# Patient Record
Sex: Female | Born: 1960 | Race: Black or African American | Hispanic: No | Smoking: Former smoker
Health system: Southern US, Community
[De-identification: ages and names within clinical notes are randomized; demographics above are authoritative.]

## PROBLEM LIST (undated history)

## (undated) DIAGNOSIS — I5042 Chronic combined systolic (congestive) and diastolic (congestive) heart failure: Secondary | ICD-10-CM

## (undated) DIAGNOSIS — K219 Gastro-esophageal reflux disease without esophagitis: Secondary | ICD-10-CM

## (undated) DIAGNOSIS — E669 Obesity, unspecified: Secondary | ICD-10-CM

## (undated) DIAGNOSIS — D649 Anemia, unspecified: Principal | ICD-10-CM

## (undated) DIAGNOSIS — Z923 Personal history of irradiation: Secondary | ICD-10-CM

## (undated) DIAGNOSIS — I2510 Atherosclerotic heart disease of native coronary artery without angina pectoris: Secondary | ICD-10-CM

## (undated) DIAGNOSIS — I428 Other cardiomyopathies: Secondary | ICD-10-CM

## (undated) DIAGNOSIS — I219 Acute myocardial infarction, unspecified: Secondary | ICD-10-CM

## (undated) DIAGNOSIS — G4700 Insomnia, unspecified: Secondary | ICD-10-CM

## (undated) DIAGNOSIS — G8929 Other chronic pain: Secondary | ICD-10-CM

## (undated) DIAGNOSIS — I10 Essential (primary) hypertension: Secondary | ICD-10-CM

## (undated) DIAGNOSIS — R0602 Shortness of breath: Secondary | ICD-10-CM

## (undated) DIAGNOSIS — F329 Major depressive disorder, single episode, unspecified: Secondary | ICD-10-CM

## (undated) DIAGNOSIS — R42 Dizziness and giddiness: Secondary | ICD-10-CM

## (undated) DIAGNOSIS — R51 Headache: Secondary | ICD-10-CM

## (undated) DIAGNOSIS — Z8709 Personal history of other diseases of the respiratory system: Secondary | ICD-10-CM

## (undated) DIAGNOSIS — M1990 Unspecified osteoarthritis, unspecified site: Secondary | ICD-10-CM

## (undated) DIAGNOSIS — L309 Dermatitis, unspecified: Secondary | ICD-10-CM

## (undated) DIAGNOSIS — B029 Zoster without complications: Secondary | ICD-10-CM

## (undated) DIAGNOSIS — C50919 Malignant neoplasm of unspecified site of unspecified female breast: Principal | ICD-10-CM

## (undated) DIAGNOSIS — E785 Hyperlipidemia, unspecified: Secondary | ICD-10-CM

## (undated) DIAGNOSIS — J3081 Allergic rhinitis due to animal (cat) (dog) hair and dander: Secondary | ICD-10-CM

## (undated) DIAGNOSIS — M545 Low back pain, unspecified: Secondary | ICD-10-CM

## (undated) DIAGNOSIS — L509 Urticaria, unspecified: Secondary | ICD-10-CM

## (undated) DIAGNOSIS — Z9289 Personal history of other medical treatment: Secondary | ICD-10-CM

## (undated) DIAGNOSIS — F32A Depression, unspecified: Secondary | ICD-10-CM

## (undated) DIAGNOSIS — G709 Myoneural disorder, unspecified: Secondary | ICD-10-CM

## (undated) DIAGNOSIS — J439 Emphysema, unspecified: Secondary | ICD-10-CM

## (undated) DIAGNOSIS — R519 Headache, unspecified: Secondary | ICD-10-CM

## (undated) DIAGNOSIS — R079 Chest pain, unspecified: Secondary | ICD-10-CM

## (undated) DIAGNOSIS — I509 Heart failure, unspecified: Secondary | ICD-10-CM

## (undated) DIAGNOSIS — E039 Hypothyroidism, unspecified: Secondary | ICD-10-CM

## (undated) DIAGNOSIS — F419 Anxiety disorder, unspecified: Secondary | ICD-10-CM

## (undated) DIAGNOSIS — R011 Cardiac murmur, unspecified: Secondary | ICD-10-CM

## (undated) HISTORY — DX: Zoster without complications: B02.9

## (undated) HISTORY — DX: Chest pain, unspecified: R07.9

## (undated) HISTORY — DX: Essential (primary) hypertension: I10

## (undated) HISTORY — DX: Other cardiomyopathies: I42.8

## (undated) HISTORY — DX: Anxiety disorder, unspecified: F41.9

## (undated) HISTORY — DX: Heart failure, unspecified: I50.9

## (undated) HISTORY — DX: Emphysema, unspecified: J43.9

## (undated) HISTORY — DX: Insomnia, unspecified: G47.00

## (undated) HISTORY — DX: Chronic combined systolic (congestive) and diastolic (congestive) heart failure: I50.42

## (undated) HISTORY — DX: Hypothyroidism, unspecified: E03.9

## (undated) HISTORY — PX: BREAST BIOPSY: SHX20

## (undated) HISTORY — DX: Cardiac murmur, unspecified: R01.1

## (undated) HISTORY — DX: Urticaria, unspecified: L50.9

## (undated) HISTORY — DX: Depression, unspecified: F32.A

## (undated) HISTORY — DX: Anemia, unspecified: D64.9

## (undated) HISTORY — PX: ABDOMINAL HYSTERECTOMY: SHX81

## (undated) HISTORY — DX: Dermatitis, unspecified: L30.9

## (undated) HISTORY — DX: Atherosclerotic heart disease of native coronary artery without angina pectoris: I25.10

## (undated) HISTORY — DX: Malignant neoplasm of unspecified site of unspecified female breast: C50.919

## (undated) HISTORY — DX: Dizziness and giddiness: R42

## (undated) HISTORY — DX: Gastro-esophageal reflux disease without esophagitis: K21.9

## (undated) HISTORY — DX: Obesity, unspecified: E66.9

## (undated) HISTORY — DX: Major depressive disorder, single episode, unspecified: F32.9

---

## 1989-03-22 HISTORY — PX: TUBAL LIGATION: SHX77

## 1999-03-23 HISTORY — PX: CARDIAC CATHETERIZATION: SHX172

## 1999-09-09 ENCOUNTER — Inpatient Hospital Stay (HOSPITAL_COMMUNITY): Admission: EM | Admit: 1999-09-09 | Discharge: 1999-09-11 | Payer: Self-pay | Admitting: Emergency Medicine

## 1999-09-09 ENCOUNTER — Encounter: Payer: Self-pay | Admitting: Cardiology

## 1999-09-10 ENCOUNTER — Encounter: Payer: Self-pay | Admitting: *Deleted

## 2001-03-22 DIAGNOSIS — I219 Acute myocardial infarction, unspecified: Secondary | ICD-10-CM

## 2001-03-22 HISTORY — DX: Acute myocardial infarction, unspecified: I21.9

## 2002-12-31 ENCOUNTER — Encounter: Payer: Self-pay | Admitting: Emergency Medicine

## 2003-01-01 ENCOUNTER — Encounter: Payer: Self-pay | Admitting: Internal Medicine

## 2003-01-01 ENCOUNTER — Encounter: Payer: Self-pay | Admitting: Cardiology

## 2003-01-01 ENCOUNTER — Inpatient Hospital Stay (HOSPITAL_COMMUNITY): Admission: EM | Admit: 2003-01-01 | Discharge: 2003-01-05 | Payer: Self-pay | Admitting: Emergency Medicine

## 2003-01-02 ENCOUNTER — Encounter: Payer: Self-pay | Admitting: Internal Medicine

## 2003-01-03 ENCOUNTER — Encounter: Payer: Self-pay | Admitting: Internal Medicine

## 2003-04-10 ENCOUNTER — Ambulatory Visit (HOSPITAL_COMMUNITY): Admission: RE | Admit: 2003-04-10 | Discharge: 2003-04-10 | Payer: Self-pay | Admitting: Gastroenterology

## 2003-05-03 ENCOUNTER — Encounter: Admission: RE | Admit: 2003-05-03 | Discharge: 2003-05-03 | Payer: Self-pay | Admitting: Obstetrics and Gynecology

## 2004-01-07 ENCOUNTER — Emergency Department (HOSPITAL_COMMUNITY): Admission: EM | Admit: 2004-01-07 | Discharge: 2004-01-07 | Payer: Self-pay | Admitting: Emergency Medicine

## 2004-05-14 ENCOUNTER — Ambulatory Visit (HOSPITAL_COMMUNITY): Admission: RE | Admit: 2004-05-14 | Discharge: 2004-05-14 | Payer: Self-pay | Admitting: Gastroenterology

## 2004-06-11 ENCOUNTER — Ambulatory Visit: Payer: Self-pay | Admitting: Internal Medicine

## 2004-06-11 ENCOUNTER — Ambulatory Visit (HOSPITAL_BASED_OUTPATIENT_CLINIC_OR_DEPARTMENT_OTHER): Admission: RE | Admit: 2004-06-11 | Discharge: 2004-06-11 | Payer: Self-pay | Admitting: Gastroenterology

## 2004-08-06 ENCOUNTER — Encounter: Admission: RE | Admit: 2004-08-06 | Discharge: 2004-08-06 | Payer: Self-pay | Admitting: Internal Medicine

## 2004-11-12 ENCOUNTER — Emergency Department (HOSPITAL_COMMUNITY): Admission: EM | Admit: 2004-11-12 | Discharge: 2004-11-12 | Payer: Self-pay | Admitting: Emergency Medicine

## 2004-11-18 ENCOUNTER — Encounter: Admission: RE | Admit: 2004-11-18 | Discharge: 2004-11-18 | Payer: Self-pay | Admitting: Internal Medicine

## 2005-01-19 ENCOUNTER — Encounter (INDEPENDENT_AMBULATORY_CARE_PROVIDER_SITE_OTHER): Payer: Self-pay | Admitting: *Deleted

## 2005-01-19 ENCOUNTER — Inpatient Hospital Stay (HOSPITAL_COMMUNITY): Admission: RE | Admit: 2005-01-19 | Discharge: 2005-01-21 | Payer: Self-pay | Admitting: Obstetrics and Gynecology

## 2005-07-21 ENCOUNTER — Encounter: Admission: RE | Admit: 2005-07-21 | Discharge: 2005-07-21 | Payer: Self-pay | Admitting: Family Medicine

## 2005-07-29 ENCOUNTER — Encounter: Admission: RE | Admit: 2005-07-29 | Discharge: 2005-07-29 | Payer: Self-pay | Admitting: Family Medicine

## 2005-11-09 ENCOUNTER — Encounter: Admission: RE | Admit: 2005-11-09 | Discharge: 2005-11-09 | Payer: Self-pay | Admitting: Orthopedic Surgery

## 2005-11-12 ENCOUNTER — Encounter: Admission: RE | Admit: 2005-11-12 | Discharge: 2005-11-12 | Payer: Self-pay | Admitting: Orthopedic Surgery

## 2005-11-19 ENCOUNTER — Encounter: Admission: RE | Admit: 2005-11-19 | Discharge: 2005-11-19 | Payer: Self-pay | Admitting: Orthopedic Surgery

## 2005-12-06 ENCOUNTER — Encounter: Admission: RE | Admit: 2005-12-06 | Discharge: 2005-12-06 | Payer: Self-pay | Admitting: Orthopedic Surgery

## 2006-04-14 ENCOUNTER — Ambulatory Visit (HOSPITAL_COMMUNITY): Admission: RE | Admit: 2006-04-14 | Discharge: 2006-04-14 | Payer: Self-pay

## 2006-04-26 ENCOUNTER — Ambulatory Visit (HOSPITAL_COMMUNITY): Admission: RE | Admit: 2006-04-26 | Discharge: 2006-04-26 | Payer: Self-pay | Admitting: Cardiology

## 2007-04-10 ENCOUNTER — Encounter: Admission: RE | Admit: 2007-04-10 | Discharge: 2007-04-10 | Payer: Self-pay | Admitting: Family Medicine

## 2009-10-29 ENCOUNTER — Encounter: Admission: RE | Admit: 2009-10-29 | Discharge: 2009-10-29 | Payer: Self-pay | Admitting: Cardiology

## 2010-04-12 ENCOUNTER — Encounter: Payer: Self-pay | Admitting: Orthopedic Surgery

## 2010-08-07 NOTE — Op Note (Signed)
NAMEJANNELLE, [name]                   ACCOUNT NO.:  [id number]   MEDICAL RECORD NO.:  [id number]          PATIENT TYPE:  AMB   LOCATION:  ENDO                         FACILITY:  [facility]   PHYSICIAN:  Danise Edge, M.D.   DATE OF BIRTH:  January 28, 1961   DATE OF PROCEDURE:  05/14/2004  DATE OF DISCHARGE:                                 OPERATIVE REPORT   PROCEDURE INDICATIONS:  Ms. [name] is a 50 year old female born October 11, 1960. Ms. [name] is scheduled to undergo a screening colonoscopy with  polypectomy to prevent colon cancer.   ENDOSCOPIST:  Danise Edge, M.D.   PREMEDICATION:  Versed 9 mg, Demerol 50 mg.   PROCEDURE:  After obtaining informed consent, Ms. [name] was placed in the left  lateral decubitus position. I administered intravenous Demerol and  intravenous Versed to achieve conscious sedation for the procedure. The  patient's blood pressure, oxygen saturation and cardiac rhythm were  monitored throughout the procedure and documented in the medical record.   Anal inspection and digital rectal exam were normal. The Olympus adjustable  pediatric colonoscope was introduced into the rectum and advanced to the  cecum. Colonic preparation for the exam today was excellent.   Rectum normal.   Sigmoid colon and descending colon normal.   Splenic flexure normal.   Transverse colon normal.   Hepatic flexure normal.   Descending colon normal.   Cecum and ileocecal valve normal.   ASSESSMENT:  Normal screening proctocolonoscopy to the cecum.      MJ/MEDQ  D:  05/14/2004  T:  05/14/2004  Job:  [id number]   cc:   Georgann Housekeeper, MD  [address]  [place]  [place] [address]  Fax: [phone]

## 2010-08-07 NOTE — Discharge Summary (Signed)
[name]. [name]  Patient:    [name], [name]                            MRN: [id number] Adm. Date:  [id number] Disc. Date: [id number] Attending:  Darlin Priestly Dictator:   Darcella Gasman. Annie Paras, F.N.P.C. CC:         [name], M.D.  James L. Randa Evens, M.D.   Discharge Summary  DISCHARGE DIAGNOSES: 1. Chest pain secondary to #2.    a. Negative myocardial infarction.    b. No significant coronary artery disease.    c. No evidence of aortic dissection. 2. Streaky gastritis, positive Helicobacter pylori. 3. Left ventricular dysfunction moderately, ejection fraction 30 to 35%. 4. Hypertension. 5. Hypothyroidism. 6. Depression. 7. Iron-deficiency anemia. 8. Mitral valve regurgitation. 9. Hypercholesterolemia.  DISCHARGE CONDITION: Improved.  PROCEDURES: 1. September 10, 1999, combined left heart catheterization by Lenise Herald, M.D. 2. September 11, 1999, EGD by Fayrene Fearing L. Randa Evens, M.D.  DISCHARGE MEDICATIONS: 1. Maxzide 37.5/25 one a day. 2. Levothroid 50 mcg daily. 3. Nu-Iron 150 mg twice a day. 4. Micro-K 10 mEq daily. 5. Lanoxin 0.125 mg daily. 6. Altace 5 mg twice a day. 7. Lopressor 25 mg twice a day.  DISCHARGE INSTRUCTIONS: 1. No strenuous activity, no lifting or five pounds or driving x 2 days.    He may return to work Monday. 2. Low fat, low cholesterol, low salt diet. 3. He may shower. 4. Do not soak in the tub or swim for one week. 5. Call with any problems or questions. 6. See Dr. Jenne Campus in two weeks.  Call for an appointment. 7. See Dr. Randa Evens in three to four weeks. Call his office for an appointment.  HISTORY OF PRESENT ILLNESS:  A 50 year old black married female without prior cardiac history developed mid sternal chest pain on September 08, 1999, after becoming emotionally upset.  She went to bed after taking half a tablet of Zoloft and was awakened at 3 a.m. on September 09, 1999, with mid sternal chest pain.  She took a shower and felt as  if she would pass out.  She lay down on the floor and later that morning she went to Dr. Fayrene Fearing office in [place], [place]. She was given nitroglycerin with relief.  She continues with left arm pain.  She was sent to the emergency room with left arm pain. Nitroglycerin IV was started with resolution of her left arm pain.  PAST MEDICAL HISTORY:  Hypertension, depression, hypothyroidism, hypercholesterolemia and iron-deficiency anemia but no sickle cell disease.  PAST SURGICAL HISTORY:  BTL and C-sections.  ALLERGIES:  No known drug allergies.  OUTPATIENT MEDICATIONS:  Maxzide, levothyroxine and Zoloft p.r.n.  For family history, social history and review of systems, see H&P.  DISCHARGE PHYSICAL EXAMINATION:  GENERAL APPEARANCE:  An alert and oriented black female in no acute distress.  VITAL SIGNS:  Blood pressure 98/58, pulse 76, respiratory rate 20, temperature 97.9, room air oxygen saturation 93%.  SKIN:  Warm and dry with brisk capillary refill.  LUNGS:  Clear to auscultation.  CARDIOVASCULAR:  Regular rate and rhythm.  EXTREMITIES:  Her right groin was stable.  LABORATORY DATA:  Admission labs revealed hemoglobin 7.8, hematocrit 27.1, wbc 3.6, MCV 63.8, platelets 401.  After two units of packed cells, hemoglobin was up to 10.8, hematocrit 34.5 and wbc 7.1; neutrophils 15, lymphs 37, monos 5, eosinophils 5, basophils 2, leukocytes 2.  Protime 13,  INR 1 and PTT 24.  Chemistries revealed sodium 133, potassium on admission 2.8, chloride 99, CO2 27, glucose 94, BUN 11, creatinine 0.8, calcium 9.4, total protein 7.8, albumin 3.8, AST 24, ALT 19, ALP 52, total bilirubin 0.7, magnesium 2.  Prior to discharge her potassium had been up to 4 and it was down to 3.3 on the day of discharge but she was given more supplement.  CKs ranged 181 to 161, MB 0.6 to 0.3, troponin 0.03.  Total cholesterol 195, triglycerides 84, HDL 39, LDL 139.  Iron 15, total iron binding capacity  515, iron saturation 3, B12 682, folate 10, ferritin 3.  Blood type is O positive.  H. pylori:  Urease negative, CLOtest detects urease enzyme with Helicobacter pylori in the gastric mucosa.  Chest x-ray on admission revealed lungs are clear, no acute disease. Follow-up VQ scan showed very low likelihood for PE.  EKG showed sinus rhythm with PACs, nonspecific T-wave abnormalities, no significant change from previous EKG.  September 10, 1999, sinus rhythm, occasional PAC, prolonged QT slight, otherwise normal EKG.  EGD revealed some gastritis and H. pylori was positive.  Cardiac catheterization revealed patent coronary arteries with minimal disease.  See catheterization report for complete details.  EF was down to 30 to 35% and there was mild mitral regurgitation.  There was no evidence of renal artery stenosis and normal aorta.  HOSPITAL COURSE:  [name] was admitted September 09, 1999, by Dr. Jenne Campus for chest pain.  She was brought in and placed on IV heparin, IV nitroglycerin and underwent cardiac catheterization.  Her hemoglobin was low at 7.8. She was transfused two units of packed cells.  Cardiac catheterization showed minimal coronary artery disease, some mitral regurgitation and some left ventricular dysfunction.  VQ scan was negative for PE.  She underwent GI consult with Dr. Randa Evens and an EGD which was positive for H. pylori. She was started on iron and discharged home on September 11, 1999.  FOLLOW-UP:  She will follow up with Dr. Randa Evens and Dr. Jenne Campus. DD:  11/30/99 TD:  12/01/99 Job: [id number]

## 2010-08-07 NOTE — H&P (Signed)
[name], [name]                               ACCOUNT NO.:  [id number]   MEDICAL RECORD NO.:  [id number]                   PATIENT TYPE:  EMS   LOCATION:  ED                                   FACILITY:  [facility]   PHYSICIAN:  Corinna L. Lendell Caprice, MD             DATE OF BIRTH:  [birth year]   DATE OF ADMISSION:  01/01/2003  DATE OF DISCHARGE:                                HISTORY & PHYSICAL   CHIEF COMPLAINT:  1. Stomach hurts.  2. Shortness of breath.   HISTORY OF PRESENT ILLNESS:  [name] is a 50 year old black female patient  of Dr. Fayrene Fearing in [place], who presents to the emergency room with several  complaints, the first of which is constipation for two weeks.  The pain has  gotten progressively worse since then.  She is also bloated.  She had one  episode of vomiting yesterday.  She has no history of hematemesis.  The  second complaint is dyspnea.  She also has chronic orthopnea, two pillows;  this may have been worsened over the past two days.  Also, her legs have  been swollen for two days.  She thinks this may have been due to eating ice  cream; she apparently is allergic to this and had hives two days ago.  She  has had chest pain lasting almost all day today, like a knot in her chest  in the substernal area.  She had a cardiac catheterization done in 2001 with  no significant coronary artery disease but moderate global hypokinesis with  an ejection fraction of 30-35% and mild mitral regurgitation.  The patient  denies history of congestive heart failure.   PAST MEDICAL HISTORY:  1. Hypertension.  2. Hypothyroidism.  3. Depression.  4. Iron deficiency anemia.  5. She had an EGD in 2001 which showed gastritis.   MEDICATIONS:  Norvasc, Synthroid, Paxil and Zoloft, doses all unknown.  She  may take a few other medications which she cannot recall.   PAST SURGICAL HISTORY:  C-section.   ALLERGIES:  She is allergic to PEANUTS and ICE CREAM.   SOCIAL HISTORY:  She smokes six  to eight cigarettes a day.  She is separated  from her husband.  She works as a Airline pilot person.  She drinks a wine cooler  rarely, no drugs.   FAMILY HISTORY:  Her mother died of leukemia.  Her father is on dialysis and  has hypertension.   REVIEW OF SYSTEMS:  CONSTITUTIONAL:  No fevers or chills.  HEENT:  No  headache.  No odynophagia.  No sore throat.  RESPIRATORY:  As above.  She  also has had a dry cough recently.  CARDIOVASCULAR:  As above.  GI:  As  above.  GU:  No dysuria or hematuria.  MUSCULOSKELETAL:  She is experiencing  pain in her legs.  SKIN:  As above.  She went  to the emergency room and was  given Benadryl and steroids for her rash that broke out on her legs after  eating ice cream; this is resolving.  ENDOCRINE:  No diabetes.  She has not  had her TSH checked recently, as far as she can recall.  HEMATOLOGIC:  No  history of PE or DVT.   PHYSICAL EXAMINATION:  VITAL SIGNS:  On physical examination, her oxygen  saturation is 95% on room air, blood pressure is 120/88, heart rate 106,  respiratory rate 20, temperature 98.5.  GENERAL:  In general, the patient is an uncomfortable-appearing black  female.  HEENT:  Normocephalic, atraumatic.  Pupils are equal, round and reactive to  light.  Sclerae nonicteric.  She has moist mucous membranes.  NECK:  Neck is supple.  No JVD.  LUNGS:  Lungs are clear to auscultation bilaterally without wheezes, rhonchi  or rales.  CARDIOVASCULAR:  Fast, regular.  No murmurs, gallops or rubs.  ABDOMEN:  Normal bowel sounds.  Soft.  She does have distention and diffuse  tenderness.  No rebound tenderness.  GU:  Deferred.  RECTAL:  Heme-negative per ER physician.  EXTREMITIES:  She has 1+ pitting edema.  NEUROLOGIC:  The patient is alert and oriented.  Cranial nerves and  sensorimotor exam are intact.  SKIN:  She has faint papular rash which appears to be resolving hives on her  right leg greater than her left.  PSYCHIATRIC:  Normal  affect.   LABORATORIES:  White blood cell count is 8.4, hemoglobin 7.4, hematocrit  26.0, MCV is 62, RDW is 25, platelet count is 335,000.  Complete metabolic  panel is negative for a glucose of 114, SGOT of 120, SGPT of 143, albumin of  3.0, otherwise, unremarkable.  Total CPK is 43, MB fraction is 1.7 with a  troponin of 0.08.  Urine pregnancy negative.  UA shows greater than 300  protein, negative nitrites, negative leukocyte esterase, negative blood.   EKG shows sinus tachycardia with a rate of 106 and nonspecific changes.   Acute abdominal series is a poor film.  I am unable to see the lung apices  and it is over-penetrated, but it looks like she may have an early ileus  versus early small-bowel obstruction and also it looks like she may have  congestive heart failure.   ASSESSMENT AND PLAN:  1. New-onset congestive heart failure:  The patient has had a nonischemic     cardiomyopathy, based on cardiac catheterization in 2001, but no reported     overt congestive heart failure.  I will check an echocardiogram, thyroid-     stimulating hormone, two more sets of cardiac enzymes.  She has received     a dose of intravenous Lasix here in the emergency room; I will continue     this; give aspirin, Nitrol paste, acetylcholinesterase inhibitor and     admit to telemetry.  2. Probable early small-bowel obstruction versus ileus:  The patient will be     nothing-by-mouth (n.p.o.) except for ice chips.  I will give Dulcolax, as     it sounds like she has been constipated for some time and this may be     exacerbated by that.  If she has any vomiting or if the obstruction or     ileus does not improve, she may need an nasogastric tube to suction.  I     will also check a lipase to rule out pancreatitis.  3. Iron deficiency anemia:  Apparently,  this has been worked up previously.     She will get blood transfusion, given her heart failure. 4. Increased liver function tests, probably due to  passive hepatic     congestion.  This will be monitored and if it does not improve, may need     workup.  5. Hypothyroidism:  This may be contributing to her number one and number     two.  We will check a thyroid-stimulating hormone.  6. Hypertension.  7.     Depression:  We will need to get medication information regarding her exact      medications and doses.  I have asked her friend to bring these in.     Otherwise, if this does not occur, she reports that she goes to the [facility] in [place].                                               Corinna L. Lendell Caprice, MD    CLS/MEDQ  D:  01/01/2003  T:  01/01/2003  Job:  [id number]

## 2010-08-07 NOTE — H&P (Signed)
NAMEAMELIYAH, [name]                   ACCOUNT NO.:  [id number]   MEDICAL RECORD NO.:  [id number]          PATIENT TYPE:  AMB   LOCATION:                                FACILITY:  [facility]   PHYSICIAN:  Charles A. Delcambre, MDDATE OF BIRTH:  23-Jul-1960   DATE OF ADMISSION:  04/14/2006  DATE OF DISCHARGE:  04/14/2006                              HISTORY & PHYSICAL   The patient to be admitted on April 14, 2006, to undergo wide local  excision of a vulvar lesion.  This has been present for several months.  It failed hydrocortisone, failed Condylox but she only used this for a  very brief period of time - actually 1day - and it is tender.  There is  no evidence of infection that I can tell on examination.  It is mildly  tender to touch.  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  It is approximately 1.5 x 0.5 x 0.5 cm and it is bigger  than I would want to do in the office with a bit of a margin, and for  this region she is to be admitted to undergo wide local excision.  She  gives informed consent; accepts risks of infection, bleeding, cutaneous  nerve involvement.  All questions are answered and will proceed as  outlined.  Preoperative CBC, BMET, and n.p.o. past 0700 with an  afternoon surgery at approximately 3 p.m. is noted.   PAST MEDICAL HISTORY:  1. Hypertension.  2. Hypothyroidism.  3. Nerves.   SURGICAL HISTORY:  1. C-section x3, tubal ligation at the last surgery.  2. TAH/RSO.  Left ovary remains in situ.   MEDICATIONS:  1. Wellbutrin 150 mg daily.  2. Lasix 40 mg b.i.d.  3. Potassium 20 mEq daily.  4. Levothroid 250 mcg daily.  5. Altace 10 mg daily.  6. Diclofenac 75 mg daily.  7. Nexium 40 mg daily.  8. Phendimetrazine 150 mg daily through a diet doctor.   ALLERGIES:  PENICILLIN - hives.   SOCIAL HISTORY:  No alcohol or drug use.  She is not married and not  sexually active.  She does smoke, about one pack of cigarettes daily.   FAMILY HISTORY:   Denies family history of gynecological cancers or major  illnesses contributing.   REVIEW OF SYSTEMS:  No chest pain, shortness of breath, wheezing,  diarrhea, constipation, urgency, dysuria, headaches.   PHYSICAL EXAMINATION:  GENERAL:  Alert and oriented x3, no distress.  VITAL SIGNS:  Blood pressure 100/70, heart rate 72, respirations 20.  HEENT:  Grossly within normal limits.  NECK:  Supple without thyromegaly or adenopathy.  LUNGS:  Clear bilaterally.  HEART:  Regular rate and rhythm without murmur, rub or gallop.  BREASTS:  Deferred.  ABDOMEN:  No masses palpable.  PELVIC:  Normal external female genitalia as far as architecture.  In  the left groin fold posteriorly there is a 1.5- to 2-cm lesion raised  about 0.5 cm and about 0.5 cm in width as noted in the history of  present illness, consistent with biopsy site.  Rectal exam is not done.  Anus and perineal body appear normal.   ASSESSMENT:  Vulvar lesion.   PLAN:  Wide local excision.  Plan outlined as noted above.      Charles A. Sydnee Cabal, MD  Electronically Signed     CAD/MEDQ  D:  04/05/2006  T:  04/05/2006  Job:  [id number]

## 2010-08-07 NOTE — Procedures (Signed)
[place]  Patient:    [name], [name]                            MRN: [id number] Proc. Date: 09/11/99 Adm. Date:  [id number] Disc. Date: [id number] Attending:  Lenise Herald H CC:         Lenise Herald, M.D.             Andres Ege, M.D. [place], [place]                           Procedure Report  PROCEDURE:  Esophagogastroduodenoscopy.  MEDICATIONS:  Hurricaine spray, Fentanyl 70 mcg, Versed 6 mg IV.  ENDOSCOPIST: Llana Aliment. Randa Evens, M.D.  INDICATIONS:  Profound iron-deficiency anemia with atypical chest pain. This procedure is done in an attempt to further evaluate the anemia, as well as to look for upper GI source of her chest pain.  DESCRIPTION OF PROCEDURE:  The procedure was explained to the patient and consent was obtained. The patient was placed in the left lateral decubitus position. The Olympus video endoscope was inserted into the esophagus under direct visualization. The stomach and pylorus was identified and passed. The duodenum, including the bulb and the second portion were seen well and unremarkable. No ulceration or inflammation. The scope was withdrawn back into the stomach. The antrum revealed diffuse streaky gastritis. The fundus and cardia seen on retroflex view. There were no ulcerations. No other significant abnormalities. Biopsy of the antrum was sent for rapid urease test for Helicobacter. The scope was withdrawn back into the esophagus. The GE junction was patent. The distal and proximal esophagus were endoscopically normal. There is no ulceration or any other significant inflammation. The scope was withdrawn and the patient tolerated the procedure well.  ASSESSMENT: 1. Streaky gastritis, otherwise normal endoscopy.  PLAN: 1. We will check results of test for Helicobacter and if positive, will treat    aggressively. The presence of Helicobacter has been associated with marked    impairment of iron absorption. 2.  Aggressive iron replacement orally. 3. Would have the patient see me in the office in three to four weeks to    arrange an outpatient colonoscopy. 4. We will start Nexium one tablet daily upon discharge. DD:  09/11/99 TD:  09/15/99 Job: [id number]

## 2010-08-07 NOTE — Consult Note (Signed)
[name], [name]                               ACCOUNT NO.:  [id number]   MEDICAL RECORD NO.:  [id number]                   PATIENT TYPE:  INP   LOCATION:  0374                                 FACILITY:  [facility]   PHYSICIAN:  Sharlet Salina T. Hoxworth, M.D.          DATE OF BIRTH:  03-24-1960   DATE OF CONSULTATION:  01/01/2003  DATE OF DISCHARGE:                                   CONSULTATION   CHIEF COMPLAINT:  Abdominal pain.   HISTORY OF PRESENT ILLNESS:  I was asked by Georgann Housekeeper, M.D. to evaluate  [name].  She is a 50 year old black female who was admitted yesterday  evening to [facility] with complaints of abdominal pain and  shortness of breath.  Ms. Hamme states that approximately three to four days  prior to this admission she developed the onset of epigastric abdominal  pain.  She describes a pressure like pain felt in her epigastrium without  radiation.  This came on fairly quickly.  The pain has persisted since that  time.  She also has had some shortness of breath over the same period.  She  has been somewhat nauseated, but without vomiting over the last several  days.  She did have an episode of vomiting about a week ago prior to the  onset of the pain.  She has not had any fever or chills.  No cough.  She  denies any urinary burning, frequency.  She has felt constipated over about  the past week and generally bloated.  She has had small bowel movements and  has noted a little bit of blood on a couple occasions with straining.  She  also has noticed some increased swelling of her ankles.  She apparently had  some itching and hives about two days ago after the onset of the pain and  was seen in emergency room at [facility] and was treated with Benadryl  and some laxatives.  Her pain, however, continue in her epigastrium which  was her chief problem and she presented to [facility].   PAST SURGICAL HISTORY:  Cesarean section.   PAST MEDICAL  HISTORY:  1. She had a cardiac catheterization in 2001 for chest pain with normal     coronaries, but global hypokinesia with an ejection fraction of about     35%.  2. She is followed for hypertension.  3. Hypothyroidism.  4. Depression.  5. History of anemia.  6. Gastritis.   MEDICATIONS:  She is not certain of dosage, but apparently takes Norvasc,  Zestril, Lasix, Synthroid, and Zoloft.   ALLERGIES:  No drug allergies but she states she is allergic to PEANUTS and  ICE CREAM.   SOCIAL HISTORY:  She is separated.  Smokes occasional cigarettes; drinks  alcohol rarely.  She is employed.   FAMILY HISTORY:  Positive for leukemia, renal failure, hypertension in her  parents.  REVIEW OF SYSTEMS:  GENERAL:  Denies fever, chills, weight change.  RESPIRATORY:  She has had some increasing shortness of breath as above over  the past week, also nonproductive cough.  CARDIOVASCULAR:  Denies chest  pain, palpitations.  GASTROINTESTINAL:  As above.  EXTREMITIES:  She has  noted some increased swelling of her lower extremities.   PHYSICAL EXAMINATION:  VITAL SIGNS:  She is afebrile.  Pulse is 100 and  regular, blood pressure 109/72, respirations 18.  GENERAL:  She is a mildly obese black female who appears uncomfortable, but  not severely ill.  SKIN:  Warm and dry.  No rash or infection.  HEENT:  Eyes:  Sclerae not icteric.  Pupils equal and reactive.  Oropharynx  clear without exudates.  NECK:  No palpable masses or thyromegaly.  LYMPH NODES:  No cervical, supraclavicular, axillary nodes palpable.  LUNGS:  Clear to auscultation.  CARDIAC:  Regular tachycardia.  I did not hear any murmurs.  There is 1+  ankle edema.  ABDOMEN:  Mild distention.  Bowel sounds are present.  There is a healed low  midline incision without hernias.  She is quite tender in the epigastrium  and right upper quadrant with guarding.  I cannot feel any masses or  hepatosplenomegaly.  EXTREMITIES:  No joint  swelling or deformity.  NEUROLOGIC:  Alert, fully oriented.  Motor, sensory examination is grossly  normal.   LABORATORIES:  White count 8.4, hemoglobin 9.6.  Electrolytes, BUN,  creatinine normal.  LFTs show moderate elevation of AST and ALT at 120 and  143, respectively.  Bilirubin and alkaline phosphatase normal.  Troponins  mildly elevated at 0.06-0.08.  CPK-MBs negative.  Urinalysis negative except  for greater than 300% protein.   Flat and upright abdomen and chest x-ray were obtained.  Abdominal films  were unremarkable with normal bowel gas pattern.  There may be some slight  interstitial edema on chest x-ray.  CT scan of the abdomen and pelvis was  obtained.  The most notable finding is a prominent gallbladder with some  surrounding fluid.  No definite stones.  There are small bilateral effusions  and moderate cardiomegaly.  There is some pelvic fluid and small ovarian  cysts.   ASSESSMENT/PLAN:  A 50 year old black female with acute epigastric abdominal  pain of three to four days' duration.  The location of her pain, tenderness,  moderately elevated LFTs, and CT scan all suggest the possibility of acute  cholecystitis.  She appears to have some possible mild congestive failure  and history of hypokinesis on cardiac catheterization.  I cannot rule out  passive congestion of the liver as a cause for her symptoms.  The patient is  being seen by cardiology.  She has a HIDA scan ordered and I agree this  should help sort this problem out.  If the patient does require  cholecystectomy, she would certainly require a cardiac clearance prior to  general anesthesia.   Will follow with you.                                               Lorne Skeens. Hoxworth, M.D.    Tory Emerald  D:  01/01/2003  T:  01/01/2003  Job:  [id number]

## 2010-08-07 NOTE — H&P (Signed)
[name], [name]                   ACCOUNT NO.:  [id number]   MEDICAL RECORD NO.:  [id number]           PATIENT TYPE:   LOCATION:                                 FACILITY:   PHYSICIAN:  Charles A. Sydnee Cabal, MD    DATE OF BIRTH:   DATE OF ADMISSION:  01/19/2005  DATE OF DISCHARGE:                                HISTORY & PHYSICAL   CHIEF COMPLAINT:  Menorrhagia.   HISTORY OF PRESENT ILLNESS:  This 50 year old para 3, 0, 0, 3, status post  cesarean section x3, tubal ligation, with menorrhagia and hemoglobin down to  the 9.1 range, is tolerating some iron to build up in the upper 9 range. She  was to be scheduled for endometrial ablation, however, sonohysterogram  revealed that she had a very thin scar and for this reason contraindication  to ablation was noted. She now wishes to go one with hysterectomy secondary  to continued ongoing bleeding, failing progestin therapy and suppression of  her cycling.   PAST MEDICAL HISTORY:  1.  Hypertension.  2.  Hypothyroidism.  3.  Nerves.   PAST SURGICAL HISTORY:  C-section x3, tubal ligation at the last surgery.   MEDICATIONS:  1.  Wellbutrin 150 mg daily.  2.  Lasix 40 mg b.i.d.  3.  Potassium 20 daily.  4.  Levothroid 250 mcg daily.  5.  Altace 10 mg daily.  6.  Diclofenac 75 mg daily.  7.  Iron pill 2 daily.  8.  Nexium 40 mg daily.  9.  Phendimetrazine 150 mg daily (through a diet doctor).   ALLERGIES:  PENICILLIN, questionable reaction causing hives.   SOCIAL HISTORY:  She smokes about a pack of cigarettes daily. No alcohol or  drug use or STD exposure in the past. She is not married. Not sexually  active.   FAMILY HISTORY:  Denies family history of gynecological cancers. No major  illnesses.   REVIEW OF SYSTEMS:  She feels weak when her iron gets down, otherwise denies  fever, chills, nausea, vomiting, diarrhea, constipation, headaches, chest  pain, shortness of breath, wheezing, abdominal pain, bleeding, melena,  hematochezia, urgency, frequency, dysuria, incontinence, hematuria,  galactorrhea or emotional changes.   PHYSICAL EXAMINATION:  GENERAL:  Alert and oriented x3.  VITAL SIGNS:  Blood pressure 150/90, pulse 80, respirations 18.  HEENT EXAM:  Grossly within normal limits.  NECK:  Supple without thyromegaly or adenopathy.  LUNGS:  Clear bilaterally.  BACK:  No CVA tenderness, vertebral column nontender to palpation.  HEART:  Regular rate and rhythm without murmur, rub, gallop.  BREASTS:  No masses, tenderness, discharge. Skin with changes bilaterally.  ABDOMEN:  Soft, flat, nontender, no hepatosplenomegaly or other masses  noted. No hernias.  LYMPH NODES:  No supraclavicular, infraclavicular, groin, axillary,  cervical, postauricular or submandibular nodes are palpable.  PELVIC:  Normal external female genitalia. BUS within normal limits.  Ureteral meatus normal, urethra normal. Bladder normal. Vagina with slight  amount of dark mucoid material consistent with spotting is present. Cervix  multiparous, no cervical motion tenderness is present. Pap was normal.  On  bimanual examination uterus is 8-10 weeks size, slightly irregular,  consistent with small leiomyomata. Slightly deviated over to the left,  immobile and nontender. Adnexal regions are nontender without masses  bilaterally.  I could not palpate the ovaries secondary to patient's body habitus, no  masses are palpable.  RECTAL EXAM:  Was not done. External hemorrhoids are not present. Anus and  perineal body appeared normal.   ASSESSMENT:  Menorrhagia unresponsive to progestins and patient not a  candidate for ablation, now wishing to go on with definitive therapy via  hysterectomy as last resort with conservation of ovaries. She gives informed  consent and accepts risks of infection, bleeding, bowel or bladder damage,  blood product risk including hepatitis and TB exposure, ureteral damage,  incision infection, DVT, general  anesthetic risks. All questions are  answered. She gives informed consent. We will obtain a preoperative CBC,  chemistry profile, type and screen. EKG per anesthesia. T.E.D.'s will be  used peri operatively.  Since she is allergic to penicillin, prophylaxis  with clindamycin 900 mg plus gentamycin 0.5 mg/kg or 120 mg, whichever is  less, will be given preoperatively. All questions were answered and we will  proceed as outlined. She will take her morning medications but sip of water  and otherwise remain n.p.o. past midnight.  We will proceed as outlined.      Charles A. Sydnee Cabal, MD  Electronically Signed     CAD/MEDQ  D:  12/30/2004  T:  12/30/2004  Job:  [id number]

## 2010-08-07 NOTE — Procedures (Signed)
NAMEAISHA, [name]                   ACCOUNT NO.:  [id number]   MEDICAL RECORD NO.:  [id number]          PATIENT TYPE:  OUT   LOCATION:  SLEEP CENTER                 FACILITY:  [facility]   PHYSICIAN:  Clinton D. Maple Hudson, M.D. DATE OF BIRTH:  Jun 21, 1960   DATE OF STUDY:  06/11/2004                              NOCTURNAL POLYSOMNOGRAM   REFERRING PHYSICIAN:  Dr. Corliss Marcus.   INDICATIONS FOR STUDY:  Hypersomnia with sleep apnea. Epworth sleepiness  score 10/24, BMI 38, weight 178 pounds. This study was keyed in by the  admissions office as being requested by Dr. Carman Ching. The available  medical records are from Dr. Corliss Marcus referring to management of  cardiomyopathy and specifically plans to arrange a sleep study. The report  will be directed to Dr. Amil Amen for his direction and re-direction if  appropriate.   SLEEP ARCHITECTURE:  Total sleep time 426 minutes with sleep efficiency of  82%. Stage I was 11%, stage II was 71%, stages III and IV were 10%, REM was  8% of total sleep time. Sleep latency was 25 minutes. REM latency 207  minutes. Awake after sleep onset 65 minutes. Arousal index increased at 76.  She took Paxil at h.s., but no other medications.   RESPIRATORY DATA:  Respiratory disturbance index (RDI) 1.8 obstructive  events per hour which is within normal limits. There were a total of 13  hypopneas. Most events were reported partially sleeping on her back or left  side. REM RDI was 0.   OXYGEN DATA:  Mild snoring with oxygen desaturation to a nadir of 91%. Mean  oxygenation through the study was 96% on room air.   CARDIAC DATA:  Normal sinus rhythm with occasional PVC.   MOVEMENT/PARASOMNIA:  A total of 192 limb jerks were recorded of which 51  were associated with arousal or awakening for a periodic limb movement with  arousal index of 7.2 per hour which is increased. Bathroom times one.   IMPRESSION/RECOMMENDATIONS:  1.  Occasional sleep disordered breathing  events, hypopneas, RDI of 1.8 per      hour  which is within normal limits. No specific therapy indicated.  2.  Periodic limb movement with arousal, 7.2 per hour. This might be enough      to justify of trial of therapy with      either clonazepam or Requip at bedtime if clinically appropriate.      Recognize that some antidepressants are associated with increased      periodic limb movement and restless leg complaints.      CDY/MEDQ  D:  06/14/2004 13:26:35  T:  06/15/2004 10:54:14  Job:  [id number]

## 2010-08-07 NOTE — Consult Note (Signed)
[name], [name]                               ACCOUNT NO.:  [id number]   MEDICAL RECORD NO.:  [id number]                   PATIENT TYPE:  INP   LOCATION:  0374                                 FACILITY:  [facility]   PHYSICIAN:  Colleen Can. Deborah Chalk, M.D.            DATE OF BIRTH:  08/24/1960   DATE OF CONSULTATION:  DATE OF DISCHARGE:                                   CONSULTATION   HISTORY:  Ms. Ditto is a 50 year old black female with multiple medical  problems who presents with nausea, vomiting, constipation as well as  shortness of breath. She has known LV dysfunction for a catheterization in  2001 when she had normal coronary arteries and moderate left ventricular  dysfunction. She has had no cardiology followup. She has a three day history  of chest tightness with increasing dyspnea and cough, increasing edema,  bloating and noted to have some degree of congestive heart failure as well  as ileus and anemia on presentation.   PAST MEDICAL HISTORY:  She had catheterization in 2001, minimal to no  coronary disease with an EF of 30-35%, she has had iron deficiency anemia,  hypertension, tobacco abuse which is ongoing, hypothyroidism, depression, a  history of cesarean section.   ALLERGIES:  No known drug allergies.   MEDICATIONS:  1. Aspirin.  2. Zestril 2.5 mg daily.  3. Lasix 40 mg.  4. Synthroid.   FAMILY HISTORY:  Father is on dialysis and living, mother died of leukemia.   SOCIAL HISTORY:  She is a smoker, separated, employed as a Immunologist, rare  alcohol.   REVIEW OF SYMPTOMS:  She has had no prior chest pain or shortness of breath  until three days prior to admission. No syncope, no fever, feels cold. She  has had a cough, she has had abdominal tenderness as well as edema.   PHYSICAL EXAMINATION:  GENERAL:  She does have jugular venous distention.  She is short of breath but talking.  VITAL SIGNS:  Blood pressure 109/72, heart rate 102, respiratory rate 20, O2  saturations 100%.  SKIN:  Warm and dry, color is normal.  LUNGS:  Show faint rales at the bases.  HEART:  Shows tachycardia. There is a gallop cadence but not really an S3.  No murmur.  ABDOMEN:  Tender and somewhat distended.  EXTREMITIES:  With edema.  NEUROLOGIC:  No gross defects.   LABORATORY DATA:  On admission, hematocrit was 26 and increased recently to  30 post transfusion. Cardiac enzymes were negative, B peptide is 693, TSH is  0.94. Chemistries were basically normal. Glucose is 114. EKG shows sinus  tachycardia with nonspecific ST and T wave changes. Chest x-ray showed  interstitial edema.    IMPRESSION:  1. Some degree of congestive heart failure in setting of known LV     dysfunction.  2. Ileus.  3. Anemia improved with transfusions.  4.  Minimal coronary disease.  5. Hypertension, controlled.  6. History of ongoing tobacco abuse.   PLAN:  2-D echo was performed which showed left ventricular dysfunction with  moderate degree of mitral regurgitation and moderate tricuspid  regurgitation. There is left atrial enlargement. Will try to increase the  diuretics. She will need to be treated for heart failure, will consider low  dose beta blocker in addition to ACE inhibitor.                                               Colleen Can. Deborah Chalk, M.D.    SNT/MEDQ  D:  01/01/2003  T:  01/01/2003  Job:  [id number]   cc:   Kizzie Furnish, M.D.  [address]  [place]  [place] [address]  Fax: [phone]   Janae Bridgeman. Eloise Harman., M.D.  [address]  [place]  [place] [address]  Fax: [phone]   Georgann Housekeeper, M.D.  [address]  [place]  [place] [address]  Fax: [phone]   Lorne Skeens. Hoxworth, M.D.  [address]  [place]  [place] [address]  Fax: [phone]

## 2010-08-07 NOTE — Cardiovascular Report (Signed)
[facility]  Patient:    [name], [name]                            MRN: [id number] Proc. Date: 09/10/99 Adm. Date:  [id number] Disc. Date: [id number] Attending:  Darlin Priestly CC:         Kizzie Furnish, M.D.                        Cardiac Catheterization  PROCEDURES: 1. Left heart catheterization. 2. Coronary angiography. 3. Left ventriculogram. 4. Ascending aortography. 5. Descending aortogram.  SURGEON:  Lenise Herald, M.D.  COMPLICATIONS:  None.  INDICATION:  Ms. Colquhoun is a 50 year old black female, patient of Dr. Delories Heinz in [place], [place], with a history of hypertension and hypercholesterolemia, as well as tobacco abuse, and positive family history of of a CAD.  The patient presented to Dr. Fayrene Fearing office on September 09, 1999 complaining of substernal chest and left arm pain which originally started on the evening of the 19th, progressed through the 9th, and became more severe at approximately 3 a.m. on September 09, 1999.  She was seen by Dr. Fayrene Fearing with diffuse T-wave changes which was unchanged from prior EKGs and given sublingual nitroglycerin with relief for symptoms.  She is now referred to [facility] for further evaluation of her chest pain.  DESCRIPTION OF PROCEDURE:  After giving informed consent, the patient was brought to the cardiac catheter lab where right and left groin was shaved, prepped and draped in the usual sterile fashion.  ECG monitoring was established.  Using modified Seldinger technique, a #6 arterial sheath was inserted in the right femoral artery.  A 6 French diagnostic catheter was used to perform diagnostic angiography.  This reveals a large left main with no significant disease.  The LAD is a large vessel which coursed to the apex and gave rise to one diagonal branch.  The LAD has no significant disease.  The first diagonal was a medium sized vessel which bifurcates distally and has no  significant disease.  The left circumflex is a medium size vessel which coursed in the AV groove and gave rise to three to obtuse marginal branches.  The AV circumflex has no significant disease.  The first OM is a medium size vessel with no significant disease.  The second OM is a medium size vessel with no significant disease. The third OM is a large vessel which bifurcates in the proximal portion and has no significant disease.  The right coronary artery is a large vessel which is dominant and gives rise to both PDA as well as posterior lateral branch.  The RCA is noted to have a distal 20% stenotic lesion with no further significant stenosis.  The PDA and posterior lateral branch has no significant disease.  The left ventriculogram reveals moderate global hypokinesis with an estimated EF of 30-35%.  There is mild mitral regurgitation.  Ascending aortography reveals no evidence of aortic insufficiency and no evidence of aortic dissection.  Descending aortography reveals no evidence of renal artery stenosis with a normal distal aorta.  HEMODYNAMIC DATA: 1. Systemic arterial pressure 121/76. 2. LV pressure 120/12. 3. LV/DP of 20.  IMPRESSION: 1. No significant coronary artery disease. 2. Moderate left ventricular dysfunction with wall motion abnormalities noted    above. 3. Mild mitral regurgitation. 4. No evidence of aortic dissection. 5. No renal artery stenosis.DD:  09/10/99 TD:  09/13/99 Job: [id number]

## 2010-08-07 NOTE — Discharge Summary (Signed)
NAMELUMA, CLOPPER                   ACCOUNT NO.:  [id number]   MEDICAL RECORD NO.:  [id number]          PATIENT TYPE:  INP   LOCATION:  9310                          FACILITY:  [facility]   PHYSICIAN:  Charles A. Delcambre, MDDATE OF BIRTH:  12/14/1960   DATE OF ADMISSION:  01/19/2005  DATE OF DISCHARGE:  01/21/2005                                 DISCHARGE SUMMARY   PRIMARY DISCHARGE DIAGNOSES:  Menorrhagia.   ADDITIONAL DIAGNOSES:  1.  Right ovarian atypical cyst clinically.  2.  Pelvic adhesive disease.   PROCEDURE:  1.  Transabdominal hysterectomy.  2.  Right salpingo-oophorectomy.  3.  Lysis of adhesions.   DISPOSITION:  Patient discharged home to follow up in the office on January 25, 2005 to discontinue staples.  She was given convalescing instructions and  notify of temperature over 100 degrees, increased pain or bleeding,  increased drainage from the incision, erythema of the incision, no lifting  greater than 25 pounds for one month, no driving for two weeks, shower for  two weeks, bathing thereafter.  No sexual penetration or vagina or other  penetration of the vagina for one month.  A prescription for Percocet 5/325  one to two p.o. q.4h. p.r.n. #40 and at her request specifically to continue  a prescription that she has, but no refills from PCP, Seroquel 100 mg p.o.  q.h.s. for sleep #20, thereafter to be refilled from the PCP, Dr. Elenora Gamma at [place].   LABORATORIES:  Postoperative hematocrit 24.2, hemoglobin 7.7.  Chem profile:  Sodium 135, potassium 4.9, chloride 104, CO2 25, BUN 7, creatinine 0.8,  glucose 120.  Pathology is pending.   HISTORY AND PHYSICAL:  As dictated on the chart.   HOSPITAL COURSE:  Patient was admitted.  Underwent surgery as noted above.  Postoperatively patient had routine postoperative course.  She voided  without difficulty after catheter was discontinued with good urine output  overnight postoperative day one.  She did not like the  PCA and this was  since discontinued early in the morning hours of postoperative day one.  She  tolerated p.o. medication with Percocet and ibuprofen.  She ambulated  without difficulty and tolerated a general diet with return of flatus  postoperative day one.  She continued to do well postoperative day #2 and  therefore was discharged home having been started back on medications from  home postoperative day #2.      Charles A. Sydnee Cabal, MD  Electronically Signed    CAD/MEDQ  D:  01/21/2005  T:  01/21/2005  Job:  [id number]

## 2010-08-07 NOTE — Discharge Summary (Signed)
[name], MELENA                               ACCOUNT NO.:  [id number]   MEDICAL RECORD NO.:  [id number]                   PATIENT TYPE:  INP   LOCATION:  0374                                 FACILITY:  [facility]   PHYSICIAN:  Georgann Housekeeper, M.D.                 DATE OF BIRTH:  10-Nov-1960   DATE OF ADMISSION:  12/31/2002  DATE OF DISCHARGE:  01/05/2003                                 DISCHARGE SUMMARY   CONDITION ON DISCHARGE:  Stable.   DISCHARGE DIAGNOSES:  1. Congestive heart failure, nonischemic cardiomyopathy.  2. Ruled out myocardial infarction.  3. Bronchitis.  4. Abdominal pain, possible ileus, resolved.  5. Iron-deficient anemia.  6. Hypertension.   DISCHARGE MEDICATIONS:  1. Synthroid 0.05 mg once a day.  2. Lasix 40 mg daily.  3. Potassium 20 mEq daily.  4. Aspirin 81 mg daily.  5. Prilosec 20 mg daily.  6. Tussionex cough syrup at night p.r.n.  7. Albuterol MDI p.r.n.  8. Diovan 80 mg once a day.   DIET:  Low-salt diet.   FOLLOW-UP:  Dr. Fayrene Fearing in [place] or, if he is not available, with me, in one  to two weeks.   CONSULTATIONS:  1. Cardiology consultation.  2. Surgery consultation.   Transfusion of blood, two units.   HOSPITAL COURSE:  Please see H&P for details.  A 50 year old African-  American female, history of hypertension, congestive heart failure,  hypothyroidism, iron-deficient anemia, admitted with dyspnea, abdominal  pain, constipation.  Originally being followed by Dr. Fayrene Fearing in [place].   Problem 1.  CARDIAC:  The patient was found to have CHF on a chest x-ray and  EKG with was nonspecific.  The patient was admitted to telemetry, ruled out  for a myocardial infarction by enzymes.  EKG remained unremarkable.  Her BNP  initially was 693.  The patient had a cardiology consult, which followed  along with me.  The patient was diuresed with IV Lasix, responding very  well.  Shortness of breath improved.  She had had a catheterization in 2001,  which showed no significant CAD, an EF of 30-35%.  As far as the patient had  echocardiogram, which showed an EF of 30-35%.  As far as the blood pressure,  it remains normal and cardiology has not suggested any more cardiac workup  at this point.  Continue medical management.  The patient was continued on  Lasix and ACE inhibitor was added because of the significant cough, and she  was continued and put on Diovan.   Problem 2.  COUGH AND SOME CONGESTION:  Repeat chest x-ray suggested some  atelectasis.  The patient had some yellow mucus, and her white count was  mildly elevated at 11,000 and was started on azithromycin, which finished a  Z-Pak course and also was given some albuterol and Tussionex.  She had a  history that she  is a smoker and had improvement in her congestion.   Problem 3.  ABDOMINAL PAIN:  The patient had a history of constipation, and  initial KUB suggested a questionable early ileus, no clear obstruction.  The  patient was started on NPO and then clear liquid diet.  Surgery was  consulted.  She had abdominal-pelvic CT, which was unremarkable except for a  prominent gallbladder wall and some fluid around it.  She had normal LFTs.  The patient's abdominal pain improved, but she has a persistent upper  abdominal discomfort with coughing, underwent an ultrasound to look at the  gallbladder, which did not show any gallstones.  Also had a HIDA scan, which  was a functional study, was normal.  Surgery did not think it was any  surgical issue.  Her epigastric discomfort improved.  She was put on  Protonix.  She was guaiac-negative from below.  No evidence of any active GI  bleed.   Problem 4.  ANEMIA:  She came in with a hemoglobin of 7.4.  She had low iron  levels and low MCV.  The patient transfused two units.  Her hemoglobin  improved from 10 and then at time of discharge was 11.4.  White count  remained normal.  She was told to start iron back, two a day.  Her   constipation, she was put on Miralax, which responded very well.  The  patient tolerated a regular diet well with low salt and her other labs,  including renal function and electrolytes, remained stable.                                                   Georgann Housekeeper, M.D.    Arliss Journey  D:  01/17/2003  T:  01/17/2003  Job:  [id number]

## 2010-08-07 NOTE — Op Note (Signed)
[name], [name]                             ACCOUNT NO.:  [id number]   MEDICAL RECORD NO.:  [id number]                   PATIENT TYPE:  AMB   LOCATION:  ENDO                                 FACILITY:  [facility]   PHYSICIAN:  Danise Edge, M.D.                DATE OF BIRTH:  Aug 15, 1960   DATE OF PROCEDURE:  04/10/2003  DATE OF DISCHARGE:                                 OPERATIVE REPORT   PROCEDURE:  Esophagogastroduodenoscopy.   INDICATIONS FOR PROCEDURE:  Ms. [name] is a 50 year old female born Mar 08, 1961.  Ms. Winborne has chronic gastroesophageal reflux.  She is experiencing  dysphagia localized to the subxiphoid area when she takes pills.   CURRENT MEDICATIONS:  Lasix, Synthroid, potassium chloride, aspirin,  Prilosec, Tussionex, albuterol, Diovan, Paxil.   PAST MEDICAL HISTORY:  1. Heart failure secondary to a nonischemic cardiomyopathy.  2. Guaiac negative stool with iron deficiency anemia.  3. Normal gallbladder ultrasound and hepatobiliary scan.  4. Gastroesophageal reflux.  5. Hypothyroidism.  6. Depression.  7. Remote cesarean section.  8. Peanut allergy.  9. Diovan associated angioedema.   ENDOSCOPIST:  Danise Edge, M.D.   PREMEDICATION:  Versed 7.5 mg, Demerol 50 mg.   DESCRIPTION OF PROCEDURE:  After obtaining informed consent, Ms. [name] was  placed in the left lateral decubitus position on the fluoroscopy table.  I  administered intravenous Demerol and intravenous Versed to achieve conscious  sedation for the procedure. The patient's blood pressure, oxygen saturation  and cardiac rhythm were monitored throughout the procedure and documented in  the medical record.   The Olympus gastroscope was passed through the posterior hypopharynx into  the proximal esophagus without difficulty. I did not visualize the vocal  cords.   ESOPHAGOSCOPY:  The proximal, mid and lower segments of the esophageal  mucosa appeared completely normal. The squamocolumnar junction  and  esophagogastric junction are noted at 40 cm from the incisor teeth. There is  no esophageal obstruction. There is no endoscopic evidence for the presence  of esophageal mucosal stricture formation, Barrett's esophagus, erosive  esophagitis or esophageal tumors.   GASTROSCOPY:  Endoscopically there is no evidence for a hiatal hernia.  Retroflexed view of the gastric cardia and fundus was normal. The gastric  body, antrum and pylorus appear normal except for some red streaks in the  gastric antrum.   DUODENOSCOPY:  The duodenal bulb and descending duodenum appear normal.   ASSESSMENT:  Normal esophagogastroduodenoscopy.  Esophageal dilation was not  performed.   RECOMMENDATIONS:  Continue to treat gastroesophageal reflux.  Use p.r.n.  sublingual nitroglycerin or hyoscyamine to treat probable esophageal  motility disorder.  If no improvement schedule esophageal manometry to rule  out achalasia (the esophagus was not dilated and does not have the  appearance of a patient with chronic achalasia).  Danise Edge, M.D.    MJ/MEDQ  D:  04/10/2003  T:  04/10/2003  Job:  [id number]   cc:   Georgann Housekeeper, M.D.  [address]  [place]  [place] [address]  Fax: [phone]

## 2010-08-07 NOTE — Consult Note (Signed)
[facility]  Patient:    [name], [name]                            MRN: [id number] Proc. Date: 09/10/99 Adm. Date:  [id number] Attending:  Lenise Herald H CC:         Lenise Herald, M.D.             Kizzie Furnish, M.D.                          Consultation Report  REASON FOR CONSULTATION:  Iron-deficiency anemia.  HISTORY OF PRESENT ILLNESS:  A 50 year old black female patient of Kizzie Furnish, M.D., who came to the emergency room with chest pain.  At that time she was somewhat emotionally upset.  She has had nitroglycerin with some relief of her pain.  Concern was that she was having an MI but this has been ruled out with serial EKGs and cardiac enzymes.  Cardiac catheterization today revealed no significant coronary artery disease.  During part of her work-up she was discovered to be markedly anemic with a hemoglobin of 7.8, MCV of 64, markedly low iron and ferritin.  The patient has not seen any blood in her stool, had any ulcers or previous GI bleeding.  She had not had any real change in bowel habits.  She has had very heavy menses.  We were asked to see her regarding her iron-deficiency anemia.  MEDICATIONS ON ADMISSION:  Maxzide, Levothroid, Zoloft.  ALLERGIES:  NO KNOWN DRUG ALLERGIES.  PAST MEDICAL HISTORY:  History of depression, hypothyroidism, high cholesterol, hypertension.  She apparently has also had a history of anemia in the past.  PAST SURGICAL HISTORY:  Bilateral tubal ligation and cesarean section.  FAMILY HISTORY:  Negative for GI cancer, positive for coronary disease.  SOCIAL HISTORY:  She is married and has three children.  She smokes but does not drink.  PHYSICAL EXAMINATION:  GENERAL APPEARANCE:  A somnolent black female in no acute distress.  VITAL SIGNS:  The patient is afebrile with blood pressure of 150/90.  HEENT:  Eyes: Sclerae are nonicteric.  Extraocular movements intact.  LUNGS:   Clear.  CARDIOVASCULAR:  Regular rate and rhythm without murmurs or gallops.  ABDOMEN:  Soft and nontender.  RECTAL:  Examination was not repeated.  Hemoccult was negative by the P.A.  ASSESSMENT: 1. Iron-deficiency anemia - etiology of this is unclear.  It could be either    due to gastrointestinal bleeding or to heavy menses or  a combination of    both.  I think at 38, her gastrointestinal tract should be evaluated at    some point in time. This potentially could be done later as an outpatient. 2. Chest pain.  Myocardial infarction and coronary disease essentially ruled    out. She is currently being evaluated for pulmonary embolus.  PLAN:  Will follow her along with you and should her VQ scan be negative, we could probably go ahead with an endoscopy tomorrow and a colonoscopy at a later date.  We could evaluate her more completely as an outpatient.  I have discussed this with Lenise Herald, M.D., and the patient. DD:  09/10/99 TD:  09/10/99 Job: [id number]

## 2010-08-07 NOTE — Op Note (Signed)
[name], [name]                   ACCOUNT NO.:  [id number]   MEDICAL RECORD NO.:  [id number]          PATIENT TYPE:  INP   LOCATION:  9310                          FACILITY:  [facility]   PHYSICIAN:  Charles A. Delcambre, MDDATE OF BIRTH:  1960/08/14   DATE OF PROCEDURE:  01/19/2005  DATE OF DISCHARGE:                                 OPERATIVE REPORT   PREOPERATIVE DIAGNOSIS:  Menorrhagia.   POSTOPERATIVE DIAGNOSES:  1.  Menorrhagia.  2.  Right ovarian atypical-appearing cyst, possible hemorrhagic-type cyst.   PROCEDURE:  Transabdominal hysterectomy and right salpingo-oophorectomy.   SURGEON:  Charles A. Sydnee Cabal, M.D.   ASSISTANT:  Gerald Leitz, MD.   ANESTHESIA:  General via the endotracheal route.   SPECIMENS:  Uterus and right tube and ovary to pathology.   ESTIMATED BLOOD LOSS:  250 mL.   COMPLICATIONS:  Dense bladder adhesions to the lower uterine segment  consistent with previous cesarean section x3.   Instrument, sponge and needle count were correct x2.   The patient was taken to the operating room and underwent a general  anesthetic without difficulty.  She was sterilely prepped and draped and a  vertical skin incision was made with a knife.  A previous scar was excised  and incision was carried down to the fascia with the peritoneum entered with  Metzenbaum scissors without damage to the bowel, bladder or vascular  structures.  A Balfour retractor was placed and a moistened sponge was used  to pack the bowel out of the pelvis.  The cornual regions of the uterus were  grasped with Kelly clamps.  The round ligaments were opened on either side.  Opening the broad ligaments, ureters were seen.  On the right, the  infundibulopelvic pedicle was isolated and free tied with and transfixion  stitched.  Hemostasis was excellent.  The left utero-ovarian pedicle was  isolated and free-tied and transfixion stitched.  Hemostasis was excellent.  The bladder was taken down with great  care given to avoid injury to the  bladder.  This took approximately 45 minutes to an hour to take the bladder  down carefully anteriorly.  This was done successfully without evidence of  entry into the bladder.  Sterile milk was placed after the hysterectomy was  completed later in the case and indicated no injury to the bladder.  The  uterine vessels were then skeletonized on either side after the bladder was  taken down and simple tying and hemostasis was excellent.  The remaining  pedicles were then taken down on either side, including cardinal ligaments  and uterosacral ligaments.  Vaginal entry was made on either side of the  cornual regions at the corner of the vaginal angles.  Jorgenson scissors  were used to excise the cervix from the vaginal cuff.  The corpus had  previously been excised for better visualization and the cervical specimen  and the lower uterine segment partial specimen was excised to go to  pathology.  The cervix was completely excised.  Richardson angle sutures of  0 Vicryl were placed.  Running locking 0 Vicryl  was then used to close the  cuff.  Hemostasis from all pedicles, the vaginal cuff and the bladder cuff  was excellent.  All instruments were removed and packs and Balfour retractor  were removed.  Counts were correct x2 of the instrument, sponge and needle  count.  Umbilical closure was done with #1 PDS, 2-0 Vicryl subcuticular  stitch was used to close the subcuticular stitch.  Minor electrocautery was  carried out.  The skin was then closed with sterile skin clips and a sterile  dressing was applied.  The patient was taken to recovery having tolerated  the procedure well, with the physician in attendance.      Charles A. Sydnee Cabal, MD  Electronically Signed     CAD/MEDQ  D:  01/19/2005  T:  01/19/2005  Job:  [id number]

## 2010-08-07 NOTE — H&P (Signed)
. [facility]  Patient:    [name], [name]                            MRN: [id number] Adm. Date:  09/09/99 Attending:  Lenise Herald, M.D. Dictator:   Darcella Gasman. Annie Paras, F.N.P.C. CC:         Lenise Herald, M.D.             Delories Heinz, M.D.                         History and Physical  CHIEF COMPLAINT:  Chest pain.  A 50 year old black married female without prior cardiac history developed mid sternal chest pain yesterday after becoming emotionally upset.  Went to bed after taking a half tablet of Zoloft and was awakened at 3 a.m. with mid sternal chest pain with radiation to the left arm.  Felt like something was pressing into her chest.  She took a shower and felt as if she would pass out, laid down on the floor, and finally was able to get to Dr. Fayrene Fearing office in [place].  Given nitroglycerin with relief except for her left arm pain.  Here in the emergency room her left arm pain continues until nitroglycerin drip was started and pain resolved.  PAST MEDICAL HISTORY: 1. Hypertension. 2. Depression. 3. Hypothyroidism. 4. Hypercholesterolemia. 5. Iron deficiency anemia. 6. No sickle cell disease.  Last hemoglobin check was a year ago.  PAST SURGICAL HISTORY:  BTL and cesarean sections.  ALLERGIES:  No known drug allergies.  OUTPATIENT MEDICATIONS:  Maxzide, Levothyroxine, Zoloft.  FAMILY HISTORY:  Mother died of cancer.  Father is alive.  He has had two to three MIs.  Also, he is on hemodialysis with end-stage renal disease.  Two brothers alive and well.  One sister with hypertension and hypertension does run in the family.  SOCIAL HISTORY:  Married.  Three kids.  Does use tobacco for about a year just occasional cigarette.  REVIEW OF SYSTEMS:  No reflux.  No diarrhea, constipation.  Denies melena. GU:  No dysuria.  ENDOCRINE:  No diabetes.  Positive thyroid disease. CARDIAC:  Negative.  CARDIOVASCULAR:  Positive hypertension.  LUNGS:   Negative for asthma.  MUSCULOSKELETAL:  Negative.  MENTAL STATUS:  Increased stress and depression.  HEMATOLOGY:  Positive anemia.  PHYSICAL EXAMINATION:  VITAL SIGNS:  Blood pressure 153/94.  Pulse 79.  Respirations 16.  Temperature 97.3.  GENERAL:  Alert and oriented black female with anxiety and pain.  SKIN:  Warm and dry.  EXTREMITIES:  Brisk capillary refill.  No clubbing or cyanosis.  No edema. Pedal and radial pulses bilaterally 2+.  HEENT:  Unremarkable.  NECK:  Supple.  No JVD.  No bruits.  HEART:  S1, S2.  Regular rate and rhythm.  No murmur, gallop, rub, or click.  LUNGS:  Clear.  ABDOMEN:  Soft, nontender.  Positive bowel sounds.  No masses.  NEUROLOGIC:  Nonfocal.  RECTAL:  No masses felt.  Hemoccult was negative.  LABORATORIES:  EKG:  Sinus rhythm without any changes.  Portable chest x-ray no active disease.  Hemoglobin 7.8, hematocrit 27, platelets 401, WBC 3.6.  IMPRESSION: 1. Chest pain relieved with nitroglycerin. 2. Hypertension. 3. Anemia. 4. Hypothyroidism.  PLAN:  IV heparin, IV nitroglycerin.  Check cholesterol in the morning, ______ today.  Control blood pressure and transfuse and add iron as well as check an iron  level.DD:  09/09/99 TD:  09/09/99 Job: [id number]

## 2011-01-18 ENCOUNTER — Other Ambulatory Visit: Payer: Self-pay | Admitting: Family Medicine

## 2011-01-29 ENCOUNTER — Ambulatory Visit
Admission: RE | Admit: 2011-01-29 | Discharge: 2011-01-29 | Disposition: A | Discharge status: Home / Self Care | Payer: Medicare Other | Source: Ambulatory Visit | Attending: Family Medicine | Admitting: Family Medicine

## 2011-01-29 ENCOUNTER — Other Ambulatory Visit: Payer: Self-pay | Admitting: Diagnostic Radiology

## 2011-01-29 ENCOUNTER — Other Ambulatory Visit: Payer: Self-pay | Admitting: Family Medicine

## 2011-02-01 ENCOUNTER — Other Ambulatory Visit: Payer: Self-pay | Admitting: Family Medicine

## 2011-02-01 ENCOUNTER — Ambulatory Visit
Admission: RE | Admit: 2011-02-01 | Discharge: 2011-02-01 | Disposition: A | Discharge status: Home / Self Care | Payer: Medicare Other | Source: Ambulatory Visit | Attending: Family Medicine | Admitting: Family Medicine

## 2011-02-01 DIAGNOSIS — C50912 Malignant neoplasm of unspecified site of left female breast: Secondary | ICD-10-CM

## 2011-02-02 ENCOUNTER — Telehealth: Payer: Self-pay | Admitting: *Deleted

## 2011-02-02 ENCOUNTER — Other Ambulatory Visit: Payer: Self-pay | Admitting: *Deleted

## 2011-02-02 DIAGNOSIS — C50919 Malignant neoplasm of unspecified site of unspecified female breast: Secondary | ICD-10-CM

## 2011-02-02 NOTE — Telephone Encounter (Signed)
Confirmed [facility] for 02/10/11 at 1215 .  Instructions and contact information given.  

## 2011-02-05 ENCOUNTER — Ambulatory Visit
Admission: RE | Admit: 2011-02-05 | Discharge: 2011-02-05 | Disposition: A | Discharge status: Home / Self Care | Payer: Medicare Other | Source: Ambulatory Visit | Attending: Family Medicine | Admitting: Family Medicine

## 2011-02-05 DIAGNOSIS — C50912 Malignant neoplasm of unspecified site of left female breast: Secondary | ICD-10-CM

## 2011-02-05 MED ORDER — GADOBENATE DIMEGLUMINE 529 MG/ML IV SOLN
18.0000 mL | Freq: Once | INTRAVENOUS | Status: AC | PRN
  Administered 2011-02-05: 18 mL via INTRAVENOUS

## 2011-02-08 ENCOUNTER — Ambulatory Visit
Admission: RE | Admit: 2011-02-08 | Discharge: 2011-02-08 | Disposition: A | Discharge status: Home / Self Care | Payer: Medicare Other | Source: Ambulatory Visit | Attending: Family Medicine | Admitting: Family Medicine

## 2011-02-08 ENCOUNTER — Other Ambulatory Visit: Payer: Self-pay | Admitting: Family Medicine

## 2011-02-08 DIAGNOSIS — R928 Other abnormal and inconclusive findings on diagnostic imaging of breast: Secondary | ICD-10-CM

## 2011-02-10 ENCOUNTER — Ambulatory Visit (HOSPITAL_BASED_OUTPATIENT_CLINIC_OR_DEPARTMENT_OTHER): Payer: Medicare Other | Admitting: Surgery

## 2011-02-10 ENCOUNTER — Ambulatory Visit: Payer: Medicare Other | Attending: Surgery | Admitting: Physical Therapy

## 2011-02-10 ENCOUNTER — Telehealth: Payer: Self-pay | Admitting: Oncology

## 2011-02-10 ENCOUNTER — Encounter: Payer: Self-pay | Admitting: Oncology

## 2011-02-10 ENCOUNTER — Ambulatory Visit
Admission: RE | Admit: 2011-02-10 | Discharge: 2011-02-10 | Disposition: A | Discharge status: Home / Self Care | Payer: Medicare Other | Source: Ambulatory Visit | Attending: Radiation Oncology | Admitting: Radiation Oncology

## 2011-02-10 ENCOUNTER — Ambulatory Visit: Payer: Medicare Other

## 2011-02-10 ENCOUNTER — Other Ambulatory Visit (HOSPITAL_BASED_OUTPATIENT_CLINIC_OR_DEPARTMENT_OTHER): Payer: Medicare Other | Admitting: Lab

## 2011-02-10 ENCOUNTER — Ambulatory Visit (HOSPITAL_BASED_OUTPATIENT_CLINIC_OR_DEPARTMENT_OTHER): Payer: Medicare Other | Admitting: Oncology

## 2011-02-10 ENCOUNTER — Encounter (INDEPENDENT_AMBULATORY_CARE_PROVIDER_SITE_OTHER): Payer: Self-pay | Admitting: Surgery

## 2011-02-10 ENCOUNTER — Ambulatory Visit: Payer: Medicare Other | Admitting: Physical Therapy

## 2011-02-10 VITALS — BP 115/73 | HR 77 | Temp 97.7°F | Ht <= 58 in | Wt 200.0 lb

## 2011-02-10 VITALS — BP 115/73 | HR 77

## 2011-02-10 DIAGNOSIS — C50919 Malignant neoplasm of unspecified site of unspecified female breast: Secondary | ICD-10-CM

## 2011-02-10 DIAGNOSIS — IMO0001 Reserved for inherently not codable concepts without codable children: Secondary | ICD-10-CM | POA: Insufficient documentation

## 2011-02-10 DIAGNOSIS — C50812 Malignant neoplasm of overlapping sites of left female breast: Secondary | ICD-10-CM | POA: Insufficient documentation

## 2011-02-10 DIAGNOSIS — Z170 Estrogen receptor positive status [ER+]: Secondary | ICD-10-CM

## 2011-02-10 DIAGNOSIS — F17200 Nicotine dependence, unspecified, uncomplicated: Secondary | ICD-10-CM

## 2011-02-10 DIAGNOSIS — I509 Heart failure, unspecified: Secondary | ICD-10-CM

## 2011-02-10 DIAGNOSIS — M25519 Pain in unspecified shoulder: Secondary | ICD-10-CM | POA: Insufficient documentation

## 2011-02-10 DIAGNOSIS — R293 Abnormal posture: Secondary | ICD-10-CM | POA: Insufficient documentation

## 2011-02-10 LAB — COMPREHENSIVE METABOLIC PANEL
BUN: 9 mg/dL (ref 6–23)
CO2: 29 mEq/L (ref 19–32)
Calcium: 9.7 mg/dL (ref 8.4–10.5)
Chloride: 101 mEq/L (ref 96–112)
Creatinine, Ser: 1.02 mg/dL (ref 0.50–1.10)

## 2011-02-10 LAB — CBC WITH DIFFERENTIAL/PLATELET
Basophils Absolute: 0 10*3/uL (ref 0.0–0.1)
HCT: 36.3 % (ref 34.8–46.6)
HGB: 12.4 g/dL (ref 11.6–15.9)
MCH: 29.1 pg (ref 25.1–34.0)
MONO#: 0.3 10*3/uL (ref 0.1–0.9)
NEUT%: 52.9 % (ref 38.4–76.8)
Platelets: 246 10*3/uL (ref 145–400)
WBC: 4.9 10*3/uL (ref 3.9–10.3)
lymph#: 2 10*3/uL (ref 0.9–3.3)

## 2011-02-10 LAB — CANCER ANTIGEN 27.29: CA 27.29: 24 U/mL (ref 0–39)

## 2011-02-10 NOTE — Progress Notes (Signed)
[name]    HPI: 50 year old [place] woman followed by Lonie Peak, who noted a change in her left breast approximately one month ago. Dr. Anna Genre set her up for mammography at the [facility] 01/29/2011, and this showed an irregular high-density mass with ill-defined margins in the upper outer quadrant of the left breast. This was palpable and mobile. Ultrasound showed an irregular hypoechoic mass with lobulated margins measuring up to 2.9 cm. One left axillary lymph node was noted to be enlarged at 2.6 cm. There were no other abnormal lymph nodes noted. 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  With this information the patient was set up for bilateral breast MRIs 02/05/2011 showing in the left breast mass in question to measure up to 4.9 cm. There were multiple small nodules without fatty hila in the left axilla, suspicious for metastatic disease there. The right side was unremarkable and there was no suspicious internal mammary adenopathy.  On 02/08/2011 the patient underwent biopsy of the largest left axillary lymph node, and this was positive ([id number]). With this information the patient now presents to the multidisciplinary breast cancer clinic for a definitive treatment plan.  Past Medical History  Diagnosis Date  . Depression   . Hypothyroidism   . Sleep apnea   . Congestive heart failure   . Breast cancer   . Emphysema of lung   . GERD (gastroesophageal reflux disease)   . Heart murmur   . Hypertension     Past Surgical History  Procedure Date  . Abdominal hysterectomy   . Tah-   . Slapingooophorectomy, right remote    Family History  Problem Relation Age of Onset  . Leukemia Mother    the patient's father died at the age of 44  from renal failure the patient's mother died at the age of 81 from acute leukemia. The patient has 2 brothers and 2 sisters, there is no history of breast or ovarian cancer in the family to her knowledge.  Gynecologic history: Menarche age 80, menopause 2005, the patient never took hormone replacement. She is GX P3, first pregnancy to term age 31.  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 Her children are Sharlize Hoar, lives in [place] and works for the post office, she is 49 years old; [name], 26 a lives in [place]. [place] and works for [employer], and [name],  21, who lives in [place] and works in daycare. The patient has 5 grandchildrenShe attends the [employer] in [place].   She  reports that she has been smoking.  She does not have any smokeless tobacco history on file. She reports that she drinks alcohol. Her drug history not on file.  Health maintenance:  Cholesterol : "fine"  Bone density : never   Colonoscopy: never  (PAP): not since hysterectomy   Allergies:  Allergies  Allergen Reactions  . Aspirin Hives  . Morphine And Related Hives and Swelling  . Penicillins Hives        ROS she gives me no symptoms clearly suggestive of metastatic disease.she complains of night sweats/hot flashes, feeling tired all the time, pain in her left breast which she describes  as throbbing and intermittent, blurred vision and she feels she really needs new glasses; her dentures don't fit very well; just some ringing in her ears; as she tells me her feet swell at times and she has "poor circulation". She can be short of breath particularly when walking up stairs and sleeps on 4 pillows at night. She has a poor appetite, significant heartburn issues, her stool has been lighter in color recently, and she has a history of urinary tract infections. She feels she bruises easily, has  frequent headaches, which have not changed in frequency or intensity, and has back pain, feels anxious and depressed, and of course she has a history of hypothyroidism. She does have a treadmill at home and uses it up to 30 minutes at a time perhaps a couple of times a week.   Physical Exam:  Blood pressure 115/73, pulse 77, temperature 97.7 F (36.5 C), temperature source Oral, height 4' 9.8" (1.468 m), weight 200 lb (90.719 kg).  Sclerae unicteric Oropharynx clear No peripheral adenopathy Lungs no rales or rhonchi Heart regular rate and rhythm Abd benign MSK no focal spinal tenderness, no peripheral edema Neuro: nonfocal Breasts:Right breast no suspicious masses left breast there is a firm mass in the upper outer quadrant of the left breast which is mobile, difficult to delineate exactly. It seems to be more than 4 cm by palpation. There is no skin change and no nipple retraction associated with this.   LABS   Results for orders placed in visit on 02/10/11 (from the past 48 hour(s))  CBC WITH DIFFERENTIAL     Status: Normal   Collection Time   02/10/11 12:04 PM      Component Value Range Comment   WBC 4.9  3.9 - 10.3 (10e3/uL)    NEUT# 2.6  1.5 - 6.5 (10e3/uL)    HGB 12.4  11.6 - 15.9 (g/dL)    HCT 19.1  47.8 - 29.5 (%)    Platelets 246  145 - 400 (10e3/uL)    MCV 84.9  79.5 - 101.0 (fL)    MCH 29.1  25.1 - 34.0 (pg)    MCHC 34.3  31.5 - 36.0 (g/dL)    RBC 6.21  3.08 - 6.57 (10e6/uL)    RDW 14.2  11.2 - 14.5 (%)    lymph# 2.0  0.9 - 3.3 (10e3/uL)    MONO# 0.3  0.1 - 0.9 (10e3/uL)    Eosinophils Absolute 0.0  0.0 - 0.5 (10e3/uL)    Basophils Absolute 0.0  0.0 - 0.1 (10e3/uL)    NEUT% 52.9  38.4 - 76.8 (%)    LYMPH% 40.8  14.0 - 49.7 (%)    MONO% 5.9  0.0 - 14.0 (%)    EOS% 0.0  0.0 - 7.0 (%)    BASO% 0.4  0.0 - 2.0 (%)   COMPREHENSIVE METABOLIC PANEL     Status: Abnormal   Collection Time   02/10/11 12:04 PM      Component Value Range Comment   Sodium 139  135 - 145  (mEq/L)    Potassium 3.2 (*) 3.5 - 5.3 (mEq/L)    Chloride 101  96 - 112 (mEq/L)    CO2 29  19 - 32 (mEq/L)    Glucose, Bld 92  70 - 99 (mg/dL)    BUN 9  6 - 23 (mg/dL)    Creatinine, Ser 8.46  0.50 - 1.10 (mg/dL)    Total Bilirubin 0.2 (*) 0.3 - 1.2 (mg/dL)  Alkaline Phosphatase 73  39 - 117 (U/L)    AST 25  0 - 37 (U/L)    ALT 22  0 - 35 (U/L)    Total Protein 7.7  6.0 - 8.3 (g/dL)    Albumin 4.0  3.5 - 5.2 (g/dL)    Calcium 9.7  8.4 - 10.5 (mg/dL)        [place] Core Biopsy  02/09/2011  **ADDENDUM** CREATED: 02/09/2011 11:55:43  Histologic evaluation demonstrates metastatic ductal carcinoma involving the biopsy lymph node.  Results were discussed with the patient by telephone at her request.  She reports no complications from the procedure.  She is scheduled to be seen in the [facility] on 02/10/2011.  Addended by:  Cain Saupe, M.D. on 02/09/2011 11:55:43.  **END ADDENDUM** SIGNED BY: Cain Saupe, M.D.   02/09/2011  *RADIOLOGY REPORT*  Clinical Data:  Abnormal MRI, abnormal left axillary lymph nodes.  ULTRASOUND GUIDED CORE BIOPSY OF THE LEFT AXILLA  I met with the patient, and we discussed the procedure of ultrasound-guided biopsy, including risks, benefits, and alternatives.  Specifically, we discussed the risks of infection, bleeding, tissue injury, and inadequate sampling.  Informed, written consent was given.  Using sterile technique,  2% lidocaine, ultrasound guidance, and a 14 gauge automated biopsy device, biopsy was performed of a left axillary lymph node with a focal contour abnormality.  IMPRESSION: Ultrasound-guided biopsy of a left axillary lymph node.  No apparent complications.  Original Report Authenticated By: Daryl Eastern, M.D.       Assessment:  50 year old [place] woman with what clinically is a T2 N1-2 or likely stage III invasive ductal carcinoma, strongly estrogen receptor positive, but weakly progesterone receptor  positive, with an elevated MIB-1-1 and HER-2 amplification with aCISH ratio of 2.87; in the setting of a history of congestive heart failure, and ongoing tobacco abuse.  Plan: I spent the better part of our one-hour discussion explaining the nature of her tumor and the concern we have regarding at distant spread. The patient understands that normally she would be a good candidate for trastuzumab, but because of the history of congestive heart failure we may need to forego that medication she would then be treated with chemotherapy initially to shrink the tumor and make surgery easier, then have surgery, then radiation, then followup with anti-estrogens. She understands we really do need to make sure she does not have stage IV disease and accordingly she will have a CT of the chest and brain and a PET scan. She will return to see me early December. By then she should have a port in place and we will be able to decide on the specific chemotherapy agents to use, but likely we will use cyclophosphamide and docetaxel.   I should note her sister Clydie Braun, who teaches in women studies at [employer] was present today, and I have no doubt will help [place] motivate and support [name] as she faces her treatments. Specifically I have urged Ms. Clontz to quit smoking, and I have written her for a nicotine patch (she has tried Chantix in the past and "it didn't work".   MAGRINAT,GUSTAV C 02/10/2011, 2:06 PM

## 2011-02-10 NOTE — Progress Notes (Signed)
Patient ID: [name], female   DOB: 10/17/1960, 50 y.o.   MRN: [id number]  Chief Complaint  Patient presents with  . Other    left breast cancer    HPI [name] is a 50 y.o. female.   HPI She is referred by Dr. Conroy for evaluation of a left breast mass. She recently palpated a mass in her left breast and presented for mammograms. She has has had biopsy of the mass as well as a lymph node in her left axilla which both showed invasive cancer. She has had no previous history of breast biopsies or breast malignancy. She denies discharge from her nipples. She reports pain in the breast. Past Medical History  Diagnosis Date  . Depression   . Hypothyroidism   . Sleep apnea   . Congestive heart failure   . Breast cancer   . Emphysema of lung   . GERD (gastroesophageal reflux disease)   . Heart murmur   . Hypertension     Past Surgical History  Procedure Date  . Abdominal hysterectomy   . Tah-   . Slapingooophorectomy, right remote    Family History  Problem Relation Age of Onset  . Leukemia Mother     Social History History  Substance Use Topics  . Smoking status: Current Everyday Smoker -- 1.0 packs/day  . Smokeless tobacco: Not on file  . Alcohol Use: 0.0 oz/week    5-7 Glasses of wine per week    Allergies  Allergen Reactions  . Aspirin Hives  . Morphine And Related Hives and Swelling  . Penicillins Hives    Current Outpatient Prescriptions  Medication Sig Dispense Refill  . carvedilol (COREG) 25 MG tablet Take 25 mg by mouth 2 (two) times daily with a meal.        . furosemide (LASIX) 40 MG tablet Take 40 mg by mouth daily. 3 in Am  2 in pm       . hydrochlorothiazide (HYDRODIURIL) 25 MG tablet Take 25 mg by mouth daily.        . levothyroxine (SYNTHROID, LEVOTHROID) 100 MCG tablet Take 100 mcg by mouth daily.        . lisinopril (PRINIVIL,ZESTRIL) 20 MG tablet Take 20 mg by mouth daily.        . magnesium oxide (MAG-OX) 400 MG tablet Take 800 mg by  mouth daily.        . naproxen sodium (ANAPROX) 220 MG tablet Take 220 mg by mouth 2 (two) times daily with a meal.        . nitroGLYCERIN (NITROSTAT) 0.4 MG SL tablet Place 0.4 mg under the tongue every 5 (five) minutes as needed.        . omeprazole (PRILOSEC) 20 MG capsule Take 20 mg by mouth daily.        . oxyCODONE-acetaminophen (PERCOCET) 5-325 MG per tablet Take 1 tablet by mouth 2 (two) times daily as needed.        . potassium chloride SA (K-DUR,KLOR-CON) 20 MEQ tablet Take 20 mEq by mouth 2 (two) times daily.        . sertraline (ZOLOFT) 100 MG tablet Take 100 mg by mouth daily.        . zolpidem (AMBIEN) 10 MG tablet Take 10 mg by mouth at bedtime as needed.          Review of Systems Review of Systems  Constitutional: Positive for fatigue. Negative for fever, chills and unexpected weight change.    HENT: Positive for tinnitus. Negative for hearing loss, congestion, sore throat, trouble swallowing and voice change.   Eyes: Negative for visual disturbance.  Respiratory: Negative for cough and wheezing.   Cardiovascular: Positive for chest pain and leg swelling. Negative for palpitations.  Gastrointestinal: Negative for nausea, vomiting, abdominal pain, diarrhea, constipation, blood in stool, abdominal distention and anal bleeding.  Genitourinary: Negative for hematuria, vaginal bleeding and difficulty urinating.  Musculoskeletal: Positive for back pain. Negative for arthralgias.  Skin: Negative for rash and wound.  Neurological: Positive for numbness and headaches. Negative for seizures and syncope.  Hematological: Negative for adenopathy. Does not bruise/bleed easily.  Psychiatric/Behavioral: Positive for agitation. Negative for confusion.    Blood pressure 115/73, pulse 77.  Physical Exam Physical Exam  Constitutional: She is oriented to person, place, and time. She appears well-developed and well-nourished. No distress.  HENT:  Head: Normocephalic and atraumatic.  Right  Ear: External ear normal.  Left Ear: External ear normal.  Nose: Nose normal.  Mouth/Throat: Oropharynx is clear and moist.  Eyes: Conjunctivae are normal. Pupils are equal, round, and reactive to light. No scleral icterus.  Neck: Normal range of motion. Neck supple. No tracheal deviation present. No thyromegaly present.  Cardiovascular: Normal rate, regular rhythm, normal heart sounds and intact distal pulses.   No murmur heard. Pulmonary/Chest: Effort normal and breath sounds normal. No respiratory distress. She has no wheezes. She has no rales.  Abdominal: Bowel sounds are normal. She exhibits no distension. There is no tenderness.  Musculoskeletal: Normal range of motion. She exhibits no edema.  Lymphadenopathy:    She has no cervical adenopathy.  Neurological: She is alert and oriented to person, place, and time.  Skin: Skin is warm and dry. No rash noted. No erythema.  Psychiatric: Her behavior is normal. Judgment normal.  Patient has a firm, hard, mobile mass in the lower outer quadrant of the left breast. This is mildly tender. There are no other breast masses in either breast. I cannot appreciate any axillary, supraclavicular, or cervical adenopathy.  Data Reviewed The patient's MRI demonstrates that the mass is approximately 4.8 x 4.9 x 4.8 cm. 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 She also has a biopsy of the lymph node which was positive for metastatic ductal carcinoma. She is ER positive and HER-2 positive  Assessment    Patient with a noted positive large left breast cancer    Plan    She was discussed in the multidisciplinary breast conference. Recommendations are for neoadjuvant therapy. We discussed this with her in detail and she is in agreement. She will need a Port-A-Cath placed for initiation of her chemotherapy.  I discussed the risk of the procedure with her which includes, but is not limited to, bleeding, infection, pneumothorax, etc. She  understands and wishes to proceed with port placement. Likely of success with this procedure is good. Hopefully she can be down staged and then undergo a left breast lumpectomy and axillary. dissection       [name] A 02/10/2011, 2:38 PM    

## 2011-02-10 NOTE — Telephone Encounter (Signed)
gv pt appt for dec2012.  scheduled pt for echo on 12/04 @ 1pm @ [facility].  scheduled pet scan and ct scan on 12/05 @ 10am @ [facility]

## 2011-02-15 ENCOUNTER — Other Ambulatory Visit: Payer: Medicare Other

## 2011-02-15 NOTE — Progress Notes (Signed)
CC:   Abigail Miyamoto, M.D. Lowella Dell, M.D.  REFERRING PHYSICIAN:  Abigail Miyamoto, M.D.  DIAGNOSIS:  Invasive ductal carcinoma of the left breast.  HISTORY OF PRESENT ILLNESS:  The patient is a 50 year old female who noticed a change in her left breast approximately 4 weeks ago.  [facility] and this did reveal an irregular mass with ill-defined margins in the upper-outer quadrant of the left breast.  This area did correspond to a palpable abnormality and ultrasound showed an irregular hypoechoic mass with lobulated margins measuring up to 2.9 cm.  There was 1 suspicious left axillary lymph node which was noted to be enlarged at 2.6 cm.  A biopsy was performed and this did reveal an invasive ductal carcinoma which was a grade 2.  Receptor studies have indicated that the tumor was ER positive, PR weakly positive, and HER2-positive.  The proliferative index was 61%.  The patient has proceeded with a left lymph node biopsy well and this returned positive for metastatic ductal carcinoma. Bilateral breast MRI scans have been performed and this showed a left breast mass measuring up to 4.9 cm.  There were multiple small nodules without fatty hila in the left axilla suspicious for lymph node involvement in the left axilla.  The right side was unremarkable.  No suspicious internal mammary lymph nodes were identified.  The patient therefore presents to the Multidisciplinary Breast Clinic for evaluation and for development of a treatment plan.  PAST MEDICAL HISTORY:  Hypothyroidism, history of CHF, sleep apnea, depression, emphysema, GERD, history of heart murmur, history of hypertension, status post abdominal hysterectomy, status post TAH/oophorectomy on the right. No history of radiotherapy.  CURRENT MEDICATIONS:  Coreg, Lasix, hydrochlorothiazide, Synthroid, Zestril, nitroglycerin, Prilosec, Percocet, Zoloft, and  Ambien.  ALLERGIES:  Aspirin, penicillins, and morphine.  FAMILY HISTORY:  The patient's mother had leukemia.  No history of breast or ovarian cancer in her family.  SOCIAL HISTORY:  The patient is currently unemployed.  She is divorced and lives by herself.  She does have 3 children.  The patient does report a history of smoking, and she does drink alcohol on an occasional basis.  REVIEW OF SYSTEMS:  Fully reviewed and negative other than as above.  PHYSICAL EXAM:  Vital signs: Weight 200 pounds.  Blood pressure 115/73, pulse 77, respiratory rate 20, and temperature 97.7.  General:  A Well- developed female in no acute distress.  Alert and oriented x3.  HEENT: Extraocular movements intact.  Oral cavity clear.  Neck:  Supple without any lymphadenopathy.  Cardiovascular:  Regular rate and rhythm. Respiratory: Clear to auscultation bilaterally.  Breast exam:  No suspicious masses or axillary adenopathy on the right.  There was a firm mass within the upper-outer quadrant of the left breast.  This measures approximately 4-4.5 cm.  No skin change and no nipple retraction. GI: Abdomen is soft, nontender.  Normal bowel sounds.  Extremities:  No edema present.  Neuro: Re no focal deficits.  IMPRESSION AND PLAN:  Ms. Fariss is a pleasant 50 year old female with a recent diagnosis of invasive ductal carcinoma which clinically represents a T2 tumor with N1-2 disease.  The receptor studies have indicated that the tumor is ER and PR positive, as well as HER-2/neu positive as well.  We discussed her case at Breast Conference today, in a multidisciplinary fashion, and we did discuss a recommendation to proceed with a course of neoadjuvant chemotherapy followed by likely a lumpectomy and axillary  lymph node dissection.  This then would be followed with adjuvant radiation treatment.  We also will proceed with staging studies also.  I discussed therefore an anticipated 6-1/2-week course of treatment  in this setting, and she knows that this will follow both chemotherapy and surgery.  Therefore, radiation is some ways off but she did appreciate I believe going through what this would entail when the time comes.  Therefore, I do anticipate a 6-1/2-week course of radiation in her case, and I look forward to seeing her postoperatively.    ______________________________ Radene Gunning, M.D., Ph.D. JSM/MEDQ  D:  02/15/2011  T:  02/15/2011  Job:  [id number]

## 2011-02-16 ENCOUNTER — Encounter: Payer: Self-pay | Admitting: *Deleted

## 2011-02-16 ENCOUNTER — Telehealth: Payer: Self-pay | Admitting: *Deleted

## 2011-02-16 NOTE — Telephone Encounter (Signed)
Spoke with pt concerning [facility] from 02/10/11.  Pt denies needs or concerns at this time.  Encourage pt to call with questions.  Received verbal understanding.  Contact information given.

## 2011-02-16 NOTE — Telephone Encounter (Signed)
Left VM for pt to return call regarding [facility] from 02/10/11.

## 2011-02-16 NOTE — Telephone Encounter (Signed)
patient confirmed over the phone about the chemo class 02-23-2011 at 9:30am

## 2011-02-17 ENCOUNTER — Encounter (HOSPITAL_COMMUNITY): Payer: Self-pay | Admitting: Pharmacy Technician

## 2011-02-23 ENCOUNTER — Other Ambulatory Visit (HOSPITAL_COMMUNITY): Payer: Medicare Other

## 2011-02-23 ENCOUNTER — Other Ambulatory Visit: Payer: Medicare Other

## 2011-02-23 ENCOUNTER — Encounter (HOSPITAL_COMMUNITY)
Admission: RE | Admit: 2011-02-23 | Discharge: 2011-02-23 | Disposition: A | Discharge status: Home / Self Care | Payer: Medicare Other | Source: Ambulatory Visit | Attending: Surgery | Admitting: Surgery

## 2011-02-23 ENCOUNTER — Ambulatory Visit (HOSPITAL_COMMUNITY)
Admission: RE | Admit: 2011-02-23 | Discharge: 2011-02-23 | Disposition: A | Discharge status: Home / Self Care | Payer: Medicare Other | Source: Ambulatory Visit | Attending: Surgery | Admitting: Surgery

## 2011-02-23 ENCOUNTER — Ambulatory Visit (HOSPITAL_COMMUNITY)
Admission: RE | Admit: 2011-02-23 | Discharge: 2011-02-23 | Disposition: A | Discharge status: Home / Self Care | Payer: Medicare Other | Source: Ambulatory Visit | Attending: Oncology | Admitting: Oncology

## 2011-02-23 ENCOUNTER — Encounter (HOSPITAL_COMMUNITY): Payer: Self-pay

## 2011-02-23 DIAGNOSIS — Z01812 Encounter for preprocedural laboratory examination: Secondary | ICD-10-CM | POA: Insufficient documentation

## 2011-02-23 DIAGNOSIS — I509 Heart failure, unspecified: Secondary | ICD-10-CM | POA: Insufficient documentation

## 2011-02-23 DIAGNOSIS — M6289 Other specified disorders of muscle: Secondary | ICD-10-CM | POA: Insufficient documentation

## 2011-02-23 DIAGNOSIS — I10 Essential (primary) hypertension: Secondary | ICD-10-CM | POA: Insufficient documentation

## 2011-02-23 DIAGNOSIS — I059 Rheumatic mitral valve disease, unspecified: Secondary | ICD-10-CM | POA: Insufficient documentation

## 2011-02-23 DIAGNOSIS — C50919 Malignant neoplasm of unspecified site of unspecified female breast: Secondary | ICD-10-CM | POA: Insufficient documentation

## 2011-02-23 MED ORDER — CIPROFLOXACIN IN D5W 400 MG/200ML IV SOLN: 400.0000 mg | INTRAVENOUS | Status: DC

## 2011-02-23 NOTE — Progress Notes (Addendum)
REQUESTED OV NOTE EKG, STUDIES DR H SMITH. CXR 02/23/11

## 2011-02-23 NOTE — Progress Notes (Signed)
*  PRELIMINARY RESULTS* Echocardiogram 2D Echocardiogram has been performed.  Cathie Beams Deneen 02/23/2011, 3:16 PM

## 2011-02-23 NOTE — Pre-Procedure Instructions (Signed)
20 [name]  02/23/2011   Your procedure is scheduled on:  Dec 7  Report to [facility] at 0530 AM.  Call this number if you have problems the morning of surgery: [phone]   Remember:   Do not eat food:After Midnight.  May have clear liquids: up to 4 Hours before arrival.  Clear liquids include soda, tea, black coffee, apple or grape juice, broth.  Take these medicines the morning of surgery with A SIP OF WATER prilosec,zoloft  Do not wear jewelry, make-up or nail polish.  Do not wear lotions, powders, or perfumes. You may wear deodorant.  Do not shave 48 hours prior to surgery.  Do not bring valuables to the hospital.  Contacts, dentures or bridgework may not be worn into surgery.  Leave suitcase in the car. After surgery it may be brought to your room.  For patients admitted to the hospital, checkout time is 11:00 AM the day of discharge.   Patients discharged the day of surgery will not be allowed to drive home.  Name and phone number of your driver:karen  Special Instructions: CHG Shower Use Special Wash: 1/2 bottle night before surgery and 1/2 bottle morning of surgery.   Please read over the following fact sheets that you were given: MRSA Information and Surgical Site Infection Prevention

## 2011-02-24 ENCOUNTER — Telehealth: Payer: Self-pay | Admitting: Oncology

## 2011-02-24 ENCOUNTER — Encounter (HOSPITAL_COMMUNITY)
Admission: RE | Admit: 2011-02-24 | Discharge: 2011-02-24 | Disposition: A | Discharge status: Home / Self Care | Payer: Medicare Other | Source: Ambulatory Visit | Attending: Oncology | Admitting: Oncology

## 2011-02-24 ENCOUNTER — Ambulatory Visit (HOSPITAL_COMMUNITY)
Admission: RE | Admit: 2011-02-24 | Discharge: 2011-02-24 | Disposition: A | Discharge status: Home / Self Care | Payer: Medicare Other | Source: Ambulatory Visit | Attending: Oncology | Admitting: Oncology

## 2011-02-24 DIAGNOSIS — C50919 Malignant neoplasm of unspecified site of unspecified female breast: Secondary | ICD-10-CM

## 2011-02-24 DIAGNOSIS — C773 Secondary and unspecified malignant neoplasm of axilla and upper limb lymph nodes: Secondary | ICD-10-CM | POA: Insufficient documentation

## 2011-02-24 LAB — GLUCOSE, CAPILLARY: Glucose-Capillary: 100 mg/dL — ABNORMAL HIGH (ref 70–99)

## 2011-02-24 MED ORDER — IOHEXOL 300 MG/ML  SOLN
100.0000 mL | Freq: Once | INTRAMUSCULAR | Status: AC | PRN
  Administered 2011-02-24: 100 mL via INTRAVENOUS

## 2011-02-24 MED ORDER — FLUDEOXYGLUCOSE F - 18 (FDG) INJECTION: 16.9000 | Freq: Once | INTRAVENOUS | Status: DC | PRN

## 2011-02-24 NOTE — Telephone Encounter (Signed)
Made a note °

## 2011-02-25 ENCOUNTER — Telehealth: Payer: Self-pay | Admitting: *Deleted

## 2011-02-25 NOTE — Telephone Encounter (Signed)
Pt called with questions pertaining to her port that is scheduled to be placed on 02/26/11.  Gave port-a-cath education and discussed EMLA cream application prior to chemotherapy treatments.  Received verbal understanding.  Gave contact information for further needs or questions.

## 2011-02-26 ENCOUNTER — Ambulatory Visit (HOSPITAL_COMMUNITY)
Admission: RE | Admit: 2011-02-26 | Discharge: 2011-02-26 | Disposition: A | Discharge status: Home / Self Care | Payer: Medicare Other | Source: Ambulatory Visit | Attending: Surgery | Admitting: Surgery

## 2011-02-26 ENCOUNTER — Ambulatory Visit (HOSPITAL_COMMUNITY): Payer: Medicare Other | Admitting: Anesthesiology

## 2011-02-26 ENCOUNTER — Other Ambulatory Visit: Payer: Self-pay

## 2011-02-26 ENCOUNTER — Ambulatory Visit (HOSPITAL_COMMUNITY): Payer: Medicare Other

## 2011-02-26 ENCOUNTER — Encounter (HOSPITAL_COMMUNITY)
Admission: RE | Disposition: A | Discharge status: Home / Self Care | Payer: Self-pay | Source: Ambulatory Visit | Attending: Surgery

## 2011-02-26 ENCOUNTER — Encounter (HOSPITAL_COMMUNITY): Payer: Self-pay | Admitting: Anesthesiology

## 2011-02-26 DIAGNOSIS — C50919 Malignant neoplasm of unspecified site of unspecified female breast: Secondary | ICD-10-CM | POA: Insufficient documentation

## 2011-02-26 DIAGNOSIS — E039 Hypothyroidism, unspecified: Secondary | ICD-10-CM | POA: Insufficient documentation

## 2011-02-26 DIAGNOSIS — I10 Essential (primary) hypertension: Secondary | ICD-10-CM | POA: Insufficient documentation

## 2011-02-26 DIAGNOSIS — K219 Gastro-esophageal reflux disease without esophagitis: Secondary | ICD-10-CM | POA: Insufficient documentation

## 2011-02-26 DIAGNOSIS — I509 Heart failure, unspecified: Secondary | ICD-10-CM | POA: Insufficient documentation

## 2011-02-26 DIAGNOSIS — J449 Chronic obstructive pulmonary disease, unspecified: Secondary | ICD-10-CM | POA: Insufficient documentation

## 2011-02-26 DIAGNOSIS — F329 Major depressive disorder, single episode, unspecified: Secondary | ICD-10-CM | POA: Insufficient documentation

## 2011-02-26 DIAGNOSIS — G4730 Sleep apnea, unspecified: Secondary | ICD-10-CM | POA: Insufficient documentation

## 2011-02-26 DIAGNOSIS — F3289 Other specified depressive episodes: Secondary | ICD-10-CM | POA: Insufficient documentation

## 2011-02-26 DIAGNOSIS — J4489 Other specified chronic obstructive pulmonary disease: Secondary | ICD-10-CM | POA: Insufficient documentation

## 2011-02-26 HISTORY — PX: PORTACATH PLACEMENT: SHX2246

## 2011-02-26 SURGERY — INSERTION, TUNNELED CENTRAL VENOUS DEVICE, WITH PORT
Anesthesia: Monitor Anesthesia Care | Site: Chest | Laterality: Right | Wound class: Clean

## 2011-02-26 MED ORDER — CEFAZOLIN SODIUM 1-5 GM-% IV SOLN
INTRAVENOUS | Status: AC
  Filled 2011-02-26: qty 100

## 2011-02-26 MED ORDER — SODIUM CHLORIDE 0.9 % IJ SOLN: 3.0000 mL | Freq: Two times a day (BID) | INTRAMUSCULAR | Status: DC

## 2011-02-26 MED ORDER — SODIUM CHLORIDE 0.9 % IJ SOLN: 3.0000 mL | INTRAMUSCULAR | Status: DC | PRN

## 2011-02-26 MED ORDER — ACETAMINOPHEN 650 MG RE SUPP: 650.0000 mg | RECTAL | Status: DC | PRN

## 2011-02-26 MED ORDER — LIDOCAINE HCL (PF) 1 % IJ SOLN
INTRAMUSCULAR | Status: DC | PRN
  Administered 2011-02-26: 23 mL

## 2011-02-26 MED ORDER — ONDANSETRON HCL 4 MG/2ML IJ SOLN: 4.0000 mg | Freq: Four times a day (QID) | INTRAMUSCULAR | Status: DC | PRN

## 2011-02-26 MED ORDER — LACTATED RINGERS IV SOLN
INTRAVENOUS | Status: DC | PRN
  Administered 2011-02-26: 07:00:00 via INTRAVENOUS

## 2011-02-26 MED ORDER — OXYCODONE HCL 5 MG PO TABS: 5.0000 mg | ORAL_TABLET | Freq: Four times a day (QID) | ORAL | Status: AC | PRN

## 2011-02-26 MED ORDER — SODIUM CHLORIDE 0.9 % IR SOLN
Status: DC | PRN
  Administered 2011-02-26: 1000 mL

## 2011-02-26 MED ORDER — PROPOFOL 10 MG/ML IV EMUL
INTRAVENOUS | Status: DC | PRN
  Administered 2011-02-26: 100 ug/kg/min via INTRAVENOUS

## 2011-02-26 MED ORDER — OXYCODONE HCL 5 MG PO TABS: 5.0000 mg | ORAL_TABLET | ORAL | Status: DC | PRN

## 2011-02-26 MED ORDER — MORPHINE SULFATE 2 MG/ML IJ SOLN: 2.0000 mg | INTRAMUSCULAR | Status: DC | PRN

## 2011-02-26 MED ORDER — HYDROMORPHONE HCL PF 1 MG/ML IJ SOLN: 0.2500 mg | INTRAMUSCULAR | Status: DC | PRN

## 2011-02-26 MED ORDER — SODIUM CHLORIDE 0.9 % IR SOLN
Status: DC | PRN
  Administered 2011-02-26: 08:00:00

## 2011-02-26 MED ORDER — ACETAMINOPHEN 325 MG PO TABS: 650.0000 mg | ORAL_TABLET | ORAL | Status: DC | PRN

## 2011-02-26 MED ORDER — PROMETHAZINE HCL 25 MG/ML IJ SOLN: 12.5000 mg | Freq: Four times a day (QID) | INTRAMUSCULAR | Status: DC | PRN

## 2011-02-26 MED ORDER — CEFAZOLIN SODIUM 1-5 GM-% IV SOLN
INTRAVENOUS | Status: DC | PRN
  Administered 2011-02-26: 2 g via INTRAVENOUS

## 2011-02-26 MED ORDER — FENTANYL CITRATE 0.05 MG/ML IJ SOLN
INTRAMUSCULAR | Status: DC | PRN
  Administered 2011-02-26: 25 ug via INTRAVENOUS
  Administered 2011-02-26: 50 ug via INTRAVENOUS
  Administered 2011-02-26: 25 ug via INTRAVENOUS

## 2011-02-26 MED ORDER — SODIUM CHLORIDE 0.9 % IV SOLN: 250.0000 mL | INTRAVENOUS | Status: DC | PRN

## 2011-02-26 MED ORDER — HEPARIN SOD (PORK) LOCK FLUSH 100 UNIT/ML IV SOLN
INTRAVENOUS | Status: DC | PRN
  Administered 2011-02-26: 400 [IU] via INTRAVENOUS

## 2011-02-26 MED ORDER — MIDAZOLAM HCL 5 MG/5ML IJ SOLN
INTRAMUSCULAR | Status: DC | PRN
  Administered 2011-02-26: 2 mg via INTRAVENOUS

## 2011-02-26 MED ORDER — DROPERIDOL 2.5 MG/ML IJ SOLN: 0.6250 mg | INTRAMUSCULAR | Status: DC | PRN

## 2011-02-26 SURGICAL SUPPLY — 55 items
BAG DECANTER FOR FLEXI CONT (MISCELLANEOUS) ×2 IMPLANT
BENZOIN TINCTURE PRP APPL 2/3 (GAUZE/BANDAGES/DRESSINGS) ×2 IMPLANT
BLADE SURG 11 STRL SS (BLADE) ×2 IMPLANT
BLADE SURG 15 STRL LF DISP TIS (BLADE) ×1 IMPLANT
BLADE SURG 15 STRL SS (BLADE) ×1
CHLORAPREP W/TINT 26ML (MISCELLANEOUS) ×2 IMPLANT
CLOTH BEACON ORANGE TIMEOUT ST (SAFETY) ×2 IMPLANT
COVER SURGICAL LIGHT HANDLE (MISCELLANEOUS) ×4 IMPLANT
CRADLE DONUT ADULT HEAD (MISCELLANEOUS) ×2 IMPLANT
DECANTER SPIKE VIAL GLASS SM (MISCELLANEOUS) ×2 IMPLANT
DRAPE C-ARM 42X72 X-RAY (DRAPES) ×2 IMPLANT
DRAPE LAPAROSCOPIC ABDOMINAL (DRAPES) ×2 IMPLANT
DRSG TEGADERM 4X4.75 (GAUZE/BANDAGES/DRESSINGS) ×2 IMPLANT
ELECT CAUTERY BLADE 6.4 (BLADE) ×2 IMPLANT
ELECT REM PT RETURN 9FT ADLT (ELECTROSURGICAL) ×2
ELECTRODE REM PT RTRN 9FT ADLT (ELECTROSURGICAL) ×1 IMPLANT
GAUZE SPONGE 2X2 8PLY STRL LF (GAUZE/BANDAGES/DRESSINGS) ×1 IMPLANT
GAUZE SPONGE 4X4 16PLY XRAY LF (GAUZE/BANDAGES/DRESSINGS) ×2 IMPLANT
GLOVE BIOGEL PI IND STRL 7.0 (GLOVE) ×1 IMPLANT
GLOVE BIOGEL PI INDICATOR 7.0 (GLOVE) ×1
GLOVE ECLIPSE 6.5 STRL STRAW (GLOVE) ×2 IMPLANT
GLOVE SURG SIGNA 7.5 PF LTX (GLOVE) ×2 IMPLANT
GOWN PREVENTION PLUS XLARGE (GOWN DISPOSABLE) ×2 IMPLANT
GOWN STRL NON-REIN LRG LVL3 (GOWN DISPOSABLE) ×2 IMPLANT
INTRODUCER COOK 11FR (CATHETERS) IMPLANT
KIT BASIN OR (CUSTOM PROCEDURE TRAY) ×2 IMPLANT
KIT PORT POWER 9.6FR MRI PREA (Catheter) IMPLANT
KIT PORT POWER ISP 8FR (Catheter) IMPLANT
KIT POWER CATH 8FR (Catheter) ×2 IMPLANT
KIT ROOM TURNOVER OR (KITS) ×2 IMPLANT
NEEDLE HYPO 25GX1X1/2 BEV (NEEDLE) IMPLANT
NS IRRIG 1000ML POUR BTL (IV SOLUTION) ×2 IMPLANT
PACK SURGICAL SETUP 50X90 (CUSTOM PROCEDURE TRAY) ×2 IMPLANT
PAD ARMBOARD 7.5X6 YLW CONV (MISCELLANEOUS) ×4 IMPLANT
PENCIL BUTTON HOLSTER BLD 10FT (ELECTRODE) ×2 IMPLANT
PENCIL FOOT CONTROL (ELECTRODE) ×2 IMPLANT
SET INTRODUCER 12FR PACEMAKER (SHEATH) IMPLANT
SET SHEATH INTRODUCER 10FR (MISCELLANEOUS) IMPLANT
SHEATH COOK PEEL AWAY SET 9F (SHEATH) ×2 IMPLANT
SPONGE GAUZE 2X2 12PLY UNSTER (GAUZE/BANDAGES/DRESSINGS) ×2 IMPLANT
SPONGE GAUZE 2X2 STER 10/PKG (GAUZE/BANDAGES/DRESSINGS) ×1
STERI STRIP BROWN 1/4X3 R155 (GAUZE/BANDAGES/DRESSINGS) ×2 IMPLANT
STRIP CLOSURE SKIN 1/2X4 (GAUZE/BANDAGES/DRESSINGS) ×2 IMPLANT
SUT MNCRL AB 4-0 PS2 18 (SUTURE) ×2 IMPLANT
SUT PROLENE 2 0 SH 30 (SUTURE) ×2 IMPLANT
SUT SILK 2 0 (SUTURE)
SUT SILK 2-0 18XBRD TIE 12 (SUTURE) IMPLANT
SUT VIC AB 3-0 SH 27 (SUTURE) ×1
SUT VIC AB 3-0 SH 27XBRD (SUTURE) ×1 IMPLANT
SYR 20ML ECCENTRIC (SYRINGE) ×4 IMPLANT
SYR 5ML LUER SLIP (SYRINGE) ×2 IMPLANT
SYR CONTROL 10ML LL (SYRINGE) IMPLANT
TOWEL OR 17X24 6PK STRL BLUE (TOWEL DISPOSABLE) ×2 IMPLANT
TOWEL OR 17X26 10 PK STRL BLUE (TOWEL DISPOSABLE) ×2 IMPLANT
WATER STERILE IRR 1000ML POUR (IV SOLUTION) IMPLANT

## 2011-02-26 NOTE — H&P (View-Only) (Signed)
Patient ID: [name], female   DOB: [birth year], 50 y.o.   MRN: [id number]  Chief Complaint  Patient presents with  . Other    left breast cancer    HPI [name] is a 50 y.o. female.   HPI She is referred by Dr. Anna Genre for evaluation of a left breast mass. She recently palpated a mass in her left breast and presented for mammograms. She has has had biopsy of the mass as well as a lymph node in her left axilla which both showed invasive cancer. She has had no previous history of breast biopsies or breast malignancy. She denies discharge from her nipples. She reports pain in the breast. Past Medical History  Diagnosis Date  . Depression   . Hypothyroidism   . Sleep apnea   . Congestive heart failure   . Breast cancer   . Emphysema of lung   . GERD (gastroesophageal reflux disease)   . Heart murmur   . Hypertension     Past Surgical History  Procedure Date  . Abdominal hysterectomy   . Tah-   . Slapingooophorectomy, right remote    Family History  Problem Relation Age of Onset  . Leukemia Mother     Social History History  Substance Use Topics  . Smoking status: Current Everyday Smoker -- 1.0 packs/day  . Smokeless tobacco: Not on file  . Alcohol Use: 0.0 oz/week    5-7 Glasses of wine per week    Allergies  Allergen Reactions  . Aspirin Hives  . Morphine And Related Hives and Swelling  . Penicillins Hives    Current Outpatient Prescriptions  Medication Sig Dispense Refill  . carvedilol (COREG) 25 MG tablet Take 25 mg by mouth 2 (two) times daily with a meal.        . furosemide (LASIX) 40 MG tablet Take 40 mg by mouth daily. 3 in Am  2 in pm       . hydrochlorothiazide (HYDRODIURIL) 25 MG tablet Take 25 mg by mouth daily.        [name] levothyroxine (SYNTHROID, LEVOTHROID) 100 MCG tablet Take 100 mcg by mouth daily.        [name] lisinopril (PRINIVIL,ZESTRIL) 20 MG tablet Take 20 mg by mouth daily.        . magnesium oxide (MAG-OX) 400 MG tablet Take 800 mg by  mouth daily.        . naproxen sodium (ANAPROX) 220 MG tablet Take 220 mg by mouth 2 (two) times daily with a meal.        . nitroGLYCERIN (NITROSTAT) 0.4 MG SL tablet Place 0.4 mg under the tongue every 5 (five) minutes as needed.        [name] omeprazole (PRILOSEC) 20 MG capsule Take 20 mg by mouth daily.        [name] oxyCODONE-acetaminophen (PERCOCET) 5-325 MG per tablet Take 1 tablet by mouth 2 (two) times daily as needed.        . potassium chloride SA (K-DUR,KLOR-CON) 20 MEQ tablet Take 20 mEq by mouth 2 (two) times daily.        . sertraline (ZOLOFT) 100 MG tablet Take 100 mg by mouth daily.        [name] zolpidem (AMBIEN) 10 MG tablet Take 10 mg by mouth at bedtime as needed.          Review of Systems Review of Systems  Constitutional: Positive for fatigue. Negative for fever, chills and unexpected weight change.  HENT: Positive for tinnitus. Negative for hearing loss, congestion, sore throat, trouble swallowing and voice change.   Eyes: Negative for visual disturbance.  Respiratory: Negative for cough and wheezing.   Cardiovascular: Positive for chest pain and leg swelling. Negative for palpitations.  Gastrointestinal: Negative for nausea, vomiting, abdominal pain, diarrhea, constipation, blood in stool, abdominal distention and anal bleeding.  Genitourinary: Negative for hematuria, vaginal bleeding and difficulty urinating.  Musculoskeletal: Positive for back pain. Negative for arthralgias.  Skin: Negative for rash and wound.  Neurological: Positive for numbness and headaches. Negative for seizures and syncope.  Hematological: Negative for adenopathy. Does not bruise/bleed easily.  Psychiatric/Behavioral: Positive for agitation. Negative for confusion.    Blood pressure 115/73, pulse 77.  Physical Exam Physical Exam  Constitutional: She is oriented to person, place, and time. She appears well-developed and well-nourished. No distress.  HENT:  Head: Normocephalic and atraumatic.  Right  Ear: External ear normal.  Left Ear: External ear normal.  Nose: Nose normal.  Mouth/Throat: Oropharynx is clear and moist.  Eyes: Conjunctivae are normal. Pupils are equal, round, and reactive to light. No scleral icterus.  Neck: Normal range of motion. Neck supple. No tracheal deviation present. No thyromegaly present.  Cardiovascular: Normal rate, regular rhythm, normal heart sounds and intact distal pulses.   No murmur heard. Pulmonary/Chest: Effort normal and breath sounds normal. No respiratory distress. She has no wheezes. She has no rales.  Abdominal: Bowel sounds are normal. She exhibits no distension. There is no tenderness.  Musculoskeletal: Normal range of motion. She exhibits no edema.  Lymphadenopathy:    She has no cervical adenopathy.  Neurological: She is alert and oriented to person, place, and time.  Skin: Skin is warm and dry. No rash noted. No erythema.  Psychiatric: Her behavior is normal. Judgment normal.  Patient has a firm, hard, mobile mass in the lower outer quadrant of the left breast. This is mildly tender. There are no other breast masses in either breast. I cannot appreciate any axillary, supraclavicular, or cervical adenopathy.  Data Reviewed The patient's MRI demonstrates that the mass is approximately 4.8 x 4.9 x 4.8 cm. 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 She also has a biopsy of the lymph node which was positive for metastatic ductal carcinoma. She is ER positive and HER-2 positive  Assessment    Patient with a noted positive large left breast cancer    Plan    She was discussed in the multidisciplinary breast conference. Recommendations are for neoadjuvant therapy. We discussed this with her in detail and she is in agreement. She will need a Port-A-Cath placed for initiation of her chemotherapy.  I discussed the risk of the procedure with her which includes, but is not limited to, bleeding, infection, pneumothorax, etc. She  understands and wishes to proceed with port placement. Likely of success with this procedure is good. Hopefully she can be down staged and then undergo a left breast lumpectomy and axillary. dissection       Amairani Shuey A 02/10/2011, 2:38 PM

## 2011-02-26 NOTE — Interval H&P Note (Signed)
History and Physical Interval Note:  Patient with left breast cancer, here for port insertion to start IV chemotherapy.  She has no changes in her H & P since I saw her last  02/26/2011 6:54 AM  Memory Argue  has presented today for surgery, with the diagnosis of breast cancer  The various methods of treatment have been discussed with the patient and family. After consideration of risks, benefits and other options for treatment, the patient has consented to  Procedure(s): INSERTION PORT-A-CATH as a surgical intervention .  The patients' history has been reviewed, patient examined, no change in status, stable for surgery.  I have reviewed the patients' chart and labs.  Questions were answered to the patient's satisfaction.     Neeley Sedivy A

## 2011-02-26 NOTE — Anesthesia Procedure Notes (Addendum)
Procedure Name: MAC Date/Time: 02/26/2011 7:33 AM Performed by: Einar Crow Pre-anesthesia Checklist: Patient identified, Emergency Drugs available, Suction available and Patient being monitored Patient Re-evaluated:Patient Re-evaluated prior to inductionOxygen Delivery Method: Nasal Cannula Intubation Type: IV induction

## 2011-02-26 NOTE — Anesthesia Preprocedure Evaluation (Addendum)
Anesthesia Evaluation  Patient identified by MRN, date of birth, ID band Patient awake    Reviewed: Allergy & Precautions, H&P , NPO status , Patient's Chart, lab work & pertinent test results, reviewed documented beta blocker date and time   History of Anesthesia Complications Negative for: history of anesthetic complications  Airway Mallampati: II TM Distance: >3 FB Neck ROM: Full    Dental  (+) Teeth Intact   Pulmonary sleep apnea , COPD clear to auscultation        Cardiovascular hypertension, Pt. on medications +CHF + Valvular Problems/Murmurs MR Regular Normal- Systolic murmurs    Neuro/Psych PSYCHIATRIC DISORDERS Depression    GI/Hepatic GERD-  Controlled,  Endo/Other  Hypothyroidism   Renal/GU      Musculoskeletal   Abdominal   Peds  Hematology   Anesthesia Other Findings   Reproductive/Obstetrics                          Anesthesia Physical Anesthesia Plan  ASA: III  Anesthesia Plan: MAC   Post-op Pain Management:    Induction:   Airway Management Planned: Simple Face Mask and Nasal Cannula  Additional Equipment:   Intra-op Plan:   Post-operative Plan:   Informed Consent: I have reviewed the patients History and Physical, chart, labs and discussed the procedure including the risks, benefits and alternatives for the proposed anesthesia with the patient or authorized representative who has indicated his/her understanding and acceptance.     Plan Discussed with: CRNA, Anesthesiologist and Surgeon  Anesthesia Plan Comments:        Anesthesia Quick Evaluation

## 2011-02-26 NOTE — Op Note (Signed)
02/26/2011  8:25 AM  PATIENT:  [name]  50 y.o. female  PRE-OPERATIVE DIAGNOSIS:  breast cancer  POST-OPERATIVE DIAGNOSIS:  breast cancer  PROCEDURE:  Procedure(s): INSERTION PORT-A-CATH RIGHT SUBCLAVIAN VEIN (8FR POWER PORT)  SURGEON:  Surgeon(s): Shelly Rubenstein, MD  PHYSICIAN ASSISTANT:   ASSISTANTS: none   ANESTHESIA:   local and IV sedation  EBL:  Total I/O In: 400 [I.V.:400] Out: 50 [Blood:50]  BLOOD ADMINISTERED:none  DRAINS: none   LOCAL MEDICATIONS USED:  LIDOCAINE 25CC  SPECIMEN:  No Specimen  DISPOSITION OF SPECIMEN:  N/A  COUNTS:  YES  TOURNIQUET:  * No tourniquets in log *  DICTATION: .Dragon Dictation The patient was brought to the operating room and identified as the correct patient. She was placed supine in the operating table and anesthesia was induced. A shoulder roll was placed. The patient's chest and neck were then prepped and draped in the usual sterile fashion. The patient was then placed in the Trendelenburg position. The skin of the right chest and clavicle were anesthetized with lidocaine. The introducer needle was then used to cannulate the right subclavian vein. The vein was easily cannulated but I could not get the guidewire passed easily. I had to recannulate the vein and then I was able to pass the wire. Fluoroscopy confirmed placement in the venous system. I anesthetized the skin further and made a transverse incision with the scalpel. I then created a port pocket with the electrocautery. An 8 [place] power port was then brought to the field. I connected the catheter to the port and it was flushed. I then cut the catheter appropriate length. The introducer sheath and dilator was then passed over the wire into the central venous system. I placed the port into the pocket. I then removed the dilator and wire. I then fed the catheter down the introducer sheath. The sheath was then peeled away leaving the catheter in the central venous system.  Placement again was confirmed with fluoroscopy. I accessed the port and good flush and return were demonstrated. Then again accessed the catheter and instilled with concentrated heparin solution. I then closed the subcutaneous tissue with interrupted 3-0 Vicryl sutures and then closed the skin with a running 4-0 Monocryl suture. Steri-Strips gallstone and Tegaderm were then applied. The patient tolerated the procedure well. All counts were correct. She was then taken in a stable condition to the recovery room.  PLAN OF CARE: Discharge to home after PACU  PATIENT DISPOSITION:  PACU - hemodynamically stable.   Delay start of Pharmacological VTE agent (>24hrs) due to surgical blood loss or risk of bleeding:  {YES/NO/NOT APPLICABLE:20182

## 2011-02-26 NOTE — Preoperative (Signed)
Beta Blockers   Reason not to administer Beta Blockers:Took beta blocker 12/7 am

## 2011-02-26 NOTE — Anesthesia Postprocedure Evaluation (Signed)
Anesthesia Post Note  Patient: [name]  Procedure(s) Performed:  INSERTION PORT-A-CATH  Anesthesia type: MAC  Patient location: PACU  Post pain: Pain level controlled  Post assessment: Patient's Cardiovascular Status Stable  Last Vitals:  Filed Vitals:   02/26/11 0918  BP:   Pulse: 65  Temp:   Resp: 13    Post vital signs: Reviewed and stable  Level of consciousness: sedated  Complications: No apparent anesthesia complications

## 2011-02-26 NOTE — Transfer of Care (Signed)
Immediate Anesthesia Transfer of Care Note  Patient: [name]  Procedure(s) Performed:  INSERTION PORT-A-CATH  Patient Location: PACU  Anesthesia Type: MAC  Level of Consciousness: awake, alert , oriented and patient cooperative  Airway & Oxygen Therapy: Patient Spontanous Breathing and Patient connected to nasal cannula oxygen  Post-op Assessment: Report given to PACU RN, Post -op Vital signs reviewed and stable and Patient moving all extremities  Post vital signs: Reviewed and stable  Complications: No apparent anesthesia complications

## 2011-03-02 ENCOUNTER — Encounter (HOSPITAL_COMMUNITY): Payer: Self-pay | Admitting: Surgery

## 2011-03-03 ENCOUNTER — Encounter: Payer: Self-pay | Admitting: *Deleted

## 2011-03-03 NOTE — Progress Notes (Signed)
Mailed after appt letter to pt. 

## 2011-03-04 ENCOUNTER — Telehealth: Payer: Self-pay | Admitting: *Deleted

## 2011-03-04 ENCOUNTER — Ambulatory Visit (HOSPITAL_BASED_OUTPATIENT_CLINIC_OR_DEPARTMENT_OTHER): Payer: Medicare Other

## 2011-03-04 ENCOUNTER — Other Ambulatory Visit: Payer: Self-pay | Admitting: Oncology

## 2011-03-04 ENCOUNTER — Ambulatory Visit (HOSPITAL_BASED_OUTPATIENT_CLINIC_OR_DEPARTMENT_OTHER): Payer: Medicare Other | Admitting: Oncology

## 2011-03-04 VITALS — BP 129/85 | HR 75 | Temp 98.2°F | Ht 59.0 in | Wt 197.9 lb

## 2011-03-04 DIAGNOSIS — Z170 Estrogen receptor positive status [ER+]: Secondary | ICD-10-CM

## 2011-03-04 DIAGNOSIS — E669 Obesity, unspecified: Secondary | ICD-10-CM | POA: Insufficient documentation

## 2011-03-04 DIAGNOSIS — C50919 Malignant neoplasm of unspecified site of unspecified female breast: Secondary | ICD-10-CM

## 2011-03-04 DIAGNOSIS — F17200 Nicotine dependence, unspecified, uncomplicated: Secondary | ICD-10-CM

## 2011-03-04 DIAGNOSIS — I509 Heart failure, unspecified: Secondary | ICD-10-CM

## 2011-03-04 DIAGNOSIS — C50419 Malignant neoplasm of upper-outer quadrant of unspecified female breast: Secondary | ICD-10-CM

## 2011-03-04 LAB — BASIC METABOLIC PANEL
CO2: 31 mEq/L (ref 19–32)
Chloride: 98 mEq/L (ref 96–112)
Creatinine, Ser: 1.28 mg/dL — ABNORMAL HIGH (ref 0.50–1.10)
Glucose, Bld: 94 mg/dL (ref 70–99)

## 2011-03-04 NOTE — Telephone Encounter (Signed)
gave patient appointment for 02-2011 thru 06-2011 with labs chemo and amy berry printed out calendar and gave to the patient

## 2011-03-04 NOTE — Progress Notes (Signed)
TISH BEGIN    HPI: 50 year old [place] woman followed by Lonie Peak, who noted a change in her left breast approximately one month ago. Dr. Anna Genre set her up for mammography at the [facility] 01/29/2011, and this showed an irregular high-density mass with ill-defined margins in the upper outer quadrant of the left breast. This was palpable and mobile. Ultrasound showed an irregular hypoechoic mass with lobulated margins measuring up to 2.9 cm. One left axillary lymph node was noted to be enlarged at 2.6 cm. There were no other abnormal lymph nodes noted. 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  With this information the patient was set up for bilateral breast MRIs 02/05/2011 showing in the left breast mass in question to measure up to 4.9 cm. There were multiple small nodules without fatty hila in the left axilla, suspicious for metastatic disease there. The right side was unremarkable and there was no suspicious internal mammary adenopathy.  On 02/08/2011 the patient underwent biopsy of the largest left axillary lymph node, and this was positive ([id number]). With this information the patient was evaluated at the multidisciplinary breast cancer clinic NOV 2012 for a definitive treatment plan.  Interval history: Since her last visit here Sharnette has attended chemotherapy school, had her CT scan, PET scan, brain MRI, an echocardiogram. She also had her port placed. She tolerated the procedure without unusual side effects. She is "mad as hell" as having this problem and having to deal with it but she is "going to beat itt".  Past Medical History  Diagnosis Date  . Depression   . Hypothyroidism   . Sleep apnea   . Congestive heart failure   . Breast cancer     . Emphysema of lung   . GERD (gastroesophageal reflux disease)   . Heart murmur   . Hypertension     Past Surgical History  Procedure Date  . Abdominal hysterectomy   . Tah-   . Slapingooophorectomy, right remote  . Portacath placement 02/26/2011    Procedure: INSERTION PORT-A-CATH;  Surgeon: Shelly Rubenstein, MD;  [facility]: [facility];  Service: General;  Laterality: Right;    Family History  Problem Relation Age of Onset  . Leukemia Mother    the patient's father died at the age of 78 from renal failure the patient's mother died at the age of 46 from acute leukemia. The patient has 2 brothers and 2 sisters, there is no history of breast or ovarian cancer in the family to her knowledge.  Gynecologic history: Menarche age 60, menopause 2005, the patient never took hormone replacement. She is GX P3, first pregnancy to term age 89.  Social History: The patient is currently unemployed previously she worked as an Theatre stage manager. She is divorced and lives by herself. Her children are Navika Hoopes, lives in [place] and works for the post office, she is 49 years old; [name], 26 a lives in [place] and works for [employer], and [name],  21, who lives in [place] and works in daycare. The patient has 5 grandchildrenShe attends the [employer] in [place].   She  reports that she has been smoking.  She does not have any smokeless tobacco history on file. She reports that she drinks alcohol. Her drug  history not on file.  Health maintenance:  Cholesterol : "fine"  Bone density : never   Colonoscopy: never  (PAP): not since hysterectomy   Allergies:  Allergies  Allergen Reactions  . Aspirin Hives  . Morphine And Related Hives and Swelling  . Penicillins Hives   , Current Outpatient Prescriptions on File Prior to Visit  Medication Sig Dispense Refill  . carvedilol (COREG) 25 MG tablet Take 25 mg by mouth 2 (two) times daily with a meal.        . furosemide  (LASIX) 40 MG tablet Take 40 mg by mouth daily. 3 in Am  2 in pm       . hydrochlorothiazide (HYDRODIURIL) 25 MG tablet Take 25 mg by mouth daily.        . Ibuprofen-Diphenhydramine HCl (ADVIL PM) 200-25 MG CAPS Take 3 capsules by mouth at bedtime.        [name] levothyroxine (SYNTHROID, LEVOTHROID) 100 MCG tablet Take 100 mcg by mouth daily.        [name] lisinopril (PRINIVIL,ZESTRIL) 20 MG tablet Take 20 mg by mouth daily.        . magnesium oxide (MAG-OX) 400 MG tablet Take 800 mg by mouth daily.        . Melatonin 10 MG TABS Take 3 tablets by mouth at bedtime.        . naproxen sodium (ANAPROX) 220 MG tablet Take 220 mg by mouth 2 (two) times daily as needed. As needed for headaches.      . nicotine (NICODERM CQ - DOSED IN MG/24 HOURS) 21 mg/24hr patch Place 1 patch onto the skin daily.        . nitroGLYCERIN (NITROSTAT) 0.4 MG SL tablet Place 0.4 mg under the tongue every 5 (five) minutes as needed. As needed for chest pain.      [name] omeprazole (PRILOSEC) 20 MG capsule Take 20 mg by mouth 2 (two) times daily.       [name] OVER THE COUNTER MEDICATION Take 3 capsules by mouth every morning. Green Tea Extract OTC.       [name] oxyCODONE (OXY IR/ROXICODONE) 5 MG immediate release tablet Take 1-2 tablets (5-10 mg total) by mouth every 6 (six) hours as needed for pain.  30 tablet  0  . potassium chloride SA (K-DUR,KLOR-CON) 20 MEQ tablet Take 20 mEq by mouth daily.       . sertraline (ZOLOFT) 100 MG tablet Take 100 mg by mouth daily.        [name] zolpidem (AMBIEN) 10 MG tablet Take 10 mg by mouth at bedtime.       [name] oxyCODONE-acetaminophen (PERCOCET) 5-325 MG per tablet Take 1 tablet by mouth 2 (two) times daily as needed. As needed for pain.           ROS she continues with her normal activities. Unfortunately she does continue to smoke although she is making it stronger effort she says to quit. She is not exercising regularly. She has not yet looked in 2 weeks but knows that she is going to be losing her hair. She's  had no unusual headaches visual changes cough phlegm production pleurisy shortness of breath change in bowel or bladder habits rash bleeding or fever. A detailed review of systems was otherwise noncontributory.  Physical Exam:  Blood pressure 129/85, pulse 75, temperature 98.2 F (36.8 C), height 4\' 11"  (1.499 m), weight 197 lb 14.4 oz (89.767 kg).  Sclerae unicteric Oropharynx clear No peripheral adenopathy, and even though  we know there are enlarged lymph nodes in the left axilla I am unable to palpate them. Lungs no rales or rhonchi Heart regular rate and rhythm Abd benign MSK no focal spinal tenderness, no peripheral edema Neuro: nonfocal Breasts:Right breast no suspicious masses left breast there is a firm mass in the upper outer quadrant of the left breast which is mobile, difficult to delineate exactly, but today it feels about 2-3 cm. There is no skin change or nipple retraction associated with this.  LABS   Pending   FILMS: @RISRSLT @ Brain MRI is negative. CT of the Chest and PET scan show only the Left breast mass and several axillary nodes--no evidence of stage 4 disease. The echo shows and EF in the 50-55% range.     Assessment:  50 year old [place] woman with what clinically is a T2 N1-2 or likely stage III invasive ductal carcinoma, strongly estrogen receptor positive, but weakly progesterone receptor positive, with an elevated MIB-1-1 and HER-2 amplification with a CISH ratio of 2.87; in the setting of a history of congestive heart failure, and ongoing tobacco abuse.  Plan:    We discussed her situation in detail and she understands her cancer is essentially stage III. The plan will be to treat her with carboplatin and docetaxel 6 times. I would like to add trastuzumab, and she has now an ejection fraction that wouldn't allow that. However her MUGA in 08 showed an ejection fraction of 38%. I am going to did not give her trastuzumab with the first cycle of chemotherapy,  which will be next Tuesday, and I will coordinate with her cardiologist Dr. Verdis Prime to see if he agrees that we should do the trastuzumab and should we repeat echo every 2 cycles or so. I wrote her prescriptions for dexamethasone TobraDex ondansetron EMLA cream and metoclopramide and wrote down to her how to use these medications. She will be seeing [place] day 1 and day 8 of each treatment. She will keep a diary of her first cycle symptoms. She knows to call for any problems that may develop before the next visit   I should note her sister Clydie Braun, who teaches in women studies at [employer] was present today. Again I have urged Ms. Varma to quit smoking. We will follow up on that question at each visit.   Aliceson Dolbow C 03/04/2011, 10:35 AM

## 2011-03-07 ENCOUNTER — Other Ambulatory Visit: Payer: Self-pay | Admitting: Oncology

## 2011-03-07 DIAGNOSIS — C50919 Malignant neoplasm of unspecified site of unspecified female breast: Secondary | ICD-10-CM

## 2011-03-07 NOTE — Progress Notes (Signed)
ADDENDUM: 07 Mar 2011  Dr. Katrinka Blazing clears pt. To initiate Herceptin and to be reviewed by Dr. Gala Romney; I have forwarded this to Dr Gala Romney, will await his clearance as well before starting herceptin on Ms Mccaughey   I would proceed with therapy and recheck her echo in 3 month intervals . It would also be ok for Dan to be involved since you are studying this.  Garnette Scheuermann   On Mar 04, 2011, at 1:36 PM, "Joslyne Marshburn, Gus" [email or web address]> wrote: Hank I need your help on this 50 year-old [place] woman you picked up from [employer]. She has a history of CHF and MUGA in 2008 showed an EF of 38%. She now has a new diagnosis of breast cancer, HER-2 amplified, and I would like to give her trastuzumab (Herceptin)-it will cut her risk of recurrence in half. I rechecked an echo and it came back as 50-55%. Could you review her case and let me know if I can start her on trastuzumab, and if so how often we should be rechecking her EF?   By the way, we are doing a study with Jesusita Oka BenSimhon who is evaluating all our HER-2 positive patients for subtle wall-velocity changes that may anticipate EF drops-if you prefer I can refer her to him and let him deal with her serial studies   Raymond Gurney "Gus" Jessaca Philippi MD Medical Oncology and Hematology [facility] [address] [place], [place] [address] Tel. [phone]    Fax. [phone]

## 2011-03-08 ENCOUNTER — Other Ambulatory Visit: Payer: Self-pay | Admitting: Oncology

## 2011-03-08 ENCOUNTER — Other Ambulatory Visit: Payer: Self-pay | Admitting: *Deleted

## 2011-03-08 DIAGNOSIS — C50919 Malignant neoplasm of unspecified site of unspecified female breast: Secondary | ICD-10-CM

## 2011-03-08 MED ORDER — ONDANSETRON HCL 8 MG PO TABS: 8.0000 mg | ORAL_TABLET | Freq: Two times a day (BID) | ORAL | Status: AC | PRN

## 2011-03-08 MED ORDER — LIDOCAINE-PRILOCAINE 2.5-2.5 % EX CREA: TOPICAL_CREAM | CUTANEOUS | Status: DC | PRN

## 2011-03-08 MED ORDER — DEXAMETHASONE 4 MG PO TABS: 4.0000 mg | ORAL_TABLET | ORAL | Status: AC

## 2011-03-08 MED ORDER — TOBRAMYCIN-DEXAMETHASONE 0.3-0.1 % OP SUSP: 1.0000 [drp] | OPHTHALMIC | Status: AC

## 2011-03-08 MED ORDER — METOCLOPRAMIDE HCL 10 MG PO TABS: 10.0000 mg | ORAL_TABLET | Freq: Four times a day (QID) | ORAL | Status: AC | PRN

## 2011-03-09 ENCOUNTER — Other Ambulatory Visit: Payer: Medicare Other | Admitting: Lab

## 2011-03-09 ENCOUNTER — Ambulatory Visit (HOSPITAL_BASED_OUTPATIENT_CLINIC_OR_DEPARTMENT_OTHER): Payer: Medicare Other

## 2011-03-09 ENCOUNTER — Other Ambulatory Visit: Payer: Self-pay | Admitting: Oncology

## 2011-03-09 VITALS — BP 174/80 | HR 64 | Temp 97.3°F

## 2011-03-09 DIAGNOSIS — C50919 Malignant neoplasm of unspecified site of unspecified female breast: Secondary | ICD-10-CM

## 2011-03-09 DIAGNOSIS — Z5111 Encounter for antineoplastic chemotherapy: Secondary | ICD-10-CM

## 2011-03-09 DIAGNOSIS — Z170 Estrogen receptor positive status [ER+]: Secondary | ICD-10-CM

## 2011-03-09 MED ORDER — SODIUM CHLORIDE 0.9 % IV SOLN
Freq: Once | INTRAVENOUS | Status: AC
  Administered 2011-03-09: 10:00:00 via INTRAVENOUS

## 2011-03-09 MED ORDER — DOCETAXEL CHEMO INJECTION 160 MG/16ML
75.0000 mg/m2 | Freq: Once | INTRAVENOUS | Status: AC
  Administered 2011-03-09: 140 mg via INTRAVENOUS
  Filled 2011-03-09: qty 14

## 2011-03-09 MED ORDER — HEPARIN SOD (PORK) LOCK FLUSH 100 UNIT/ML IV SOLN
500.0000 [IU] | Freq: Once | INTRAVENOUS | Status: AC | PRN
  Administered 2011-03-09: 500 [IU]
  Filled 2011-03-09: qty 5

## 2011-03-09 MED ORDER — SODIUM CHLORIDE 0.9 % IV SOLN
600.0000 mg | Freq: Once | INTRAVENOUS | Status: AC
  Administered 2011-03-09: 600 mg via INTRAVENOUS
  Filled 2011-03-09: qty 60

## 2011-03-09 MED ORDER — ONDANSETRON 16 MG/50ML IVPB (CHCC)
16.0000 mg | Freq: Once | INTRAVENOUS | Status: AC
  Administered 2011-03-09: 16 mg via INTRAVENOUS

## 2011-03-09 MED ORDER — SODIUM CHLORIDE 0.9 % IJ SOLN
10.0000 mL | INTRAMUSCULAR | Status: DC | PRN
  Administered 2011-03-09: 10 mL
  Filled 2011-03-09: qty 10

## 2011-03-09 MED ORDER — DEXAMETHASONE SODIUM PHOSPHATE 4 MG/ML IJ SOLN
20.0000 mg | Freq: Once | INTRAMUSCULAR | Status: AC
  Administered 2011-03-09: 20 mg via INTRAVENOUS

## 2011-03-09 NOTE — Patient Instructions (Addendum)
[facility] Discharge Instructions for Patients Receiving Chemotherapy  Today you received the following chemotherapy agents taxotere/Carboplatin  To help prevent nausea and vomiting after your treatment, we encourage you to take your nausea medication as prescribed by your doctor.  If you develop nausea and vomiting that is not controlled by your nausea medication, call the clinic. If it is after clinic hours your family physician or the after hours number for the clinic or go to the Emergency Department.   BELOW ARE SYMPTOMS THAT SHOULD BE REPORTED IMMEDIATELY:  *FEVER GREATER THAN 100.5 F  *CHILLS WITH OR WITHOUT FEVER  NAUSEA AND VOMITING THAT IS NOT CONTROLLED WITH YOUR NAUSEA MEDICATION  *UNUSUAL SHORTNESS OF BREATH  *UNUSUAL BRUISING OR BLEEDING  TENDERNESS IN MOUTH AND THROAT WITH OR WITHOUT PRESENCE OF ULCERS  *URINARY PROBLEMS  *BOWEL PROBLEMS  UNUSUAL RASH Items with * indicate a potential emergency and should be followed up as soon as possible.  One of the nurses will contact you 24 hours after your treatment. Please let the nurse know about any problems that you may have experienced. Feel free to call the clinic you have any questions or concerns. The clinic phone number is [phone].   I have been informed and understand all the instructions given to me. I know to contact the clinic, my physician, or go to the Emergency Department if any problems should occur. I do not have any questions at this time, but understand that I may call the clinic during office hours   should I have any questions or need assistance in obtaining follow up care.    __________________________________________  _____________  __________ Signature of Patient or Authorized Representative            Date                   Time    __________________________________________ Nurse's Signature

## 2011-03-09 NOTE — Progress Notes (Signed)
1st Taxotere- VSS.  Pt. Educated about s/s reaction.  Rate started at 63cc/hr for 16cc.

## 2011-03-10 ENCOUNTER — Telehealth: Payer: Self-pay | Admitting: *Deleted

## 2011-03-10 ENCOUNTER — Ambulatory Visit (HOSPITAL_BASED_OUTPATIENT_CLINIC_OR_DEPARTMENT_OTHER): Payer: Medicare Other

## 2011-03-10 VITALS — BP 112/61 | HR 69 | Temp 98.9°F

## 2011-03-10 DIAGNOSIS — C50919 Malignant neoplasm of unspecified site of unspecified female breast: Secondary | ICD-10-CM

## 2011-03-10 MED ORDER — PEGFILGRASTIM INJECTION 6 MG/0.6ML
6.0000 mg | Freq: Once | SUBCUTANEOUS | Status: AC
  Administered 2011-03-10: 6 mg via SUBCUTANEOUS
  Filled 2011-03-10: qty 0.6

## 2011-03-10 NOTE — Telephone Encounter (Signed)
Patient here for injection and is doing well.  No pain, no n/v and no complaints per injection nurse.

## 2011-03-22 ENCOUNTER — Other Ambulatory Visit (HOSPITAL_BASED_OUTPATIENT_CLINIC_OR_DEPARTMENT_OTHER): Payer: Medicare Other | Admitting: Lab

## 2011-03-22 ENCOUNTER — Encounter: Payer: Self-pay | Admitting: Physician Assistant

## 2011-03-22 ENCOUNTER — Ambulatory Visit (INDEPENDENT_AMBULATORY_CARE_PROVIDER_SITE_OTHER): Payer: Medicare Other | Admitting: Surgery

## 2011-03-22 ENCOUNTER — Ambulatory Visit (HOSPITAL_BASED_OUTPATIENT_CLINIC_OR_DEPARTMENT_OTHER): Payer: Medicare Other | Admitting: Physician Assistant

## 2011-03-22 ENCOUNTER — Encounter (INDEPENDENT_AMBULATORY_CARE_PROVIDER_SITE_OTHER): Payer: Self-pay | Admitting: Surgery

## 2011-03-22 VITALS — BP 112/80 | HR 68 | Temp 97.5°F | Resp 18 | Ht <= 58 in | Wt 195.0 lb

## 2011-03-22 VITALS — BP 118/77 | HR 99 | Temp 98.9°F | Ht 59.0 in | Wt 193.9 lb

## 2011-03-22 DIAGNOSIS — Z09 Encounter for follow-up examination after completed treatment for conditions other than malignant neoplasm: Secondary | ICD-10-CM

## 2011-03-22 DIAGNOSIS — Z79899 Other long term (current) drug therapy: Secondary | ICD-10-CM

## 2011-03-22 DIAGNOSIS — C50919 Malignant neoplasm of unspecified site of unspecified female breast: Secondary | ICD-10-CM

## 2011-03-22 DIAGNOSIS — F17200 Nicotine dependence, unspecified, uncomplicated: Secondary | ICD-10-CM

## 2011-03-22 DIAGNOSIS — Z170 Estrogen receptor positive status [ER+]: Secondary | ICD-10-CM

## 2011-03-22 LAB — CBC WITH DIFFERENTIAL/PLATELET
BASO%: 0.2 % (ref 0.0–2.0)
Basophils Absolute: 0 10*3/uL (ref 0.0–0.1)
EOS%: 0 % (ref 0.0–7.0)
HCT: 34.5 % — ABNORMAL LOW (ref 34.8–46.6)
HGB: 11.9 g/dL (ref 11.6–15.9)
MCH: 29.3 pg (ref 25.1–34.0)
MCHC: 34.5 g/dL (ref 31.5–36.0)
MCV: 84.8 fL (ref 79.5–101.0)
MONO%: 3.2 % (ref 0.0–14.0)
NEUT%: 58.8 % (ref 38.4–76.8)
RDW: 13.7 % (ref 11.2–14.5)

## 2011-03-22 LAB — COMPREHENSIVE METABOLIC PANEL
AST: 28 U/L (ref 0–37)
Alkaline Phosphatase: 75 U/L (ref 39–117)
BUN: 8 mg/dL (ref 6–23)
Creatinine, Ser: 1.15 mg/dL — ABNORMAL HIGH (ref 0.50–1.10)

## 2011-03-22 MED ORDER — FLUCONAZOLE 100 MG PO TABS: 100.0000 mg | ORAL_TABLET | Freq: Every day | ORAL | Status: AC

## 2011-03-22 MED ORDER — PROCHLORPERAZINE MALEATE 10 MG PO TABS: 10.0000 mg | ORAL_TABLET | Freq: Four times a day (QID) | ORAL | Status: DC | PRN

## 2011-03-22 MED ORDER — MAGIC MOUTHWASH W/LIDOCAINE: 5.0000 mL | Freq: Four times a day (QID) | ORAL | Status: DC | PRN

## 2011-03-22 MED ORDER — LORAZEPAM 0.5 MG PO TABS: 0.5000 mg | ORAL_TABLET | Freq: Two times a day (BID) | ORAL | Status: AC | PRN

## 2011-03-22 NOTE — Progress Notes (Signed)
Subjective:     Patient ID: [name], female   DOB: 1961/01/14, 50 y.o.   MRN: [id number]  HPI She is here for followup of her left breast cancer as well as Port-A-Cath insertion. She has had one treatment chemotherapy which made her very sick but she reports the Port-A-Cath worked well  Review of Systems     Objective:   Physical Exam On exam, the port site is healing well. I examined her left breast in today could not feel the mass    Assessment:     Patient with invasive left breast cancer undergoing neoadjuvant chemotherapy    Plan:     She will continue to be followed at the cancer center. I will see her back in April to hopefully then plan her definitive surgery

## 2011-03-22 NOTE — Progress Notes (Signed)
Hematology and Oncology Follow Up Visit  [name] [id number] 03-12-1961 50 y.o. 03/22/2011    HPI: 50 year old [place] woman followed by Lonie Peak, who noted a change in her left breast approximately one month ago.   Dr. Anna Genre set her up for mammography at the [facility] 01/29/2011, and this showed an irregular high-density mass with ill-defined margins in the upper outer quadrant of the left breast. This was palpable and mobile. Ultrasound showed an irregular hypoechoic mass with lobulated margins measuring up to 2.9 cm. One left axillary lymph node was noted to be enlarged at 2.6 cm. There were no other abnormal lymph nodes noted.   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   With this information the patient was set up for bilateral breast MRIs 02/05/2011 showing in the left breast mass in question to measure up to 4.9 cm. There were multiple small nodules without fatty hila in the left axilla, suspicious for metastatic disease there. The right side was unremarkable and there was no suspicious internal mammary adenopathy .  On 02/08/2011 the patient underwent biopsy of the largest left axillary lymph node, and this was positive ([id number]). With this information the patient was evaluated at the multidisciplinary breast cancer clinic NOV 2012 for a definitive treatment plan.  Now being treated in neoadjuvant setting with q. 3 week docetaxel and carboplatin, the plan being to add trastuzumab as well following cardiology clearance.   Interim History:    [name] returns today accompanied by her sister for assessment of chemotoxicity on day 14 cycle 1 of docetaxel/carboplatin given in the neoadjuvant setting for her locally advanced left breast  carcinoma. Kiandra tells me she was "sick as a dog" following chemotherapy 2 weeks ago. She had nausea, emesis, and diarrhea. This was despite the antinausea regimen which included dexamethasone, ondansetron, and metoclopramide. The diarrhea began on day 2 or 3, and we wondered whether or not this could be secondary to the metoclopramide. She did have some abdominal cramping, and fortunately this has resolved. She also had some problems with hemorrhoids which has improved.  The patient had no signs of numbness or tingling in the upper or lower extremities. Her mouth is extremely sore and her "tongue is cracked". She's been utilizing nystatin suspension with minimal results. No skin changes, nailbed changes, or nail bed sensitivity noted. No excessive tearing. The patient continues to use TobraDex eyedrops as instructed.  [name] has had a difficult time sleeping. She's also had a sensitivity to odors, with even mild scents causing nausea. Even paper towels smell bad she tells me.  A detailed review of systems is otherwise noncontributory as noted below.  Review of Systems: Constitutional:  Fatigue, no weight loss, fever, night sweats Eyes: negative WGN:FAOZ lesions and oral pain Cardiovascular: no chest pain or dyspnea on exertion Respiratory: no cough, shortness of breath, or wheezing Neurological: negative Dermatological: negative Gastrointestinal: positive for - abdominal pain, diarrhea and nausea/vomiting negative for - melena or swallowing difficulty/pain Genito-Urinary: no dysuria, trouble voiding, or hematuria Hematological and Lymphatic: negative Breast: positive for - palpable abnormality in left breast Musculoskeletal: negative Remaining ROS negative.  Past Medical History   Diagnosis  Date   .  Depression    .  Hypothyroidism    .  Sleep apnea    .  Congestive heart failure    .  Breast cancer    .  Emphysema of lung    .  GERD (gastroesophageal reflux disease)    .  Heart  murmur    .  Hypertension     Past Surgical History   Procedure  Date   .  Abdominal hysterectomy    .  Tah-    .  Slapingooophorectomy, right  remote   .  Portacath placement  02/26/2011     Procedure: INSERTION PORT-A-CATH; Surgeon: Shelly Rubenstein, MD; Location: [facility] OR; Service: General; Laterality: Right;    Family History   Problem  Relation  Age of Onset   .  Leukemia  Mother    the patient's father died at the age of 22 from renal failure the patient's mother died at the age of 21 from acute leukemia. The patient has 2 brothers and 2 sisters, there is no history of breast or ovarian cancer in the family to her knowledge.   Gynecologic history: Menarche age 87, menopause 2005, the patient never took hormone replacement. She is GX P3, first pregnancy to term age 48.   Social History: The patient is currently unemployed previously she worked as an Theatre stage manager. She is divorced and lives by herself. Her children are [name], lives in [place] and works for the post office, she is 49 years old; [name], 26 a lives in [place] and works for [employer], and [name], 21, who lives in [place] and works in daycare. The patient has 5 grandchildrenShe attends the [employer] in [place].    Medications:   I have reviewed the patient's current medications.  Current Outpatient Prescriptions  Medication Sig Dispense Refill  . Alum & Mag Hydroxide-Simeth (MAGIC MOUTHWASH W/LIDOCAINE) SOLN Take 5 mLs by mouth 4 (four) times daily as needed.  240 mL  3  . carvedilol (COREG) 25 MG tablet Take 25 mg by mouth 2 (two) times daily with a meal.        . dexamethasone (DECADRON) 4 MG tablet Take 8 mg by mouth 2 (two) times daily with a meal.        . fluconazole (DIFLUCAN) 100 MG tablet Take 1 tablet (100 mg total) by mouth daily.  7 tablet  3  . furosemide (LASIX) 40 MG tablet Take 40 mg by mouth daily. 3 in Am  2 in pm       . hydrochlorothiazide (HYDRODIURIL) 25 MG  tablet Take 25 mg by mouth daily.        . Ibuprofen-Diphenhydramine HCl (ADVIL PM) 200-25 MG CAPS Take 3 capsules by mouth at bedtime.        [name] levothyroxine (SYNTHROID, LEVOTHROID) 100 MCG tablet Take 100 mcg by mouth daily.        [name] lidocaine-prilocaine (EMLA) cream Apply topically as needed.  30 g  0  . lisinopril (PRINIVIL,ZESTRIL) 20 MG tablet Take 20 mg by mouth daily.        [name] LORazepam (ATIVAN) 0.5 MG tablet Take 1 tablet (0.5 mg total) by mouth 2 (two) times daily as needed (anxiety and/or nausea).  30 tablet  0  . magnesium oxide (MAG-OX) 400 MG tablet Take 800 mg by mouth daily.        . Melatonin 10 MG TABS Take 3 tablets by mouth at bedtime.        . naproxen sodium (ANAPROX) 220 MG tablet Take 220 mg by mouth 2 (two) times daily as  needed. As needed for headaches.      . nicotine (NICODERM CQ - DOSED IN MG/24 HOURS) 21 mg/24hr patch Place 1 patch onto the skin daily.        . nitroGLYCERIN (NITROSTAT) 0.4 MG SL tablet Place 0.4 mg under the tongue every 5 (five) minutes as needed. As needed for chest pain.      [name] omeprazole (PRILOSEC) 20 MG capsule Take 20 mg by mouth 2 (two) times daily.       . ondansetron (ZOFRAN) 8 MG tablet Take by mouth every 12 (twelve) hours as needed.        [name] OVER THE COUNTER MEDICATION Take 3 capsules by mouth every morning. Green Tea Extract OTC.       [name] oxyCODONE-acetaminophen (PERCOCET) 5-325 MG per tablet Take 1 tablet by mouth 2 (two) times daily as needed. As needed for pain.      . potassium chloride SA (K-DUR,KLOR-CON) 20 MEQ tablet Take 20 mEq by mouth daily.       . prochlorperazine (COMPAZINE) 10 MG tablet Take 1 tablet (10 mg total) by mouth every 6 (six) hours as needed.  30 tablet  3  . sertraline (ZOLOFT) 100 MG tablet Take 100 mg by mouth daily.        [name] tobramycin-dexamethasone (TOBRADEX) ophthalmic solution Place 1 drop into both eyes every 4 (four) hours while awake.        . zolpidem (AMBIEN) 10 MG tablet Take 10 mg by mouth at  bedtime.       . metoCLOPramide (REGLAN) 10 MG tablet Take 10 mg by mouth 4 (four) times daily.          Allergies:  Allergies  Allergen Reactions  . Aspirin Hives  . Morphine And Related Hives and Swelling  . Penicillins Hives    Physical Exam: Filed Vitals:   03/22/11 0859  BP: 118/77  Pulse: 99  Temp: 98.9 F (37.2 C)   HEENT:  Sclerae anicteric, conjunctivae pink.  Oropharynx with a white coating on the tongue and buccal mucosa. Cracking noted along the edges of the tongue. No Frank ulcerations noted.   Nodes:  No cervical, supraclavicular, or axillary lymphadenopathy palpated.  Breast Exam:  Deferred Lungs:  Clear to auscultation bilaterally.  No crackles, rhonchi, or wheezes.   Heart:  Regular rate and rhythm.   Abdomen:  Soft, nontender.  Positive bowel sounds.  No organomegaly or masses palpated.   Musculoskeletal:  No focal spinal tenderness to palpation.  Extremities:  Benign.  No peripheral edema or cyanosis.   Skin:  Benign.   Neuro:  Nonfocal.   Lab Results: Lab Results  Component Value Date   WBC 5.5 03/22/2011   HGB 11.9 03/22/2011   HCT 34.5* 03/22/2011   MCV 84.8 03/22/2011   PLT 124* 03/22/2011   NEUTROABS 3.2 03/22/2011     Chemistry      Component Value Date/Time   NA 140 03/04/2011 1056   K 3.5 03/04/2011 1056   CL 98 03/04/2011 1056   CO2 31 03/04/2011 1056   BUN 15 03/04/2011 1056   CREATININE 1.28* 03/04/2011 1056      Component Value Date/Time   CALCIUM 10.1 03/04/2011 1056   ALKPHOS 73 02/10/2011 1204   AST 25 02/10/2011 1204   ALT 22 02/10/2011 1204   BILITOT 0.2* 02/10/2011 1204        Radiological Studies:  Dg Chest 2 View  02/23/2011  *RADIOLOGY REPORT*  Clinical Data: Preop for  Port-A-Cath placement, breast cancer  CHEST - 2 VIEW  Comparison: Report 01/01/2003 no images available  Findings: Borderline cardiomegaly noted.  Probable chronic mild bronchitic changes.  No acute infiltrate or pulmonary edema. Bony thorax is  unremarkable.  IMPRESSION: No acute infiltrate or edema.  Probable chronic mild bronchitic changes.  Original Report Authenticated By: Natasha Mead, M.D.   Ct Head W Wo Contrast  02/24/2011  *RADIOLOGY REPORT*  Clinical Data:  Breast cancer, staging  CT HEAD WITHOUT AND WITH CONTRAST  Technique:  Contiguous axial images were obtained from the base of the skull through the vertex without and with intravenous contrast  Contrast: OMNIPAQUE IOHEXOL 300 MG/ML IV SOLN  Comparison:  PET scan performed at the same date.  Findings:  The brain has a normal appearance without evidence for hemorrhage, acute infarction, hydrocephalus or mass lesion.  There is no extra axial fluid collection.  The skull and paranasal sinuses are normal.  Following contrast infusion there is a normal enhancement pattern and no mass lesion is identified.  There is no evidence for metastatic disease to the brain or skull on this CT examination.  IMPRESSION: Normal CT of the head without and with contrast.  Original Report Authenticated By: Elsie Stain, M.D.   Ct Chest W Contrast  02/24/2011  *RADIOLOGY REPORT*  Clinical Data: Stage III breast cancer.  Evaluate for metastatic disease.  CT CHEST WITH CONTRAST  Technique:  Multidetector CT imaging of the chest was performed following the standard protocol during bolus administration of intravenous contrast.  Contrast: OMNIPAQUE IOHEXOL 300 MG/ML IV SOLN  Comparison: 02/05/2011  Findings: No enlarged supraclavicular lymph nodes.  There is no enlarged mediastinal or hilar lymph nodes.  No pericardial or pleural effusions identified.  Bilobed mass within the periphery of the left breast is identified and measures approximately 2.0 x 2.6 cm, image 25.  There are several enlarged left axillary lymph nodes which exhibit malignant range FDG uptake on today's PET CT.  The largest has a short axis diameter 1.2 cm, image 17.  No enlarged supraclavicular adenopathy.  There is no enlarged  mediastinal or hilar adenopathy.  No pericardial or pleural effusion.  The lungs are clear.  Trachea appears patent and is midline.  Review of the visualized osseous structures is unremarkable.  Limited imaging through the upper abdomen shows a nodule within the right adrenal gland. This measures 1.3 cm and may represent a small adenoma.  On PET CT from today there is no malignant range FDG uptake within this nodule.  A noncontrast MRI of the upper abdomen may provide a more specific assessment of this nodule.  Review of the visualized osseous structures is negative for lytic or sclerotic lesion.  IMPRESSION:  1.  Left breast mass consistent with primary breast carcinoma.  2.  Left axillary lymph node metastases.  Original Report Authenticated By: Rosealee Albee, M.D.   Nm Pet Image Initial (pi) Skull Base To Thigh  02/24/2011  *RADIOLOGY REPORT*  Clinical Data:  Initial treatment strategy for stage III breast cancer.  NUCLEAR MEDICINE PET CT INITIAL (PI) SKULL BASE TO THIGH  Technique:  16.9 mCi F-18 FDG was injected intravenously via the right antecubital fossa.  Full-ring PET imaging was performed from the skull base through the mid-thighs 58  minutes after injection. CT data was obtained and used for attenuation correction and anatomic localization only.  (This was not acquired as a diagnostic CT examination.)  Fasting Blood Glucose:  100  Patient Weight:  200 pounds.  Comparison:  None  Findings:  No hypermetabolic cervical lymph nodes identified.  There are no hypermetabolic supraclavicular lymph nodes. Multiple hypermetabolic left axillary lymph nodes are present.  Left axillary lymph node measures 1 cm, image 64 and has an SUV max equal to 6.2.  Bilobed mass within the 3 o'clock position of the left breast measures 3.9 cm and has an SUV max equal to 9.0.  No hypermetabolic internal mammary lymph nodes.  There is no hypermetabolic mediastinal or hilar lymph nodes.  No pericardial or pleural effusion  identified.  No pulmonary nodules or masses identified.  There is no malignant range FDG uptake within the liver.  There is no malignant range FDG uptake within the adrenal glands or the pancreas.  The spleen appears normal.  No hypermetabolic upper abdominal lymph nodes.  There is no pelvic or inguinal adenopathy.  Urinary bladder appears normal.  Review of the visualized bony structures is unremarkable.  IMPRESSION:  1.  Malignant range FDG uptake is associated with the mass in the periphery of the left breast. 2.  Hypermetabolic left axillary lymph nodes consistent with metastatic adenopathy.  Original Report Authenticated By: Rosealee Albee, M.D.   Dg Chest Port 1 View  02/26/2011  *RADIOLOGY REPORT*  Clinical Data: Right Port-A-Cath insertion.  PORTABLE CHEST - 1 VIEW  Comparison: 02/23/2011  Findings: Right Port-A-Cath is in place.  The tip is in the SVC. No pneumothorax.  There is cardiomegaly.  No confluent opacities, effusions or edema.  No acute bony abnormality.  IMPRESSION: Right Port-A-Cath tip in the SVC.  No pneumothorax.  Original Report Authenticated By: Cyndie Chime, M.D.   Dg Fluoro Guide Cv Line-no Report  02/26/2011  CLINICAL DATA: port insertion   FLOURO GUIDE CV LINE  Fluoroscopy was utilized by the requesting physician.  No radiographic  interpretation.       Assessment:  50 year old [place] woman with what clinically is a T2 N1-2 or likely stage III invasive ductal carcinoma, strongly estrogen receptor positive, but weakly progesterone receptor positive, with an elevated MIB-1-1 and HER-2 amplification with a CISH ratio of 2.87  1. Being treat neoadjuvantly with docetaxel and carboplatin given every 3 weeks x 6 cycles  2.  History of congestive heart failure, initiation of trastuzumab pending cardiology approval.  3.  Comorbidities include continued tobacco usage, hypertension, and GERD.  Plan:  Jeanifer is finally beginning to feel a little better, 2 weeks after her  first dose of docetaxel and carboplatin. We are going to discontinue metoclopramide and replace this with prochlorperazine to see if this helps with both the nausea and the diarrhea. She also understands that she can try Imodium as well, one tablet after every 2 loose bowel movements. I did ask that she contact our office if the diarrhea continues for 24-36 hours. Also reiterated the importance of keeping herself very well hydrated.  Also gave [name] a prescription for lorazepam, a low dose of 0.5 mg to take up to twice daily for nausea and/or anxiety. If this might help her sleep as well.  And will take Diflucan, 100 mg daily, for 7 days. I also gave her prescription for Magic mouthwash with lidocaine to help with oral discomfort.  I will see Lamees back next week on January 8 in anticipation of her second dose of chemotherapy. We were reviewed with Dr. Darnelle Catalan at that point, and decide whether or not to initiate trastuzumab as previously discussed.   This plan was reviewed with the  patient, who voices understanding and agreement.  She knows to call with any changes or problems.    Dorion Petillo, PA-C 03/22/2011

## 2011-03-23 DIAGNOSIS — Z9289 Personal history of other medical treatment: Secondary | ICD-10-CM

## 2011-03-23 HISTORY — DX: Personal history of other medical treatment: Z92.89

## 2011-03-30 ENCOUNTER — Other Ambulatory Visit: Payer: Self-pay

## 2011-03-30 ENCOUNTER — Ambulatory Visit (HOSPITAL_BASED_OUTPATIENT_CLINIC_OR_DEPARTMENT_OTHER): Payer: Medicare Other

## 2011-03-30 ENCOUNTER — Other Ambulatory Visit: Payer: Self-pay | Admitting: *Deleted

## 2011-03-30 ENCOUNTER — Emergency Department (HOSPITAL_COMMUNITY): Payer: Medicare Other

## 2011-03-30 ENCOUNTER — Other Ambulatory Visit: Payer: Medicare Other | Admitting: Lab

## 2011-03-30 ENCOUNTER — Encounter: Payer: Self-pay | Admitting: Physician Assistant

## 2011-03-30 ENCOUNTER — Ambulatory Visit (HOSPITAL_BASED_OUTPATIENT_CLINIC_OR_DEPARTMENT_OTHER): Payer: Medicare Other | Admitting: Physician Assistant

## 2011-03-30 ENCOUNTER — Encounter (HOSPITAL_COMMUNITY): Payer: Self-pay | Admitting: *Deleted

## 2011-03-30 ENCOUNTER — Emergency Department (HOSPITAL_COMMUNITY)
Admission: EM | Admit: 2011-03-30 | Discharge: 2011-03-30 | Disposition: A | Discharge status: Home / Self Care | Payer: Medicare Other | Attending: Emergency Medicine | Admitting: Emergency Medicine

## 2011-03-30 VITALS — BP 120/81 | HR 103 | Temp 98.5°F | Ht <= 58 in | Wt 192.8 lb

## 2011-03-30 VITALS — BP 117/74 | HR 78

## 2011-03-30 DIAGNOSIS — I10 Essential (primary) hypertension: Secondary | ICD-10-CM | POA: Insufficient documentation

## 2011-03-30 DIAGNOSIS — E039 Hypothyroidism, unspecified: Secondary | ICD-10-CM | POA: Insufficient documentation

## 2011-03-30 DIAGNOSIS — Z5111 Encounter for antineoplastic chemotherapy: Secondary | ICD-10-CM

## 2011-03-30 DIAGNOSIS — C50919 Malignant neoplasm of unspecified site of unspecified female breast: Secondary | ICD-10-CM

## 2011-03-30 DIAGNOSIS — R079 Chest pain, unspecified: Secondary | ICD-10-CM | POA: Insufficient documentation

## 2011-03-30 DIAGNOSIS — R071 Chest pain on breathing: Secondary | ICD-10-CM | POA: Diagnosis not present

## 2011-03-30 DIAGNOSIS — R0602 Shortness of breath: Secondary | ICD-10-CM | POA: Diagnosis not present

## 2011-03-30 DIAGNOSIS — Z853 Personal history of malignant neoplasm of breast: Secondary | ICD-10-CM | POA: Insufficient documentation

## 2011-03-30 LAB — CBC WITH DIFFERENTIAL/PLATELET
BASO%: 0 % (ref 0.0–2.0)
EOS%: 0 % (ref 0.0–7.0)
HGB: 11.4 g/dL — ABNORMAL LOW (ref 11.6–15.9)
MCV: 85 fL (ref 79.5–101.0)
MONO%: 7.3 % (ref 0.0–14.0)
NEUT#: 4.9 10*3/uL (ref 1.5–6.5)
NEUT%: 76.5 % (ref 38.4–76.8)
Platelets: 143 10*3/uL — ABNORMAL LOW (ref 145–400)
RBC: 4.08 10*6/uL (ref 3.70–5.45)
RDW: 14 % (ref 11.2–14.5)
WBC: 6.4 10*3/uL (ref 3.9–10.3)
lymph#: 1 10*3/uL (ref 0.9–3.3)

## 2011-03-30 LAB — POCT I-STAT, CHEM 8
Calcium, Ion: 1.25 mmol/L (ref 1.12–1.32)
HCT: 34 % — ABNORMAL LOW (ref 36.0–46.0)
Hemoglobin: 11.6 g/dL — ABNORMAL LOW (ref 12.0–15.0)
TCO2: 27 mmol/L (ref 0–100)

## 2011-03-30 LAB — DIFFERENTIAL
Basophils Absolute: 0 10*3/uL (ref 0.0–0.1)
Basophils Relative: 0 % (ref 0–1)
Eosinophils Absolute: 0 10*3/uL (ref 0.0–0.7)
Eosinophils Relative: 0 % (ref 0–5)
Monocytes Absolute: 0.3 10*3/uL (ref 0.1–1.0)

## 2011-03-30 LAB — POCT I-STAT TROPONIN I: Troponin i, poc: 0 ng/mL (ref 0.00–0.08)

## 2011-03-30 LAB — CBC
HCT: 31.5 % — ABNORMAL LOW (ref 36.0–46.0)
MCHC: 33.3 g/dL (ref 30.0–36.0)
MCV: 85.6 fL (ref 78.0–100.0)
Platelets: 160 10*3/uL (ref 150–400)
RDW: 13.8 % (ref 11.5–15.5)

## 2011-03-30 LAB — D-DIMER, QUANTITATIVE: D-Dimer, Quant: 0.72 ug/mL-FEU — ABNORMAL HIGH (ref 0.00–0.48)

## 2011-03-30 MED ORDER — SODIUM CHLORIDE 0.9 % IV SOLN
600.0000 mg | Freq: Once | INTRAVENOUS | Status: AC
  Administered 2011-03-30: 600 mg via INTRAVENOUS
  Filled 2011-03-30: qty 60

## 2011-03-30 MED ORDER — HEPARIN SOD (PORK) LOCK FLUSH 100 UNIT/ML IV SOLN
INTRAVENOUS | Status: AC
  Administered 2011-03-30: 500 [IU]
  Filled 2011-03-30: qty 5

## 2011-03-30 MED ORDER — DOCETAXEL CHEMO INJECTION 160 MG/16ML
75.0000 mg/m2 | Freq: Once | INTRAVENOUS | Status: AC
  Administered 2011-03-30: 140 mg via INTRAVENOUS
  Filled 2011-03-30: qty 14

## 2011-03-30 MED ORDER — CLOPIDOGREL BISULFATE 75 MG PO TABS
75.0000 mg | ORAL_TABLET | ORAL | Status: AC
  Administered 2011-03-30: 75 mg via ORAL
  Filled 2011-03-30: qty 1

## 2011-03-30 MED ORDER — SODIUM CHLORIDE 0.9 % IJ SOLN
10.0000 mL | INTRAMUSCULAR | Status: DC | PRN
  Filled 2011-03-30: qty 10

## 2011-03-30 MED ORDER — SODIUM CHLORIDE 0.9 % IV SOLN
Freq: Once | INTRAVENOUS | Status: AC
  Administered 2011-03-30: 11:00:00 via INTRAVENOUS

## 2011-03-30 MED ORDER — HEPARIN SOD (PORK) LOCK FLUSH 100 UNIT/ML IV SOLN
500.0000 [IU] | Freq: Once | INTRAVENOUS | Status: AC | PRN
  Filled 2011-03-30: qty 5

## 2011-03-30 MED ORDER — ONDANSETRON 16 MG/50ML IVPB (CHCC)
16.0000 mg | Freq: Once | INTRAVENOUS | Status: AC
  Administered 2011-03-30: 16 mg via INTRAVENOUS

## 2011-03-30 MED ORDER — DEXAMETHASONE SODIUM PHOSPHATE 4 MG/ML IJ SOLN
20.0000 mg | Freq: Once | INTRAMUSCULAR | Status: AC
  Administered 2011-03-30: 20 mg via INTRAVENOUS

## 2011-03-30 MED ORDER — LORAZEPAM 2 MG/ML IJ SOLN
1.0000 mg | Freq: Once | INTRAMUSCULAR | Status: AC
  Administered 2011-03-30: 1 mg via INTRAVENOUS

## 2011-03-30 MED ORDER — NITROGLYCERIN IN D5W 200-5 MCG/ML-% IV SOLN
2.0000 ug/min | Freq: Once | INTRAVENOUS | Status: DC
  Filled 2011-03-30: qty 250

## 2011-03-30 NOTE — Progress Notes (Signed)
Hematology and Oncology Follow Up Visit  [name] [id number] 28-Jun-1960 51 y.o. 03/30/2011    HPI: 51 year old [place] woman followed by Lonie Peak, who noted a change in her left breast approximately one month ago.   Dr. Anna Genre set her up for mammography at the [facility] 01/29/2011, and this showed an irregular high-density mass with ill-defined margins in the upper outer quadrant of the left breast. This was palpable and mobile. Ultrasound showed an irregular hypoechoic mass with lobulated margins measuring up to 2.9 cm. One left axillary lymph node was noted to be enlarged at 2.6 cm. There were no other abnormal lymph nodes noted.   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   With this information the patient was set up for bilateral breast MRIs 02/05/2011 showing in the left breast mass in question to measure up to 4.9 cm. There were multiple small nodules without fatty hila in the left axilla, suspicious for metastatic disease there. The right side was unremarkable and there was no suspicious internal mammary adenopathy .  On 02/08/2011 the patient underwent biopsy of the largest left axillary lymph node, and this was positive ([id number]). With this information the patient was evaluated at the multidisciplinary breast cancer clinic NOV 2012 for a definitive treatment plan.  Now being treated in neoadjuvant setting with q. 3 week docetaxel and carboplatin, the plan being to add trastuzumab as well following cardiology clearance.   Interim History:    [name] returns today accompanied by her sister for followup of her locally advanced left breast carcinoma, due for day 1 cycle 2 of 6 planned every 3 week doses of docetaxel/carboplatin given in the  neoadjuvant setting. She receives Neulasta on day 2 for granulocyte support.  Asiyah tolerated the first cycle of chemotherapy quite poorly, with nausea, emesis, and diarrhea. She also had mouth ulcers, and oral sensitivity. Since her visit on December 31, she has recovered well. She's had only a few episodes of diarrhea which cleared on their own, but has had no nausea or emesis. She's had no additional problems with hemorrhoids, and denies any blood in the stool. She's been utilizing Magic mouthwash which helped significantly, and she currently denies any oral sensitivity.  Otherwise, Lonnie has had no signs of peripheral neuropathy. She is having some hyperpigmentation and dryness of the skin, along with some hyperpigmentation in the nailbeds. There's been some slight tenderness in the nailbeds as well. No drainage or evidence of infection, no loosening of the nail from the nailbed. No excessive tearing. No fevers, chills, or night sweats. She has been sleeping a little better, has been utilizing her Ativan appropriately.  We did adjust her antinausea regimen with her last visit, replacing metoclopramide with prochlorperazine in the hopes that this decreases the diarrhea and also helps with nausea. We spent over half of our 45 minute appointment today counseling [name] regarding her treatment plan, and coordinating care.  A detailed review of systems is otherwise noncontributory as noted below.  Review of Systems: Constitutional:  Fatigue, no weight loss, fever, night sweats Eyes: negative QMV:HQIONGEX Cardiovascular: no chest pain or dyspnea on exertion Respiratory: no cough, shortness of breath, or wheezing Neurological: negative Dermatological:  Positive for hyperpigmentation and dry skin Gastrointestinal: no nausea or emesis, occasional diarrhea negative  for - melena or swallowing difficulty/pain Genito-Urinary: no dysuria, trouble voiding, or hematuria Hematological and Lymphatic:  negative Breast: positive for - palpable abnormality in left breast Musculoskeletal: negative Remaining ROS negative.  Past Medical History   Diagnosis  Date   .  Depression    .  Hypothyroidism    .  Sleep apnea    .  Congestive heart failure    .  Breast cancer    .  Emphysema of lung    .  GERD (gastroesophageal reflux disease)    .  Heart murmur    .  Hypertension     Past Surgical History   Procedure  Date   .  Abdominal hysterectomy    .  Tah-    .  Slapingooophorectomy, right  remote   .  Portacath placement  02/26/2011     Procedure: INSERTION PORT-A-CATH; Surgeon: Shelly Rubenstein, MD; Location: [facility] OR; Service: General; Laterality: Right;    Family History   Problem  Relation  Age of Onset   .  Leukemia  Mother    the patient's father died at the age of 18 from renal failure the patient's mother died at the age of 11 from acute leukemia. The patient has 2 brothers and 2 sisters, there is no history of breast or ovarian cancer in the family to her knowledge.   Gynecologic history: Menarche age 54, menopause 2005, the patient never took hormone replacement. She is GX P3, first pregnancy to term age 75.   Social History: The patient is currently unemployed previously she worked as an Theatre stage manager. She is divorced and lives by herself. Her children are Janett Kamath, lives in [place] and works for the post office, she is 50 years old; [name], 26 a lives in [place] and works for [employer], and [name], 21, who lives in [place] and works in daycare. The patient has 5 grandchildrenShe attends the [employer] in [place].    Medications:   I have reviewed the patient's current medications.  Current Outpatient Prescriptions  Medication Sig Dispense Refill  . Alum & Mag Hydroxide-Simeth (MAGIC MOUTHWASH W/LIDOCAINE) SOLN Take 5 mLs by mouth 4 (four) times daily as needed.  240 mL  3  . carvedilol (COREG) 25 MG tablet Take 25 mg by mouth 2 (two)  times daily with a meal.        . dexamethasone (DECADRON) 4 MG tablet Take 8 mg by mouth 2 (two) times daily with a meal.        . fluconazole (DIFLUCAN) 100 MG tablet Take 1 tablet (100 mg total) by mouth daily.  7 tablet  3  . furosemide (LASIX) 40 MG tablet Take 40 mg by mouth daily. 3 in Am  2 in pm       . hydrochlorothiazide (HYDRODIURIL) 25 MG tablet Take 25 mg by mouth daily.        . Ibuprofen-Diphenhydramine HCl (ADVIL PM) 200-25 MG CAPS Take 3 capsules by mouth at bedtime.        [name] levothyroxine (SYNTHROID, LEVOTHROID) 100 MCG tablet Take 100 mcg by mouth daily.        [name] lidocaine-prilocaine (EMLA) cream Apply topically as needed.  30 g  0  . lisinopril (PRINIVIL,ZESTRIL) 20 MG tablet Take 20 mg by mouth daily.        [name] LORazepam (ATIVAN) 0.5 MG tablet Take 1 tablet (0.5 mg total) by mouth 2 (two) times daily as needed (anxiety and/or  nausea).  30 tablet  0  . magnesium oxide (MAG-OX) 400 MG tablet Take 800 mg by mouth daily.        . Melatonin 10 MG TABS Take 3 tablets by mouth at bedtime.        . metoCLOPramide (REGLAN) 10 MG tablet Take 10 mg by mouth 4 (four) times daily.        . naproxen sodium (ANAPROX) 220 MG tablet Take 220 mg by mouth 2 (two) times daily as needed. As needed for headaches.      . nicotine (NICODERM CQ - DOSED IN MG/24 HOURS) 21 mg/24hr patch Place 1 patch onto the skin daily.        . nitroGLYCERIN (NITROSTAT) 0.4 MG SL tablet Place 0.4 mg under the tongue every 5 (five) minutes as needed. As needed for chest pain.      [name] omeprazole (PRILOSEC) 20 MG capsule Take 20 mg by mouth 2 (two) times daily.       . ondansetron (ZOFRAN) 8 MG tablet Take by mouth every 12 (twelve) hours as needed.        [name] OVER THE COUNTER MEDICATION Take 3 capsules by mouth every morning. Green Tea Extract OTC.       [name] oxyCODONE-acetaminophen (PERCOCET) 5-325 MG per tablet Take 1 tablet by mouth 2 (two) times daily as needed. As needed for pain.      . potassium chloride SA  (K-DUR,KLOR-CON) 20 MEQ tablet Take 20 mEq by mouth daily.       . prochlorperazine (COMPAZINE) 10 MG tablet Take 1 tablet (10 mg total) by mouth every 6 (six) hours as needed.  30 tablet  3  . sertraline (ZOLOFT) 100 MG tablet Take 100 mg by mouth daily.        [name] tobramycin-dexamethasone (TOBRADEX) ophthalmic solution Place 1 drop into both eyes every 4 (four) hours while awake.        . zolpidem (AMBIEN) 10 MG tablet Take 10 mg by mouth at bedtime.         Allergies:  Allergies  Allergen Reactions  . Aspirin Hives  . Morphine And Related Hives and Swelling  . Penicillins Hives    Physical Exam: Filed Vitals:   03/30/11 0955  BP: 120/81  Pulse: 103  Temp: 98.5 F (36.9 C)   HEENT:  Sclerae anicteric, conjunctivae pink.  Oropharynx is clear today, no evidence of oral candidiasis, no ulcerations or mucositis. Nodes:  No cervical, supraclavicular, or axillary lymphadenopathy palpated.  Breast Exam:  Right breast, benign with no masses, skin changes, nipple inversion, or nipple discharge. Port is intact in the right upper chest wall, no erythema, edema, or evidence of infection. Left breast, able to palpate a small mass in the upper outer quadrant of the left breast, approximately 1 cm in diameter today. Lungs:  Clear to auscultation bilaterally.  No crackles, rhonchi, or wheezes.   Heart:  Regular rate and rhythm.   Abdomen:  Soft, nontender.  Positive bowel sounds.  No organomegaly or masses palpated.   Musculoskeletal:  No focal spinal tenderness to palpation.  Extremities:  Benign.  No peripheral edema or cyanosis.   Skin:  Notable for hyperpigmentation in the upper extremities including the nailbeds. No evidence of drainage or infection from the nails. Skin is very dry and irritated in the dorsal surfaces of the hands bilaterally. No frank cracking or peeling noted. Skin is otherwise benign.  Neuro:  Nonfocal.   Lab Results: Lab Results  Component  Value Date   WBC 6.4 03/30/2011    HGB 11.4* 03/30/2011   HCT 34.7* 03/30/2011   MCV 85.0 03/30/2011   PLT 143* 03/30/2011   NEUTROABS 4.9 03/30/2011     Chemistry      Component Value Date/Time   NA 141 03/22/2011 0849   K 3.4* 03/22/2011 0849   CL 101 03/22/2011 0849   CO2 28 03/22/2011 0849   BUN 8 03/22/2011 0849   CREATININE 1.15* 03/22/2011 0849      Component Value Date/Time   CALCIUM 9.1 03/22/2011 0849   ALKPHOS 75 03/22/2011 0849   AST 28 03/22/2011 0849   ALT 33 03/22/2011 0849   BILITOT 0.2* 03/22/2011 0849        Radiological Studies: No studies found.  Assessment:  51 year old [place] woman with what clinically is a T2 N1-2 or likely stage III invasive ductal carcinoma, strongly estrogen receptor positive, but weakly progesterone receptor positive, with an elevated MIB-1-1 and HER-2 amplification with a CISH ratio of 2.87  1. Being treated neoadjuvantly with docetaxel and carboplatin given every 3 weeks x 6 cycles  2.  History of congestive heart failure, initiation of trastuzumab pending cardiology approval.  3.  Comorbidities include continued tobacco usage, hypertension, and GERD.  Plan:  Cristen will proceed to treatment today as scheduled for her second dose of docetaxel and carboplatin. Again we do plan on eventually adding trastuzumab, pending cardiology approval.  As noted above, I have discontinued metoclopramide and replaced this with prochlorperazine. Shanieka did premedicate as instructed with her oral dexamethasone beginning yesterday. She'll continue utilizing lorazepam as needed for nausea, anxiety, and/or sleeplessness. She'll also continue to use Magic mouthwash as well.  I will see Tavonna next week on January 15 for assessment of chemotoxicity. She scheduled for her third dose of chemotherapy on January 29. Soon thereafter we will repeat a breast MRI to assess response to neoadjuvant chemotherapy, and she will see Dr. Darnelle Catalan on February 5 to review those results.  This plan was  reviewed with the patient, who voices understanding and agreement.  She knows to call with any changes or problems.    Margurite Duffy, PA-C 03/30/2011

## 2011-03-30 NOTE — ED Provider Notes (Signed)
History     CSN: [id number]  Arrival date & time 03/30/11  1307   First MD Initiated Contact with Patient 03/30/11 1323      Chief Complaint  Patient presents with  . Chest Pain    (Consider location/radiation/quality/duration/timing/severity/associated sxs/prior treatment) HPI Complains of anterior chest pain substernal nonradiating sudden onset one hour prior to coming here treated with nitroglycerin sublingually which did not help pain , pt then administeresd a "pink liquid" which took pain  from a 10 to a 4 pain not made better or worse by anything feels like "heart attack" she's had in the past no associated nausea shortness of breath or sweating. No other associated symptoms Past Medical History  Diagnosis Date  . Depression   . Hypothyroidism   . Sleep apnea   . Congestive heart failure   . Emphysema of lung   . GERD (gastroesophageal reflux disease)   . Heart murmur   . Hypertension   . Breast cancer     left breast    Past Surgical History  Procedure Date  . Abdominal hysterectomy   . Tah-   . Slapingooophorectomy, right remote  . Portacath placement 02/26/2011    Procedure: INSERTION PORT-A-CATH;  Surgeon: Shelly Rubenstein, MD;  Location: [facility] OR;  Service: General;  Laterality: Right;  . Breast biopsy     left breast    Family History  Problem Relation Age of Onset  . Leukemia Mother   . Heart disease Maternal Grandmother     History  Substance Use Topics  . Smoking status: Former Smoker -- 1.0 packs/day    Quit date: 02/20/2011  . Smokeless tobacco: Not on file  . Alcohol Use: 0.0 oz/week    5-7 Glasses of wine per week    OB History    Grav Para Term Preterm Abortions TAB SAB Ect Mult Living                  Review of Systems  Constitutional: Negative.   HENT: Negative.   Respiratory: Negative.   Cardiovascular: Positive for chest pain.  Gastrointestinal: Negative.   Musculoskeletal: Negative.   Skin: Negative.   Neurological:  Negative.   Hematological: Negative.   Psychiatric/Behavioral: Negative.   All other systems reviewed and are negative.    Allergies  Aspirin; Morphine and related; and Penicillins  Home Medications   Current Outpatient Rx  Name Route Sig Dispense Refill  . MAGIC MOUTHWASH W/LIDOCAINE Oral Take 5 mLs by mouth 4 (four) times daily as needed. 240 mL 3  . CARVEDILOL 25 MG PO TABS Oral Take 25 mg by mouth 2 (two) times daily with a meal.      . DEXAMETHASONE 4 MG PO TABS Oral Take 8 mg by mouth 2 (two) times daily with a meal.      . FLUCONAZOLE 100 MG PO TABS Oral Take 1 tablet (100 mg total) by mouth daily. 7 tablet 3  . FUROSEMIDE 40 MG PO TABS Oral Take 40 mg by mouth daily. 3 in Am  2 in pm     . HYDROCHLOROTHIAZIDE 25 MG PO TABS Oral Take 25 mg by mouth daily.      Drema Balzarine HCL 200-25 MG PO CAPS Oral Take 3 capsules by mouth at bedtime.      [name] LEVOTHYROXINE SODIUM 100 MCG PO TABS Oral Take 100 mcg by mouth daily.      [name] LIDOCAINE-PRILOCAINE 2.5-2.5 % EX CREA Topical Apply topically as needed. 30 g  0  . LISINOPRIL 20 MG PO TABS Oral Take 20 mg by mouth daily.      [name] LORAZEPAM 0.5 MG PO TABS Oral Take 1 tablet (0.5 mg total) by mouth 2 (two) times daily as needed (anxiety and/or nausea). 30 tablet 0  . MAGNESIUM OXIDE 400 MG PO TABS Oral Take 800 mg by mouth daily.      [name] MELATONIN 10 MG PO TABS Oral Take 3 tablets by mouth at bedtime.      [name] METOCLOPRAMIDE HCL 10 MG PO TABS Oral Take 10 mg by mouth 4 (four) times daily.      [name] NAPROXEN SODIUM 220 MG PO TABS Oral Take 220 mg by mouth 2 (two) times daily as needed. As needed for headaches.    [name] NICOTINE 21 MG/24HR TD PT24 Transdermal Place 1 patch onto the skin daily.      [name] NITROGLYCERIN 0.4 MG SL SUBL Sublingual Place 0.4 mg under the tongue every 5 (five) minutes as needed. As needed for chest pain.    [name] OMEPRAZOLE 20 MG PO CPDR Oral Take 20 mg by mouth 2 (two) times daily.     [name] ONDANSETRON HCL 8 MG PO TABS Oral  Take by mouth every 12 (twelve) hours as needed.      [name] OVER THE COUNTER MEDICATION Oral Take 3 capsules by mouth every morning. Green Tea Extract OTC.     . OXYCODONE-ACETAMINOPHEN 5-325 MG PO TABS Oral Take 1 tablet by mouth 2 (two) times daily as needed. As needed for pain.    [name] POTASSIUM CHLORIDE CRYS ER 20 MEQ PO TBCR Oral Take 20 mEq by mouth daily.     [name] PROCHLORPERAZINE MALEATE 10 MG PO TABS Oral Take 1 tablet (10 mg total) by mouth every 6 (six) hours as needed. 30 tablet 3  . SERTRALINE HCL 100 MG PO TABS Oral Take 100 mg by mouth daily.      . TOBRAMYCIN-DEXAMETHASONE 0.3-0.1 % OP SUSP Both Eyes Place 1 drop into both eyes every 4 (four) hours while awake.      [name] ZOLPIDEM TARTRATE 10 MG PO TABS Oral Take 10 mg by mouth at bedtime.       BP 115/78  Pulse 83  Temp(Src) 98.1 F (36.7 C) (Oral)  Resp 16  SpO2 93%  Physical Exam  Nursing note and vitals reviewed. Constitutional: She appears well-developed and well-nourished.  HENT:  Head: Normocephalic and atraumatic.  Eyes: Conjunctivae are normal. Pupils are equal, round, and reactive to light.  Neck: Neck supple. No tracheal deviation present. No thyromegaly present.  Cardiovascular: Normal rate and regular rhythm.   No murmur heard. Pulmonary/Chest: Effort normal and breath sounds normal.  Abdominal: Soft. Bowel sounds are normal. She exhibits no distension. There is no tenderness.       OBESE  Musculoskeletal: Normal range of motion. She exhibits no edema and no tenderness.  Neurological: She is alert. Coordination normal.  Skin: Skin is warm and dry. No rash noted.  Psychiatric: She has a normal mood and affect.    ED Course  Procedures (including critical care time)  Date: 03/30/2011  Rate: 80  Rhythm: normal sinus rhythm  QRS Axis: normal  Intervals: normal  ST/T Wave abnormalities: nonspecific ST/T changes  Conduction Disutrbances:none  Narrative Interpretation:   Old EKG Reviewed: unchanged No significant  change from 02/26/2011  Labs Reviewed - No data to display No results found.   No diagnosis found. Patient became pain-free without further treatment  in the emergency department.    MDM  D-dimer was weakly positive however CT scan she'll chest not ordered to roll a pulmonary embolism in light of the fact that patient became pain-free without further treatment while in the emergency department.. She also denied shortness of breath which further makes pulmonary embolism highly unlikely. Spoke with Dr. Chales Abrahams, plan patient is to call equal cardiology office tomorrow to arrange to be seen within a week as an outpatient Angina is a remote possibility as cause of chest pain however patient became pain-free spontaneously and symptoms are atypical.  Pt signed out to Dr. Golda Acre 445 pm Diagnoses chest pain     Doug Sou, MD 03/30/11 1700

## 2011-03-30 NOTE — Progress Notes (Unsigned)
1250-[place]otere complete at 1236.  At 1245, prior to hanging of Carboplatin, pt began complaining of "indigestion".  Pt asked her friend to hand her her pocketbook and took a nitroglycerin tablet at 1245.  Pt states that pain is a "10" to entire chest and has not been this severe in the past.  Additional nitro tab taken at 1250 with no relief of pain.  Amy Berry PA notified and to infusion room to assess patient.  Ativan 1mg . IV given at 1300 and pt taken to the ER for further evaluation per order of A. Allyson Sabal PA.  Pt transferred to the ER via w/c. Pt's friend with pt.  Report given to [facility] in the ER.

## 2011-03-30 NOTE — ED Notes (Signed)
Pt was at the [facility] receiving chemotherapy when she had sudden onset left side chest pain 10/10. Received 2 nitroglycerin tablets which reduced chest pain to 6/10. Pt has a history of CHF and has had previous chest pain episodes but not as severe as today.

## 2011-03-30 NOTE — ED Notes (Signed)
Pt was at the [facility] receiving chemotherapy when she had sudden onset of left sided chest pain 10/10. Had nitroglycerin with her and took 2 tablets that brought chest pain down to 6/10. Oncologist saw pt and requested that she go to the ER for further evaluation. Pt has a right port a cath that has already been accessed and has NS infusing.

## 2011-03-30 NOTE — ED Notes (Signed)
Pt taken sandwich and soda.

## 2011-03-30 NOTE — ED Notes (Signed)
MD at bedside. 

## 2011-03-31 ENCOUNTER — Ambulatory Visit (HOSPITAL_BASED_OUTPATIENT_CLINIC_OR_DEPARTMENT_OTHER): Payer: Medicare Other

## 2011-03-31 ENCOUNTER — Ambulatory Visit: Payer: Medicare Other

## 2011-03-31 ENCOUNTER — Other Ambulatory Visit: Payer: Self-pay | Admitting: Oncology

## 2011-03-31 VITALS — BP 113/76 | HR 80 | Temp 98.5°F

## 2011-03-31 DIAGNOSIS — C50919 Malignant neoplasm of unspecified site of unspecified female breast: Secondary | ICD-10-CM | POA: Diagnosis not present

## 2011-03-31 DIAGNOSIS — Z5111 Encounter for antineoplastic chemotherapy: Secondary | ICD-10-CM | POA: Diagnosis not present

## 2011-03-31 MED ORDER — SODIUM CHLORIDE 0.9 % IJ SOLN
10.0000 mL | INTRAMUSCULAR | Status: DC | PRN
  Filled 2011-03-31: qty 10

## 2011-03-31 MED ORDER — ONDANSETRON 16 MG/50ML IVPB (CHCC)
16.0000 mg | Freq: Once | INTRAVENOUS | Status: AC
  Administered 2011-03-31: 16 mg via INTRAVENOUS

## 2011-03-31 MED ORDER — SODIUM CHLORIDE 0.9 % IV SOLN
Freq: Once | INTRAVENOUS | Status: AC
  Administered 2011-03-31: 14:00:00 via INTRAVENOUS

## 2011-03-31 MED ORDER — DEXAMETHASONE SODIUM PHOSPHATE 4 MG/ML IJ SOLN
20.0000 mg | Freq: Once | INTRAMUSCULAR | Status: AC
  Administered 2011-03-31: 20 mg via INTRAVENOUS

## 2011-03-31 MED ORDER — SODIUM CHLORIDE 0.9 % IV SOLN
600.0000 mg | Freq: Once | INTRAVENOUS | Status: AC
  Administered 2011-03-31: 600 mg via INTRAVENOUS
  Filled 2011-03-31: qty 60

## 2011-03-31 MED ORDER — HEPARIN SOD (PORK) LOCK FLUSH 100 UNIT/ML IV SOLN
500.0000 [IU] | Freq: Once | INTRAVENOUS | Status: DC | PRN
  Filled 2011-03-31: qty 5

## 2011-04-01 ENCOUNTER — Ambulatory Visit (HOSPITAL_BASED_OUTPATIENT_CLINIC_OR_DEPARTMENT_OTHER): Payer: Medicare Other

## 2011-04-01 VITALS — BP 124/90 | HR 75 | Temp 98.0°F

## 2011-04-01 DIAGNOSIS — C50919 Malignant neoplasm of unspecified site of unspecified female breast: Secondary | ICD-10-CM

## 2011-04-01 MED ORDER — PEGFILGRASTIM INJECTION 6 MG/0.6ML
6.0000 mg | Freq: Once | SUBCUTANEOUS | Status: AC
  Administered 2011-04-01: 6 mg via SUBCUTANEOUS
  Filled 2011-04-01: qty 0.6

## 2011-04-02 ENCOUNTER — Other Ambulatory Visit: Payer: Self-pay | Admitting: Certified Registered Nurse Anesthetist

## 2011-04-06 ENCOUNTER — Ambulatory Visit (HOSPITAL_BASED_OUTPATIENT_CLINIC_OR_DEPARTMENT_OTHER): Payer: Medicare Other | Admitting: Physician Assistant

## 2011-04-06 ENCOUNTER — Other Ambulatory Visit (HOSPITAL_BASED_OUTPATIENT_CLINIC_OR_DEPARTMENT_OTHER): Payer: Medicare Other | Admitting: Lab

## 2011-04-06 ENCOUNTER — Ambulatory Visit: Payer: Medicare Other

## 2011-04-06 VITALS — BP 100/58 | HR 73 | Temp 98.3°F | Ht <= 58 in | Wt 189.4 lb

## 2011-04-06 DIAGNOSIS — C50919 Malignant neoplasm of unspecified site of unspecified female breast: Secondary | ICD-10-CM

## 2011-04-06 DIAGNOSIS — C50419 Malignant neoplasm of upper-outer quadrant of unspecified female breast: Secondary | ICD-10-CM | POA: Diagnosis not present

## 2011-04-06 DIAGNOSIS — Z170 Estrogen receptor positive status [ER+]: Secondary | ICD-10-CM

## 2011-04-06 DIAGNOSIS — I10 Essential (primary) hypertension: Secondary | ICD-10-CM

## 2011-04-06 DIAGNOSIS — F17200 Nicotine dependence, unspecified, uncomplicated: Secondary | ICD-10-CM | POA: Diagnosis not present

## 2011-04-06 LAB — CBC WITH DIFFERENTIAL/PLATELET
BASO%: 0 % (ref 0.0–2.0)
HCT: 33.4 % — ABNORMAL LOW (ref 34.8–46.6)
LYMPH%: 74.2 % — ABNORMAL HIGH (ref 14.0–49.7)
MCHC: 33.8 g/dL (ref 31.5–36.0)
MCV: 83.9 fL (ref 79.5–101.0)
MONO#: 0.4 10*3/uL (ref 0.1–0.9)
NEUT%: 6.3 % — ABNORMAL LOW (ref 38.4–76.8)
Platelets: 140 10*3/uL — ABNORMAL LOW (ref 145–400)
WBC: 1.9 10*3/uL — ABNORMAL LOW (ref 3.9–10.3)

## 2011-04-06 LAB — COMPREHENSIVE METABOLIC PANEL
AST: 13 U/L (ref 0–37)
BUN: 22 mg/dL (ref 6–23)
CO2: 25 mEq/L (ref 19–32)
Calcium: 9.7 mg/dL (ref 8.4–10.5)
Chloride: 99 mEq/L (ref 96–112)
Creatinine, Ser: 1.29 mg/dL — ABNORMAL HIGH (ref 0.50–1.10)

## 2011-04-06 NOTE — Progress Notes (Signed)
ID: [name]  DOB: 06-Oct-1960  MR#: [id number]  CSN#: [id number]  HPI:  51 year old [place] woman followed by [name], who noted a change in her left breast approximately one month ago.   Dr. Anna Genre set her up for mammography at the [facility] 01/29/2011, and this showed an irregular high-density mass with ill-defined margins in the upper outer quadrant of the left breast. This was palpable and mobile. Ultrasound showed an irregular hypoechoic mass with lobulated margins measuring up to 2.9 cm. One left axillary lymph node was noted to be enlarged at 2.6 cm. There were no other abnormal lymph nodes noted.  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   With this information the patient was set up for bilateral breast MRIs 02/05/2011 showing in the left breast mass in question to measure up to 4.9 cm. There were multiple small nodules without fatty hila in the left axilla, suspicious for metastatic disease there. The right side was unremarkable and there was no suspicious internal mammary adenopathy    On 02/08/2011 the patient underwent biopsy of the largest left axillary lymph node, and this was positive ([id number]). With this information the patient was evaluated at the multidisciplinary breast cancer clinic NOV 2012 for a definitive treatment plan.   Interval History:   [name] had a significant reaction to carboplatin with her second chemotherapy cycle a week ago. Mostly she had chest pain at the time. She went to the emergency room where she said was evaluated. A troponin was 0.00, electrocardiogram showed sinus rhythm with no acute changes, and chest x-ray was negative. She comes today to discuss further how to deal with these issues as well as her  current symptoms.  ROS:  She is still having a sensation of chest pressure. She says she is taking a lot of nitroglycerin. She's not been able or felt like getting out of bed for the last 2 or 3 days. She is having severe diarrhea. There has been no blood or melena that she is aware of. The soles of her feet hurt. Her hands heard and are swollen. She has "a crack" in her bottom that hurts. Basically she feels miserable and wonders if she should be in the hospital. She has not had fevers , unusual headaches, cough, phlegm production, pleurisy, shortness of breath, or problems with her bladder. She is having moderate hot flashes.  Allergies  Allergen Reactions  . Aspirin Hives  . Morphine And Related Hives and Swelling  . Penicillins Hives    Current Outpatient Prescriptions  Medication Sig Dispense Refill  . Alum & Mag Hydroxide-Simeth (MAGIC MOUTHWASH W/LIDOCAINE) SOLN Take 5 mLs by mouth 4 (four) times daily as needed.  240 mL  3  . carvedilol (COREG) 25 MG tablet Take 25 mg by mouth 2 (two) times daily with a meal.        . dexamethasone (DECADRON) 2 MG tablet Take 8 mg by mouth 2 (two) times daily with a meal. Pt takes 4 tabs for 8mg  dose       . dexamethasone (DECADRON) 4 MG tablet Take 8 mg by mouth 2 (two) times daily with a meal. Has not started this medication yet      . furosemide (LASIX) 40 MG tablet Take 40 mg  by mouth daily. 3 in Am  2 in pm       . hydrochlorothiazide (HYDRODIURIL) 25 MG tablet Take 25 mg by mouth daily.        . Ibuprofen-Diphenhydramine HCl (ADVIL PM) 200-25 MG CAPS Take 3 capsules by mouth at bedtime.        [name] levothyroxine (SYNTHROID, LEVOTHROID) 100 MCG tablet Take 100 mcg by mouth daily.        [name] lidocaine-prilocaine (EMLA) cream Apply topically as needed.  30 g  0  . lisinopril (PRINIVIL,ZESTRIL) 20 MG tablet Take 20 mg by mouth daily.        . magnesium oxide (MAG-OX) 400 MG tablet Take 800 mg by mouth daily.        . nicotine (NICODERM CQ - DOSED IN  MG/24 HOURS) 21 mg/24hr patch Place 1 patch onto the skin daily.        . nitroGLYCERIN (NITROSTAT) 0.4 MG SL tablet Place 0.4 mg under the tongue every 5 (five) minutes as needed. As needed for chest pain.      [name] omeprazole (PRILOSEC) 20 MG capsule Take 20 mg by mouth 2 (two) times daily.       . ondansetron (ZOFRAN) 8 MG tablet Take by mouth every 12 (twelve) hours as needed.        [name] OVER THE COUNTER MEDICATION Take 3 capsules by mouth every morning. Green Tea Extract OTC.       [name] oxyCODONE-acetaminophen (PERCOCET) 5-325 MG per tablet Take 1 tablet by mouth 2 (two) times daily as needed. As needed for pain.      . potassium chloride SA (K-DUR,KLOR-CON) 20 MEQ tablet Take 20 mEq by mouth daily.       [name] PRESCRIPTION MEDICATION Pt [facility] at [facility] 03[facility]. Pt had taxotere.. Pt's next treatment is 04-19-11. pts followed by Dr Aaron Edelman.       . prochlorperazine (COMPAZINE) 10 MG tablet Take 1 tablet (10 mg total) by mouth every 6 (six) hours as needed.  30 tablet  3  . sertraline (ZOLOFT) 100 MG tablet Take 100 mg by mouth daily.        [name] tobramycin-dexamethasone (TOBRADEX) ophthalmic solution Place 1 drop into both eyes every 4 (four) hours while awake.        . zolpidem (AMBIEN) 10 MG tablet Take 10 mg by mouth at bedtime.        No current facility-administered medications for this visit.   Facility-Administered Medications Ordered in Other Visits  Medication Dose Route Frequency Provider Last Rate Last Dose  . sodium chloride 0.9 % injection 10 mL  10 mL Intracatheter PRN Lowella Dell, MD       Past Medical History   Diagnosis  Date   .  Depression    .  Hypothyroidism    .  Sleep apnea    .  Congestive heart failure    .  Breast cancer    .  Emphysema of lung    .  GERD (gastroesophageal reflux disease)    .  Heart murmur    .  Hypertension     Past Surgical History   Procedure  Date   .  Abdominal hysterectomy    .  Tah-    .  Slapingooophorectomy, right  remote   .   Portacath placement  02/26/2011     Procedure: INSERTION PORT-A-CATH; Surgeon: Shelly Rubenstein, MD; Location: [facility] OR; Service: General; Laterality: Right;  Family History   Problem  Relation  Age of Onset   .  Leukemia  Mother    the patient's father died at the age of 53 from renal failure; the patient's mother died at the age of 51 from acute leukemia. The patient has 2 brothers and 2 sisters. There is no history of breast or ovarian cancer in the family to her knowledge.   Gynecologic history: Menarche age 75, menopause 2005, the patient never took hormone replacement. She is GX P3, first pregnancy to term age 18.   Social History: The patient is currently unemployed; previously she worked as an Building control surveyor. She is divorced and lives by herself. Her children are Tessica Cupo, lives in [place] and works for the post office, she is 50 years old; [name], 26 a lives in [place] and works for [employer], and [name], 21, who lives in [place] and works in daycare. The patient has 5 grandchildren. She attends the [employer] in [place].    Objective:  Filed Vitals:   04/06/11 1314  BP: 100/58  Pulse: 73  Temp: 98.3 F (36.8 C)    BMI: Body mass index is 39.58 kg/(m^2).   ECOG FS:  Physical Exam:   Sclerae unicteric  Oropharynx shows mild thrush  No peripheral adenopathy  Lungs clear -- no rales or rhonchi  Heart regular rate and rhythm  Abdomen benign, bowel sounds are positive, not increased. There is no tenderness.  MSK no focal spinal tenderness, no peripheral edema  Neuro nonfocal  Breast exam: I do not palpate a mass in either breast there are no significant skin changes or nipple retraction.              Skin: The skin over her hands is peeling slightly. There is bilateral hand edema. This extends to the wrist. The skin of the torso, front and back, is intact. The soles of the feet appear unremarkable to inspection and palpation. The inserter  gluteal fold shows minor irritation, no skin breakdown. There are external hemorrhoids.  Lab Results:      Chemistry      Component Value Date/Time   NA 138 03/30/2011 1503   K 3.8 03/30/2011 1503   CL 103 03/30/2011 1503   CO2 28 03/22/2011 0849   BUN 29* 03/30/2011 1503   CREATININE 1.30* 03/30/2011 1503      Component Value Date/Time   CALCIUM 9.1 03/22/2011 0849   ALKPHOS 75 03/22/2011 0849   AST 28 03/22/2011 0849   ALT 33 03/22/2011 0849   BILITOT 0.2* 03/22/2011 0849       Lab Results  Component Value Date   WBC 1.9* 04/06/2011   HGB 11.3* 04/06/2011   HCT 33.4* 04/06/2011   MCV 83.9 04/06/2011   PLT 140* 04/06/2011   NEUTROABS 0.1* 04/06/2011    Studies/Results:  Dg Chest 1 View  03/30/2011  *RADIOLOGY REPORT*  Clinical Data: Chest pain and shortness of breath; history of smoking; breast cancer  CHEST - 1 VIEW  Comparison: February 26, 2011  Findings: The Powerport tip is stable at the SVC level.  The cardiac silhouette, mediastinum, pulmonary vasculature are within normal limits.  Both lungs are clear.  IMPRESSION: Stable chest x-ray with no evidence of acute cardiac or pulmonary process.  Original Report Authenticated By: Brandon Melnick, M.D.    Assessment: 52 year old [place] woman with what clinically is a T2 N1-2 or likely stage III invasive ductal carcinoma, grade 2, estrogen receptor 99%  positive, but weakly progesterone receptor positive, with an elevated MIB-1 and HER-2 amplification with a CISH ratio of 2.87   1. Being treated neoadjuvantly with docetaxel and carboplatin given every 3 weeks, status post 2 cycles   2. History of congestive heart failure, initiation of trastuzumab pending cardiology approval.   3. Comorbidities include continued tobacco usage, hypertension, and GERD.    Plan: I think many of her current symptoms are due to her reaction to carboplatin, and we will have to discontinue that medication. I am going to switch her to cyclophosphamide with  Taxotere. She is a ready scheduled to have a repeat echo after her third cycle, and then to see me again on February 5. We will keep that schedule.  Today I wrote her for prednisone 20 mg to take daily for 5 days ago with Diflucan 100 mg to take daily for 7 days. I also refill her lorazepam 0.5 mg, 60 tablets to take twice daily as needed. I asked her to call us back in 48 hours and let us know if she is not feeling better. We are also obtaining a stool sample today for C. difficile.  Brayden Brodhead C 04/06/2011

## 2011-04-07 ENCOUNTER — Other Ambulatory Visit: Payer: Self-pay | Admitting: *Deleted

## 2011-04-07 ENCOUNTER — Telehealth: Payer: Self-pay | Admitting: Oncology

## 2011-04-07 DIAGNOSIS — C50919 Malignant neoplasm of unspecified site of unspecified female breast: Secondary | ICD-10-CM

## 2011-04-07 LAB — CLOSTRIDIUM DIFFICILE BY PCR: Toxigenic C. Difficile by PCR: NOT DETECTED

## 2011-04-07 MED ORDER — PREDNISONE 20 MG PO TABS: 20.0000 mg | ORAL_TABLET | Freq: Every day | ORAL | Status: AC

## 2011-04-07 MED ORDER — LORAZEPAM 0.5 MG PO TABS: 0.5000 mg | ORAL_TABLET | Freq: Two times a day (BID) | ORAL | Status: AC | PRN

## 2011-04-07 NOTE — Progress Notes (Signed)
Addended by: Jeanette Caprice C on: 04/07/2011 10:56 AM   Modules accepted: Orders

## 2011-04-07 NOTE — Progress Notes (Signed)
Addended by: Billey Co on: 04/07/2011 05:42 PM   Modules accepted: Orders

## 2011-04-07 NOTE — Telephone Encounter (Signed)
S/w the pt's caregiver and she is aware of her echo appt and the appt to see dr benshimon in the heart/vascular clinic at [facility].

## 2011-04-07 NOTE — Progress Notes (Signed)
I located the email from Dr Katrinka Blazing agreeing w. Proceeding w trastuzumab in this pt, with echos Q3 month and OK referral to Dr Osa Craver having that operationalized.

## 2011-04-07 NOTE — Progress Notes (Signed)
Review of infusion noes show patient's reaction JAN 8 was to docetaxel, not carboplatin; she received carboplatin the next day w/o immediate side-effects, though her skin reactions currently could be to either agent. I am going to switch her to IV CMF starting JAN 29 and proceed tyo 4 additional cycles.  Though the patient has a nl EF currently I do not find that she has been cleared by cardiology--will try to get that in place so she can start receiving trastuzumab.

## 2011-04-09 ENCOUNTER — Ambulatory Visit: Payer: Medicare Other

## 2011-04-13 MED ORDER — FLUCONAZOLE 100 MG PO TABS: 100.0000 mg | ORAL_TABLET | Freq: Every day | ORAL | Status: AC

## 2011-04-14 ENCOUNTER — Ambulatory Visit (HOSPITAL_COMMUNITY)
Admission: RE | Admit: 2011-04-14 | Discharge: 2011-04-14 | Disposition: A | Discharge status: Home / Self Care | Payer: Medicare Other | Source: Ambulatory Visit | Attending: Oncology | Admitting: Oncology

## 2011-04-14 ENCOUNTER — Other Ambulatory Visit (HOSPITAL_COMMUNITY): Payer: Medicare Other | Admitting: Radiology

## 2011-04-14 DIAGNOSIS — Z09 Encounter for follow-up examination after completed treatment for conditions other than malignant neoplasm: Secondary | ICD-10-CM | POA: Insufficient documentation

## 2011-04-14 DIAGNOSIS — Z79899 Other long term (current) drug therapy: Secondary | ICD-10-CM | POA: Diagnosis not present

## 2011-04-14 DIAGNOSIS — C50919 Malignant neoplasm of unspecified site of unspecified female breast: Secondary | ICD-10-CM | POA: Diagnosis not present

## 2011-04-14 NOTE — Progress Notes (Signed)
  Echocardiogram 2D Echocardiogram has been performed.  Jorje Guild [facility] 04/14/2011, 12:26 PM

## 2011-04-20 ENCOUNTER — Ambulatory Visit (HOSPITAL_BASED_OUTPATIENT_CLINIC_OR_DEPARTMENT_OTHER): Payer: Medicare Other

## 2011-04-20 ENCOUNTER — Ambulatory Visit (HOSPITAL_BASED_OUTPATIENT_CLINIC_OR_DEPARTMENT_OTHER): Payer: Medicare Other | Admitting: Physician Assistant

## 2011-04-20 ENCOUNTER — Telehealth: Payer: Self-pay | Admitting: Oncology

## 2011-04-20 ENCOUNTER — Encounter: Payer: Self-pay | Admitting: Physician Assistant

## 2011-04-20 ENCOUNTER — Other Ambulatory Visit (HOSPITAL_BASED_OUTPATIENT_CLINIC_OR_DEPARTMENT_OTHER): Payer: Medicare Other | Admitting: Lab

## 2011-04-20 VITALS — BP 114/75 | HR 101 | Temp 98.3°F | Ht <= 58 in | Wt 192.8 lb

## 2011-04-20 DIAGNOSIS — C50419 Malignant neoplasm of upper-outer quadrant of unspecified female breast: Secondary | ICD-10-CM

## 2011-04-20 DIAGNOSIS — F17200 Nicotine dependence, unspecified, uncomplicated: Secondary | ICD-10-CM | POA: Diagnosis not present

## 2011-04-20 DIAGNOSIS — Z5111 Encounter for antineoplastic chemotherapy: Secondary | ICD-10-CM | POA: Diagnosis not present

## 2011-04-20 DIAGNOSIS — C50919 Malignant neoplasm of unspecified site of unspecified female breast: Secondary | ICD-10-CM | POA: Diagnosis not present

## 2011-04-20 DIAGNOSIS — I509 Heart failure, unspecified: Secondary | ICD-10-CM

## 2011-04-20 LAB — CBC WITH DIFFERENTIAL/PLATELET
Basophils Absolute: 0 10*3/uL (ref 0.0–0.1)
Eosinophils Absolute: 0 10*3/uL (ref 0.0–0.5)
HGB: 9.7 g/dL — ABNORMAL LOW (ref 11.6–15.9)
MONO#: 0.6 10*3/uL (ref 0.1–0.9)
NEUT#: 9.2 10*3/uL — ABNORMAL HIGH (ref 1.5–6.5)
RDW: 14.6 % — ABNORMAL HIGH (ref 11.2–14.5)
lymph#: 1.2 10*3/uL (ref 0.9–3.3)

## 2011-04-20 LAB — COMPREHENSIVE METABOLIC PANEL
Albumin: 3.7 g/dL (ref 3.5–5.2)
BUN: 25 mg/dL — ABNORMAL HIGH (ref 6–23)
Calcium: 10.3 mg/dL (ref 8.4–10.5)
Chloride: 100 mEq/L (ref 96–112)
Glucose, Bld: 152 mg/dL — ABNORMAL HIGH (ref 70–99)
Potassium: 3.4 mEq/L — ABNORMAL LOW (ref 3.5–5.3)

## 2011-04-20 MED ORDER — METHOTREXATE SODIUM CHEMO INJECTION 25 MG/ML
40.0000 mg/m2 | Freq: Once | INTRAMUSCULAR | Status: AC
  Administered 2011-04-20: 75 mg via INTRAVENOUS
  Filled 2011-04-20: qty 3

## 2011-04-20 MED ORDER — TRASTUZUMAB CHEMO INJECTION 440 MG
6.0000 mg/kg | Freq: Once | INTRAVENOUS | Status: AC
  Administered 2011-04-20: 525 mg via INTRAVENOUS
  Filled 2011-04-20: qty 25

## 2011-04-20 MED ORDER — DIPHENHYDRAMINE HCL 25 MG PO CAPS
25.0000 mg | ORAL_CAPSULE | Freq: Once | ORAL | Status: AC
  Administered 2011-04-20: 25 mg via ORAL

## 2011-04-20 MED ORDER — SODIUM CHLORIDE 0.9 % IJ SOLN
10.0000 mL | INTRAMUSCULAR | Status: DC | PRN
  Administered 2011-04-20: 10 mL
  Filled 2011-04-20: qty 10

## 2011-04-20 MED ORDER — HEPARIN SOD (PORK) LOCK FLUSH 100 UNIT/ML IV SOLN
500.0000 [IU] | Freq: Once | INTRAVENOUS | Status: AC | PRN
  Administered 2011-04-20: 500 [IU]
  Filled 2011-04-20: qty 5

## 2011-04-20 MED ORDER — SODIUM CHLORIDE 0.9 % IV SOLN
600.0000 mg/m2 | Freq: Once | INTRAVENOUS | Status: AC
  Administered 2011-04-20: 1120 mg via INTRAVENOUS
  Filled 2011-04-20: qty 56

## 2011-04-20 MED ORDER — ONDANSETRON 8 MG/50ML IVPB (CHCC)
8.0000 mg | Freq: Once | INTRAVENOUS | Status: AC
  Administered 2011-04-20: 8 mg via INTRAVENOUS

## 2011-04-20 MED ORDER — SODIUM CHLORIDE 0.9 % IV SOLN
Freq: Once | INTRAVENOUS | Status: AC
  Administered 2011-04-20: 12:00:00 via INTRAVENOUS

## 2011-04-20 MED ORDER — FLUOROURACIL CHEMO INJECTION 2.5 GM/50ML
600.0000 mg/m2 | Freq: Once | INTRAVENOUS | Status: AC
  Administered 2011-04-20: 1100 mg via INTRAVENOUS
  Filled 2011-04-20: qty 22

## 2011-04-20 MED ORDER — DEXAMETHASONE SODIUM PHOSPHATE 10 MG/ML IJ SOLN
10.0000 mg | Freq: Once | INTRAMUSCULAR | Status: AC
  Administered 2011-04-20: 10 mg via INTRAVENOUS

## 2011-04-20 MED ORDER — ACETAMINOPHEN 325 MG PO TABS
650.0000 mg | ORAL_TABLET | Freq: Once | ORAL | Status: AC
  Administered 2011-04-20: 650 mg via ORAL

## 2011-04-20 NOTE — Telephone Encounter (Signed)
gve the pt's dtr the feb 2013 appt calendar

## 2011-04-20 NOTE — Progress Notes (Signed)
ID: [name]  DOB: Jul 22, 1960  MR#: [id number]  CSN#: [id number]  HPI:  51 year old [place] woman followed by Lonie Peak, who noted a change in her left breast approximately one month ago.   Dr. Anna Genre set her up for mammography at the [facility] 01/29/2011, and this showed an irregular high-density mass with ill-defined margins in the upper outer quadrant of the left breast. This was palpable and mobile. Ultrasound showed an irregular hypoechoic mass with lobulated margins measuring up to 2.9 cm. One left axillary lymph node was noted to be enlarged at 2.6 cm. There were no other abnormal lymph nodes noted.  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   With this information the patient was set up for bilateral breast MRIs 02/05/2011 showing in the left breast mass in question to measure up to 4.9 cm. There were multiple small nodules without fatty hila in the left axilla, suspicious for metastatic disease there. The right side was unremarkable and there was no suspicious internal mammary adenopathy    On 02/08/2011 the patient underwent biopsy of the largest left axillary lymph node, and this was positive ([id number]). With this information the patient was evaluated at the multidisciplinary breast cancer clinic NOV 2012 for a definitive treatment plan.   Interval History:   Alica had a significant reaction to Triniti returns today  For followup of her left breast carcinoma. She completed 2 cycles of docetaxel/carboplatin, but had a significant reaction to the docetaxel with chest pain/pressure during her second cycle 3 weeks ago. She then developed significant skin changes, which could have been associated with either the docetaxel and carboplatin.  When was  evaluated by Dr. Darnelle Catalan following her reaction a couple of weeks ago, and the decision was made to switch her to  IV CMF, with 4 cycles given on a Q. three-week basis beginning today. It was given information regarding these chemotherapy agents when she saw Dr. Darnelle Catalan on January 15, and I also reviewed her entire medical regimen at that time. She's also started on prednisone and Diflucan. She used some cream "from a friend" on her hands (she can't recall the name of the cream) and this seemed to help the skin changes which have now resolved. Her diarrhea has resolved. Her hemorrhoids are improving, although she still has some slight rectal bleeding with bowel movements. She's had no abdominal pain. No fevers, chills, or night sweats. She also denies any significant peripheral neuropathy. She's had no additional chest pain.  Over half of our 50 minute appointment today was spent reviewing Nabeeha's concerns, counseling her regarding her treatment plan, and coordinating care.   .A detailed review of systems is otherwise noncontributory as noted below.  Review of Systems: Constitutional:  no weight loss, fever, night sweats and feels well Eyes: negative QMV:HQIONGEX Cardiovascular: no chest pain or dyspnea on exertion Respiratory: no cough, shortness of breath, or wheezing Neurological: no TIA or stroke symptoms negative Dermatological: negative  Gastrointestinal: no abdominal pain, change in bowel habits, or black or bloody stools, positive for hemorrhoids and mild rectal bleeding Genito-Urinary: no dysuria, trouble voiding, or hematuria Hematological and Lymphatic: negative Breast: negative Musculoskeletal: negative Remaining ROS negative.    Allergies  Allergen Reactions  . Aspirin Hives  . Morphine And Related Hives  and Swelling  . Penicillins Hives  . Taxotere Other (See Comments)    Chest tightness and pain    Current Outpatient Prescriptions  Medication Sig Dispense Refill    . Alum & Mag Hydroxide-Simeth (MAGIC MOUTHWASH W/LIDOCAINE) SOLN Take 5 mLs by mouth 4 (four) times daily as needed.  240 mL  3  . carvedilol (COREG) 25 MG tablet Take 25 mg by mouth 2 (two) times daily with a meal.        . dexamethasone (DECADRON) 2 MG tablet Take 8 mg by mouth 2 (two) times daily with a meal. Pt takes 4 tabs for 8mg  dose       . dexamethasone (DECADRON) 4 MG tablet Take 8 mg by mouth 2 (two) times daily with a meal. Has not started this medication yet      . furosemide (LASIX) 40 MG tablet Take 40 mg by mouth daily. 3 in Am  2 in pm       . hydrochlorothiazide (HYDRODIURIL) 25 MG tablet Take 25 mg by mouth daily.        . Ibuprofen-Diphenhydramine HCl (ADVIL PM) 200-25 MG CAPS Take 3 capsules by mouth at bedtime.        [name] levothyroxine (SYNTHROID, LEVOTHROID) 100 MCG tablet Take 100 mcg by mouth daily.        [name] lidocaine-prilocaine (EMLA) cream Apply topically as needed.  30 g  0  . lisinopril (PRINIVIL,ZESTRIL) 20 MG tablet Take 20 mg by mouth daily.        . magnesium oxide (MAG-OX) 400 MG tablet Take 800 mg by mouth daily.        . nicotine (NICODERM CQ - DOSED IN MG/24 HOURS) 21 mg/24hr patch Place 1 patch onto the skin daily.        . nitroGLYCERIN (NITROSTAT) 0.4 MG SL tablet Place 0.4 mg under the tongue every 5 (five) minutes as needed. As needed for chest pain.      [name] omeprazole (PRILOSEC) 20 MG capsule Take 20 mg by mouth 2 (two) times daily.       . ondansetron (ZOFRAN) 8 MG tablet Take by mouth every 12 (twelve) hours as needed.        [name] OVER THE COUNTER MEDICATION Take 3 capsules by mouth every morning. Green Tea Extract OTC.       [name] oxyCODONE-acetaminophen (PERCOCET) 5-325 MG per tablet Take 1 tablet by mouth 2 (two) times daily as needed. As needed for pain.      . potassium chloride SA (K-DUR,KLOR-CON) 20 MEQ tablet Take 20 mEq by mouth daily.       [name] PRESCRIPTION MEDICATION Pt [facility]. Pt had taxotere.. Pt's next treatment is 04-19-11. pts  followed by Dr Aaron Edelman.       . prochlorperazine (COMPAZINE) 10 MG tablet Take 1 tablet (10 mg total) by mouth every 6 (six) hours as needed.  30 tablet  3  . sertraline (ZOLOFT) 100 MG tablet Take 100 mg by mouth daily.        [name] tobramycin-dexamethasone (TOBRADEX) ophthalmic solution Place 1 drop into both eyes every 4 (four) hours while awake.        . zolpidem (AMBIEN) 10 MG tablet Take 10 mg by mouth at bedtime.        No current facility-administered medications for this visit.   Facility-Administered Medications Ordered in Other Visits  Medication Dose Route Frequency Provider Last Rate Last Dose  . 0.9 %  sodium  chloride infusion   Intravenous Once Lowella Dell, MD      . acetaminophen (TYLENOL) tablet 650 mg  650 mg Oral Once Lowella Dell, MD   650 mg at 04/20/11 1217  . cyclophosphamide (CYTOXAN) 1,120 mg in sodium chloride 0.9 % 250 mL chemo infusion  600 mg/m2 (Treatment Plan Actual) Intravenous Once Lowella Dell, MD   1,120 mg at 04/20/11 1424  . dexamethasone (DECADRON) injection 10 mg  10 mg Intravenous Once Lowella Dell, MD   10 mg at 04/20/11 1216  . diphenhydrAMINE (BENADRYL) capsule 25 mg  25 mg Oral Once Lowella Dell, MD   25 mg at 04/20/11 1218  . fluorouracil (ADRUCIL) chemo injection 1,100 mg  600 mg/m2 (Treatment Plan Actual) Intravenous Once Lowella Dell, MD   1,100 mg at 04/20/11 1534  . heparin lock flush 100 unit/mL  500 Units Intracatheter Once PRN Lowella Dell, MD   500 Units at 04/20/11 1540  . methotrexate chemo injection 75 mg  40 mg/m2 (Treatment Plan Actual) Intravenous Once Lowella Dell, MD   75 mg at 04/20/11 1531  . ondansetron (ZOFRAN) IVPB 8 mg  8 mg Intravenous Once Lowella Dell, MD   8 mg at 04/20/11 1216  . sodium chloride 0.9 % injection 10 mL  10 mL Intracatheter PRN Lowella Dell, MD      . trastuzumab (HERCEPTIN) 525 mg in sodium chloride 0.9 % 250 mL chemo infusion  6 mg/kg (Treatment Plan Actual)  Intravenous Once Lowella Dell, MD   525 mg at 04/20/11 1248  . DISCONTD: sodium chloride 0.9 % injection 10 mL  10 mL Intracatheter PRN Lowella Dell, MD   10 mL at 04/20/11 1539   Past Medical History   Diagnosis  Date   .  Depression    .  Hypothyroidism    .  Sleep apnea    .  Congestive heart failure    .  Breast cancer    .  Emphysema of lung    .  GERD (gastroesophageal reflux disease)    .  Heart murmur    .  Hypertension     Past Surgical History   Procedure  Date   .  Abdominal hysterectomy    .  Tah-    .  Slapingooophorectomy, right  remote   .  Portacath placement  02/26/2011     Procedure: INSERTION PORT-A-CATH; Surgeon: Shelly Rubenstein, MD; Location: [facility]; Service: General; Laterality: Right;     Family History   Problem  Relation  Age of Onset   .  Leukemia  Mother    the patient's father died at the age of 68 from renal failure; the patient's mother died at the age of 88 from acute leukemia. The patient has 2 brothers and 2 sisters. There is no history of breast or ovarian cancer in the family to her knowledge.   Gynecologic history: Menarche age 37, menopause 2005, the patient never took hormone replacement. She is GX P3, first pregnancy to term age 48.   Social History: The patient is currently unemployed; previously she worked as an Building control surveyor. She is divorced and lives by herself. Her children are Yaniris Braddock, lives in [place] and works for the post office, she is 50 years old; [name], 26 a lives in [place] and works for [employer], and [name], 21, who lives in [place] and works in daycare. The patient has 5 grandchildren.  She attends the [employer] in [place].   Physical Exam: Filed Vitals:   04/20/11 1105  BP: 114/75  Pulse: 101  Temp: 98.3 F (36.8 C)   HEENT:  Sclerae anicteric.  Oropharynx clear.  No mucositis or candidiasis.   Nodes:  No cervical, supraclavicular, or axillary lymphadenopathy palpated.    Breast Exam:  Deferred Lungs:  Clear to auscultation bilaterally.  No crackles, rhonchi, or wheezes.   Heart:  Regular rate and rhythm.   Abdomen:  Soft, nontender.  Positive bowel sounds.  No organomegaly or masses palpated.   Musculoskeletal:  No focal spinal tenderness to palpation.  Extremities:  Benign.  No peripheral edema or cyanosis.   Skin:  Dry, hyperpigmented, otherwise benign.   Neuro:  Nonfocal.   Lab Results:      Chemistry      Component Value Date/Time   NA 138 04/20/2011 1052   K 3.4* 04/20/2011 1052   CL 100 04/20/2011 1052   CO2 25 04/20/2011 1052   BUN 25* 04/20/2011 1052   CREATININE 1.27* 04/20/2011 1052      Component Value Date/Time   CALCIUM 10.3 04/20/2011 1052   ALKPHOS 80 04/20/2011 1052   AST 19 04/20/2011 1052   ALT 27 04/20/2011 1052   BILITOT 0.1* 04/20/2011 1052       Lab Results  Component Value Date   WBC 11.1* 04/20/2011   HGB 9.7* 04/20/2011   HCT 28.2* 04/20/2011   MCV 86.1 04/20/2011   PLT 102* 04/20/2011   NEUTROABS 9.2* 04/20/2011    Studies/Results:  Dg Chest 1 View  03/30/2011  *RADIOLOGY REPORT*  Clinical Data: Chest pain and shortness of breath; history of smoking; breast cancer  CHEST - 1 VIEW  Comparison: February 26, 2011  Findings: The Powerport tip is stable at the SVC level.  The cardiac silhouette, mediastinum, pulmonary vasculature are within normal limits.  Both lungs are clear.  IMPRESSION: Stable chest x-ray with no evidence of acute cardiac or pulmonary process.  Original Report Authenticated By: Brandon Melnick, M.D.    Assessment: 51 year old [place] woman with what clinically is a T2 N1-2 or likely stage III invasive ductal carcinoma, grade 2, estrogen receptor 99% positive, but weakly progesterone receptor positive, with an elevated MIB-1 and HER-2 amplification with a CISH ratio of 2.87   1. Being treated neoadjuvantly with docetaxel and carboplatin given every 3 weeks, status post 2 cycles, with subsequent reaction to  docetaxel. Now being treated with IV CMF, the goal being to treat with 4  q. three-week doses, given along with trastuzumab.   2. History of congestive heart failure, initiation of trastuzumab approved status post cardiology evaluation, with an estimated ejection fraction on 04/14/2011 5055%.  3. Comorbidities include continued tobacco usage, hypertension, and GERD.    Plan: This case has been reviewed with Dr. Darnelle Catalan, and he is ready to proceed with Luberta's first cycle of IVC M. half, given along with trastuzumab today. She'll return next week for assessment of chemotoxicity on February 4. She tentatively had an appointment for reevaluation with Dr. Gala Romney that same day, but has requested that we cancel the appointment.  Jaylanni has also requested a second opinion from another physician. I've given this information to our her breast navigator, and she will coordinate a new patient appointment for [facility] with Dr. Donnie Coffin per her request.   This plan was reviewed in detail with the patient today who voices her understanding and agreement. She knows to call any  changes or problems prior to her next scheduled appointment on February 4.  Jadine Brumley 04/20/2011

## 2011-04-21 ENCOUNTER — Other Ambulatory Visit: Payer: Self-pay | Admitting: Oncology

## 2011-04-21 ENCOUNTER — Ambulatory Visit: Payer: Medicare Other

## 2011-04-22 ENCOUNTER — Encounter: Payer: Self-pay | Admitting: *Deleted

## 2011-04-22 ENCOUNTER — Telehealth: Payer: Self-pay | Admitting: *Deleted

## 2011-04-22 ENCOUNTER — Other Ambulatory Visit: Payer: Self-pay | Admitting: *Deleted

## 2011-04-22 ENCOUNTER — Other Ambulatory Visit: Payer: Self-pay | Admitting: Physician Assistant

## 2011-04-22 DIAGNOSIS — C50919 Malignant neoplasm of unspecified site of unspecified female breast: Secondary | ICD-10-CM

## 2011-04-22 MED ORDER — MUPIROCIN 2 % EX OINT: TOPICAL_OINTMENT | Freq: Three times a day (TID) | CUTANEOUS | Status: AC

## 2011-04-22 NOTE — Telephone Encounter (Signed)
per dawn cancelled appointments with amy berry and dr.[email or web address]

## 2011-04-22 NOTE — Progress Notes (Signed)
Pt request to switch med onc MD from Dr. Darnelle Catalan to Dr. Donnie Coffin.  Pt to see Dr. Donnie Coffin and Debbora Presto, PA on 2/5 at 3:12 with labs at 2:45.  Pt confirmed appt date and time.

## 2011-04-23 ENCOUNTER — Inpatient Hospital Stay: Admission: RE | Admit: 2011-04-23 | Payer: Medicare Other | Source: Ambulatory Visit

## 2011-04-23 DIAGNOSIS — I428 Other cardiomyopathies: Secondary | ICD-10-CM | POA: Diagnosis not present

## 2011-04-23 DIAGNOSIS — I5022 Chronic systolic (congestive) heart failure: Secondary | ICD-10-CM | POA: Diagnosis not present

## 2011-04-23 DIAGNOSIS — I10 Essential (primary) hypertension: Secondary | ICD-10-CM | POA: Diagnosis not present

## 2011-04-23 DIAGNOSIS — R079 Chest pain, unspecified: Secondary | ICD-10-CM | POA: Diagnosis not present

## 2011-04-26 ENCOUNTER — Ambulatory Visit: Payer: Medicare Other | Admitting: Physician Assistant

## 2011-04-26 ENCOUNTER — Encounter (HOSPITAL_COMMUNITY): Payer: Medicare Other

## 2011-04-26 ENCOUNTER — Other Ambulatory Visit: Payer: Medicare Other | Admitting: Lab

## 2011-04-27 ENCOUNTER — Ambulatory Visit (HOSPITAL_BASED_OUTPATIENT_CLINIC_OR_DEPARTMENT_OTHER): Payer: Medicare Other | Admitting: Physician Assistant

## 2011-04-27 ENCOUNTER — Ambulatory Visit: Payer: Medicare Other | Admitting: Oncology

## 2011-04-27 ENCOUNTER — Other Ambulatory Visit: Payer: Medicare Other | Admitting: Lab

## 2011-04-27 ENCOUNTER — Ambulatory Visit: Payer: Medicare Other | Admitting: Physician Assistant

## 2011-04-27 ENCOUNTER — Other Ambulatory Visit (HOSPITAL_BASED_OUTPATIENT_CLINIC_OR_DEPARTMENT_OTHER): Payer: Medicare Other | Admitting: Lab

## 2011-04-27 ENCOUNTER — Telehealth: Payer: Self-pay | Admitting: Oncology

## 2011-04-27 DIAGNOSIS — I509 Heart failure, unspecified: Secondary | ICD-10-CM

## 2011-04-27 DIAGNOSIS — C50919 Malignant neoplasm of unspecified site of unspecified female breast: Secondary | ICD-10-CM

## 2011-04-27 DIAGNOSIS — D649 Anemia, unspecified: Secondary | ICD-10-CM | POA: Diagnosis not present

## 2011-04-27 DIAGNOSIS — R5383 Other fatigue: Secondary | ICD-10-CM

## 2011-04-27 DIAGNOSIS — R5381 Other malaise: Secondary | ICD-10-CM | POA: Diagnosis not present

## 2011-04-27 LAB — CBC WITH DIFFERENTIAL/PLATELET
Basophils Absolute: 0 10*3/uL (ref 0.0–0.1)
Eosinophils Absolute: 0 10*3/uL (ref 0.0–0.5)
HGB: 8.8 g/dL — ABNORMAL LOW (ref 11.6–15.9)
MONO#: 0.1 10*3/uL (ref 0.1–0.9)
NEUT#: 1.7 10*3/uL (ref 1.5–6.5)
RDW: 15.4 % — ABNORMAL HIGH (ref 11.2–14.5)
WBC: 3.7 10*3/uL — ABNORMAL LOW (ref 3.9–10.3)
lymph#: 1.9 10*3/uL (ref 0.9–3.3)

## 2011-04-27 LAB — COMPREHENSIVE METABOLIC PANEL
Albumin: 3.5 g/dL (ref 3.5–5.2)
BUN: 14 mg/dL (ref 6–23)
Calcium: 9.3 mg/dL (ref 8.4–10.5)
Chloride: 101 mEq/L (ref 96–112)
Glucose, Bld: 89 mg/dL (ref 70–99)
Potassium: 4.1 mEq/L (ref 3.5–5.3)
Sodium: 137 mEq/L (ref 135–145)
Total Protein: 6.7 g/dL (ref 6.0–8.3)

## 2011-04-27 NOTE — Telephone Encounter (Signed)
gve the pt her feb-April 2013 appt calendar along with the three echo appts

## 2011-04-27 NOTE — Progress Notes (Signed)
Hematology and Oncology Follow Up Visit  [name] [id number] 10-28-1960 51 y.o. 04/27/2011    HPI:    [name] is a 51 year old [name] woman with a locally advanced ER/PR positive at 99/2% respectively, HER-2 positive, cish ratio of 2.87, left breast carcinoma with biopsy-proven lymph node involvement. Initial staging breast MRI on 02/05/2011 showing a mass measuring 4.9 cm with multiple small nodules without fatty hila in the left axilla suspicious for metastatic disease. She completed 2 cycles of every 3 week neoadjuvant Taxotere/carboplatin, due to significant difficulty with the Taxotere component based i.e. significant PPE changes, and chest pain with her second infusion, her treatment course was switched to every 3 week IV CMF/Herceptin. Currently day 8 cycle 1 of every 3 week IV CMF/Herceptin. 2. History of congestive heart failure, echocardiogram from 01/23 2013 revealing a left ventricular ejection fraction between 50 and 55%. Also on Lasix 120 mg in the a.m., 80 mg in the p.m. HCTZ 25 mg by mouth daily, lisinopril 20 mg by mouth daily, Coreg 25 mg by mouth twice a day 3. Hypothyroidism-on Synthroid 100 mcg daily 4. COPD 5. GERD on Prilosec 20 mg by mouth twice a day 6. Depression on Zoloft 100 mg by mouth daily 7. Reported history of myocardial infarction 7 years ago.  Interim History:   [name] is seen today to establish with [name] for ongoing care and treatment of her locally advanced ER/PR positive HER-2 positive left breast carcinoma with known lymph node involvement. She presents today with her dear friend [name] in accompaniment for followup after her first cycle of neoadjuvant Q3 week IV CMF given in combination with every 3 week Herceptin after cardiology clearance in regards to her history of CHF. She did much better this go around as compared to the Taxotere/carboplatin-based regimen. She denies any difficulty with nausea or emesis. No diarrhea or  constipation issues. She does have excessive eye tearing but this is residual from the Taxotere she has been utilizing the TobraDex eye drops. She denies any fevers chills or night sweats. She does have easy fatigability, noting that her knees "feel like water". She has shortness of breath with exertion but denies any chest pain. She denies any diffuse bone pain. No bleeding or bruising symptoms. Her prior PPE symptoms have completely abated, that she does have residual hyperpigmentation over the dorsum of her hands bilaterally. She notes her feet are peeling", but they are not sore sensitive. She is unable to feel any palpable breast mass in the left breast. She denies any breast pain.  A detailed review of systems is otherwise noncontributory as noted below.  Review of Systems: Constitutional:  fatigued and generally weak Eyes: excessive tearing ENT: No complaints Cardiovascular: positive for - shortness of breath Respiratory: positive for - shortness of breath Neurological: no TIA or stroke symptoms Dermatological: negative Gastrointestinal: no abdominal pain, change in bowel habits, or black or bloody stools Genito-Urinary: no dysuria, trouble voiding, or hematuria Hematological and Lymphatic: positive for - pallor Breast: negative for breast lumps Patient unable to palpate L breast mass. Musculoskeletal: negative Remaining ROS negative.  Family History: .  Leukemia  Mother    The patient's father died at the age of 2 from renal failure; The patient's mother died at the age of 72 from acute leukemia. The patient has 2 brothers and 2 sisters. There is no history of breast or ovarian cancer in the family to her knowledge.    Gynecologic History: Menarche age  14, menopause 2005, the patient never took hormone replacement. She is GX P3, first pregnancy to term age 39.    Social History: The patient is currently unemployed; previously she worked as an Building control surveyor. She is divorced and  lives by herself. Her children are [name], lives in [place] and works for the post office, she is 50 years old; [name], 26 a lives in [place] and works for [employer], and [name], 21, who lives in [place] and works in daycare. The patient has 5 grandchildren. She attends the [employer] in [place].    Medications:   I have reviewed the patient's current medications.  Current Outpatient Prescriptions  Medication Sig Dispense Refill  . carvedilol (COREG) 25 MG tablet Take 25 mg by mouth 2 (two) times daily with a meal.        . furosemide (LASIX) 40 MG tablet Take 40 mg by mouth daily. 3 in Am  2 in pm       . hydrochlorothiazide (HYDRODIURIL) 25 MG tablet Take 25 mg by mouth daily.        [name] levothyroxine (SYNTHROID, LEVOTHROID) 100 MCG tablet Take 100 mcg by mouth daily.        [name] lisinopril (PRINIVIL,ZESTRIL) 20 MG tablet Take 20 mg by mouth daily.        . magnesium oxide (MAG-OX) 400 MG tablet Take 800 mg by mouth daily.        . nitroGLYCERIN (NITROSTAT) 0.4 MG SL tablet Place 0.4 mg under the tongue every 5 (five) minutes as needed. As needed for chest pain.      [name] omeprazole (PRILOSEC) 20 MG capsule Take 20 mg by mouth 2 (two) times daily.       . ondansetron (ZOFRAN) 8 MG tablet Take by mouth every 12 (twelve) hours as needed.        [name] OVER THE COUNTER MEDICATION Take 3 capsules by mouth every morning. Green Tea Extract OTC.       [name] oxyCODONE-acetaminophen (PERCOCET) 5-325 MG per tablet Take 1 tablet by mouth 2 (two) times daily as needed. As needed for pain.      . potassium chloride SA (K-DUR,KLOR-CON) 20 MEQ tablet Take 20 mEq by mouth daily.       . sertraline (ZOLOFT) 100 MG tablet Take 100 mg by mouth daily.        [name] tobramycin-dexamethasone (TOBRADEX) ophthalmic solution Place 1 drop into both eyes every 4 (four) hours while awake.        [name] Alum & Mag Hydroxide-Simeth (MAGIC MOUTHWASH W/LIDOCAINE) SOLN Take 5 mLs by mouth 4 (four) times daily  as needed.  240 mL  3  . dexamethasone (DECADRON) 2 MG tablet Take 8 mg by mouth 2 (two) times daily with a meal. Pt takes 4 tabs for 8mg  dose       . dexamethasone (DECADRON) 4 MG tablet Take 8 mg by mouth 2 (two) times daily with a meal. Has not started this medication yet      . Ibuprofen-Diphenhydramine HCl (ADVIL PM) 200-25 MG CAPS Take 3 capsules by mouth at bedtime.        . lidocaine-prilocaine (EMLA) cream Apply topically as needed.  30 g  0  . mupirocin ointment (BACTROBAN) 2 % Apply topically 3 (three) times daily.  22 g  1  . nicotine (NICODERM CQ - DOSED IN MG/24 HOURS) 21 mg/24hr patch Place 1 patch onto the skin daily.        [name]  prochlorperazine (COMPAZINE) 10 MG tablet Take 1 tablet (10 mg total) by mouth every 6 (six) hours as needed.  30 tablet  3  . zolpidem (AMBIEN) 10 MG tablet Take 10 mg by mouth at bedtime.        No current facility-administered medications for this visit.   Facility-Administered Medications Ordered in Other Visits  Medication Dose Route Frequency Provider Last Rate Last Dose  . sodium chloride 0.9 % injection 10 mL  10 mL Intracatheter PRN Lowella Dell, MD        Allergies:  Allergies  Allergen Reactions  . Aspirin Hives  . Morphine And Related Hives and Swelling  . Penicillins Hives  . Taxotere Other (See Comments)    Chest tightness and pain    Physical Exam: Filed Vitals:   04/27/11 1506  BP: 136/84  Pulse: 96  Temp: 98.7 F (37.1 C)   HEENT:  Sclerae anicteric, conjunctivae pink.  Oropharynx clear.  No frank evidence to suggest oral mucositis or candidiasis.   Nodes:  No cervical, supraclavicular, or axillary lymphadenopathy palpated.  Breast Exam: The left breast was examined, there is no evidence of any obvious definable mass within the upper outer quadrant, there is no nipple inversion or discharge and the axilla is also clear of any palpable lymphadenopathy. Right breast was examined by Dr. Donnie Coffin, no evidence of dominant mass  effect nipple inversion or discharge axilla clear. Lungs:  Clear to auscultation bilaterally.  No crackles, rhonchi, or wheezes.   Heart:  Regular rate and rhythm, mild right-sided JVD. Abdomen:  Soft, nontender.  Positive bowel sounds.  No organomegaly or masses palpated.   Musculoskeletal:  No focal spinal tenderness to palpation.  Extremities:  Benign.  No peripheral edema or cyanosis.   Skin:  Benign.   Neuro:  Nonfocal, alert and oriented x 3.   Lab Results: Lab Results  Component Value Date   WBC 3.7* 04/27/2011   HGB 8.8* 04/27/2011   HCT 25.5* 04/27/2011   MCV 86.0 04/27/2011   PLT 190 04/27/2011   NEUTROABS 1.7 04/27/2011     Chemistry      Component Value Date/Time   NA 137 04/27/2011 1444   K 4.1 04/27/2011 1444   CL 101 04/27/2011 1444   CO2 29 04/27/2011 1444   BUN 14 04/27/2011 1444   CREATININE 1.03 04/27/2011 1444      Component Value Date/Time   CALCIUM 9.3 04/27/2011 1444   ALKPHOS 74 04/27/2011 1444   AST 13 04/27/2011 1444   ALT 16 04/27/2011 1444   BILITOT 0.1* 04/27/2011 1444     Echocardiogram: 04/14/11 Study Conclusions Left ventricle: The cavity size was normal. Systolic function was normal. The estimated ejection fraction was in the range of 50% to 55%. Wall motion was normal; there were no regional wall motion abnormalities.  Impressions:  - EF at the lower end of normal range. No significant change from 12/12 study. Transthoracic echocardiography. M-mode, complete 2D, spectral Doppler, and color Doppler. Height: Height: 154.9cm. Height: 61in. Weight: Weight: 83.5kg. Weight: 183.6lb. Body mass index: BMI: 34.8kg/m^2. Body surface area: BSA: 1.75m^2. Patient status: Outpatient. Location: Echo laboratory.      Assessment:   Oksana is a 51 year old Kimberly [name] woman with a locally advanced ER/PR positive at 99/2% respectively, HER-2 positive with the cish ratio of 2.87, left breast carcinoma with biopsy-proven lymph node involvement. Initial staging  breast MRI on 02/05/2011 showing a mass measuring 4.9 cm with multiple small nodules without fatty  hila in the left axilla suspicious for metastatic disease. She completed 2 cycles of every 3 week neoadjuvant Taxotere/carboplatin, due to significant difficulty with the Taxotere component based i.e. significant PPE changes, and chest pain with her second infusion, her treatment course was switched to every 3 week IV CMF/Herceptin. Currently day 8 cycle 1 of every 3 week IV CMF/Herceptin. 2. History of congestive heart failure, echocardiogram from 01/23 2013 revealing a left ventricular ejection fraction between 50 and 55%. Also on Lasix 120 mg in the a.m., 80 mg in the p.m. HCTZ 25 mg by mouth daily, lisinopril 20 mg by mouth daily, Coreg 25 mg by mouth twice a day 3. Hypothyroidism-on Synthroid 100 mcg daily 4. COPD 5. GERD on Prilosec 20 mg by mouth twice a day 6. Depression on Zoloft 100 mg by mouth daily   Case has been reviewed with [name] who also examined the patient.  Plan:  Overall, it does appear that [name] tolerated her IV CMF given in combination with Herceptin well. She is noted to be anemic with a hemoglobin of 8.8 g, she is fatigued. We will plan on rechecking a CBC in 1 weeks time, I would be reluctant to proceed with blood transfusion given her history of fairly severe CHF. If the transfusion as necessary, further diuresis would be indicated. We will plan on repeating 2-D echocardiogram with Dr. Mayford Knife to read  before each dose of every 3 week CMF/Herceptin. Therefore, she'll be scheduled for an echo next week along with a repeat CBC. After the next cycle of chemotherapy, we will plan on MRI of the breast to assess response since we cannot feel any obvious mass effect at this time. Also, given her young age, we will refer her to genetics for BRCA testing.  Camiah understands not to hesitate contacting our office or seeking medical attention if necessary prior to her next followup  officially in 2 weeks time.   This plan was reviewed with the patient, who voices understanding and agreement.  She knows to call with any changes or problems.    Savaughn Karwowski T, PA-C 04/27/2011

## 2011-04-27 NOTE — Telephone Encounter (Signed)
Pt is aware she will be notified regarding the genetics testing appt.

## 2011-04-28 ENCOUNTER — Other Ambulatory Visit: Payer: Self-pay | Admitting: Physician Assistant

## 2011-04-28 DIAGNOSIS — C50919 Malignant neoplasm of unspecified site of unspecified female breast: Secondary | ICD-10-CM

## 2011-04-28 DIAGNOSIS — I509 Heart failure, unspecified: Secondary | ICD-10-CM

## 2011-04-29 ENCOUNTER — Telehealth: Payer: Self-pay | Admitting: *Deleted

## 2011-04-29 NOTE — Telephone Encounter (Signed)
Confirmed 05/10/11 genetics appt w/ pt. 

## 2011-05-04 ENCOUNTER — Other Ambulatory Visit: Payer: Medicare Other | Admitting: Lab

## 2011-05-05 ENCOUNTER — Other Ambulatory Visit: Payer: Self-pay | Admitting: *Deleted

## 2011-05-05 ENCOUNTER — Ambulatory Visit (HOSPITAL_BASED_OUTPATIENT_CLINIC_OR_DEPARTMENT_OTHER): Payer: Medicare Other | Admitting: Lab

## 2011-05-05 ENCOUNTER — Ambulatory Visit (HOSPITAL_COMMUNITY)
Admission: RE | Admit: 2011-05-05 | Discharge: 2011-05-05 | Disposition: A | Discharge status: Home / Self Care | Payer: Medicare Other | Source: Ambulatory Visit | Attending: Oncology | Admitting: Oncology

## 2011-05-05 DIAGNOSIS — I059 Rheumatic mitral valve disease, unspecified: Secondary | ICD-10-CM | POA: Insufficient documentation

## 2011-05-05 DIAGNOSIS — I10 Essential (primary) hypertension: Secondary | ICD-10-CM | POA: Insufficient documentation

## 2011-05-05 DIAGNOSIS — I519 Heart disease, unspecified: Secondary | ICD-10-CM | POA: Insufficient documentation

## 2011-05-05 DIAGNOSIS — D649 Anemia, unspecified: Secondary | ICD-10-CM

## 2011-05-05 DIAGNOSIS — Z79899 Other long term (current) drug therapy: Secondary | ICD-10-CM | POA: Diagnosis not present

## 2011-05-05 DIAGNOSIS — C50919 Malignant neoplasm of unspecified site of unspecified female breast: Secondary | ICD-10-CM | POA: Diagnosis not present

## 2011-05-05 DIAGNOSIS — C801 Malignant (primary) neoplasm, unspecified: Secondary | ICD-10-CM

## 2011-05-05 LAB — CBC WITH DIFFERENTIAL/PLATELET
Basophils Absolute: 0 10*3/uL (ref 0.0–0.1)
EOS%: 0 % (ref 0.0–7.0)
Eosinophils Absolute: 0 10*3/uL (ref 0.0–0.5)
HGB: 9.5 g/dL — ABNORMAL LOW (ref 11.6–15.9)
MCH: 29.2 pg (ref 25.1–34.0)
NEUT#: 0.1 10*3/uL — CL (ref 1.5–6.5)
RBC: 3.25 10*6/uL — ABNORMAL LOW (ref 3.70–5.45)
RDW: 17.1 % — ABNORMAL HIGH (ref 11.2–14.5)
lymph#: 1.3 10*3/uL (ref 0.9–3.3)

## 2011-05-05 NOTE — Progress Notes (Signed)
*  PRELIMINARY RESULTS* Echocardiogram 2D Echocardiogram has been performed.  [name] 05/05/2011, 2:53 PM

## 2011-05-10 ENCOUNTER — Ambulatory Visit: Payer: Medicare Other | Admitting: Physician Assistant

## 2011-05-10 ENCOUNTER — Other Ambulatory Visit: Payer: Self-pay | Admitting: Oncology

## 2011-05-11 ENCOUNTER — Ambulatory Visit: Payer: Medicare Other | Admitting: Physician Assistant

## 2011-05-11 ENCOUNTER — Other Ambulatory Visit: Payer: Medicare Other | Admitting: Lab

## 2011-05-11 ENCOUNTER — Ambulatory Visit: Payer: Medicare Other

## 2011-05-12 ENCOUNTER — Ambulatory Visit (HOSPITAL_BASED_OUTPATIENT_CLINIC_OR_DEPARTMENT_OTHER): Payer: Medicare Other | Admitting: Physician Assistant

## 2011-05-12 ENCOUNTER — Ambulatory Visit: Payer: Medicare Other

## 2011-05-12 ENCOUNTER — Other Ambulatory Visit: Payer: Medicare Other

## 2011-05-12 ENCOUNTER — Ambulatory Visit (HOSPITAL_BASED_OUTPATIENT_CLINIC_OR_DEPARTMENT_OTHER): Payer: Medicare Other

## 2011-05-12 VITALS — BP 125/86 | HR 84 | Temp 99.0°F | Ht <= 58 in | Wt 197.0 lb

## 2011-05-12 DIAGNOSIS — C50919 Malignant neoplasm of unspecified site of unspecified female breast: Secondary | ICD-10-CM

## 2011-05-12 DIAGNOSIS — Z5111 Encounter for antineoplastic chemotherapy: Secondary | ICD-10-CM

## 2011-05-12 LAB — CBC WITH DIFFERENTIAL/PLATELET
Eosinophils Absolute: 0 10*3/uL (ref 0.0–0.5)
HCT: 28.5 % — ABNORMAL LOW (ref 34.8–46.6)
LYMPH%: 28.2 % (ref 14.0–49.7)
MCHC: 33 g/dL (ref 31.5–36.0)
MCV: 88 fL (ref 79.5–101.0)
MONO#: 0.7 10*3/uL (ref 0.1–0.9)
MONO%: 11.3 % (ref 0.0–14.0)
NEUT%: 60.3 % (ref 38.4–76.8)
Platelets: 227 10*3/uL (ref 145–400)
RBC: 3.24 10*6/uL — ABNORMAL LOW (ref 3.70–5.45)
nRBC: 0 % (ref 0–0)

## 2011-05-12 LAB — TECHNOLOGIST REVIEW

## 2011-05-12 LAB — COMPREHENSIVE METABOLIC PANEL
BUN: 18 mg/dL (ref 6–23)
CO2: 30 mEq/L (ref 19–32)
Calcium: 9.1 mg/dL (ref 8.4–10.5)
Chloride: 103 mEq/L (ref 96–112)
Creatinine, Ser: 1.06 mg/dL (ref 0.50–1.10)

## 2011-05-12 MED ORDER — SODIUM CHLORIDE 0.9 % IV SOLN
600.0000 mg/m2 | Freq: Once | INTRAVENOUS | Status: AC
  Administered 2011-05-12: 1120 mg via INTRAVENOUS
  Filled 2011-05-12: qty 56

## 2011-05-12 MED ORDER — FLUOROURACIL CHEMO INJECTION 2.5 GM/50ML
600.0000 mg/m2 | Freq: Once | INTRAVENOUS | Status: AC
  Administered 2011-05-12: 1100 mg via INTRAVENOUS
  Filled 2011-05-12: qty 22

## 2011-05-12 MED ORDER — HEPARIN SOD (PORK) LOCK FLUSH 100 UNIT/ML IV SOLN
500.0000 [IU] | Freq: Once | INTRAVENOUS | Status: AC | PRN
  Administered 2011-05-12: 500 [IU]
  Filled 2011-05-12: qty 5

## 2011-05-12 MED ORDER — METHOTREXATE SODIUM CHEMO INJECTION 25 MG/ML
40.0000 mg/m2 | Freq: Once | INTRAMUSCULAR | Status: AC
  Administered 2011-05-12: 75 mg via INTRAVENOUS
  Filled 2011-05-12: qty 3

## 2011-05-12 MED ORDER — DEXAMETHASONE SODIUM PHOSPHATE 10 MG/ML IJ SOLN
10.0000 mg | Freq: Once | INTRAMUSCULAR | Status: AC
  Administered 2011-05-12: 10 mg via INTRAVENOUS

## 2011-05-12 MED ORDER — ONDANSETRON 8 MG/50ML IVPB (CHCC)
8.0000 mg | Freq: Once | INTRAVENOUS | Status: AC
  Administered 2011-05-12: 8 mg via INTRAVENOUS

## 2011-05-12 MED ORDER — SODIUM CHLORIDE 0.9 % IV SOLN
Freq: Once | INTRAVENOUS | Status: AC
  Administered 2011-05-12: 12:00:00 via INTRAVENOUS

## 2011-05-12 MED ORDER — SODIUM CHLORIDE 0.9 % IJ SOLN
10.0000 mL | INTRAMUSCULAR | Status: DC | PRN
  Administered 2011-05-12: 10 mL
  Filled 2011-05-12: qty 10

## 2011-05-12 NOTE — Progress Notes (Signed)
Hematology and Oncology Follow Up Visit  [name] [id number] [birth year] 51 y.o. 05/12/2011    HPI: [name] is a 51 year old [facility] [place] woman with a locally advanced ER/PR positive at 99/2% respectively, HER-2 positive, cish ratio of 2.87, left breast carcinoma with biopsy-proven lymph node involvement. Initial staging breast MRI on 02/05/2011 showing a mass measuring 4.9 cm with multiple small nodules without fatty hila in the left axilla suspicious for metastatic disease. She completed 2 cycles of every 3 week neoadjuvant Taxotere/carboplatin, due to significant difficulty with the Taxotere component based i.e. significant PPE changes, and chest pain with her second infusion, her treatment course was switched to every 3 week IV CMF/Herceptin. Currently for day 1 cycle 2 of every 3 week IV CMF/Herceptin. 2. History of congestive heart failure, echocardiogram from 01/23 2013 revealing a left ventricular ejection fraction between 50 and 55%. Also on Lasix 120 mg in the a.m., 80 mg in the p.m. HCTZ 25 mg by mouth daily, lisinopril 20 mg by mouth daily, Coreg 25 mg by mouth twice a day 3. Hypothyroidism-on Synthroid 100 mcg daily 4. COPD 5. GERD on Prilosec 20 mg by mouth twice a day 6. Depression on Zoloft 100 mg by mouth daily 7. Reported history of myocardial infarction 7 years ago.  Interim History:   [name] is seen today with her dear friend in accompaniment for followup prior to day 1 cycle 2 of q. 3 week IV CMF/Herceptin. Of note, she had a 2-D echocardiogram performed on 05/05/2011 which revealed a drop in her left ventricular ejection fraction. She states that she was dated to undergo a "stress test" with Dr. Mayford Knife tomorrow, but she canceled this appointment due to the fact that she was due for chemotherapy today. She has quite a bit of fatigue. She also states that she feels that her lower extremities are "numb". Upon further questioning, if more that her profound fatigue is  causing her to feel like her legs are quite heavy. Her sense of feeling is intact. She has shortness of breath with exertion that she denies chest pain. She denies fevers, chills, night sweats, nausea emesis diarrhea or constipation issues. No bleeding or bruising symptoms. She has been taking ciprofloxacin 500 mg by mouth twice a day for a febrile neutropenia. She also states that her "eyes are killing me". Specifically there quite irritated. She states that she had a prescription for Restasis which has not helped at all, but upon review and discussion with her pharmacy, she is actually been on TobraDex.  A detailed review of systems is otherwise noncontributory as noted below.  Review of Systems: Constitutional:  fatigued and generally weak Eyes: excessive tearing ENT: No complaints Cardiovascular: positive for - shortness of breath Respiratory: positive for - shortness of breath Neurological: no TIA or stroke symptoms Dermatological: negative Gastrointestinal: no abdominal pain, change in bowel habits, or black or bloody stools Genito-Urinary: no dysuria, trouble voiding, or hematuria Hematological and Lymphatic: positive for - pallor Breast: negative for breast lumps Patient unable to palpate L breast mass. Musculoskeletal: negative Remaining ROS negative.  Family History: .  Leukemia  Mother    The patient's father died at the age of 65 from renal failure; The patient's mother died at the age of 31 from acute leukemia. The patient has 2 brothers and 2 sisters. There is no history of breast or ovarian cancer in the family to her knowledge.    Gynecologic History: Menarche age 8, menopause 2005, the patient never  took hormone replacement. She is GX P3, first pregnancy to term age 68.    Social History: The patient is currently unemployed; previously she worked as an Building control surveyor. She is divorced and lives by herself. Her children are [name], lives in [place] and works for the post  office, she is 50 years old; [name], 26 a lives in [place] and works for [employer], and [name], 21, who lives in [place] and works in daycare. The patient has 5 grandchildren. She attends the [employer] in [place].    Medications:   I have reviewed the patient's current medications.  Current Outpatient Prescriptions  Medication Sig Dispense Refill  . Alum & Mag Hydroxide-Simeth (MAGIC MOUTHWASH W/LIDOCAINE) SOLN Take 5 mLs by mouth 4 (four) times daily as needed.  240 mL  3  . carvedilol (COREG) 25 MG tablet Take 25 mg by mouth 2 (two) times daily with a meal.        . dexamethasone (DECADRON) 2 MG tablet Take 8 mg by mouth 2 (two) times daily with a meal. Pt takes 4 tabs for 8mg  dose       . dexamethasone (DECADRON) 4 MG tablet Take 8 mg by mouth 2 (two) times daily with a meal. Has not started this medication yet      . furosemide (LASIX) 40 MG tablet Take 40 mg by mouth daily. 3 in Am  2 in pm       . hydrochlorothiazide (HYDRODIURIL) 25 MG tablet Take 25 mg by mouth daily.        . Ibuprofen-Diphenhydramine HCl (ADVIL PM) 200-25 MG CAPS Take 3 capsules by mouth at bedtime.        [name] levothyroxine (SYNTHROID, LEVOTHROID) 100 MCG tablet Take 100 mcg by mouth daily.        [name] lidocaine-prilocaine (EMLA) cream Apply topically as needed.  30 g  0  . lisinopril (PRINIVIL,ZESTRIL) 20 MG tablet Take 20 mg by mouth daily.        . magnesium oxide (MAG-OX) 400 MG tablet Take 800 mg by mouth daily.        . nicotine (NICODERM CQ - DOSED IN MG/24 HOURS) 21 mg/24hr patch Place 1 patch onto the skin daily.        . nitroGLYCERIN (NITROSTAT) 0.4 MG SL tablet Place 0.4 mg under the tongue every 5 (five) minutes as needed. As needed for chest pain.      [name] omeprazole (PRILOSEC) 20 MG capsule Take 20 mg by mouth 2 (two) times daily.       . ondansetron (ZOFRAN) 8 MG tablet Take by mouth every 12 (twelve) hours as needed.        [name] OVER THE COUNTER MEDICATION Take 3 capsules  by mouth every morning. Green Tea Extract OTC.       [name] oxyCODONE-acetaminophen (PERCOCET) 5-325 MG per tablet Take 1 tablet by mouth 2 (two) times daily as needed. As needed for pain.      . potassium chloride SA (K-DUR,KLOR-CON) 20 MEQ tablet Take 20 mEq by mouth daily.       . prochlorperazine (COMPAZINE) 10 MG tablet Take 1 tablet (10 mg total) by mouth every 6 (six) hours as needed.  30 tablet  3  . sertraline (ZOLOFT) 100 MG tablet Take 100 mg by mouth daily.        [name] tobramycin-dexamethasone (TOBRADEX) ophthalmic solution Place 1 drop into both eyes every 4 (four) hours while awake.        [name]  zolpidem (AMBIEN) 10 MG tablet Take 10 mg by mouth at bedtime.        No current facility-administered medications for this visit.   Facility-Administered Medications Ordered in Other Visits  Medication Dose Route Frequency Provider Last Rate Last Dose  . 0.9 %  sodium chloride infusion   Intravenous Once Zollie Scale, PA      . cyclophosphamide (CYTOXAN) 1,120 mg in sodium chloride 0.9 % 250 mL chemo infusion  600 mg/m2 (Treatment Plan Actual) Intravenous Once Zollie Scale, PA   1,120 mg at 05/12/11 1338  . dexamethasone (DECADRON) injection 10 mg  10 mg Intravenous Once Zollie Scale, PA   10 mg at 05/12/11 1228  . fluorouracil (ADRUCIL) chemo injection 1,100 mg  600 mg/m2 (Treatment Plan Actual) Intravenous Once Amy Berry, PA   1,100 mg at 05/12/11 1340  . heparin lock flush 100 unit/mL  500 Units Intracatheter Once PRN Zollie Scale, PA   500 Units at 05/12/11 1441  . methotrexate chemo injection 75 mg  40 mg/m2 (Treatment Plan Actual) Intravenous Once Amy Berry, PA   75 mg at 05/12/11 1425  . ondansetron (ZOFRAN) IVPB 8 mg  8 mg Intravenous Once Zollie Scale, PA   8 mg at 05/12/11 1228  . sodium chloride 0.9 % injection 10 mL  10 mL Intracatheter PRN Lowella Dell, MD      . DISCONTD: sodium chloride 0.9 % injection 10 mL  10 mL Intracatheter PRN Amy Berry, PA   10 mL at 05/12/11 1441    Allergies:    Allergies  Allergen Reactions  . Aspirin Hives  . Morphine And Related Hives and Swelling  . Penicillins Hives  . Taxotere Other (See Comments)    Chest tightness and pain    Physical Exam: Filed Vitals:   05/12/11 1048  BP: 125/86  Pulse: 84  Temp: 99 F (37.2 C)  Weight: 197 lbs. HEENT:  Sclerae Slightly injected but no evidence of any today., conjunctivae pink,  Oropharynx clear.  No frank evidence to suggest oral mucositis or candidiasis.   Nodes:  No cervical, supraclavicular, or axillary lymphadenopathy palpated.  Breast Exam: The left breast was examined, there is no evidence of any obvious definable mass within the upper outer quadrant, there is no nipple inversion or discharge and the axilla is also clear of any palpable lymphadenopathy. Right breast was examined by Dr. Donnie Coffin, no evidence of dominant mass effect nipple inversion or discharge axilla clear. Lungs:  Clear to auscultation bilaterally.  No crackles, rhonchi, or wheezes.   Heart:  Regular rate and rhythm, mild right-sided JVD. Abdomen:  Soft, nontender.  Positive bowel sounds.  No organomegaly or masses palpated.   Musculoskeletal:  No focal spinal tenderness to palpation.  Extremities:  Benign.  No peripheral edema or cyanosis.   Skin:  Benign.   Neuro:  Nonfocal, alert and oriented x 3.   Lab Results: Lab Results  Component Value Date   WBC 6.3 05/12/2011   HGB 9.4* 05/12/2011   HCT 28.5* 05/12/2011   MCV 88.0 05/12/2011   PLT 227 05/12/2011   NEUTROABS 3.8 05/12/2011     Chemistry      Component Value Date/Time   NA 139 05/12/2011 1035   K 3.6 05/12/2011 1035   CL 103 05/12/2011 1035   CO2 30 05/12/2011 1035   BUN 18 05/12/2011 1035   CREATININE 1.06 05/12/2011 1035      Component Value Date/Time   CALCIUM 9.1 05/12/2011 1035  ALKPHOS (Revised) 103 05/12/2011 1035   AST (Revised) 30 05/12/2011 1035   ALT (Revised) 27 05/12/2011 1035   BILITOT (Revised) 0.2* 05/12/2011 1035      Echocardiogram: 05/05/11 Study Conclusions - Left ventricle: The cavity size was normal. There was mild concentric hypertrophy. Systolic function was mildly to moderately reduced. The estimated ejection fraction was in the range of 40% to 45%. Although no diagnostic regional wall motion abnormality was identified, this possibility cannot be completely excluded on the basis of this study. Doppler parameters are consistent with abnormal left ventricular relaxation (grade 1 diastolic dysfunction). - Mitral valve: Mild regurgitation. Impressions: - When compared to prior study from Apr 14, 2011, EF appears to have decreased from 50% to 40-45%. Transthoracic echocardiography. M-mode, complete 2D, spectral Doppler, and color Doppler. Patient status: Inpatient. Location: Bedside. Prepared and Electronically Authenticated by  Donato Schultz, MD  Assessment:  Carlena is a 28 year old [facility] [place] woman with a locally advanced ER/PR positive at 99/2% respectively, HER-2 positive with the cish ratio of 2.87, left breast carcinoma with biopsy-proven lymph node involvement. Initial staging breast MRI on 02/05/2011 showing a mass measuring 4.9 cm with multiple small nodules without fatty hila in the left axilla suspicious for metastatic disease. She completed 2 cycles of every 3 week neoadjuvant Taxotere/carboplatin, due to significant difficulty with the Taxotere component based i.e. significant PPE changes, and chest pain with her second infusion, her treatment course was switched to every 3 week IV CMF/Herceptin. Currently for day 1,cycle 2 of every 3 week IV CMF/Herceptin. 2. History of congestive heart failure, echocardiogram from 01/23 2013 revealing a left ventricular ejection fraction between 50 and 55%. Also on Lasix 120 mg in the a.m., 80 mg in the p.m. HCTZ 25 mg by mouth daily, lisinopril 20 mg by mouth daily, Coreg 25 mg by mouth twice a day. Worsening left ventricular ejection  fraction reported between 40-45%. 3. Hypothyroidism-on Synthroid 100 mcg daily 4. COPD 5. GERD on Prilosec 20 mg by mouth twice a day 6. Depression on Zoloft 100 mg by mouth daily   Case has been reviewed with Dr. Pierce Crane who also examined the patient.  Plan:  At this point, we will need to hold Herceptin. Patient will contact Dr. Mayford Knife in regards to rescheduling her stress test. She is clinically stable enough to undergo CMF today as scheduled. She understands we will need to utilize Neulasta on day 2 given the fact that she had a delayed profound neutropenia following her first cycle of CMF. We will also obtain bilateral breast MRIs to assess clinical response. We may need to consider referral to her surgeon for definitive surgical intervention sooner than later. This was discussed at length with both Ms. Hubbard and her friend. She understands and agrees with this plan. She will be seen one weeks time for nadir assessment, and to go over the results of the bilateral breast MRIs.  This plan was reviewed with the patient, who voices understanding and agreement.  She knows to call with any changes or problems.    Hershel Corkery T, PA-C 05/12/2011

## 2011-05-13 ENCOUNTER — Ambulatory Visit (HOSPITAL_BASED_OUTPATIENT_CLINIC_OR_DEPARTMENT_OTHER): Payer: Medicare Other

## 2011-05-13 VITALS — BP 133/85 | HR 89 | Temp 98.2°F

## 2011-05-13 DIAGNOSIS — C50919 Malignant neoplasm of unspecified site of unspecified female breast: Secondary | ICD-10-CM

## 2011-05-13 MED ORDER — PEGFILGRASTIM INJECTION 6 MG/0.6ML
6.0000 mg | Freq: Once | SUBCUTANEOUS | Status: AC
  Administered 2011-05-13: 6 mg via SUBCUTANEOUS
  Filled 2011-05-13: qty 0.6

## 2011-05-14 DIAGNOSIS — I428 Other cardiomyopathies: Secondary | ICD-10-CM | POA: Diagnosis not present

## 2011-05-14 DIAGNOSIS — R079 Chest pain, unspecified: Secondary | ICD-10-CM | POA: Diagnosis not present

## 2011-05-14 DIAGNOSIS — I10 Essential (primary) hypertension: Secondary | ICD-10-CM | POA: Diagnosis not present

## 2011-05-14 DIAGNOSIS — I5022 Chronic systolic (congestive) heart failure: Secondary | ICD-10-CM | POA: Diagnosis not present

## 2011-05-18 ENCOUNTER — Other Ambulatory Visit: Payer: Medicare Other | Admitting: Lab

## 2011-05-18 ENCOUNTER — Ambulatory Visit: Payer: Medicare Other | Admitting: Physician Assistant

## 2011-05-19 ENCOUNTER — Telehealth: Payer: Self-pay | Admitting: *Deleted

## 2011-05-19 ENCOUNTER — Encounter: Payer: Self-pay | Admitting: Physician Assistant

## 2011-05-19 ENCOUNTER — Ambulatory Visit: Payer: Medicare Other | Admitting: Lab

## 2011-05-19 ENCOUNTER — Encounter (HOSPITAL_COMMUNITY)
Admission: RE | Admit: 2011-05-19 | Discharge: 2011-05-19 | Disposition: A | Discharge status: Home / Self Care | Payer: Medicare Other | Source: Ambulatory Visit | Attending: Oncology | Admitting: Oncology

## 2011-05-19 ENCOUNTER — Ambulatory Visit (HOSPITAL_COMMUNITY)
Admission: RE | Admit: 2011-05-19 | Discharge: 2011-05-19 | Disposition: A | Discharge status: Home / Self Care | Payer: Medicare Other | Source: Ambulatory Visit | Attending: Physician Assistant | Admitting: Physician Assistant

## 2011-05-19 ENCOUNTER — Other Ambulatory Visit: Payer: Medicare Other | Admitting: Lab

## 2011-05-19 ENCOUNTER — Ambulatory Visit (HOSPITAL_BASED_OUTPATIENT_CLINIC_OR_DEPARTMENT_OTHER): Payer: Medicare Other | Admitting: Physician Assistant

## 2011-05-19 VITALS — BP 125/85 | HR 85 | Temp 98.6°F | Ht <= 58 in | Wt 197.9 lb

## 2011-05-19 DIAGNOSIS — D649 Anemia, unspecified: Secondary | ICD-10-CM

## 2011-05-19 DIAGNOSIS — D709 Neutropenia, unspecified: Secondary | ICD-10-CM | POA: Diagnosis not present

## 2011-05-19 DIAGNOSIS — C50919 Malignant neoplasm of unspecified site of unspecified female breast: Secondary | ICD-10-CM

## 2011-05-19 DIAGNOSIS — I509 Heart failure, unspecified: Secondary | ICD-10-CM

## 2011-05-19 DIAGNOSIS — C50912 Malignant neoplasm of unspecified site of left female breast: Secondary | ICD-10-CM

## 2011-05-19 HISTORY — DX: Anemia, unspecified: D64.9

## 2011-05-19 LAB — CBC WITH DIFFERENTIAL/PLATELET
Basophils Absolute: 0 10*3/uL (ref 0.0–0.1)
EOS%: 0 % (ref 0.0–7.0)
Eosinophils Absolute: 0 10*3/uL (ref 0.0–0.5)
HCT: 23.8 % — ABNORMAL LOW (ref 34.8–46.6)
HGB: 8.1 g/dL — ABNORMAL LOW (ref 11.6–15.9)
MONO#: 0.1 10*3/uL (ref 0.1–0.9)
NEUT#: 0.1 10*3/uL — CL (ref 1.5–6.5)
NEUT%: 7.1 % — ABNORMAL LOW (ref 38.4–76.8)
RDW: 18.9 % — ABNORMAL HIGH (ref 11.2–14.5)
WBC: 1 10*3/uL — ABNORMAL LOW (ref 3.9–10.3)
lymph#: 0.8 10*3/uL — ABNORMAL LOW (ref 0.9–3.3)

## 2011-05-19 LAB — TYPE & CROSSMATCH - CHCC

## 2011-05-19 MED ORDER — GADOBENATE DIMEGLUMINE 529 MG/ML IV SOLN
19.0000 mL | Freq: Once | INTRAVENOUS | Status: AC | PRN
  Administered 2011-05-19: 19 mL via INTRAVENOUS

## 2011-05-19 NOTE — Telephone Encounter (Signed)
sent patient back to the lab for type and cross on 05-19-2011

## 2011-05-19 NOTE — Progress Notes (Signed)
Hematology and Oncology Follow Up Visit  [name] [id number] Apr 20, 1960 51 y.o. 05/19/2011    HPI: [name] is a 51 year old [facility] [place] woman with a locally advanced ER/PR positive at 99/2% respectively, HER-2 positive, cish ratio of 2.87, left breast carcinoma with biopsy-proven lymph node involvement. Initial staging breast MRI on 02/05/2011 showing a mass measuring 4.9 cm with multiple small nodules without fatty hila in the left axilla suspicious for metastatic disease. She completed 2 cycles of every 3 week neoadjuvant Taxotere/carboplatin, due to significant difficulty with the Taxotere component based i.e. significant PPE changes, and chest pain with her second infusion, her treatment course was switched to every 3 week IV CMF/Herceptin. Currently day 7 cycle 2 of every 3 week IV CMF, Herceptin held due to worsening left ventricular ejection fraction. 2. History of congestive heart failure, echocardiogram from 01/23 2013 revealing a left ventricular ejection fraction between 50 and 55%. Also on Lasix 120 mg in the a.m., 80 mg in the p.m. HCTZ 25 mg by mouth daily, lisinopril 20 mg by mouth daily, Coreg 25 mg by mouth twice a day 3. Hypothyroidism-on Synthroid 100 mcg daily 4. COPD 5. GERD on Prilosec 20 mg by mouth twice a day 6. Depression on Zoloft 100 mg by mouth daily 7. Reported history of myocardial infarction 7 years ago.  Interim History:   [name] is seen today with her dear friend in accompaniment for followup after her second cycle of q. 3 week IV CMF. Herceptin has been held due to her latest 2D echocardiogram revealing her left ventricular ejection fraction had dropped to 40-45%. The patient states that she was seen by Dr. Mayford Knife and had a stress test performed and that all was "normal". We do not have the report available as of today. Patient has had a bilateral breast MRI performed prior to her office visit today, the results are pending. She does describe quite a  bit of fatigue, she denies any shortness of breath or chest pain. She denies nausea, emesis, diarrhea, or constipation. She denies fevers, chills, or night sweats.  She did receive Neulasta on day 2. A detailed review of systems is otherwise noncontributory as noted below.  Review of Systems: Constitutional:  fatigued and generally weak Eyes: excessive tearing ENT: No complaints Cardiovascular: positive for - shortness of breath Respiratory: positive for - shortness of breath Neurological: no TIA or stroke symptoms Dermatological: negative Gastrointestinal: no abdominal pain, change in bowel habits, or black or bloody stools Genito-Urinary: no dysuria, trouble voiding, or hematuria Hematological and Lymphatic: positive for - pallor Breast: negative for breast lumps Patient unable to palpate L breast mass. Musculoskeletal: negative Remaining ROS negative.   Medications:   I have reviewed the patient's current medications.  Current Outpatient Prescriptions  Medication Sig Dispense Refill  . Alum & Mag Hydroxide-Simeth (MAGIC MOUTHWASH W/LIDOCAINE) SOLN Take 5 mLs by mouth 4 (four) times daily as needed.  240 mL  3  . carvedilol (COREG) 25 MG tablet Take 25 mg by mouth 2 (two) times daily with a meal.        . dexamethasone (DECADRON) 2 MG tablet Take 8 mg by mouth 2 (two) times daily with a meal. Pt takes 4 tabs for 8mg  dose       . dexamethasone (DECADRON) 4 MG tablet Take 8 mg by mouth 2 (two) times daily with a meal. Has not started this medication yet      . furosemide (LASIX) 40 MG tablet Take 40  mg by mouth daily. 3 in Am  2 in pm       . hydrochlorothiazide (HYDRODIURIL) 25 MG tablet Take 25 mg by mouth daily.        . Ibuprofen-Diphenhydramine HCl (ADVIL PM) 200-25 MG CAPS Take 3 capsules by mouth at bedtime.        [name] levothyroxine (SYNTHROID, LEVOTHROID) 100 MCG tablet Take 100 mcg by mouth daily.        [name] lidocaine-prilocaine (EMLA) cream Apply topically as needed.  30 g  0    . lisinopril (PRINIVIL,ZESTRIL) 20 MG tablet Take 20 mg by mouth daily.        . magnesium oxide (MAG-OX) 400 MG tablet Take 800 mg by mouth daily.        . nicotine (NICODERM CQ - DOSED IN MG/24 HOURS) 21 mg/24hr patch Place 1 patch onto the skin daily.        . nitroGLYCERIN (NITROSTAT) 0.4 MG SL tablet Place 0.4 mg under the tongue every 5 (five) minutes as needed. As needed for chest pain.      [name] omeprazole (PRILOSEC) 20 MG capsule Take 20 mg by mouth 2 (two) times daily.       . ondansetron (ZOFRAN) 8 MG tablet Take by mouth every 12 (twelve) hours as needed.        [name] OVER THE COUNTER MEDICATION Take 3 capsules by mouth every morning. Green Tea Extract OTC.       [name] oxyCODONE-acetaminophen (PERCOCET) 5-325 MG per tablet Take 1 tablet by mouth 2 (two) times daily as needed. As needed for pain.      . potassium chloride SA (K-DUR,KLOR-CON) 20 MEQ tablet Take 20 mEq by mouth daily.       . prochlorperazine (COMPAZINE) 10 MG tablet Take 1 tablet (10 mg total) by mouth every 6 (six) hours as needed.  30 tablet  3  . sertraline (ZOLOFT) 100 MG tablet Take 100 mg by mouth daily.        [name] tobramycin-dexamethasone (TOBRADEX) ophthalmic solution Place 1 drop into both eyes every 4 (four) hours while awake.        . zolpidem (AMBIEN) 10 MG tablet Take 10 mg by mouth at bedtime.        No current facility-administered medications for this visit.   Facility-Administered Medications Ordered in Other Visits  Medication Dose Route Frequency Provider Last Rate Last Dose  . gadobenate dimeglumine (MULTIHANCE) injection 19 mL  19 mL Intravenous Once PRN Medication Radiologist, MD   19 mL at 05/19/11 1256  . sodium chloride 0.9 % injection 10 mL  10 mL Intracatheter PRN Lowella Dell, MD        Allergies:  Allergies  Allergen Reactions  . Aspirin Hives  . Docetaxel Other (See Comments)    Chest tightness and pain  . Morphine And Related Hives and Swelling  . Penicillins Hives    Physical  Exam: Filed Vitals:   05/19/11 1322  BP: 125/85  Pulse: 85  Temp: 98.6 F (37 C)  Weight: 197 lbs. HEENT:  Sclerae Slightly injected but no evidence of any today., conjunctivae pink,  Oropharynx clear.  No frank evidence to suggest oral mucositis or candidiasis.   Nodes:  No cervical, supraclavicular, or axillary lymphadenopathy palpated.  Breast Exam: Deferred. Lungs:  Clear to auscultation bilaterally.  No crackles, rhonchi, or wheezes.   Heart:  Regular rate and rhythm, mild right-sided JVD. Abdomen:  Soft, nontender.  Positive bowel sounds.  No  organomegaly or masses palpated.   Musculoskeletal:  No focal spinal tenderness to palpation.  Extremities:  Benign.  No peripheral edema or cyanosis.   Skin:  Benign.   Neuro:  Nonfocal, alert and oriented x 3.   Lab Results: Lab Results  Component Value Date   WBC 1.0* 05/19/2011   HGB 8.1* 05/19/2011   HCT 23.8* 05/19/2011   MCV 89.4 05/19/2011   PLT 98* 05/19/2011   NEUTROABS 0.1* 05/19/2011     Chemistry      Component Value Date/Time   NA 139 05/12/2011 1035   K 3.6 05/12/2011 1035   CL 103 05/12/2011 1035   CO2 30 05/12/2011 1035   BUN 18 05/12/2011 1035   CREATININE 1.06 05/12/2011 1035      Component Value Date/Time   CALCIUM 9.1 05/12/2011 1035   ALKPHOS (Revised) 103 05/12/2011 1035   AST (Revised) 30 05/12/2011 1035   ALT (Revised) 27 05/12/2011 1035   BILITOT (Revised) 0.2* 05/12/2011 1035     Echocardiogram: 05/05/11 Study Conclusions - Left ventricle: The cavity size was normal. There was mild concentric hypertrophy. Systolic function was mildly to moderately reduced. The estimated ejection fraction was in the range of 40% to 45%. Although no diagnostic regional wall motion abnormality was identified, this possibility cannot be completely excluded on the basis of this study. Doppler parameters are consistent with abnormal left ventricular relaxation (grade 1 diastolic dysfunction). - Mitral valve: Mild  regurgitation. Impressions: - When compared to prior study from Apr 14, 2011, EF appears to have decreased from 50% to 40-45%. Transthoracic echocardiography. M-mode, complete 2D, spectral Doppler, and color Doppler. Patient status: Inpatient. Location: Bedside. Prepared and Electronically Authenticated by  Donato Schultz, MD  Assessment:  [name] is a 51 year old [facility] [place] woman with a locally advanced ER/PR positive at 99/2% respectively, HER-2 positive with the cish ratio of 2.87, left breast carcinoma with biopsy-proven lymph node involvement. Initial staging breast MRI on 02/05/2011 showing a mass measuring 4.9 cm with multiple small nodules without fatty hila in the left axilla suspicious for metastatic disease. She completed 2 cycles of every 3 week neoadjuvant Taxotere/carboplatin, due to significant difficulty with the Taxotere component based i.e. significant PPE changes, and chest pain with her second infusion, her treatment course was switched to every 3 week IV CMF/Herceptin. Currently  day 7,cycle 2 of every 3 week IV CMF/Herceptin. Bilateral breast MRI results pending.  2. Severe afebrile neutropenia despite Neulasta on day 2.  3. History of congestive heart failure, echocardiogram from 01/23 2013 revealing a left ventricular ejection fraction between 50 and 55%. Also on Lasix 120 mg in the a.m., 80 mg in the p.m. HCTZ 25 mg by mouth daily, lisinopril 20 mg by mouth daily, Coreg 25 mg by mouth twice a day. Worsening left ventricular ejection fraction reported between 40-45%.  4. Hypothyroidism-on Synthroid 100 mcg daily 5. COPD  6. GERD on Prilosec 20 mg by mouth twice a day  7. Depression on Zoloft 100 mg by mouth daily  8. Symptomatic anemia.  Case to be reviewed with Dr. Theron Arista.  Plan:  [name] will initiate Cipro 500 mg by mouth twice a day for neutropenia prophylaxis. We are awaiting the bilateral breast MRIs, for definitive planning in regards to  chemotherapy versus surgical intervention. She is symptomatic in regards to her anemia, therefore I have typing crossed for 2 units of packed RBCs, tentatively to be transfused on 05/21/2011. I will plan on repeating a CBC on 05/21/2011  prior to transfusion on the off chance her hemoglobin has risen. We will need to be diligent with diuresis given her history of CHF. Patient is scheduled for a tentative followup in 2 weeks' time prior to day 1 cycle 3 of every 3 week CMF.  She knows to contact us prior to this two-week followup if the need should arise.   This plan was reviewed with the patient, who voices understanding and agreement.  She knows to call with any changes or problems.    Gedeon Brandow T, PA-C 05/19/2011

## 2011-05-21 ENCOUNTER — Ambulatory Visit: Payer: Medicare Other

## 2011-05-21 ENCOUNTER — Other Ambulatory Visit: Payer: Self-pay | Admitting: Physician Assistant

## 2011-05-21 ENCOUNTER — Encounter (HOSPITAL_COMMUNITY)
Admission: RE | Admit: 2011-05-21 | Discharge: 2011-05-21 | Disposition: A | Discharge status: Home / Self Care | Payer: Medicare Other | Source: Ambulatory Visit | Attending: Oncology | Admitting: Oncology

## 2011-05-21 ENCOUNTER — Ambulatory Visit (HOSPITAL_BASED_OUTPATIENT_CLINIC_OR_DEPARTMENT_OTHER): Payer: Medicare Other

## 2011-05-21 VITALS — BP 138/89 | HR 82 | Temp 97.1°F | Resp 20

## 2011-05-21 DIAGNOSIS — C50919 Malignant neoplasm of unspecified site of unspecified female breast: Secondary | ICD-10-CM

## 2011-05-21 DIAGNOSIS — I509 Heart failure, unspecified: Secondary | ICD-10-CM

## 2011-05-21 DIAGNOSIS — D649 Anemia, unspecified: Secondary | ICD-10-CM | POA: Diagnosis not present

## 2011-05-21 LAB — CBC WITH DIFFERENTIAL/PLATELET
BASO%: 0.5 % (ref 0.0–2.0)
Basophils Absolute: 0 10*3/uL (ref 0.0–0.1)
EOS%: 0 % (ref 0.0–7.0)
HGB: 7.8 g/dL — ABNORMAL LOW (ref 11.6–15.9)
MCH: 29 pg (ref 25.1–34.0)
MCHC: 33.1 g/dL (ref 31.5–36.0)
MCV: 87.7 fL (ref 79.5–101.0)
MONO%: 28.1 % — ABNORMAL HIGH (ref 0.0–14.0)
NEUT%: 18.4 % — ABNORMAL LOW (ref 38.4–76.8)
RDW: 17.8 % — ABNORMAL HIGH (ref 11.2–14.5)

## 2011-05-21 MED ORDER — ACETAMINOPHEN 325 MG PO TABS
650.0000 mg | ORAL_TABLET | Freq: Once | ORAL | Status: AC
  Administered 2011-05-21: 650 mg via ORAL

## 2011-05-21 MED ORDER — FUROSEMIDE 10 MG/ML IJ SOLN
10.0000 mg | Freq: Once | INTRAMUSCULAR | Status: AC
  Administered 2011-05-21: 10 mg via INTRAVENOUS

## 2011-05-21 MED ORDER — DIPHENHYDRAMINE HCL 50 MG/ML IJ SOLN
25.0000 mg | Freq: Once | INTRAMUSCULAR | Status: AC
  Administered 2011-05-21: 25 mg via INTRAVENOUS

## 2011-05-21 MED ORDER — HEPARIN SOD (PORK) LOCK FLUSH 100 UNIT/ML IV SOLN
500.0000 [IU] | Freq: Once | INTRAVENOUS | Status: AC
  Administered 2011-05-21: 500 [IU] via INTRAVENOUS
  Filled 2011-05-21: qty 5

## 2011-05-21 MED ORDER — SODIUM CHLORIDE 0.9 % IJ SOLN
10.0000 mL | INTRAMUSCULAR | Status: DC | PRN
  Administered 2011-05-21: 10 mL via INTRAVENOUS
  Filled 2011-05-21: qty 10

## 2011-05-21 MED ORDER — FUROSEMIDE 10 MG/ML IJ SOLN: 10.0000 mg | Freq: Once | INTRAMUSCULAR | Status: DC

## 2011-05-21 MED ORDER — SODIUM CHLORIDE 0.9 % IV SOLN
INTRAVENOUS | Status: DC
  Administered 2011-05-21: 10:00:00 via INTRAVENOUS

## 2011-05-21 MED ORDER — FUROSEMIDE 10 MG/ML IJ SOLN
10.0000 mg | Freq: Once | INTRAMUSCULAR | Status: AC
  Administered 2011-05-21: 12:00:00 via INTRAVENOUS

## 2011-05-21 NOTE — Progress Notes (Signed)
Per Debbora Presto PA, pt. is to receive 10mg  Lasix after first unit and 10mg  Lasix post 2nd unit.  Myriam Jacobson

## 2011-05-21 NOTE — Patient Instructions (Signed)
Patient aware of next appointment; discharged home with friend and no complaints. 

## 2011-05-22 LAB — TYPE AND SCREEN
Antibody Screen: NEGATIVE
Unit division: 0

## 2011-05-24 DIAGNOSIS — Z79899 Other long term (current) drug therapy: Secondary | ICD-10-CM | POA: Diagnosis not present

## 2011-05-24 DIAGNOSIS — M545 Low back pain: Secondary | ICD-10-CM | POA: Diagnosis not present

## 2011-05-24 DIAGNOSIS — F329 Major depressive disorder, single episode, unspecified: Secondary | ICD-10-CM | POA: Diagnosis not present

## 2011-05-24 DIAGNOSIS — E039 Hypothyroidism, unspecified: Secondary | ICD-10-CM | POA: Diagnosis not present

## 2011-05-24 DIAGNOSIS — J019 Acute sinusitis, unspecified: Secondary | ICD-10-CM | POA: Diagnosis not present

## 2011-05-25 ENCOUNTER — Other Ambulatory Visit (HOSPITAL_COMMUNITY): Payer: Medicare Other

## 2011-05-27 ENCOUNTER — Ambulatory Visit (HOSPITAL_COMMUNITY)
Admission: RE | Admit: 2011-05-27 | Discharge: 2011-05-27 | Disposition: A | Discharge status: Home / Self Care | Payer: Medicare Other | Source: Ambulatory Visit | Attending: Physician Assistant | Admitting: Physician Assistant

## 2011-05-27 ENCOUNTER — Ambulatory Visit (HOSPITAL_BASED_OUTPATIENT_CLINIC_OR_DEPARTMENT_OTHER): Payer: Medicare Other | Admitting: Genetic Counselor

## 2011-05-27 DIAGNOSIS — I509 Heart failure, unspecified: Secondary | ICD-10-CM | POA: Diagnosis not present

## 2011-05-27 DIAGNOSIS — I379 Nonrheumatic pulmonary valve disorder, unspecified: Secondary | ICD-10-CM | POA: Diagnosis not present

## 2011-05-27 DIAGNOSIS — Z09 Encounter for follow-up examination after completed treatment for conditions other than malignant neoplasm: Secondary | ICD-10-CM | POA: Diagnosis not present

## 2011-05-27 DIAGNOSIS — C50919 Malignant neoplasm of unspecified site of unspecified female breast: Secondary | ICD-10-CM | POA: Diagnosis not present

## 2011-05-27 DIAGNOSIS — Z79899 Other long term (current) drug therapy: Secondary | ICD-10-CM | POA: Insufficient documentation

## 2011-05-27 DIAGNOSIS — Z1501 Genetic susceptibility to malignant neoplasm of breast: Secondary | ICD-10-CM

## 2011-05-27 DIAGNOSIS — E669 Obesity, unspecified: Secondary | ICD-10-CM | POA: Insufficient documentation

## 2011-05-27 DIAGNOSIS — D649 Anemia, unspecified: Secondary | ICD-10-CM | POA: Diagnosis not present

## 2011-05-27 NOTE — Progress Notes (Signed)
  Echocardiogram 2D Echocardiogram has been performed.  [name] [facility] 05/27/2011, 1:35 PM

## 2011-05-27 NOTE — Progress Notes (Signed)
Dr.  Donnie Coffin requested a consultation for genetic counseling and risk assessment for [name], a 51 y.o. female, for discussion of her personal history of breast cancer. She presents to clinic today to discuss the possibility of a genetic predisposition to cancer, and to further clarify her risks, as well as her family members' risks for cancer.   HISTORY OF PRESENT ILLNESS: In November 2012, at the age of 44, [name] was diagnosed with invasive ductal carcinoma of the left breast. This has been treated with chemotherapy.  No surgery has been scheduled.    Past Medical History  Diagnosis Date  . Depression   . Hypothyroidism   . Sleep apnea   . Congestive heart failure   . Emphysema of lung   . GERD (gastroesophageal reflux disease)   . Heart murmur   . Hypertension   . Breast cancer     left breast  . Anemia 05/19/2011    Past Surgical History  Procedure Date  . Abdominal hysterectomy   . [place]-   . Slapingooophorectomy, right remote  . Portacath placement 02/26/2011    Procedure: INSERTION PORT-A-CATH;  Surgeon: Shelly Rubenstein, MD;  Location: [facility] OR;  Service: General;  Laterality: Right;  . Breast biopsy     left breast    History  Substance Use Topics  . Smoking status: Former Smoker -- 1.0 packs/day    Quit date: 02/20/2011  . Smokeless tobacco: Never Used  . Alcohol Use: 0.0 oz/week    5-7 Glasses of wine per week    REPRODUCTIVE HISTORY AND PERSONAL RISK ASSESSMENT FACTORS: Menarche was at age 44.   Patient thinks she is peri-menopausal Uterus Intact: No Ovaries Intact: Yes G3P3A0 , first live birth at age 64  She has not previously undergone treatment for infertility.   OCP Use for ~4 years   She has not used HRT in the past.    FAMILY HISTORY:  We obtained a detailed, 4-generation family history.  Significant diagnoses are listed below: Family History  Problem Relation Age of Onset  . Leukemia Mother   . Heart disease Maternal Grandmother     The patient was diagnosed with breast cancer at age 54.  Her mother was diagnosed with leukemia and died at age 40.  Her mother's sister was also diagnosed with leukemia in her 30s and is currently living.  Ms. Graybeal has a maternal cousin who was diagnosed with lung cancer and died at age 16.  No other reported cancers on the maternal side of the family.  The patient did not report any cancer diagnoses on her paternal side of the family.  Patient's maternal and paternal ancestors are of African American descent. There is no reported Ashkenazi Jewish ancestry. There is no  known consanguinity.  GENETIC COUNSELING RISK ASSESSMENT, DISCUSSION, AND SUGGESTED FOLLOW UP: We reviewed the natural history and genetic etiology of sporadic, familial and hereditary cancer syndromes.  Approximately 5-10% of all breast cancers are hereditary.  The BRCA1 and BRCA2 mutations make up approximately 85% of hereditary breast cancers.  Based on her personal and family history of breast cancer, she meets the NCCN guidelines for being at increased risk.  However, she does not meet the Medicare guidelines for testing.  Knowing that she would have to pay out of pocket for the test, Ms. Herder declined testing.  I reassured her that she does not have a family history of breast cancer or other related cancers that would place her  at a higher risk.  The patient was seen for a total of 60 minutes, greater than 50% of which was spent face-to-face counseling.  This note will also be sent to the referring provider via the electronic medical record. The patient will be supplied with a summary of this genetic counseling discussion as well as educational information on the discussed hereditary cancer syndromes following the conclusion of their visit.   Patient was discussed with Dr. Drue Second.  EPIC CC: Dr. Darnelle Catalan  _______________________________________________________________________ For Office Staff:  Number of people involved  in session: 3 Was an Intern/ student involved with case: not applicable

## 2011-06-01 ENCOUNTER — Ambulatory Visit: Payer: Medicare Other | Admitting: Physician Assistant

## 2011-06-01 ENCOUNTER — Ambulatory Visit (HOSPITAL_BASED_OUTPATIENT_CLINIC_OR_DEPARTMENT_OTHER): Payer: Medicare Other | Admitting: Physician Assistant

## 2011-06-01 ENCOUNTER — Ambulatory Visit (HOSPITAL_BASED_OUTPATIENT_CLINIC_OR_DEPARTMENT_OTHER): Payer: Medicare Other

## 2011-06-01 ENCOUNTER — Telehealth: Payer: Self-pay | Admitting: *Deleted

## 2011-06-01 ENCOUNTER — Other Ambulatory Visit (HOSPITAL_BASED_OUTPATIENT_CLINIC_OR_DEPARTMENT_OTHER): Payer: Medicare Other | Admitting: Lab

## 2011-06-01 VITALS — BP 122/80 | HR 88 | Temp 98.7°F | Ht <= 58 in | Wt 197.6 lb

## 2011-06-01 DIAGNOSIS — Z5111 Encounter for antineoplastic chemotherapy: Secondary | ICD-10-CM

## 2011-06-01 DIAGNOSIS — C50919 Malignant neoplasm of unspecified site of unspecified female breast: Secondary | ICD-10-CM

## 2011-06-01 DIAGNOSIS — C50912 Malignant neoplasm of unspecified site of left female breast: Secondary | ICD-10-CM

## 2011-06-01 DIAGNOSIS — Z170 Estrogen receptor positive status [ER+]: Secondary | ICD-10-CM | POA: Diagnosis not present

## 2011-06-01 DIAGNOSIS — D709 Neutropenia, unspecified: Secondary | ICD-10-CM | POA: Diagnosis not present

## 2011-06-01 DIAGNOSIS — I509 Heart failure, unspecified: Secondary | ICD-10-CM

## 2011-06-01 DIAGNOSIS — Z09 Encounter for follow-up examination after completed treatment for conditions other than malignant neoplasm: Secondary | ICD-10-CM | POA: Diagnosis not present

## 2011-06-01 DIAGNOSIS — D649 Anemia, unspecified: Secondary | ICD-10-CM | POA: Diagnosis not present

## 2011-06-01 LAB — CBC WITH DIFFERENTIAL/PLATELET
BASO%: 0.4 % (ref 0.0–2.0)
EOS%: 0 % (ref 0.0–7.0)
LYMPH%: 25.1 % (ref 14.0–49.7)
MCHC: 34 g/dL (ref 31.5–36.0)
MCV: 89.2 fL (ref 79.5–101.0)
MONO%: 10 % (ref 0.0–14.0)
Platelets: 212 10*3/uL (ref 145–400)
RBC: 3.48 10*6/uL — ABNORMAL LOW (ref 3.70–5.45)

## 2011-06-01 MED ORDER — ONDANSETRON 8 MG/50ML IVPB (CHCC)
8.0000 mg | Freq: Once | INTRAVENOUS | Status: AC
  Administered 2011-06-01: 8 mg via INTRAVENOUS

## 2011-06-01 MED ORDER — DEXAMETHASONE SODIUM PHOSPHATE 10 MG/ML IJ SOLN
10.0000 mg | Freq: Once | INTRAMUSCULAR | Status: AC
  Administered 2011-06-01: 10 mg via INTRAVENOUS

## 2011-06-01 MED ORDER — SODIUM CHLORIDE 0.9 % IV SOLN
Freq: Once | INTRAVENOUS | Status: AC
  Administered 2011-06-01: 13:00:00 via INTRAVENOUS

## 2011-06-01 MED ORDER — METHOTREXATE SODIUM CHEMO INJECTION 25 MG/ML
40.0000 mg/m2 | Freq: Once | INTRAMUSCULAR | Status: AC
  Administered 2011-06-01: 75 mg via INTRAVENOUS
  Filled 2011-06-01: qty 3

## 2011-06-01 MED ORDER — SODIUM CHLORIDE 0.9 % IV SOLN
600.0000 mg/m2 | Freq: Once | INTRAVENOUS | Status: AC
  Administered 2011-06-01: 1120 mg via INTRAVENOUS
  Filled 2011-06-01: qty 56

## 2011-06-01 MED ORDER — SODIUM CHLORIDE 0.9 % IJ SOLN
10.0000 mL | INTRAMUSCULAR | Status: DC | PRN
  Administered 2011-06-01: 10 mL
  Filled 2011-06-01: qty 10

## 2011-06-01 MED ORDER — ALTEPLASE 2 MG IJ SOLR
2.0000 mg | Freq: Once | INTRAMUSCULAR | Status: AC | PRN
  Administered 2011-06-01: 2 mg
  Filled 2011-06-01: qty 2

## 2011-06-01 MED ORDER — HEPARIN SOD (PORK) LOCK FLUSH 100 UNIT/ML IV SOLN
500.0000 [IU] | Freq: Once | INTRAVENOUS | Status: AC | PRN
  Administered 2011-06-01: 500 [IU]
  Filled 2011-06-01: qty 5

## 2011-06-01 MED ORDER — FLUOROURACIL CHEMO INJECTION 2.5 GM/50ML
600.0000 mg/m2 | Freq: Once | INTRAVENOUS | Status: AC
  Administered 2011-06-01: 1100 mg via INTRAVENOUS
  Filled 2011-06-01: qty 22

## 2011-06-01 NOTE — Telephone Encounter (Signed)
confrmed  with patient in the chemo room 1:00 06-02-2011 for injection 06-02-2011 printed out calendar and gave to the patinet in the chemo room

## 2011-06-01 NOTE — Progress Notes (Signed)
Hematology and Oncology Follow Up Visit  [name] [id number] [birth year] 51 y.o. 06/01/2011    HPI: [name] is a 51 year old [place] [facility] [place] woman with a locally advanced ER/PR positive at 99/2% respectively, HER-2 positive, cish ratio of 2.87, left breast carcinoma with biopsy-proven lymph node involvement. Initial staging breast MRI on 02/05/2011 showing a mass measuring 4.9 cm with multiple small nodules without fatty hila in the left axilla suspicious for metastatic disease. She completed 2 cycles of every 3 week neoadjuvant Taxotere/carboplatin, due to significant difficulty with the Taxotere component based i.e. significant PPE changes, and chest pain with her second infusion, her treatment course was switched to every 3 week IV CMF/Herceptin. Currently day 1 cycle 3/4 of every 3 week IV CMF, Herceptin held due to worsening left ventricular ejection fraction. 2. History of congestive heart failure, echocardiogram from 01/23 2013 revealing a left ventricular ejection fraction between 50 and 55%. Also on Lasix 120 mg in the a.m., 80 mg in the p.m. HCTZ 25 mg by mouth daily, lisinopril 20 mg by mouth daily, Coreg 25 mg by mouth twice a day 3. Hypothyroidism-on Synthroid 100 mcg daily 4. COPD 5. GERD on Prilosec 20 mg by mouth twice a day 6. Depression on Zoloft 100 mg by mouth daily 7. Reported history of myocardial infarction 7 years ago.  Interim History:   Ms. [name] is seen today with her dear friend and accompaniment prior to day 1 cycle 3/4 every 3 week IV CMF given in the neoadjuvant setting with Herceptin being held due to CHF. She voices no specific complaints today i.e. no fevers, chills, shortness of breath, or chest pain. No nausea, emesis, diarrhea or constipation issues. She tolerated her packed RBC transfusion well. She is awaiting the results of her breast MRI obtained on 05/19/2011. She also had a repeat echocardiogram performed on 05/27/2011. A detailed review of systems is  otherwise noncontributory as noted below.  Review of Systems: Constitutional:  Improved fatigue following packed RBC transfusion. Eyes: excessive tearing ENT: No complaints Cardiovascular: Denies shortness of breath. Respiratory: positive for - shortness of breath Neurological: no TIA or stroke symptoms Dermatological: negative Gastrointestinal: no abdominal pain, change in bowel habits, or black or bloody stools Genito-Urinary: no dysuria, trouble voiding, or hematuria Hematological and Lymphatic: Negative. Breast: negative for breast lumps Patient unable to palpate L breast mass. Musculoskeletal: negative Remaining ROS negative.   Medications:   I have reviewed the patient's current medications.  Current Outpatient Prescriptions  Medication Sig Dispense Refill  . potassium chloride SA (K-DUR,KLOR-CON) 20 MEQ tablet Take 20 mEq by mouth 2 (two) times daily.      . Alum & Mag Hydroxide-Simeth (MAGIC MOUTHWASH W/LIDOCAINE) SOLN Take 5 mLs by mouth 4 (four) times daily as needed.  240 mL  3  . carvedilol (COREG) 25 MG tablet Take 25 mg by mouth 2 (two) times daily with a meal.        . furosemide (LASIX) 40 MG tablet Take 40 mg by mouth daily. 3 in Am  2 in pm       . hydrochlorothiazide (HYDRODIURIL) 25 MG tablet Take 25 mg by mouth daily.        . Ibuprofen-Diphenhydramine HCl (ADVIL PM) 200-25 MG CAPS Take 3 capsules by mouth at bedtime.        [name] levothyroxine (SYNTHROID, LEVOTHROID) 100 MCG tablet Take 100 mcg by mouth daily.        [name] lidocaine-prilocaine (EMLA) cream Apply topically as needed.  30 g  0  . lisinopril (PRINIVIL,ZESTRIL) 20 MG tablet Take 20 mg by mouth daily.        . magnesium oxide (MAG-OX) 400 MG tablet Take 800 mg by mouth daily.        . nicotine (NICODERM CQ - DOSED IN MG/24 HOURS) 21 mg/24hr patch Place 1 patch onto the skin daily.        . nitroGLYCERIN (NITROSTAT) 0.4 MG SL tablet Place 0.4 mg under the tongue every 5 (five) minutes as needed. As needed  for chest pain.      [name] omeprazole (PRILOSEC) 20 MG capsule Take 20 mg by mouth 2 (two) times daily.       . ondansetron (ZOFRAN) 8 MG tablet Take by mouth every 12 (twelve) hours as needed.        [name] OVER THE COUNTER MEDICATION Take 3 capsules by mouth every morning. Green Tea Extract OTC.       [name] oxyCODONE-acetaminophen (PERCOCET) 5-325 MG per tablet Take 1 tablet by mouth 2 (two) times daily as needed. As needed for pain.      [name] prochlorperazine (COMPAZINE) 10 MG tablet Take 1 tablet (10 mg total) by mouth every 6 (six) hours as needed.  30 tablet  3  . sertraline (ZOLOFT) 100 MG tablet Take 100 mg by mouth daily.        [name] tobramycin-dexamethasone (TOBRADEX) ophthalmic solution Place 1 drop into both eyes every 4 (four) hours while awake.        . zolpidem (AMBIEN) 10 MG tablet Take 10 mg by mouth at bedtime.        No current facility-administered medications for this visit.   Facility-Administered Medications Ordered in Other Visits  Medication Dose Route Frequency Provider Last Rate Last Dose  . sodium chloride 0.9 % injection 10 mL  10 mL Intracatheter PRN Lowella Dell, MD        Allergies:  Allergies  Allergen Reactions  . Aspirin Hives  . Docetaxel Other (See Comments)    Chest tightness and pain  . Morphine And Related Hives and Swelling  . Penicillins Hives    Physical Exam: Filed Vitals:   06/01/11 1102  BP: 122/80  Pulse: 88  Temp: 98.7 F (37.1 C)  Weight: 197 lbs. HEENT:  Sclerae Slightly injected but no evidence of any today., conjunctivae pink,  Oropharynx clear.  No frank evidence to suggest oral mucositis or candidiasis.   Nodes:  No cervical, supraclavicular, or axillary lymphadenopathy palpated.  Breast Exam: Deferred. Lungs:  Clear to auscultation bilaterally.  No crackles, rhonchi, or wheezes.   Heart:  Regular rate and rhythm, mild right-sided JVD. Abdomen:  Soft, nontender.  Positive bowel sounds.  No organomegaly or masses palpated.     Musculoskeletal:  No focal spinal tenderness to palpation.  Extremities:  Benign.  No peripheral edema or cyanosis.   Skin:  Benign.   Neuro:  Nonfocal, alert and oriented x 3.   Lab Results: Lab Results  Component Value Date   WBC 5.1 06/01/2011   HGB 10.6* 06/01/2011   HCT 31.1* 06/01/2011   MCV 89.2 06/01/2011   PLT 212 06/01/2011   NEUTROABS 3.3 06/01/2011     Chemistry      Component Value Date/Time   NA 139 05/12/2011 1035   K 3.6 05/12/2011 1035   CL 103 05/12/2011 1035   CO2 30 05/12/2011 1035   BUN 18 05/12/2011 1035   CREATININE 1.06 05/12/2011 1035  Component Value Date/Time   CALCIUM 9.1 05/12/2011 1035   ALKPHOS (Revised) 103 05/12/2011 1035   AST (Revised) 30 05/12/2011 1035   ALT (Revised) 27 05/12/2011 1035   BILITOT (Revised) 0.2* 05/12/2011 1035     Echocardiogram: 05/27/11 Study Conclusions Left ventricle: The cavity size was normal. Wall thickness was increased in a pattern of mild LVH. Systolic function was mildly to moderately reduced. The estimated ejection fraction was in the range of 40% to 45%. Left ventricular diastolic function parameters were normal. Transthoracic echocardiography. M-mode, complete 2D, spectral Doppler, and color Doppler. Height: Height: 147.3cm. Height: 58in. Weight: Weight: 89.4kg. Weight: 196.6lb. Body mass index: BMI: 41.2kg/m^2. Body surface area: BSA: 1.6m^2. Blood pressure: 122/80. Patient status: Outpatient. Location: Echo laboratory. Prepared and Electronically Authenticated by Cassell Clement, MD  Radiographic data: 05/19/11 BILATERAL BREAST MRI WITH AND WITHOUT CONTRAST  Technique: Multiplanar, multisequence MR images of both breasts were obtained prior to and following the intravenous administration of 19ml of Multihance. Three dimensional images were evaluated at the independent DynaCad workstation.  Comparison: 02/05/2011 MR. [facility] indings: Mild normal background  parenchymal enhancement is again  identified. There has been near complete treatment response of the known neoplasm in the outer left breast. Two separate 5 mm ill-defined areas of residual enhancement in the outer left breast, middle third, now exhibit plateau type kinetics and are located slightly inferomedial to the biopsy clip artifact. The enlarged left axillary lymph nodes on the prior study now are  normal in appearance and size. No suspicious enhancing abnormalities are otherwise noted within either breast.  No enlarged or abnormal-appearing lymph nodes are present.  The visualized bony structures and upper liver are unremarkable. IMPRESSION: MR evidence of near complete neoadjuvant treatment response of known left breast cancer and left axillary lymph node metastases. Minimal ill-defined residual enhancement in the area of previous tumor now noted.  No MR evidence of right breast malignancy. Original Report Authenticated By: Rosendo Gros, M.D.    Assessment:  Saphire is a 51 year old [place] [facility] [place] woman with a locally advanced ER/PR positive at 99/2% respectively, HER-2 positive with the cish ratio of 2.87, left breast carcinoma with biopsy-proven lymph node involvement. Initial staging breast MRI on 02/05/2011 showing a mass measuring 4.9 cm with multiple small nodules without fatty hila in the left axilla suspicious for metastatic disease. She completed 2 cycles of every 3 week neoadjuvant Taxotere/carboplatin, due to significant difficulty with the Taxotere component based i.e. significant PPE changes, and chest pain with her second infusion, her treatment course was switched to every 3 week IV CMF/Herceptin. Currently  day 1,cycle 3/4 of every 3 week IV CMF/Herceptin.   2. History of severe afebrile neutropenia despite Neulasta on day 2.  3. History of congestive heart failure, echocardiogram from 01/23 2013 revealing a left ventricular ejection fraction between 50 and 55%.  Also on Lasix 120 mg in the a.m., 80 mg in the p.m. HCTZ 25 mg by mouth daily, lisinopril 20 mg by mouth daily, Coreg 25 mg by mouth twice a day. Worsening left ventricular ejection fraction reported between 40-45%, noted on 05/05/11, stable LVEF on 05/27/11 study.  4. Hypothyroidism-on Synthroid 100 mcg daily 5. COPD  6. GERD on Prilosec 20 mg by mouth twice a day  7. Depression on Zoloft 100 mg by mouth daily  8. History of symptomatic anemia, s/p 2 units of packed RBCs given on 05/21/2011. Resolution of symptoms.  Case to be reviewed with  Dr. Theron Arista.  Plan:  Mrs. Mallari will receive a day 1 cycle 3 of IV CMF with Herceptin being held today as scheduled, she will also receive Neulasta for granulocytes port on day 2. She is requested next week the lab only, which is acceptable, with the patient understanding to contact us prior to her week lab appointment if the need should arise. Otherwise, I will see her back in 3 weeks time prior to her fourth cycle of IV CMF being given in the neoadjuvant setting which should complete out her neoadjuvant therapy. She knows to contact us prior to this three-week followup if the need should arise.   This plan was reviewed with the patient, who voices understanding and agreement.  She knows to call with any changes or problems.    Arryanna Holquin T, PA-C 06/01/2011

## 2011-06-02 ENCOUNTER — Ambulatory Visit (HOSPITAL_BASED_OUTPATIENT_CLINIC_OR_DEPARTMENT_OTHER): Payer: Medicare Other

## 2011-06-02 VITALS — BP 133/87 | HR 77 | Temp 97.3°F

## 2011-06-02 DIAGNOSIS — C50419 Malignant neoplasm of upper-outer quadrant of unspecified female breast: Secondary | ICD-10-CM

## 2011-06-02 DIAGNOSIS — C50919 Malignant neoplasm of unspecified site of unspecified female breast: Secondary | ICD-10-CM

## 2011-06-02 MED ORDER — PEGFILGRASTIM INJECTION 6 MG/0.6ML
6.0000 mg | Freq: Once | SUBCUTANEOUS | Status: AC
  Administered 2011-06-02: 6 mg via SUBCUTANEOUS
  Filled 2011-06-02: qty 0.6

## 2011-06-08 ENCOUNTER — Ambulatory Visit: Payer: Medicare Other

## 2011-06-08 ENCOUNTER — Other Ambulatory Visit: Payer: Self-pay | Admitting: Oncology

## 2011-06-08 ENCOUNTER — Other Ambulatory Visit: Payer: Self-pay | Admitting: *Deleted

## 2011-06-08 ENCOUNTER — Other Ambulatory Visit (HOSPITAL_BASED_OUTPATIENT_CLINIC_OR_DEPARTMENT_OTHER): Payer: Medicare Other | Admitting: Lab

## 2011-06-08 ENCOUNTER — Telehealth: Payer: Self-pay | Admitting: *Deleted

## 2011-06-08 ENCOUNTER — Ambulatory Visit: Payer: Medicare Other | Admitting: Physician Assistant

## 2011-06-08 DIAGNOSIS — C50919 Malignant neoplasm of unspecified site of unspecified female breast: Secondary | ICD-10-CM

## 2011-06-08 DIAGNOSIS — D649 Anemia, unspecified: Secondary | ICD-10-CM

## 2011-06-08 LAB — CBC WITH DIFFERENTIAL/PLATELET
Eosinophils Absolute: 0 10*3/uL (ref 0.0–0.5)
MONO#: 0.2 10*3/uL (ref 0.1–0.9)
MONO%: 12.3 % (ref 0.0–14.0)
NEUT#: 0.4 10*3/uL — CL (ref 1.5–6.5)
RBC: 3.28 10*6/uL — ABNORMAL LOW (ref 3.70–5.45)
RDW: 16.6 % — ABNORMAL HIGH (ref 11.2–14.5)
WBC: 1.5 10*3/uL — ABNORMAL LOW (ref 3.9–10.3)
lymph#: 0.8 10*3/uL — ABNORMAL LOW (ref 0.9–3.3)

## 2011-06-08 LAB — COMPREHENSIVE METABOLIC PANEL
ALT: 14 U/L (ref 0–35)
CO2: 26 mEq/L (ref 19–32)
Chloride: 103 mEq/L (ref 96–112)
Sodium: 139 mEq/L (ref 135–145)
Total Bilirubin: 0.4 mg/dL (ref 0.3–1.2)
Total Protein: 6.9 g/dL (ref 6.0–8.3)

## 2011-06-08 LAB — HOLD TUBE, BLOOD BANK

## 2011-06-08 LAB — SAMPLE TO BLOOD BANK

## 2011-06-08 NOTE — Telephone Encounter (Signed)
Called pt. To let her know that her white count is low and reviewed neutropenic precautions with her. She is fine at the present.  She verbalized when to call us and knows when her return appts. Are.

## 2011-06-08 NOTE — Telephone Encounter (Signed)
Called pt. And let her know that Dr. Donnie Coffin wants her on an antibiotic due to her low WBC.  He wants her to take levaquin and has already called it in to her pharmacy for her to pick up.  If she has any problems getting this, please let us know.

## 2011-06-15 ENCOUNTER — Ambulatory Visit (HOSPITAL_COMMUNITY)
Admission: RE | Admit: 2011-06-15 | Discharge: 2011-06-15 | Disposition: A | Discharge status: Home / Self Care | Payer: Medicare Other | Source: Ambulatory Visit | Attending: Oncology | Admitting: Oncology

## 2011-06-15 DIAGNOSIS — I428 Other cardiomyopathies: Secondary | ICD-10-CM | POA: Diagnosis not present

## 2011-06-15 DIAGNOSIS — I10 Essential (primary) hypertension: Secondary | ICD-10-CM | POA: Insufficient documentation

## 2011-06-15 DIAGNOSIS — C119 Malignant neoplasm of nasopharynx, unspecified: Secondary | ICD-10-CM | POA: Insufficient documentation

## 2011-06-15 DIAGNOSIS — I509 Heart failure, unspecified: Secondary | ICD-10-CM

## 2011-06-16 ENCOUNTER — Ambulatory Visit: Payer: Medicare Other | Admitting: Physician Assistant

## 2011-06-16 DIAGNOSIS — I428 Other cardiomyopathies: Secondary | ICD-10-CM | POA: Diagnosis not present

## 2011-06-21 ENCOUNTER — Telehealth: Payer: Self-pay | Admitting: Oncology

## 2011-06-21 ENCOUNTER — Other Ambulatory Visit: Payer: Self-pay | Admitting: Oncology

## 2011-06-21 NOTE — Telephone Encounter (Signed)
No call was made.

## 2011-06-22 ENCOUNTER — Encounter: Payer: Self-pay | Admitting: Physician Assistant

## 2011-06-22 ENCOUNTER — Ambulatory Visit: Payer: Medicare Other | Admitting: Physician Assistant

## 2011-06-22 ENCOUNTER — Ambulatory Visit (HOSPITAL_BASED_OUTPATIENT_CLINIC_OR_DEPARTMENT_OTHER): Payer: Medicare Other

## 2011-06-22 ENCOUNTER — Ambulatory Visit (HOSPITAL_BASED_OUTPATIENT_CLINIC_OR_DEPARTMENT_OTHER): Payer: Medicare Other | Admitting: Physician Assistant

## 2011-06-22 ENCOUNTER — Telehealth: Payer: Self-pay | Admitting: *Deleted

## 2011-06-22 ENCOUNTER — Other Ambulatory Visit (HOSPITAL_BASED_OUTPATIENT_CLINIC_OR_DEPARTMENT_OTHER): Payer: Medicare Other | Admitting: Lab

## 2011-06-22 VITALS — BP 121/71 | HR 83 | Temp 97.9°F | Ht <= 58 in | Wt 196.6 lb

## 2011-06-22 DIAGNOSIS — C50919 Malignant neoplasm of unspecified site of unspecified female breast: Secondary | ICD-10-CM

## 2011-06-22 DIAGNOSIS — Z5111 Encounter for antineoplastic chemotherapy: Secondary | ICD-10-CM

## 2011-06-22 DIAGNOSIS — C50419 Malignant neoplasm of upper-outer quadrant of unspecified female breast: Secondary | ICD-10-CM

## 2011-06-22 DIAGNOSIS — Z170 Estrogen receptor positive status [ER+]: Secondary | ICD-10-CM

## 2011-06-22 LAB — COMPREHENSIVE METABOLIC PANEL
ALT: 15 U/L (ref 0–35)
AST: 21 U/L (ref 0–37)
CO2: 27 mEq/L (ref 19–32)
Calcium: 9.6 mg/dL (ref 8.4–10.5)
Chloride: 105 mEq/L (ref 96–112)
Creatinine, Ser: 1.27 mg/dL — ABNORMAL HIGH (ref 0.50–1.10)
Sodium: 141 mEq/L (ref 135–145)
Total Bilirubin: 0.4 mg/dL (ref 0.3–1.2)
Total Protein: 7 g/dL (ref 6.0–8.3)

## 2011-06-22 LAB — CBC WITH DIFFERENTIAL/PLATELET
BASO%: 0.2 % (ref 0.0–2.0)
Eosinophils Absolute: 0 10*3/uL (ref 0.0–0.5)
MCHC: 33 g/dL (ref 31.5–36.0)
MONO#: 0.4 10*3/uL (ref 0.1–0.9)
NEUT#: 2.6 10*3/uL (ref 1.5–6.5)
RBC: 2.93 10*6/uL — ABNORMAL LOW (ref 3.70–5.45)
RDW: 19.2 % — ABNORMAL HIGH (ref 11.2–14.5)
WBC: 4.1 10*3/uL (ref 3.9–10.3)
nRBC: 0 % (ref 0–0)

## 2011-06-22 LAB — LACTATE DEHYDROGENASE: LDH: 177 U/L (ref 94–250)

## 2011-06-22 MED ORDER — SODIUM CHLORIDE 0.9 % IJ SOLN
10.0000 mL | INTRAMUSCULAR | Status: DC | PRN
  Administered 2011-06-22: 10 mL
  Filled 2011-06-22: qty 10

## 2011-06-22 MED ORDER — METHOTREXATE SODIUM CHEMO INJECTION 25 MG/ML
40.0000 mg/m2 | Freq: Once | INTRAMUSCULAR | Status: AC
  Administered 2011-06-22: 75 mg via INTRAVENOUS
  Filled 2011-06-22: qty 3

## 2011-06-22 MED ORDER — FLUOROURACIL CHEMO INJECTION 2.5 GM/50ML
600.0000 mg/m2 | Freq: Once | INTRAVENOUS | Status: AC
  Administered 2011-06-22: 1100 mg via INTRAVENOUS
  Filled 2011-06-22: qty 22

## 2011-06-22 MED ORDER — DEXAMETHASONE SODIUM PHOSPHATE 10 MG/ML IJ SOLN
10.0000 mg | Freq: Once | INTRAMUSCULAR | Status: AC
Start: 2011-06-22 — End: 2011-06-22
  Administered 2011-06-22: 10 mg via INTRAVENOUS

## 2011-06-22 MED ORDER — PEGFILGRASTIM INJECTION 6 MG/0.6ML
6.0000 mg | Freq: Once | SUBCUTANEOUS | Status: DC
  Filled 2011-06-22: qty 0.6

## 2011-06-22 MED ORDER — HEPARIN SOD (PORK) LOCK FLUSH 100 UNIT/ML IV SOLN
500.0000 [IU] | Freq: Once | INTRAVENOUS | Status: AC | PRN
  Administered 2011-06-22: 500 [IU]
  Filled 2011-06-22: qty 5

## 2011-06-22 MED ORDER — SODIUM CHLORIDE 0.9 % IV SOLN
Freq: Once | INTRAVENOUS | Status: AC
  Administered 2011-06-22: 13:00:00 via INTRAVENOUS

## 2011-06-22 MED ORDER — SODIUM CHLORIDE 0.9 % IV SOLN
600.0000 mg/m2 | Freq: Once | INTRAVENOUS | Status: AC
  Administered 2011-06-22: 1120 mg via INTRAVENOUS
  Filled 2011-06-22: qty 56

## 2011-06-22 MED ORDER — ONDANSETRON 8 MG/50ML IVPB (CHCC)
8.0000 mg | Freq: Once | INTRAVENOUS | Status: AC
  Administered 2011-06-22: 8 mg via INTRAVENOUS

## 2011-06-22 NOTE — Progress Notes (Signed)
Hematology and Oncology Follow Up Visit  TAMBRA MULLER [id number] 1961/03/17 51 y.o. 06/22/2011    HPI: Khianna is a 51 year old [place] [facility] [place] woman with a locally advanced ER/PR positive at 99/2% respectively, HER-2 positive, cish ratio of 2.87, left breast carcinoma with biopsy-proven lymph node involvement. Initial staging breast MRI on 02/05/2011 showing a mass measuring 4.9 cm with multiple small nodules without fatty hila in the left axilla suspicious for metastatic disease. She completed 2 cycles of every 3 week neoadjuvant Taxotere/carboplatin, due to significant difficulty with the Taxotere component based i.e. significant PPE changes, and chest pain with her second infusion, her treatment course was switched to every 3 week IV CMF/Herceptin. Currently day 1 cycle 3/4 of every 3 week IV CMF, Herceptin held due to worsening left ventricular ejection fraction. 2. History of congestive heart failure, echocardiogram from 01/23 2013 revealing a left ventricular ejection fraction between 50 and 55%. Also on Lasix 120 mg in the a.m., 80 mg in the p.m. HCTZ 25 mg by mouth daily, lisinopril 20 mg by mouth daily, Coreg 25 mg by mouth twice a day 3. Hypothyroidism-on Synthroid 100 mcg daily 4. COPD 5. GERD on Prilosec 20 mg by mouth twice a day 6. Depression on Zoloft 100 mg by mouth daily 7. Reported history of myocardial infarction 7 years ago.  Interim History:   Ms. Winningham is seen today with her dear friend and accompaniment prior to day 1 cycle 4/4 every 3 week IV CMF given in the neoadjuvant setting with Herceptin being held due to CHF. She voices no specific complaints today i.e. no fevers, chills, shortness of breath, or chest pain. No nausea, emesis, diarrhea or constipation issues. She does describe some decreased range of motion of her left shoulder region, this is not chronic in nature. She denies any left arm lymphedema. She has had a repeat 2-D echocardiogram performed on 06/15/2011.  Overall she is feeling quite well, and is anxious to get her last cycle of treatment completed today.  A detailed review of systems is otherwise noncontributory as noted below.  Review of Systems: Constitutional:  Improved fatigue following packed RBC transfusion. Eyes: excessive tearing ENT: No complaints Cardiovascular: Denies shortness of breath. Respiratory: positive for - shortness of breath Neurological: no TIA or stroke symptoms Dermatological: negative Gastrointestinal: no abdominal pain, change in bowel habits, or black or bloody stools Genito-Urinary: no dysuria, trouble voiding, or hematuria Hematological and Lymphatic: Negative. Breast: negative for breast lumps Patient unable to palpate L breast mass. Musculoskeletal: negative Remaining ROS negative.   Medications:   I have reviewed the patient's current medications.  Current Outpatient Prescriptions  Medication Sig Dispense Refill  . Alum & Mag Hydroxide-Simeth (MAGIC MOUTHWASH W/LIDOCAINE) SOLN Take 5 mLs by mouth 4 (four) times daily as needed.  240 mL  3  . carvedilol (COREG) 25 MG tablet Take 25 mg by mouth 2 (two) times daily with a meal.        . furosemide (LASIX) 40 MG tablet Take 40 mg by mouth daily. 3 in Am  2 in pm       . hydrochlorothiazide (HYDRODIURIL) 25 MG tablet Take 25 mg by mouth daily.        . Ibuprofen-Diphenhydramine HCl (ADVIL PM) 200-25 MG CAPS Take 3 capsules by mouth at bedtime.        [name] levothyroxine (SYNTHROID, LEVOTHROID) 100 MCG tablet Take 100 mcg by mouth daily.        [name] lidocaine-prilocaine (EMLA) cream Apply  topically as needed.  30 g  0  . lisinopril (PRINIVIL,ZESTRIL) 20 MG tablet Take 20 mg by mouth daily.        . magnesium oxide (MAG-OX) 400 MG tablet Take 800 mg by mouth daily.        . nicotine (NICODERM CQ - DOSED IN MG/24 HOURS) 21 mg/24hr patch Place 1 patch onto the skin daily.        . nitroGLYCERIN (NITROSTAT) 0.4 MG SL tablet Place 0.4 mg under the tongue every 5  (five) minutes as needed. As needed for chest pain.      [name] omeprazole (PRILOSEC) 20 MG capsule Take 20 mg by mouth 2 (two) times daily.       . ondansetron (ZOFRAN) 8 MG tablet Take by mouth every 12 (twelve) hours as needed.        [name] OVER THE COUNTER MEDICATION Take 3 capsules by mouth every morning. Green Tea Extract OTC.       [name] oxyCODONE-acetaminophen (PERCOCET) 5-325 MG per tablet Take 1 tablet by mouth 2 (two) times daily as needed. As needed for pain.      . potassium chloride SA (K-DUR,KLOR-CON) 20 MEQ tablet Take 20 mEq by mouth 2 (two) times daily.      . prochlorperazine (COMPAZINE) 10 MG tablet Take 1 tablet (10 mg total) by mouth every 6 (six) hours as needed.  30 tablet  3  . sertraline (ZOLOFT) 100 MG tablet Take 100 mg by mouth daily.        [name] tobramycin-dexamethasone (TOBRADEX) ophthalmic solution Place 1 drop into both eyes every 4 (four) hours while awake.        . zolpidem (AMBIEN) 10 MG tablet Take 10 mg by mouth at bedtime.        Current Facility-Administered Medications  Medication Dose Route Frequency Provider Last Rate Last Dose  . DISCONTD: pegfilgrastim (NEULASTA) injection 6 mg  6 mg Subcutaneous Once Amada Kingfisher, PA       Facility-Administered Medications Ordered in Other Visits  Medication Dose Route Frequency Provider Last Rate Last Dose  . 0.9 %  sodium chloride infusion   Intravenous Once Amada Kingfisher, PA 20 mL/hr at 06/22/11 1253    . cyclophosphamide (CYTOXAN) 1,120 mg in sodium chloride 0.9 % 250 mL chemo infusion  600 mg/m2 (Treatment Plan Actual) Intravenous Once Amada Kingfisher, PA   1,120 mg at 06/22/11 1342  . dexamethasone (DECADRON) injection 10 mg  10 mg Intravenous Once Amada Kingfisher, PA   10 mg at 06/22/11 1255  . fluorouracil (ADRUCIL) chemo injection 1,100 mg  600 mg/m2 (Treatment Plan Actual) Intravenous Once Amada Kingfisher, PA   1,100 mg at 06/22/11 1342  . heparin lock flush 100 unit/mL  500 Units Intracatheter  Once PRN Amada Kingfisher, PA   500 Units at 06/22/11 1429  . methotrexate chemo injection 75 mg  40 mg/m2 (Treatment Plan Actual) Intravenous Once Amada Kingfisher, PA   75 mg at 06/22/11 1336  . ondansetron (ZOFRAN) IVPB 8 mg  8 mg Intravenous Once Amada Kingfisher, PA   8 mg at 06/22/11 1255  . sodium chloride 0.9 % injection 10 mL  10 mL Intracatheter PRN Lowella Dell, MD      . sodium chloride 0.9 % injection 10 mL  10 mL Intracatheter PRN Amada Kingfisher, PA   10 mL at 06/22/11 1429    Allergies:  Allergies  Allergen Reactions  . Aspirin Hives  .  Docetaxel Other (See Comments)    Chest tightness and pain  . Morphine And Related Hives and Swelling  . Penicillins Hives    Physical Exam: Filed Vitals:   06/22/11 1130  BP: 121/71  Pulse: 83  Temp: 97.9 F (36.6 C)  Weight: 196 lbs. HEENT:  Sclerae Slightly injected but no evidence of any today., conjunctivae pink,  Oropharynx clear.  No frank evidence to suggest oral mucositis or candidiasis.   Nodes:  No cervical, supraclavicular, or axillary lymphadenopathy palpated.  Breast Exam:  the left breast was examined, there is no evidence of obvious definable mass effect, no nipple inversion or discharge, and the axilla is clear.   Lungs: Clear to auscultation bilaterally.  No crackles, rhonchi, or wheezes.   Heart:  Regular rate and rhythm, mild right-sided JVD. Abdomen:  Soft, nontender.  Positive bowel sounds.  No organomegaly or masses palpated.   Musculoskeletal:  No focal spinal tenderness to palpation, she does have some tenderness over her left shoulder region, no evidence of any palpable cords erythema or lymphedema.  Extremities:  Benign.  No peripheral edema or cyanosis.   Skin:  Benign.   Neuro:  Nonfocal, alert and oriented x 3.   Lab Results: Lab Results  Component Value Date   WBC 4.1 06/22/2011   HGB 9.0* 06/22/2011   HCT 27.2* 06/22/2011   MCV 92.9 06/22/2011   PLT 147 06/22/2011   NEUTROABS 2.6  06/22/2011     Chemistry      Component Value Date/Time   NA 139 06/08/2011 1027   K 4.0 06/08/2011 1027   CL 103 06/08/2011 1027   CO2 26 06/08/2011 1027   BUN 23 06/08/2011 1027   CREATININE 1.13* 06/08/2011 1027      Component Value Date/Time   CALCIUM 9.3 06/08/2011 1027   ALKPHOS 102 06/08/2011 1027   AST 11 06/08/2011 1027   ALT 14 06/08/2011 1027   BILITOT 0.4 06/08/2011 1027     Echocardiogram: 06/15/11 Study Conclusions - Left ventricle: The cavity size was normal. Systolic function was moderately reduced. The estimated ejection fraction was in the range of 35% to 40%. Diffuse hypokinesis. - Ventricular septum: Septal motion showed abnormal function and dyssynergy. Transthoracic echocardiography. M-mode, complete 2D, spectral Doppler, and color Doppler. Patient status: Inpatient. Location: Echo laboratory. Prepared and Electronically Authenticated by Everette Rank, MD     Assessment:  Izora is a 51 year old [place] [facility] [place] woman with a locally advanced ER/PR positive at 99/2% respectively, HER-2 positive with the cish ratio of 2.87, left breast carcinoma with biopsy-proven lymph node involvement. Initial staging breast MRI on 02/05/2011 showing a mass measuring 4.9 cm with multiple small nodules without fatty hila in the left axilla suspicious for metastatic disease. She completed 2 cycles of every 3 week neoadjuvant Taxotere/carboplatin, due to significant difficulty with the Taxotere component based i.e. significant PPE changes, and chest pain with her second infusion, her treatment course was switched to every 3 week IV CMF/Herceptin. Currently  day 1,cycle 4/4 of every 3 week IV CMF/Herceptin.   2. History of severe afebrile neutropenia despite Neulasta on day 2.  3. History of congestive heart failure, echocardiogram from 01/23 2013 revealing a left ventricular ejection fraction between 50 and 55%. Also on Lasix 120 mg in the a.m., 80 mg in the p.m. HCTZ 25 mg by  mouth daily, lisinopril 20 mg by mouth daily, Coreg 25 mg by mouth twice a day. Worsening left ventricular ejection fraction reported between 35-40% on  echocardiogram obtained on 06/15/2011. Patient is clinically asymptomatic.  4. Hypothyroidism-on Synthroid 100 mcg daily 5. COPD  6. GERD on Prilosec 20 mg by mouth twice a day  7. Depression on Zoloft 100 mg by mouth daily  8. History of symptomatic anemia, s/p 2 units of packed RBCs given on 05/21/2011. Resolution of symptoms.  Case has been reviewed with Dr. Darnelle Catalan in Dr. Theron Arista Rubin's absence.  Plan:  We will proceed with chemotherapy today as scheduled. Therefore, Mrs. Mandarino will receive a day 1 cycle 4/4 of IV CMF with Herceptin continuing to be held , she will also receive Neulasta for granulocyte support on day 2. We will schedule her for bilateral breast MRI since she has completed her neoadjuvant therapy, along with a followup in our office on 06/29/2011 for both nadir assessment and to go over the MRI results. In the interim, we'll also refer her back to Dr. Rayburn Ma for definitive surgical planning. This plan was reviewed with the patient, who voices understanding and agreement.  She knows to call with any changes or problems.    Holy Battenfield T, PA-C 06/22/2011

## 2011-06-22 NOTE — Patient Instructions (Signed)
[facility] Discharge Instructions for Patients Receiving Chemotherapy  Today you received the following chemotherapy agents Cytoxan/92fu/mtx To help prevent nausea and vomiting after your treatment, we encourage you to take your nausea medication  Per Dr.  Donnie Coffin If you develop nausea and vomiting that is not controlled by your nausea medication, call the clinic. If it is after clinic hours your family physician or the after hours number for the clinic or go to the Emergency Department.   BELOW ARE SYMPTOMS THAT SHOULD BE REPORTED IMMEDIATELY:  *FEVER GREATER THAN 100.5 F  *CHILLS WITH OR WITHOUT FEVER  NAUSEA AND VOMITING THAT IS NOT CONTROLLED WITH YOUR NAUSEA MEDICATION  *UNUSUAL SHORTNESS OF BREATH  *UNUSUAL BRUISING OR BLEEDING  TENDERNESS IN MOUTH AND THROAT WITH OR WITHOUT PRESENCE OF ULCERS  *URINARY PROBLEMS  *BOWEL PROBLEMS  UNUSUAL RASH Items with * indicate a potential emergency and should be followed up as soon as possible.   Feel free to call the clinic you have any questions or concerns. The clinic phone number is [phone].   I have been informed and understand all the instructions given to me. I know to contact the clinic, my physician, or go to the Emergency Department if any problems should occur. I do not have any questions at this time, but understand that I may call the clinic during office hours   should I have any questions or need assistance in obtaining follow up care.    __________________________________________  _____________  __________ Signature of Patient or Authorized Representative            Date                   Time    __________________________________________ Nurse's Signature

## 2011-06-22 NOTE — Telephone Encounter (Signed)
made patient appointment for mri of the breast on 06-29-2011 at 3:00pm made patient appointment for blackman on 06-29-2011 at 9:50am appointment on 06-29-2011 starting at 12:45pm printed out calendar and gave to the patient in the chemo room

## 2011-06-22 NOTE — Telephone Encounter (Signed)
gave patient appointments fo blackman on 06-29-2011 mri of the breast on 06-29-2011 and labs and schere

## 2011-06-23 ENCOUNTER — Ambulatory Visit (HOSPITAL_BASED_OUTPATIENT_CLINIC_OR_DEPARTMENT_OTHER): Payer: Medicare Other

## 2011-06-23 VITALS — BP 119/77 | HR 75 | Temp 97.8°F

## 2011-06-23 DIAGNOSIS — Z5189 Encounter for other specified aftercare: Secondary | ICD-10-CM

## 2011-06-23 DIAGNOSIS — C50919 Malignant neoplasm of unspecified site of unspecified female breast: Secondary | ICD-10-CM | POA: Diagnosis not present

## 2011-06-23 MED ORDER — PEGFILGRASTIM INJECTION 6 MG/0.6ML
6.0000 mg | Freq: Once | SUBCUTANEOUS | Status: AC
  Administered 2011-06-23: 6 mg via SUBCUTANEOUS
  Filled 2011-06-23: qty 0.6

## 2011-06-24 ENCOUNTER — Encounter (INDEPENDENT_AMBULATORY_CARE_PROVIDER_SITE_OTHER): Payer: Self-pay | Admitting: Surgery

## 2011-06-28 ENCOUNTER — Other Ambulatory Visit: Payer: Self-pay | Admitting: Physician Assistant

## 2011-06-28 DIAGNOSIS — C50919 Malignant neoplasm of unspecified site of unspecified female breast: Secondary | ICD-10-CM

## 2011-06-29 ENCOUNTER — Other Ambulatory Visit: Payer: Self-pay | Admitting: *Deleted

## 2011-06-29 ENCOUNTER — Other Ambulatory Visit (HOSPITAL_BASED_OUTPATIENT_CLINIC_OR_DEPARTMENT_OTHER): Payer: Medicare Other | Admitting: Lab

## 2011-06-29 ENCOUNTER — Encounter (INDEPENDENT_AMBULATORY_CARE_PROVIDER_SITE_OTHER): Payer: Self-pay | Admitting: Surgery

## 2011-06-29 ENCOUNTER — Ambulatory Visit (INDEPENDENT_AMBULATORY_CARE_PROVIDER_SITE_OTHER): Payer: Medicare Other | Admitting: Surgery

## 2011-06-29 ENCOUNTER — Ambulatory Visit (HOSPITAL_BASED_OUTPATIENT_CLINIC_OR_DEPARTMENT_OTHER): Payer: Medicare Other | Admitting: Physician Assistant

## 2011-06-29 ENCOUNTER — Ambulatory Visit: Payer: Medicare Other | Admitting: Physician Assistant

## 2011-06-29 ENCOUNTER — Ambulatory Visit (HOSPITAL_COMMUNITY)
Admission: RE | Admit: 2011-06-29 | Discharge: 2011-06-29 | Disposition: A | Discharge status: Home / Self Care | Payer: Medicare Other | Source: Ambulatory Visit | Attending: Physician Assistant | Admitting: Physician Assistant

## 2011-06-29 ENCOUNTER — Other Ambulatory Visit (INDEPENDENT_AMBULATORY_CARE_PROVIDER_SITE_OTHER): Payer: Self-pay | Admitting: Surgery

## 2011-06-29 VITALS — BP 101/62 | HR 98 | Temp 98.6°F | Ht <= 58 in | Wt 197.5 lb

## 2011-06-29 VITALS — BP 98/54 | HR 68 | Temp 97.0°F | Resp 16 | Ht <= 58 in | Wt 195.6 lb

## 2011-06-29 DIAGNOSIS — C773 Secondary and unspecified malignant neoplasm of axilla and upper limb lymph nodes: Secondary | ICD-10-CM

## 2011-06-29 DIAGNOSIS — C50912 Malignant neoplasm of unspecified site of left female breast: Secondary | ICD-10-CM

## 2011-06-29 DIAGNOSIS — F329 Major depressive disorder, single episode, unspecified: Secondary | ICD-10-CM

## 2011-06-29 DIAGNOSIS — D702 Other drug-induced agranulocytosis: Secondary | ICD-10-CM | POA: Diagnosis not present

## 2011-06-29 DIAGNOSIS — C50919 Malignant neoplasm of unspecified site of unspecified female breast: Secondary | ICD-10-CM

## 2011-06-29 DIAGNOSIS — R599 Enlarged lymph nodes, unspecified: Secondary | ICD-10-CM | POA: Insufficient documentation

## 2011-06-29 DIAGNOSIS — Z09 Encounter for follow-up examination after completed treatment for conditions other than malignant neoplasm: Secondary | ICD-10-CM | POA: Diagnosis not present

## 2011-06-29 LAB — CBC WITH DIFFERENTIAL/PLATELET
BASO%: 0.4 % (ref 0.0–2.0)
Basophils Absolute: 0 10*3/uL (ref 0.0–0.1)
EOS%: 0.3 % (ref 0.0–7.0)
HCT: 23.6 % — ABNORMAL LOW (ref 34.8–46.6)
HGB: 8 g/dL — ABNORMAL LOW (ref 11.6–15.9)
MCH: 32.1 pg (ref 25.1–34.0)
MCHC: 33.9 g/dL (ref 31.5–36.0)
MCV: 94.8 fL (ref 79.5–101.0)
MONO%: 19.3 % — ABNORMAL HIGH (ref 0.0–14.0)
NEUT%: 15.8 % — ABNORMAL LOW (ref 38.4–76.8)
lymph#: 0.8 10*3/uL — ABNORMAL LOW (ref 0.9–3.3)

## 2011-06-29 MED ORDER — MAGIC MOUTHWASH W/LIDOCAINE: 15.0000 mL | Freq: Four times a day (QID) | ORAL | Status: DC | PRN

## 2011-06-29 MED ORDER — CIPROFLOXACIN HCL 500 MG PO TABS: 500.0000 mg | ORAL_TABLET | Freq: Two times a day (BID) | ORAL | Status: DC

## 2011-06-29 MED ORDER — GADOBENATE DIMEGLUMINE 529 MG/ML IV SOLN
18.0000 mL | Freq: Once | INTRAVENOUS | Status: AC | PRN
  Administered 2011-06-29: 18 mL via INTRAVENOUS

## 2011-06-29 MED ORDER — SERTRALINE HCL 50 MG PO TABS: 50.0000 mg | ORAL_TABLET | Freq: Every day | ORAL | Status: DC

## 2011-06-29 NOTE — Progress Notes (Signed)
Patient ID: [name], female   DOB: 09-27-1960, 51 y.o.   MRN: [id number]  Chief Complaint  Patient presents with  . Follow-up    left breast cancer    HPI [name] is a 51 y.o. female.  Left breast cancer HPI She is here for followup of her left breast cancer. She is completing her chemotherapy. She has had an excellent response. Her MRI in February showed almost a complete response to chemotherapy with resolution of the mass in the axillary adenopathy. She'll be having another MRI later today. She is complaining of some left shoulder pain today. She has no pain regarding her breast and denies nipple discharge Past Medical History  Diagnosis Date  . Depression   . Hypothyroidism   . Sleep apnea   . Congestive heart failure   . Emphysema of lung   . GERD (gastroesophageal reflux disease)   . Heart murmur   . Hypertension   . Breast cancer     left breast  . Anemia 05/19/2011    Past Surgical History  Procedure Date  . Abdominal hysterectomy   . [place]-   . Slapingooophorectomy, right remote  . Portacath placement 02/26/2011    Procedure: INSERTION PORT-A-CATH;  Surgeon: Shelly Rubenstein, MD;  Location[facility] OR;  Service: General;  Laterality: Right;  . Breast biopsy     left breast    Family History  Problem Relation Age of Onset  . Leukemia Mother   . Heart disease Maternal Grandmother     Social History History  Substance Use Topics  . Smoking status: Former Smoker -- 1.0 packs/day    Quit date: 02/20/2011  . Smokeless tobacco: Never Used  . Alcohol Use: 0.0 oz/week    5-7 Glasses of wine per week    Allergies  Allergen Reactions  . Aspirin Hives  . Docetaxel Other (See Comments)    Chest tightness and pain  . Morphine And Related Hives and Swelling  . Penicillins Hives    Current Outpatient Prescriptions  Medication Sig Dispense Refill  . Alum & Mag Hydroxide-Simeth (MAGIC MOUTHWASH W/LIDOCAINE) SOLN Take 5 mLs by mouth 4 (four) times daily as  needed.  240 mL  3  . carvedilol (COREG) 25 MG tablet Take 25 mg by mouth 2 (two) times daily with a meal.        . furosemide (LASIX) 40 MG tablet Take 40 mg by mouth daily. 3 in Am  2 in pm       . hydrochlorothiazide (HYDRODIURIL) 25 MG tablet Take 25 mg by mouth daily.        . Ibuprofen-Diphenhydramine HCl (ADVIL PM) 200-25 MG CAPS Take 3 capsules by mouth at bedtime.        [name] levothyroxine (SYNTHROID, LEVOTHROID) 100 MCG tablet Take 100 mcg by mouth daily.        [name] lidocaine-prilocaine (EMLA) cream Apply topically as needed.  30 g  0  . lisinopril (PRINIVIL,ZESTRIL) 20 MG tablet Take 20 mg by mouth daily.        . magnesium oxide (MAG-OX) 400 MG tablet Take 800 mg by mouth daily.        . nicotine (NICODERM CQ - DOSED IN MG/24 HOURS) 21 mg/24hr patch Place 1 patch onto the skin daily.        . nitroGLYCERIN (NITROSTAT) 0.4 MG SL tablet Place 0.4 mg under the tongue every 5 (five) minutes as needed. As needed for chest pain.      [name]  omeprazole (PRILOSEC) 20 MG capsule Take 20 mg by mouth 2 (two) times daily.       . ondansetron (ZOFRAN) 8 MG tablet Take by mouth every 12 (twelve) hours as needed.        [name] OVER THE COUNTER MEDICATION Take 3 capsules by mouth every morning. Green Tea Extract OTC.       [name] oxyCODONE-acetaminophen (PERCOCET) 5-325 MG per tablet Take 1 tablet by mouth 2 (two) times daily as needed. As needed for pain.      . potassium chloride SA (K-DUR,KLOR-CON) 20 MEQ tablet Take 20 mEq by mouth 2 (two) times daily.      . prochlorperazine (COMPAZINE) 10 MG tablet Take 1 tablet (10 mg total) by mouth every 6 (six) hours as needed.  30 tablet  3  . sertraline (ZOLOFT) 100 MG tablet Take 100 mg by mouth daily.        [name] tobramycin-dexamethasone (TOBRADEX) ophthalmic solution Place 1 drop into both eyes every 4 (four) hours while awake.        . zolpidem (AMBIEN) 10 MG tablet Take 10 mg by mouth at bedtime.        No current facility-administered medications for this visit.    Facility-Administered Medications Ordered in Other Visits  Medication Dose Route Frequency Provider Last Rate Last Dose  . sodium chloride 0.9 % injection 10 mL  10 mL Intracatheter PRN Lowella Dell, MD        Review of Systems Review of Systems  Constitutional: Negative for fever, chills and unexpected weight change.  HENT: Negative for hearing loss, congestion, sore throat, trouble swallowing and voice change.   Eyes: Negative for visual disturbance.  Respiratory: Negative for cough and wheezing.   Cardiovascular: Negative for chest pain, palpitations and leg swelling.  Gastrointestinal: Negative for nausea, vomiting, abdominal pain, diarrhea, constipation, blood in stool, abdominal distention and anal bleeding.  Genitourinary: Negative for hematuria, vaginal bleeding and difficulty urinating.  Musculoskeletal: Negative for arthralgias.  Skin: Negative for rash and wound.  Neurological: Negative for seizures, syncope and headaches.  Hematological: Negative for adenopathy. Does not bruise/bleed easily.  Psychiatric/Behavioral: Negative for confusion.    Blood pressure 98/54, pulse 68, temperature 97 F (36.1 C), temperature source Temporal, resp. rate 16, height 4' 9.5" (1.461 m), weight 195 lb 9.6 oz (88.724 kg).  Physical Exam Physical Exam  Constitutional: She is oriented to person, place, and time. She appears well-developed and well-nourished. No distress.  HENT:  Head: Normocephalic and atraumatic.  Right Ear: External ear normal.  Left Ear: External ear normal.  Nose: Nose normal.  Mouth/Throat: Oropharynx is clear and moist.  Eyes: Conjunctivae are normal. Pupils are equal, round, and reactive to light. No scleral icterus.  Neck: Normal range of motion. Neck supple. No tracheal deviation present. No thyromegaly present.  Cardiovascular: Normal rate, regular rhythm, normal heart sounds and intact distal pulses.   No murmur heard. Pulmonary/Chest: Effort normal  and breath sounds normal. No respiratory distress. She has no wheezes. She has no rales.  Abdominal: Soft. Bowel sounds are normal. She exhibits no distension. There is no tenderness.  Musculoskeletal: Normal range of motion. She exhibits no edema and no tenderness.  Lymphadenopathy:    She has no cervical adenopathy.  Neurological: She is alert and oriented to person, place, and time.  Skin: Skin is warm and dry. No rash noted. No erythema.  Psychiatric: Her behavior is normal. Judgment normal.  There are no palpable masses in the breast.  Specifically in the left breast with previous tumor was palpable, no longer palpate a mass. I cannot palpate any axillary adenopathy  Data Reviewed   Assessment    Left breast cancer with nodal metastasis now status post neoadjuvant chemotherapy with complete response    Plan    I had a long discussion with the patient and her family. We are now recommending needle localized lumpectomy and complete axillary dissection of the left breast. I discussed this with them in detail. I discussed the risks of surgery which includes but is not limited to bleeding, infection, injury to surrounding structures including nerves, chronic swelling in the arm, need for further surgery if the margins are positive, et Karie Soda. They understand and wish to proceed. Surgery will be scheduled. Likelihood of success is good       Lesslie Mckeehan A 06/29/2011, 9:52 AM

## 2011-07-01 ENCOUNTER — Telehealth: Payer: Self-pay | Admitting: *Deleted

## 2011-07-01 NOTE — Telephone Encounter (Signed)
left voice message to inform the patient of the new date and time on 07-08-2011

## 2011-07-01 NOTE — Progress Notes (Signed)
Hematology and Oncology Follow Up Visit  [name] [id number] 11-26-1960 51 y.o. 06/29/11    HPI: [name] is a 51 year old [place] [facility] [place] woman with a locally advanced ER/PR positive at 99/2% respectively, HER-2 positive, cish ratio of 2.87, left breast carcinoma with biopsy-proven lymph node involvement. Initial staging breast MRI on 02/05/2011 showing a mass measuring 4.9 cm with multiple small nodules without fatty hila in the left axilla suspicious for metastatic disease. She completed 2 cycles of every 3 week neoadjuvant Taxotere/carboplatin, due to significant difficulty with the Taxotere component based i.e. significant PPE changes, and chest pain with her second infusion, her treatment course was switched to every 3 week IV CMF/Herceptin. Currently day 7 cycle 3/4 of every 3 week IV CMF, Herceptin held due to worsening left ventricular ejection fraction. 2. History of congestive heart failure, echocardiogram from 06/15/2011 revealing a left ventricular ejection fraction between 35 and 40%. Also on Lasix 120 mg in the a.m., 80 mg in the p.m. HCTZ 25 mg by mouth daily, lisinopril 20 mg by mouth daily, Coreg 25 mg by mouth twice a day 3. Hypothyroidism-on Synthroid 100 mcg daily 4. COPD 5. GERD on Prilosec 20 mg by mouth twice a day 6. Depression on Zoloft 100 mg by mouth daily 7. Reported history of myocardial infarction 7 years ago.  Interim History:   [name] is seen today with her dear friend and accompaniment for followup after completion cycle 4/4 every 3 week IV CMF given in the neoadjuvant setting with Herceptin being held due to CHF. She underwent repeat breast MRI on 06/29/2011, of note she has been seen by Dr. Rayburn Ma and is scheduled for definitive surgery on 07/16/2011. She admits that she is quite fatigued. She feels "done". She denies any increased shortness of breath issues or chest pain. She denies persistent nausea issues but she also notes "puffiness" of her facial  area which she attributes more to the soreness within her mouth. She has been utilizing the 2% viscous lidocaine-based mucositis mouthwash without significant effect. She denies any difficulty swallowing. She has had no diarrhea or constipation issues. She continues to have excessive eye tearing. She has requested increasing her Zoloft if possible. A detailed review of systems is otherwise noncontributory as noted below.  Review of Systems: Constitutional: Fatigue. Eyes: excessive tearing ENT:  Cardiovasc complains of mouth soreness and "puffiness" but denies any difficulty swallowing per se. Cor: Denies shortness of breath. Respiratory: positive for - shortness of breath Neurological: no TIA or stroke symptoms Dermatological: negative Gastrointestinal: no abdominal pain, change in bowel habits, or black or bloody stools Genito-Urinary: no dysuria, trouble voiding, or hematuria Hematological and Lymphatic: Negative. Breast: negative for breast lumps Patient unable to palpate L breast mass. Musculoskeletal: negative Remaining ROS negative.   Medications:   I have reviewed the patient's current medications.  Current Outpatient Prescriptions  Medication Sig Dispense Refill  . Alum & Mag Hydroxide-Simeth (MAGIC MOUTHWASH W/LIDOCAINE) SOLN Take 15 mLs by mouth 4 (four) times daily as needed.  400 mL  1  . carvedilol (COREG) 25 MG tablet Take 25 mg by mouth 2 (two) times daily with a meal.        . ciprofloxacin (CIPRO) 500 MG tablet Take 1 tablet (500 mg total) by mouth 2 (two) times daily.  14 tablet  0  . furosemide (LASIX) 40 MG tablet Take 40 mg by mouth daily. 3 in Am  2 in pm       . hydrochlorothiazide (HYDRODIURIL)  25 MG tablet Take 25 mg by mouth daily.        . Ibuprofen-Diphenhydramine HCl (ADVIL PM) 200-25 MG CAPS Take 3 capsules by mouth at bedtime.        [name] levothyroxine (SYNTHROID, LEVOTHROID) 100 MCG tablet Take 100 mcg by mouth daily.        [name] lidocaine-prilocaine (EMLA)  cream Apply topically as needed.  30 g  0  . lisinopril (PRINIVIL,ZESTRIL) 20 MG tablet Take 20 mg by mouth daily.        . magnesium oxide (MAG-OX) 400 MG tablet Take 800 mg by mouth daily.        . nicotine (NICODERM CQ - DOSED IN MG/24 HOURS) 21 mg/24hr patch Place 1 patch onto the skin daily.        . nitroGLYCERIN (NITROSTAT) 0.4 MG SL tablet Place 0.4 mg under the tongue every 5 (five) minutes as needed. As needed for chest pain.      [name] omeprazole (PRILOSEC) 20 MG capsule Take 20 mg by mouth 2 (two) times daily.       . ondansetron (ZOFRAN) 8 MG tablet Take by mouth every 12 (twelve) hours as needed.        [name] OVER THE COUNTER MEDICATION Take 3 capsules by mouth every morning. Green Tea Extract OTC.       [name] oxyCODONE-acetaminophen (PERCOCET) 5-325 MG per tablet Take 1 tablet by mouth 2 (two) times daily as needed. As needed for pain.      . potassium chloride SA (K-DUR,KLOR-CON) 20 MEQ tablet Take 20 mEq by mouth 2 (two) times daily.      . prochlorperazine (COMPAZINE) 10 MG tablet Take 1 tablet (10 mg total) by mouth every 6 (six) hours as needed.  30 tablet  3  . sertraline (ZOLOFT) 100 MG tablet Take 100 mg by mouth daily.        . sertraline (ZOLOFT) 50 MG tablet Take 1 tablet (50 mg total) by mouth daily.  30 tablet  1  . tobramycin-dexamethasone (TOBRADEX) ophthalmic solution Place 1 drop into both eyes every 4 (four) hours while awake.        . zolpidem (AMBIEN) 10 MG tablet Take 10 mg by mouth at bedtime.        No current facility-administered medications for this visit.   Facility-Administered Medications Ordered in Other Visits  Medication Dose Route Frequency Provider Last Rate Last Dose  . sodium chloride 0.9 % injection 10 mL  10 mL Intracatheter PRN Lowella Dell, MD        Allergies:  Allergies  Allergen Reactions  . Aspirin Hives  . Docetaxel Other (See Comments)    Chest tightness and pain  . Morphine And Related Hives and Swelling  . Penicillins Hives     Physical Exam: Filed Vitals:   06/29/11 1324  BP: 101/62  Pulse: 98  Temp: 98.6 F (37 C)  Weight: 197 lbs. HEENT:  Sclerae Slightly injected but no evidence of any today., conjunctivae pink,  Oropharynx shows evidence of possible blistering which is very vague in the buccal mucosa. Question viral etiology.  No frank evidence to suggest oral mucositis or candidiasis.   Nodes:  No cervical, supraclavicular, or axillary lymphadenopathy palpated.  Breast Exam: Deferred.  Lungs: Clear to auscultation bilaterally.  No crackles, rhonchi, or wheezes.   Heart:  Regular rate and rhythm, mild right-sided JVD. Abdomen:  Soft, nontender.  Positive bowel sounds.  No organomegaly or masses palpated.   Musculoskeletal:  No focal spinal tenderness to palpation, she does have some tenderness over her left shoulder region, no evidence of any palpable cords erythema or lymphedema.  Extremities:  Benign.  No peripheral edema or cyanosis.   Skin:  Benign.   Neuro:  Nonfocal, alert and oriented x 3.   Lab Results: Lab Results  Component Value Date   WBC 1.3* 06/29/2011   HGB 8.0* 06/29/2011   HCT 23.6* 06/29/2011   MCV 94.8 06/29/2011   PLT 120* 06/29/2011   NEUTROABS 0.2* 06/29/2011     Chemistry      Component Value Date/Time   NA 141 06/22/2011 1106   K 4.1 06/22/2011 1106   CL 105 06/22/2011 1106   CO2 27 06/22/2011 1106   BUN 22 06/22/2011 1106   CREATININE 1.27* 06/22/2011 1106      Component Value Date/Time   CALCIUM 9.6 06/22/2011 1106   ALKPHOS 79 06/22/2011 1106   AST 21 06/22/2011 1106   ALT 15 06/22/2011 1106   BILITOT 0.4 06/22/2011 1106     Echocardiogram: 06/15/11 Study Conclusions - Left ventricle: The cavity size was normal. Systolic function was moderately reduced. The estimated ejection fraction was in the range of 35% to 40%. Diffuse hypokinesis. - Ventricular septum: Septal motion showed abnormal function and dyssynergy. Transthoracic echocardiography. M-mode, complete 2D, spectral  Doppler, and color Doppler. Patient status: Inpatient. Location: Echo laboratory. Prepared and Electronically Authenticated by Everette Rank, MD     Assessment:  Tecla is a 51 year old [place] [facility] [place] woman with a locally advanced ER/PR positive at 99/2% respectively, HER-2 positive with the cish ratio of 2.87, left breast carcinoma with biopsy-proven lymph node involvement. Initial staging breast MRI on 02/05/2011 showing a mass measuring 4.9 cm with multiple small nodules without fatty hila in the left axilla suspicious for metastatic disease. She completed 2 cycles of every 3 week neoadjuvant Taxotere/carboplatin, due to significant difficulty with the Taxotere component based i.e. significant PPE changes, and chest pain with her second infusion, her treatment course was switched to every 3 week IV CMF/Herceptin. Currently  day 1,cycle 4/4 of every 3 week IV CMF/Herceptin.   2. History of severe afebrile neutropenia despite Neulasta on day 2.  3. History of congestive heart failure, echocardiogram from 01/23 2013 revealing a left ventricular ejection fraction between 50 and 55%. Also on Lasix 120 mg in the a.m., 80 mg in the p.m. HCTZ 25 mg by mouth daily, lisinopril 20 mg by mouth daily, Coreg 25 mg by mouth twice a day. Worsening left ventricular ejection fraction reported between 35-40% on echocardiogram obtained on 06/15/2011. Patient is clinically asymptomatic.  4. Hypothyroidism-on Synthroid 100 mcg daily 5. COPD  6. GERD on Prilosec 20 mg by mouth twice a day  7. Depression on Zoloft 100 mg by mouth daily  8. History of symptomatic anemia, s/p 2 units of packed RBCs given on 05/21/2011.  Case has been reviewed with  Dr. Pierce Crane, who also examined patient.  Plan:  Amiley's case has been reviewed with Dr. Donnie Coffin, who also examined the patient. It is noted that she is neutropenic, she will initiate Cipro 500 mg by mouth twice a day for the next 7 days. We will increase  her Zoloft to a total of 150 mg per day, therefore prescription for Zoloft 50 mg was provided. We have also given her prescription for [facility] mouthwash. We will plan on repeating a CBC with a brief followup exam and 1 weeks time to make sure that  she is recovering, This plan was reviewed with the patient, who voices understanding and agreement.  She knows to call with any changes or problems.    Braniya Farrugia T, PA-C 06/29/11

## 2011-07-05 ENCOUNTER — Encounter (HOSPITAL_COMMUNITY): Payer: Self-pay | Admitting: Pharmacy Technician

## 2011-07-06 ENCOUNTER — Encounter (HOSPITAL_COMMUNITY): Payer: Self-pay

## 2011-07-06 ENCOUNTER — Encounter (HOSPITAL_COMMUNITY)
Admission: RE | Admit: 2011-07-06 | Discharge: 2011-07-06 | Disposition: A | Discharge status: Home / Self Care | Payer: Medicare Other | Source: Ambulatory Visit | Attending: Surgery | Admitting: Surgery

## 2011-07-06 VITALS — BP 117/67 | HR 67 | Temp 98.3°F | Resp 20 | Ht <= 58 in | Wt 195.7 lb

## 2011-07-06 DIAGNOSIS — K219 Gastro-esophageal reflux disease without esophagitis: Secondary | ICD-10-CM | POA: Diagnosis not present

## 2011-07-06 DIAGNOSIS — D0590 Unspecified type of carcinoma in situ of unspecified breast: Secondary | ICD-10-CM | POA: Diagnosis not present

## 2011-07-06 DIAGNOSIS — I509 Heart failure, unspecified: Secondary | ICD-10-CM | POA: Diagnosis not present

## 2011-07-06 DIAGNOSIS — J449 Chronic obstructive pulmonary disease, unspecified: Secondary | ICD-10-CM | POA: Diagnosis not present

## 2011-07-06 DIAGNOSIS — I10 Essential (primary) hypertension: Secondary | ICD-10-CM | POA: Diagnosis not present

## 2011-07-06 DIAGNOSIS — C773 Secondary and unspecified malignant neoplasm of axilla and upper limb lymph nodes: Secondary | ICD-10-CM | POA: Diagnosis not present

## 2011-07-06 DIAGNOSIS — G4730 Sleep apnea, unspecified: Secondary | ICD-10-CM | POA: Diagnosis not present

## 2011-07-06 DIAGNOSIS — C50919 Malignant neoplasm of unspecified site of unspecified female breast: Secondary | ICD-10-CM | POA: Diagnosis not present

## 2011-07-06 DIAGNOSIS — C50912 Malignant neoplasm of unspecified site of left female breast: Secondary | ICD-10-CM

## 2011-07-06 DIAGNOSIS — F329 Major depressive disorder, single episode, unspecified: Secondary | ICD-10-CM | POA: Diagnosis not present

## 2011-07-06 DIAGNOSIS — E039 Hypothyroidism, unspecified: Secondary | ICD-10-CM | POA: Diagnosis not present

## 2011-07-06 DIAGNOSIS — I209 Angina pectoris, unspecified: Secondary | ICD-10-CM | POA: Diagnosis not present

## 2011-07-06 DIAGNOSIS — Z01812 Encounter for preprocedural laboratory examination: Secondary | ICD-10-CM | POA: Diagnosis not present

## 2011-07-06 HISTORY — DX: Acute myocardial infarction, unspecified: I21.9

## 2011-07-06 LAB — CBC
HCT: 26.7 % — ABNORMAL LOW (ref 36.0–46.0)
MCV: 96.4 fL (ref 78.0–100.0)
RBC: 2.77 MIL/uL — ABNORMAL LOW (ref 3.87–5.11)
WBC: 6.1 10*3/uL (ref 4.0–10.5)

## 2011-07-06 LAB — SURGICAL PCR SCREEN: Staphylococcus aureus: NEGATIVE

## 2011-07-06 LAB — BASIC METABOLIC PANEL
CO2: 28 mEq/L (ref 19–32)
Chloride: 103 mEq/L (ref 96–112)
Sodium: 142 mEq/L (ref 135–145)

## 2011-07-06 NOTE — Pre-Procedure Instructions (Signed)
20 [name]  07/06/2011   Your procedure is scheduled on:  July 16, 2011  Report to [facility] Stay [facility] at 10:15 AM.  Call this number if you have problems the morning of surgery: [phone]   Remember:   Do not eat food:After Midnight.  May have clear liquids: up to 4 Hours before arrival.  Clear liquids include soda, tea, black coffee, apple or grape juice, broth.  Take these medicines the morning of surgery with A SIP OF WATER: CARVEDILOL, LEVOTHRIOD, PRILOSEC, PAIN PILL, ZOLOFT   Do not wear jewelry, make-up or nail polish.  Do not wear lotions, powders, or perfumes. You may wear deodorant.  Do not shave 48 hours prior to surgery.  Do not bring valuables to the hospital.  Contacts, dentures or bridgework may not be worn into surgery.  Leave suitcase in the car. After surgery it may be brought to your room.  For patients admitted to the hospital, checkout time is 11:00 AM the day of discharge.   Patients discharged the day of surgery will not be allowed to drive home.  Name and phone number of your driver: NA  Special Instructions: CHG Shower Use Special Wash: 1/2 bottle night before surgery and 1/2 bottle morning of surgery.   Please read over the following fact sheets that you were given: Pain Booklet, MRSA Information and Surgical Site Infection Prevention

## 2011-07-06 NOTE — Progress Notes (Signed)
Left message for pt to verify breast center appt 11am dos per office

## 2011-07-07 ENCOUNTER — Encounter: Payer: Self-pay | Admitting: Genetic Counselor

## 2011-07-08 ENCOUNTER — Ambulatory Visit: Payer: Medicare Other | Admitting: Physician Assistant

## 2011-07-08 ENCOUNTER — Encounter (HOSPITAL_COMMUNITY): Payer: Self-pay | Admitting: Vascular Surgery

## 2011-07-08 ENCOUNTER — Other Ambulatory Visit: Payer: Medicare Other

## 2011-07-08 NOTE — Consult Note (Addendum)
Anesthesia Chart Review:  Patient is a 51 year old female scheduled for a left breast needle localized lumpectomy and complete left axillary LN dissection on 07/16/11.    History includes left breast CA s/p chemotherapy, former smoker, depression, hypothyroidism, OSA, obesity with BMI 40, non-ischemic dilated CM, chronic systolic CHF, MI (per history in Epic, but not noted in Dr. Norris Cross notes), emphysema, GERD, murmur, anemia, TAH/BSO, PAC insertion 02/2011.  Her meds include a B-blocker, ACEI, diuretic therapy.  Her Cardiologist is Dr. Mayford Knife. Her last visit was 04/23/11.  Her last echo with contrast on 06/15/11 showed: Left ventricle: The cavity size was normal. Systolic function was moderately reduced. The estimated ejection fraction was in the range of 35% to 40%. Diffuse Hypokinesis. Ventricular septum: Septal motion showed abnormal function and dyssynergy.  Echo without contrast on 05/27/11 showed: Left ventricle: The cavity size was normal. Wall thickness was increased in a pattern of mild LVH. Systolic function was mildly to moderately reduced. The estimated ejection fraction was in the range of 40% to 45%. Left ventricular diastolic function parameters were normal.  A nuclear stress test was done [facility]) on 05/14/11 and showed normal myocardial perfusion, post-stress EF was measured at 40% (under the impression, however, it is documented as 55%).  EKG then showed SR, nonspecific ST/T wave abnormality (inferior leads).  CXR on 02/23/11 show no acute infiltrate or edema. Probable chronic mild bronchitic changes.   Labs from 07/06/11 noted.  Cr 1.34.  H/H 8.5, 26.7 (up from 06/29/11).  PLT 156.  She is scheduled to see Sharyl Nimrod, PA-C at [facility] today.  Will follow-up to see if any labs are repeated.  Message also sent to Dr. Magnus Ivan via Epic.    Addendum:  07/13/11 1630  No additional labs have been done since patient's PAT visit on 07/06/11.  Will order a T&S preoperatively.  Anticipate  she can proceed if no acute CV/CHF symptoms.  Reviewed with Anesthesiologist Dr. Chaney Malling who agrees with this plan.

## 2011-07-09 ENCOUNTER — Telehealth: Payer: Self-pay | Admitting: *Deleted

## 2011-07-09 NOTE — Telephone Encounter (Signed)
left voice mail to inform the patient of the new and date and time on 08-20-2011 starting at 1:00pm

## 2011-07-09 NOTE — Progress Notes (Signed)
POF sent to schedulers due to FTKA on 07/08/11-CTS

## 2011-07-15 MED ORDER — CIPROFLOXACIN IN D5W 400 MG/200ML IV SOLN
400.0000 mg | INTRAVENOUS | Status: AC
  Administered 2011-07-16: 400 mg via INTRAVENOUS
  Filled 2011-07-15: qty 200

## 2011-07-15 NOTE — H&P (Signed)
Chief Complaint   Patient presents with   .  Follow-up     left breast cancer    HPI  [name] is a 51 y.o. female. Left breast cancer  HPI  She is here for followup of her left breast cancer. She is completing her chemotherapy. She has had an excellent response. Her MRI in February showed almost a complete response to chemotherapy with resolution of the mass in the axillary adenopathy. She'll be having another MRI later today. She is complaining of some left shoulder pain today. She has no pain regarding her breast and denies nipple discharge  Past Medical History   Diagnosis  Date   .  Depression    .  Hypothyroidism    .  Sleep apnea    .  Congestive heart failure    .  Emphysema of lung    .  GERD (gastroesophageal reflux disease)    .  Heart murmur    .  Hypertension    .  Breast cancer      left breast   .  Anemia  05/19/2011    Past Surgical History   Procedure  Date   .  Abdominal hysterectomy    .  Tah-    .  Slapingooophorectomy, right  remote   .  Portacath placement  02/26/2011     Procedure: INSERTION PORT-A-CATH; Surgeon: Shelly Rubenstein, MD; Location: [facility] OR; Service: General; Laterality: Right;   .  Breast biopsy      left breast    Family History   Problem  Relation  Age of Onset   .  Leukemia  Mother    .  Heart disease  Maternal Grandmother     Social History  History   Substance Use Topics   .  Smoking status:  Former Smoker -- 1.0 packs/day     Quit date:  02/20/2011   .  Smokeless tobacco:  Never Used   .  Alcohol Use:  0.0 oz/week     5-7 Glasses of wine per week    Allergies   Allergen  Reactions   .  Aspirin  Hives   .  Docetaxel  Other (See Comments)     Chest tightness and pain   .  Morphine And Related  Hives and Swelling   .  Penicillins  Hives    Current Outpatient Prescriptions   Medication  Sig  Dispense  Refill   .  Alum & Mag Hydroxide-Simeth (MAGIC MOUTHWASH W/LIDOCAINE) SOLN  Take 5 mLs by mouth 4 (four) times daily as  needed.  240 mL  3   .  carvedilol (COREG) 25 MG tablet  Take 25 mg by mouth 2 (two) times daily with a meal.     .  furosemide (LASIX) 40 MG tablet  Take 40 mg by mouth daily. 3 in Am  2 in pm     .  hydrochlorothiazide (HYDRODIURIL) 25 MG tablet  Take 25 mg by mouth daily.     .  Ibuprofen-Diphenhydramine HCl (ADVIL PM) 200-25 MG CAPS  Take 3 capsules by mouth at bedtime.     [name]  levothyroxine (SYNTHROID, LEVOTHROID) 100 MCG tablet  Take 100 mcg by mouth daily.     [name]  lidocaine-prilocaine (EMLA) cream  Apply topically as needed.  30 g  0   .  lisinopril (PRINIVIL,ZESTRIL) 20 MG tablet  Take 20 mg by mouth daily.     .  magnesium oxide (  MAG-OX) 400 MG tablet  Take 800 mg by mouth daily.     .  nicotine (NICODERM CQ - DOSED IN MG/24 HOURS) 21 mg/24hr patch  Place 1 patch onto the skin daily.     .  nitroGLYCERIN (NITROSTAT) 0.4 MG SL tablet  Place 0.4 mg under the tongue every 5 (five) minutes as needed. As needed for chest pain.     [name]  omeprazole (PRILOSEC) 20 MG capsule  Take 20 mg by mouth 2 (two) times daily.     .  ondansetron (ZOFRAN) 8 MG tablet  Take by mouth every 12 (twelve) hours as needed.     [name]  OVER THE COUNTER MEDICATION  Take 3 capsules by mouth every morning. Green Tea Extract OTC.     [name]  oxyCODONE-acetaminophen (PERCOCET) 5-325 MG per tablet  Take 1 tablet by mouth 2 (two) times daily as needed. As needed for pain.     .  potassium chloride SA (K-DUR,KLOR-CON) 20 MEQ tablet  Take 20 mEq by mouth 2 (two) times daily.     .  prochlorperazine (COMPAZINE) 10 MG tablet  Take 1 tablet (10 mg total) by mouth every 6 (six) hours as needed.  30 tablet  3   .  sertraline (ZOLOFT) 100 MG tablet  Take 100 mg by mouth daily.     [name]  tobramycin-dexamethasone (TOBRADEX) ophthalmic solution  Place 1 drop into both eyes every 4 (four) hours while awake.     .  zolpidem (AMBIEN) 10 MG tablet  Take 10 mg by mouth at bedtime.      No current facility-administered medications for this visit.     Facility-Administered Medications Ordered in Other Visits   Medication  Dose  Route  Frequency  Provider  Last Rate  Last Dose   .  sodium chloride 0.9 % injection 10 mL  10 mL  Intracatheter  PRN  Lowella Dell, MD      Review of Systems  Review of Systems  Constitutional: Negative for fever, chills and unexpected weight change.  HENT: Negative for hearing loss, congestion, sore throat, trouble swallowing and voice change.  Eyes: Negative for visual disturbance.  Respiratory: Negative for cough and wheezing.  Cardiovascular: Negative for chest pain, palpitations and leg swelling.  Gastrointestinal: Negative for nausea, vomiting, abdominal pain, diarrhea, constipation, blood in stool, abdominal distention and anal bleeding.  Genitourinary: Negative for hematuria, vaginal bleeding and difficulty urinating.  Musculoskeletal: Negative for arthralgias.  Skin: Negative for rash and wound.  Neurological: Negative for seizures, syncope and headaches.  Hematological: Negative for adenopathy. Does not bruise/bleed easily.  Psychiatric/Behavioral: Negative for confusion.   Blood pressure 98/54, pulse 68, temperature 97 F (36.1 C), temperature source Temporal, resp. rate 16, height 4' 9.5" (1.461 m), weight 195 lb 9.6 oz (88.724 kg).  Physical Exam  Physical Exam  Constitutional: She is oriented to person, place, and time. She appears well-developed and well-nourished. No distress.  HENT:  Head: Normocephalic and atraumatic.  Right Ear: External ear normal.  Left Ear: External ear normal.  Nose: Nose normal.  Mouth/Throat: Oropharynx is clear and moist.  Eyes: Conjunctivae are normal. Pupils are equal, round, and reactive to light. No scleral icterus.  Neck: Normal range of motion. Neck supple. No tracheal deviation present. No thyromegaly present.  Cardiovascular: Normal rate, regular rhythm, normal heart sounds and intact distal pulses.  No murmur heard.  Pulmonary/Chest: Effort  normal and breath sounds normal. No respiratory distress. She has no wheezes.  She has no rales.  Abdominal: Soft. Bowel sounds are normal. She exhibits no distension. There is no tenderness.  Musculoskeletal: Normal range of motion. She exhibits no edema and no tenderness.  Lymphadenopathy:  She has no cervical adenopathy.  Neurological: She is alert and oriented to person, place, and time.  Skin: Skin is warm and dry. No rash noted. No erythema.  Psychiatric: Her behavior is normal. Judgment normal.  There are no palpable masses in the breast. Specifically in the left breast with previous tumor was palpable, no longer palpate a mass. I cannot palpate any axillary adenopathy  Data Reviewed  Assessment   Left breast cancer with nodal metastasis now status post neoadjuvant chemotherapy with complete response   Plan   I had a long discussion with the patient and her family. We are now recommending needle localized lumpectomy and complete axillary dissection of the left breast. I discussed this with them in detail. I discussed the risks of surgery which includes but is not limited to bleeding, infection, injury to surrounding structures including nerves, chronic swelling in the arm, need for further surgery if the margins are positive, et Karie Soda. They understand and wish to proceed. Surgery will be scheduled. Likelihood of success is good   Sacha Radloff A

## 2011-07-16 ENCOUNTER — Ambulatory Visit (HOSPITAL_COMMUNITY): Payer: Medicare Other | Admitting: Vascular Surgery

## 2011-07-16 ENCOUNTER — Ambulatory Visit
Admission: RE | Admit: 2011-07-16 | Discharge: 2011-07-16 | Disposition: A | Discharge status: Home / Self Care | Payer: Medicare Other | Source: Ambulatory Visit | Attending: Surgery | Admitting: Surgery

## 2011-07-16 ENCOUNTER — Encounter (HOSPITAL_COMMUNITY): Payer: Self-pay | Admitting: *Deleted

## 2011-07-16 ENCOUNTER — Ambulatory Visit (HOSPITAL_COMMUNITY)
Admission: RE | Admit: 2011-07-16 | Discharge: 2011-07-17 | Disposition: A | Discharge status: Home / Self Care | Payer: Medicare Other | Source: Ambulatory Visit | Attending: Surgery | Admitting: Surgery

## 2011-07-16 ENCOUNTER — Encounter (HOSPITAL_COMMUNITY): Payer: Self-pay | Admitting: Vascular Surgery

## 2011-07-16 ENCOUNTER — Encounter (HOSPITAL_COMMUNITY)
Admission: RE | Disposition: A | Discharge status: Home / Self Care | Payer: Self-pay | Source: Ambulatory Visit | Attending: Surgery

## 2011-07-16 ENCOUNTER — Encounter (HOSPITAL_COMMUNITY): Payer: Self-pay | Admitting: General Practice

## 2011-07-16 DIAGNOSIS — J4489 Other specified chronic obstructive pulmonary disease: Secondary | ICD-10-CM | POA: Insufficient documentation

## 2011-07-16 DIAGNOSIS — C50919 Malignant neoplasm of unspecified site of unspecified female breast: Secondary | ICD-10-CM

## 2011-07-16 DIAGNOSIS — K219 Gastro-esophageal reflux disease without esophagitis: Secondary | ICD-10-CM | POA: Diagnosis not present

## 2011-07-16 DIAGNOSIS — E039 Hypothyroidism, unspecified: Secondary | ICD-10-CM | POA: Insufficient documentation

## 2011-07-16 DIAGNOSIS — C50912 Malignant neoplasm of unspecified site of left female breast: Secondary | ICD-10-CM

## 2011-07-16 DIAGNOSIS — I10 Essential (primary) hypertension: Secondary | ICD-10-CM | POA: Diagnosis not present

## 2011-07-16 DIAGNOSIS — I471 Supraventricular tachycardia: Secondary | ICD-10-CM | POA: Diagnosis not present

## 2011-07-16 DIAGNOSIS — I209 Angina pectoris, unspecified: Secondary | ICD-10-CM | POA: Insufficient documentation

## 2011-07-16 DIAGNOSIS — G4730 Sleep apnea, unspecified: Secondary | ICD-10-CM | POA: Diagnosis not present

## 2011-07-16 DIAGNOSIS — J449 Chronic obstructive pulmonary disease, unspecified: Secondary | ICD-10-CM | POA: Insufficient documentation

## 2011-07-16 DIAGNOSIS — I509 Heart failure, unspecified: Secondary | ICD-10-CM | POA: Insufficient documentation

## 2011-07-16 DIAGNOSIS — F3289 Other specified depressive episodes: Secondary | ICD-10-CM | POA: Insufficient documentation

## 2011-07-16 DIAGNOSIS — C773 Secondary and unspecified malignant neoplasm of axilla and upper limb lymph nodes: Secondary | ICD-10-CM | POA: Diagnosis not present

## 2011-07-16 DIAGNOSIS — D0590 Unspecified type of carcinoma in situ of unspecified breast: Secondary | ICD-10-CM | POA: Diagnosis not present

## 2011-07-16 DIAGNOSIS — R0602 Shortness of breath: Secondary | ICD-10-CM

## 2011-07-16 DIAGNOSIS — F329 Major depressive disorder, single episode, unspecified: Secondary | ICD-10-CM | POA: Insufficient documentation

## 2011-07-16 DIAGNOSIS — Z01812 Encounter for preprocedural laboratory examination: Secondary | ICD-10-CM | POA: Diagnosis not present

## 2011-07-16 HISTORY — DX: Shortness of breath: R06.02

## 2011-07-16 HISTORY — DX: Low back pain: M54.5

## 2011-07-16 HISTORY — DX: Personal history of other diseases of the respiratory system: Z87.09

## 2011-07-16 HISTORY — PX: BREAST LUMPECTOMY: SHX2

## 2011-07-16 HISTORY — DX: Low back pain, unspecified: M54.50

## 2011-07-16 HISTORY — DX: Malignant neoplasm of unspecified site of unspecified female breast: C50.919

## 2011-07-16 HISTORY — DX: Other chronic pain: G89.29

## 2011-07-16 LAB — ABO/RH: ABO/RH(D): A POS

## 2011-07-16 LAB — TYPE AND SCREEN

## 2011-07-16 SURGERY — BREAST LUMPECTOMY WITH NEEDLE LOCALIZATION AND AXILLARY LYMPH NODE DISSECTION
Anesthesia: General | Laterality: Left | Wound class: Clean

## 2011-07-16 MED ORDER — PROPOFOL 10 MG/ML IV EMUL
INTRAVENOUS | Status: DC | PRN
  Administered 2011-07-16: 150 mg via INTRAVENOUS
  Administered 2011-07-16: 50 mg via INTRAVENOUS

## 2011-07-16 MED ORDER — TOBRAMYCIN-DEXAMETHASONE 0.3-0.1 % OP SUSP
1.0000 [drp] | OPHTHALMIC | Status: DC
  Administered 2011-07-16 – 2011-07-17 (×2): 1 [drp] via OPHTHALMIC
  Filled 2011-07-16: qty 2.5

## 2011-07-16 MED ORDER — OXYCODONE-ACETAMINOPHEN 5-325 MG PO TABS
1.0000 | ORAL_TABLET | ORAL | Status: DC | PRN
  Administered 2011-07-17 (×2): 1 via ORAL
  Filled 2011-07-16 (×2): qty 1

## 2011-07-16 MED ORDER — SERTRALINE HCL 50 MG PO TABS
50.0000 mg | ORAL_TABLET | Freq: Every day | ORAL | Status: DC | PRN
  Filled 2011-07-16: qty 1

## 2011-07-16 MED ORDER — FUROSEMIDE 80 MG PO TABS: 80.0000 mg | ORAL_TABLET | Freq: Every day | ORAL | Status: DC

## 2011-07-16 MED ORDER — NITROGLYCERIN 0.4 MG SL SUBL: 0.4000 mg | SUBLINGUAL_TABLET | SUBLINGUAL | Status: DC | PRN

## 2011-07-16 MED ORDER — PROMETHAZINE HCL 25 MG/ML IJ SOLN: 6.2500 mg | INTRAMUSCULAR | Status: DC | PRN

## 2011-07-16 MED ORDER — SERTRALINE HCL 50 MG PO TABS: 50.0000 mg | ORAL_TABLET | Freq: Every day | ORAL | Status: DC | PRN

## 2011-07-16 MED ORDER — BUPIVACAINE-EPINEPHRINE 0.25% -1:200000 IJ SOLN
INTRAMUSCULAR | Status: DC | PRN
  Administered 2011-07-16: 20 mL

## 2011-07-16 MED ORDER — LISINOPRIL 20 MG PO TABS
20.0000 mg | ORAL_TABLET | Freq: Every day | ORAL | Status: DC
  Administered 2011-07-16: 20 mg via ORAL
  Filled 2011-07-16 (×2): qty 1

## 2011-07-16 MED ORDER — PANTOPRAZOLE SODIUM 40 MG PO TBEC: 40.0000 mg | DELAYED_RELEASE_TABLET | Freq: Every day | ORAL | Status: DC

## 2011-07-16 MED ORDER — PHENYLEPHRINE HCL 10 MG/ML IJ SOLN
INTRAMUSCULAR | Status: DC | PRN
  Administered 2011-07-16 (×6): 40 ug via INTRAVENOUS
  Administered 2011-07-16: 80 ug via INTRAVENOUS

## 2011-07-16 MED ORDER — ONDANSETRON HCL 4 MG/2ML IJ SOLN
INTRAMUSCULAR | Status: DC | PRN
  Administered 2011-07-16: 4 mg via INTRAVENOUS

## 2011-07-16 MED ORDER — LIDOCAINE HCL (CARDIAC) 20 MG/ML IV SOLN
INTRAVENOUS | Status: DC | PRN
  Administered 2011-07-16: 65 mg via INTRAVENOUS

## 2011-07-16 MED ORDER — 0.9 % SODIUM CHLORIDE (POUR BTL) OPTIME
TOPICAL | Status: DC | PRN
  Administered 2011-07-16: 1000 mL

## 2011-07-16 MED ORDER — GLYCOPYRROLATE 0.2 MG/ML IJ SOLN
INTRAMUSCULAR | Status: DC | PRN
  Administered 2011-07-16 (×2): 0.1 mg via INTRAVENOUS

## 2011-07-16 MED ORDER — FENTANYL CITRATE 0.05 MG/ML IJ SOLN
INTRAMUSCULAR | Status: AC
  Filled 2011-07-16: qty 2

## 2011-07-16 MED ORDER — LACTATED RINGERS IV SOLN
INTRAVENOUS | Status: DC
  Administered 2011-07-16: 13:00:00 via INTRAVENOUS

## 2011-07-16 MED ORDER — LEVOTHYROXINE SODIUM 100 MCG PO TABS
100.0000 ug | ORAL_TABLET | Freq: Every day | ORAL | Status: DC
  Administered 2011-07-16: 100 ug via ORAL
  Filled 2011-07-16 (×2): qty 1

## 2011-07-16 MED ORDER — ZOLPIDEM TARTRATE 5 MG PO TABS
10.0000 mg | ORAL_TABLET | Freq: Every day | ORAL | Status: DC
  Administered 2011-07-16: 10 mg via ORAL
  Filled 2011-07-16: qty 2

## 2011-07-16 MED ORDER — FUROSEMIDE 80 MG PO TABS
80.0000 mg | ORAL_TABLET | Freq: Every day | ORAL | Status: DC
  Administered 2011-07-16: 80 mg via ORAL
  Filled 2011-07-16 (×2): qty 1

## 2011-07-16 MED ORDER — ONDANSETRON HCL 4 MG/2ML IJ SOLN: 4.0000 mg | Freq: Four times a day (QID) | INTRAMUSCULAR | Status: DC | PRN

## 2011-07-16 MED ORDER — FENTANYL CITRATE 0.05 MG/ML IJ SOLN
INTRAMUSCULAR | Status: DC | PRN
  Administered 2011-07-16: 25 ug via INTRAVENOUS
  Administered 2011-07-16: 50 ug via INTRAVENOUS
  Administered 2011-07-16 (×3): 25 ug via INTRAVENOUS

## 2011-07-16 MED ORDER — ENOXAPARIN SODIUM 40 MG/0.4ML ~~LOC~~ SOLN
40.0000 mg | Freq: Every day | SUBCUTANEOUS | Status: DC
  Filled 2011-07-16: qty 0.4

## 2011-07-16 MED ORDER — CARVEDILOL 25 MG PO TABS
25.0000 mg | ORAL_TABLET | Freq: Two times a day (BID) | ORAL | Status: DC
  Administered 2011-07-16: 25 mg via ORAL
  Filled 2011-07-16 (×4): qty 1

## 2011-07-16 MED ORDER — HYDROMORPHONE HCL PF 1 MG/ML IJ SOLN
2.0000 mg | INTRAMUSCULAR | Status: DC | PRN
  Administered 2011-07-16 (×2): 2 mg via INTRAVENOUS
  Filled 2011-07-16 (×2): qty 1

## 2011-07-16 MED ORDER — ONDANSETRON HCL 4 MG PO TABS: 4.0000 mg | ORAL_TABLET | Freq: Four times a day (QID) | ORAL | Status: DC | PRN

## 2011-07-16 MED ORDER — MAGNESIUM OXIDE 400 MG PO TABS
800.0000 mg | ORAL_TABLET | Freq: Every day | ORAL | Status: DC
  Filled 2011-07-16: qty 2

## 2011-07-16 MED ORDER — HYDROMORPHONE HCL PF 1 MG/ML IJ SOLN
INTRAMUSCULAR | Status: AC
  Filled 2011-07-16: qty 2

## 2011-07-16 MED ORDER — FUROSEMIDE 80 MG PO TABS
120.0000 mg | ORAL_TABLET | Freq: Every day | ORAL | Status: DC
  Filled 2011-07-16: qty 1

## 2011-07-16 MED ORDER — POTASSIUM CHLORIDE IN NACL 20-0.9 MEQ/L-% IV SOLN
INTRAVENOUS | Status: DC
  Administered 2011-07-16: 1000 mL via INTRAVENOUS
  Filled 2011-07-16 (×3): qty 1000

## 2011-07-16 MED ORDER — FENTANYL CITRATE 0.05 MG/ML IJ SOLN
25.0000 ug | INTRAMUSCULAR | Status: DC | PRN
  Administered 2011-07-16 (×3): 50 ug via INTRAVENOUS

## 2011-07-16 MED ORDER — SERTRALINE HCL 100 MG PO TABS
100.0000 mg | ORAL_TABLET | Freq: Every day | ORAL | Status: DC
  Filled 2011-07-16: qty 1

## 2011-07-16 MED ORDER — LACTATED RINGERS IV SOLN
INTRAVENOUS | Status: DC | PRN
  Administered 2011-07-16 (×2): via INTRAVENOUS

## 2011-07-16 SURGICAL SUPPLY — 48 items
APPLIER CLIP 9.375 MED OPEN (MISCELLANEOUS) ×2
BENZOIN TINCTURE PRP APPL 2/3 (GAUZE/BANDAGES/DRESSINGS) ×2 IMPLANT
BINDER BREAST LRG (GAUZE/BANDAGES/DRESSINGS) IMPLANT
BINDER BREAST XLRG (GAUZE/BANDAGES/DRESSINGS) IMPLANT
BLADE SURG 15 STRL LF DISP TIS (BLADE) ×1 IMPLANT
BLADE SURG 15 STRL SS (BLADE) ×1
CANISTER SUCTION 2500CC (MISCELLANEOUS) ×2 IMPLANT
CHLORAPREP W/TINT 26ML (MISCELLANEOUS) ×2 IMPLANT
CLIP APPLIE 9.375 MED OPEN (MISCELLANEOUS) ×1 IMPLANT
CLOTH BEACON ORANGE TIMEOUT ST (SAFETY) ×2 IMPLANT
CONT SPEC 4OZ CLIKSEAL STRL BL (MISCELLANEOUS) ×2 IMPLANT
COVER PROBE W GEL 5X96 (DRAPES) ×2 IMPLANT
COVER SURGICAL LIGHT HANDLE (MISCELLANEOUS) ×2 IMPLANT
DEVICE DUBIN SPECIMEN MAMMOGRA (MISCELLANEOUS) ×2 IMPLANT
DRAPE LAPAROSCOPIC ABDOMINAL (DRAPES) ×2 IMPLANT
DRSG TEGADERM 4X4.75 (GAUZE/BANDAGES/DRESSINGS) ×2 IMPLANT
ELECT CAUTERY BLADE 6.4 (BLADE) ×2 IMPLANT
ELECT REM PT RETURN 9FT ADLT (ELECTROSURGICAL) ×2
ELECTRODE REM PT RTRN 9FT ADLT (ELECTROSURGICAL) ×1 IMPLANT
GLOVE BIOGEL PI IND STRL 7.5 (GLOVE) ×1 IMPLANT
GLOVE BIOGEL PI INDICATOR 7.5 (GLOVE) ×1
GLOVE SURG SIGNA 7.5 PF LTX (GLOVE) ×2 IMPLANT
GLOVE SURG SS PI 7.5 STRL IVOR (GLOVE) ×4 IMPLANT
GOWN PREVENTION PLUS XLARGE (GOWN DISPOSABLE) ×2 IMPLANT
GOWN STRL NON-REIN LRG LVL3 (GOWN DISPOSABLE) ×2 IMPLANT
KIT BASIN OR (CUSTOM PROCEDURE TRAY) ×2 IMPLANT
KIT MARKER MARGIN INK (KITS) ×2 IMPLANT
KIT ROOM TURNOVER OR (KITS) ×2 IMPLANT
NEEDLE 18GX1X1/2 (RX/OR ONLY) (NEEDLE) ×2 IMPLANT
NEEDLE HYPO 25GX1X1/2 BEV (NEEDLE) ×4 IMPLANT
NS IRRIG 1000ML POUR BTL (IV SOLUTION) ×2 IMPLANT
PACK SURGICAL SETUP 50X90 (CUSTOM PROCEDURE TRAY) ×2 IMPLANT
PAD ARMBOARD 7.5X6 YLW CONV (MISCELLANEOUS) ×2 IMPLANT
PENCIL BUTTON HOLSTER BLD 10FT (ELECTRODE) ×2 IMPLANT
SPONGE GAUZE 4X4 12PLY (GAUZE/BANDAGES/DRESSINGS) ×2 IMPLANT
SPONGE LAP 4X18 X RAY DECT (DISPOSABLE) ×2 IMPLANT
STRIP CLOSURE SKIN 1/2X4 (GAUZE/BANDAGES/DRESSINGS) ×2 IMPLANT
SUT MON AB 4-0 PC3 18 (SUTURE) ×2 IMPLANT
SUT SILK 2 0 SH (SUTURE) ×2 IMPLANT
SUT VIC AB 3-0 SH 27 (SUTURE) ×1
SUT VIC AB 3-0 SH 27XBRD (SUTURE) ×1 IMPLANT
SYR BULB 3OZ (MISCELLANEOUS) ×2 IMPLANT
SYR CONTROL 10ML LL (SYRINGE) ×4 IMPLANT
TAPE CLOTH SURG 6X10 WHT LF (GAUZE/BANDAGES/DRESSINGS) ×2 IMPLANT
TOWEL OR 17X24 6PK STRL BLUE (TOWEL DISPOSABLE) ×2 IMPLANT
TOWEL OR 17X26 10 PK STRL BLUE (TOWEL DISPOSABLE) ×2 IMPLANT
TUBE CONNECTING 12X1/4 (SUCTIONS) ×2 IMPLANT
YANKAUER SUCT BULB TIP NO VENT (SUCTIONS) ×2 IMPLANT

## 2011-07-16 NOTE — Op Note (Signed)
BREAST LUMPECTOMY WITH NEDLE LOCALIZATION AND AXILLARY LYMPH NODE DISSECTION  Procedure Note  [name] 07/16/2011   Pre-op Diagnosis: left breast cancer     Post-op Diagnosis: left breast cancer  Procedure(s): LEFT BREAST LUMPECTOMY WITH NEEDLE LOCALIZATION AND AXILLARY LYMPH NODE DISSECTION  Surgeon(s): Shelly Rubenstein, MD  Anesthesia: General  Staff:  Levonne Spiller, RN - Circulator Janeece Agee Pingue, CST - Scrub Person [place], [place] - Circulator Simonne Maffucci, RN - Relief Circulator Leighton Parody - Relief Scrub Sudie Bailey, RN - Relief Circulator  Estimated Blood Loss: Minimal               Specimens: left breast and left axillary contents  Indications: This is [name] 51 year old female who received neoadjuvant therapy for [name] left breast cancer which was lymph node positive by biopsy. She has had [name] complete response and now presents for needle localized lumpectomy and complete axillary dissection on the left-sided.  Procedure: The patient had oriented localization wire placed in the left breast. She was taken to the operating room and identified as the correct patient. She was placed on the operating room table and general anesthesia was induced. Her left breast and axilla were then prepped and draped in the usual sterile fashion. Performed [name] transverse incision on the breast including the localization wire. I took the dissection down into the breast tissue with the electrocautery. I then performed [name] wide lumpectomy going down to the chest wall. The entire specimen was removed. All margins were painted with marker paint. X-ray confirmed that the clip was in the specimen. I then placed several surgical clips around the biopsy cavity. Hemostasis was achieved with cautery. I irrigated with saline. Next I made [name] transverse incision in the patient's axilla. I took the dissection down into the axillary tissue left side with the electrocautery. I identified the  pectoralis muscle and axillary vein. I then removed all nodal tissue with electrocautery as well as surgical clips. The long thoracic nerve and thoracodorsal nerve were identified and spared. The entire nodal package and fatty content was then completely excised and removed from the axilla. Hemostasis appeared to be achieved. I thoroughly irrigated the axilla with saline. I then made [name] separate skin incision and inserted [name] [address] [place] drain into the axilla. This was sutured in place with [name] nylon suture. I then closed both incisions with 3-0 Vicryl sutures and running 4-0 Monocryl subcuticular sutures. Steri-Strips, gauze, and tape were applied. The patient tolerated the procedure well. All of the counts were correct at the end of the procedure. The patient was then extubated in the operating room and taken in [name] stable condition to the recovery room.          [name]   Date: 07/16/2011  Time: 3:37 PM

## 2011-07-16 NOTE — Progress Notes (Signed)
Received patient from PACU post left breast lumpectomy, dressing intact,vital signs stable. Will continue to monitor patient.

## 2011-07-16 NOTE — Interval H&P Note (Signed)
History and Physical Interval Note:  No change in history or exam  07/16/2011 12:52 PM  [name]  has presented today for surgery, with the diagnosis of left breast cancer  The various methods of treatment have been discussed with the patient and family. After consideration of risks, benefits and other options for treatment, the patient has consented to  Procedure(s) (LRB): BREAST LUMPECTOMY WITH NEDLE LOCALIZATION AND AXILLARY LYMPH NODE DISSECTION (Left) as a surgical intervention .  The patients' history has been reviewed, patient examined, no change in status, stable for surgery.  I have reviewed the patients' chart and labs.  Questions were answered to the patient's satisfaction.     [name] A

## 2011-07-16 NOTE — Preoperative (Signed)
Beta Blockers   Reason not to administer Beta Blockers:Not Applicable, took Coreg this am 

## 2011-07-16 NOTE — Anesthesia Preprocedure Evaluation (Addendum)
Anesthesia Evaluation  Patient identified by MRN, date of birth, ID band Patient awake    Reviewed: Allergy & Precautions, H&P , NPO status , Patient's Chart, lab work & pertinent test results  Airway Mallampati: II TM Distance: >3 FB Neck ROM: Full    Dental No notable dental hx.    Pulmonary sleep apnea , COPD No CPAP breath sounds clear to auscultation  Pulmonary exam normal       Cardiovascular hypertension, Pt. on home beta blockers and Pt. on medications + angina + Past MI and +CHF + Valvular Problems/Murmurs Rhythm:Regular Rate:Normal  Does have angina. Last NTG was about one month ago. Dr. Mayford Knife is her cardiologist. Has seen her recently. ECG reviewed: borderline TWA inferiorly.   Neuro/Psych PSYCHIATRIC DISORDERS Depression negative neurological ROS     GI/Hepatic Neg liver ROS, GERD-  Medicated,  Endo/Other  Hypothyroidism   Renal/GU negative Renal ROS  negative genitourinary   Musculoskeletal negative musculoskeletal ROS (+)   Abdominal (+) + obese,   Peds negative pediatric ROS (+)  Hematology negative hematology ROS (+)   Anesthesia Other Findings   Reproductive/Obstetrics negative OB ROS                          Anesthesia Physical Anesthesia Plan  ASA: III  Anesthesia Plan: General   Post-op Pain Management:    Induction: Intravenous  Airway Management Planned: LMA  Additional Equipment:   Intra-op Plan:   Post-operative Plan: Extubation in OR  Informed Consent: I have reviewed the patients History and Physical, chart, labs and discussed the procedure including the risks, benefits and alternatives for the proposed anesthesia with the patient or authorized representative who has indicated his/her understanding and acceptance.   Dental advisory given  Plan Discussed with: CRNA  Anesthesia Plan Comments: (Took prilosec, carvedilol, oxycodone, zoloft this AM.)        Anesthesia Quick Evaluation

## 2011-07-16 NOTE — Anesthesia Postprocedure Evaluation (Signed)
Anesthesia Post Note  Patient: [name]  Procedure(s) Performed: Procedure(s) (LRB): BREAST LUMPECTOMY WITH NEDLE LOCALIZATION AND AXILLARY LYMPH NODE DISSECTION (Left)  Anesthesia type: General  Patient location: PACU  Post pain: Pain level controlled and Adequate analgesia  Post assessment: Post-op Vital signs reviewed, Patient's Cardiovascular Status Stable, Respiratory Function Stable, Patent Airway and Pain level controlled  Last Vitals:  Filed Vitals:   07/16/11 1700  BP: 112/53  Pulse: 70  Temp:   Resp: 9    Post vital signs: Reviewed and stable  Level of consciousness: awake, alert  and oriented  Complications: No apparent anesthesia complications

## 2011-07-16 NOTE — Transfer of Care (Signed)
Immediate Anesthesia Transfer of Care Note  Patient: [name]  Procedure(s) Performed: Procedure(s) (LRB): BREAST LUMPECTOMY WITH NEDLE LOCALIZATION AND AXILLARY LYMPH NODE DISSECTION (Left)  Patient Location: PACU  Anesthesia Type: General  Level of Consciousness: awake, oriented and patient cooperative  Airway & Oxygen Therapy: Patient Spontanous Breathing and Patient connected to nasal cannula oxygen  Post-op Assessment: Report given to PACU RN and Post -op Vital signs reviewed and stable  Post vital signs: Reviewed and stable  Complications: No apparent anesthesia complications

## 2011-07-17 DIAGNOSIS — C773 Secondary and unspecified malignant neoplasm of axilla and upper limb lymph nodes: Secondary | ICD-10-CM | POA: Diagnosis not present

## 2011-07-17 DIAGNOSIS — D0590 Unspecified type of carcinoma in situ of unspecified breast: Secondary | ICD-10-CM | POA: Diagnosis not present

## 2011-07-17 DIAGNOSIS — C50919 Malignant neoplasm of unspecified site of unspecified female breast: Secondary | ICD-10-CM | POA: Diagnosis not present

## 2011-07-17 DIAGNOSIS — Z01812 Encounter for preprocedural laboratory examination: Secondary | ICD-10-CM | POA: Diagnosis not present

## 2011-07-17 DIAGNOSIS — J449 Chronic obstructive pulmonary disease, unspecified: Secondary | ICD-10-CM | POA: Diagnosis not present

## 2011-07-17 DIAGNOSIS — G4730 Sleep apnea, unspecified: Secondary | ICD-10-CM | POA: Diagnosis not present

## 2011-07-17 MED ORDER — OXYCODONE-ACETAMINOPHEN 5-325 MG PO TABS: 1.0000 | ORAL_TABLET | ORAL | Status: AC | PRN

## 2011-07-17 MED ORDER — ACETAMINOPHEN 325 MG PO TABS
325.0000 mg | ORAL_TABLET | Freq: Once | ORAL | Status: AC
  Administered 2011-07-17: 325 mg via ORAL
  Filled 2011-07-17: qty 1

## 2011-07-17 NOTE — Progress Notes (Signed)
Called Dr. Magnus Ivan regarding pt's temperature of 101.6. Received order for Tylenol.

## 2011-07-17 NOTE — Discharge Instructions (Signed)
[facility] °Office Phone Number [phone] ° °BREAST BIOPSY/ PARTIAL MASTECTOMY: POST OP INSTRUCTIONS ° °Always review your discharge instruction sheet given to you by the facility where your surgery was performed. ° °IF YOU HAVE DISABILITY OR FAMILY LEAVE FORMS, YOU MUST BRING THEM TO THE OFFICE FOR PROCESSING.  DO NOT GIVE THEM TO YOUR DOCTOR. ° °1. A prescription for pain medication may be given to you upon discharge.  Take your pain medication as prescribed, if needed.  If narcotic pain medicine is not needed, then you may take acetaminophen (Tylenol) or ibuprofen (Advil) as needed. °2. Take your usually prescribed medications unless otherwise directed °3. If you need a refill on your pain medication, please contact your pharmacy.  They will contact our office to request authorization.  Prescriptions will not be filled after 5pm or on week-ends. °4. You should eat very light the first 24 hours after surgery, such as soup, crackers, pudding, etc.  Resume your normal diet the day after surgery. °5. Most patients will experience some swelling and bruising in the breast.  Ice packs and a good support bra will help.  Swelling and bruising can take several days to resolve.  °6. It is common to experience some constipation if taking pain medication after surgery.  Increasing fluid intake and taking a stool softener will usually help or prevent this problem from occurring.  A mild laxative (Milk of Magnesia or Miralax) should be taken according to package directions if there are no bowel movements after 48 hours. °7. Unless discharge instructions indicate otherwise, you may remove your bandages 24-48 hours after surgery, and you may shower at that time.  You may have steri-strips (small skin tapes) in place directly over the incision.  These strips should be left on the skin for 7-10 days.  If your surgeon used skin glue on the incision, you may shower in 24 hours.  The glue will flake off over the  next 2-3 weeks.  Any sutures or staples will be removed at the office during your follow-up visit. °8. ACTIVITIES:  You may resume regular daily activities (gradually increasing) beginning the next day.  Wearing a good support bra or sports bra minimizes pain and swelling.  You may have sexual intercourse when it is comfortable. °a. You may drive when you no longer are taking prescription pain medication, you can comfortably wear a seatbelt, and you can safely maneuver your car and apply brakes. °b. RETURN TO WORK:  ______________________________________________________________________________________ °9. You should see your doctor in the office for a follow-up appointment approximately two weeks after your surgery.  Your doctor’s nurse will typically make your follow-up appointment when she calls you with your pathology report.  Expect your pathology report 2-3 business days after your surgery.  You may call to check if you do not hear from us after three days. °10. OTHER INSTRUCTIONS: _______________________________________________________________________________________________ _____________________________________________________________________________________________________________________________________ °_____________________________________________________________________________________________________________________________________ °_____________________________________________________________________________________________________________________________________ ° °WHEN TO CALL YOUR DOCTOR: °1. Fever over 101.0 °2. Nausea and/or vomiting. °3. Extreme swelling or bruising. °4. Continued bleeding from incision. °5. Increased pain, redness, or drainage from the incision. ° °The clinic staff is available to answer your questions during regular business hours.  Please don’t hesitate to call and ask to speak to one of the nurses for clinical concerns.  If you have a medical emergency, go to the nearest  emergency room or call 911.  A surgeon from [facility] is always on call at the hospital. ° °For further questions, please visit [email or web address]  °

## 2011-07-17 NOTE — Discharge Summary (Signed)
Physician Discharge Summary  Patient ID: [name] MRN: [id number] DOB/AGE: October 20, 1960 51 y.o.  Admit date: 07/16/2011 Discharge date: 07/17/2011  Admission Diagnoses:  Left breast cancer  Discharge Diagnoses: same Active Problems:  * No active hospital problems. *    Discharged Condition: good  Hospital Course: admitted post op.  No problems.  Discharged POD#1  Consults: None  Significant Diagnostic Studies:   Treatments: surgery: left breast needle localized lumpectomy and complete axillary dissection  Discharge Exam: Blood pressure 95/50, pulse 92, temperature 100.6 F (38.1 C), temperature source Oral, resp. rate 16, SpO2 98.00%. General appearance: alert and no distress Chest wall: no tenderness Incision/Wound: dressings dry  Disposition: 01-Home or Self Care  Discharge Orders    Future Appointments: Provider: Department: Dept Phone: Center:   08/20/2011 1:00 PM Sherrie Mustache [facility] [phone] None   08/20/2011 1:30 PM Pierce Crane, MD [facility] [phone] None     Medication List  As of 07/17/2011  6:37 AM   TAKE these medications         ADVIL PM 200-25 MG Caps   Generic drug: Ibuprofen-Diphenhydramine HCl   Take 3 capsules by mouth at bedtime.      carvedilol 25 MG tablet   Commonly known as: COREG   Take 25 mg by mouth 2 (two) times daily with a meal.      furosemide 40 MG tablet   Commonly known as: LASIX   Take 80-120 mg by mouth daily. 120 mg in the am and 80 mg in the pm.      levothyroxine 100 MCG tablet   Commonly known as: SYNTHROID, LEVOTHROID   Take 100 mcg by mouth daily.      lidocaine-prilocaine cream   Commonly known as: EMLA   Apply 1 application topically as needed. For pain      lisinopril 20 MG tablet   Commonly known as: PRINIVIL,ZESTRIL   Take 20 mg by mouth daily.      magic mouthwash w/lidocaine Soln   Take 15 mLs by mouth 4 (four) times daily as needed. For mouth soreness      magnesium oxide 400 MG  tablet   Commonly known as: MAG-OX   Take 800 mg by mouth daily.      Melatonin 10 MG Caps   Take 40 mg by mouth at bedtime.      nitroGLYCERIN 0.4 MG SL tablet   Commonly known as: NITROSTAT   Place 0.4 mg under the tongue every 5 (five) minutes as needed. As needed for chest pain.      omeprazole 20 MG capsule   Commonly known as: PRILOSEC   Take 20 mg by mouth 2 (two) times daily.      OVER THE COUNTER MEDICATION   Take 3 capsules by mouth every morning. Green Tea Extract OTC.      oxyCODONE-acetaminophen 5-325 MG per tablet   Commonly known as: PERCOCET   Take 1 tablet by mouth 2 (two) times daily as needed. As needed for pain.      oxyCODONE-acetaminophen 5-325 MG per tablet   Commonly known as: PERCOCET   Take 1 tablet by mouth every 4 (four) hours as needed for pain.      potassium chloride SA 20 MEQ tablet   Commonly known as: K-DUR,KLOR-CON   Take 20 mEq by mouth 2 (two) times daily.      sertraline 100 MG tablet   Commonly known as: ZOLOFT   Take 100 mg by  mouth daily.      sertraline 50 MG tablet   Commonly known as: ZOLOFT   Take 50-150 mg by mouth daily as needed. Patient may take and additional 50 mg if she feels she is having a bad day      tobramycin-dexamethasone ophthalmic solution   Commonly known as: TOBRADEX   Place 1 drop into both eyes every 4 (four) hours while awake.      zolpidem 10 MG tablet   Commonly known as: AMBIEN   Take 10 mg by mouth at bedtime.           Follow-up Information    Follow up with [facility] A, MD. Call on 07/23/2011. [phone])    Contact information:   [facility] [address] [place] [address] [phone]          Signed: Shelly Rubenstein 07/17/2011, 6:37 AM

## 2011-07-17 NOTE — Progress Notes (Signed)
Patient ID: [name], female   DOB: 03/09/1961, 51 y.o.   MRN: [id number] POD#1  Doing well Drain serosang No hematoma   Plan:  discharge

## 2011-07-17 NOTE — Plan of Care (Signed)
Problem: Discharge Progression Outcomes Goal: Steri-Strips applied Outcome: Not Applicable Date Met:  07/17/11 ALREADY EXISTS

## 2011-07-19 MED FILL — Hydromorphone HCl Inj 1 MG/ML: INTRAMUSCULAR | Qty: 1 | Status: AC

## 2011-07-19 NOTE — Progress Notes (Signed)
UR complete 

## 2011-07-26 ENCOUNTER — Ambulatory Visit (INDEPENDENT_AMBULATORY_CARE_PROVIDER_SITE_OTHER): Payer: Medicare Other | Admitting: Surgery

## 2011-07-26 ENCOUNTER — Encounter (INDEPENDENT_AMBULATORY_CARE_PROVIDER_SITE_OTHER): Payer: Self-pay | Admitting: Surgery

## 2011-07-26 VITALS — BP 118/86 | HR 78 | Temp 97.2°F | Resp 16 | Ht <= 58 in | Wt 195.8 lb

## 2011-07-26 DIAGNOSIS — M79629 Pain in unspecified upper arm: Secondary | ICD-10-CM | POA: Insufficient documentation

## 2011-07-26 DIAGNOSIS — C50919 Malignant neoplasm of unspecified site of unspecified female breast: Secondary | ICD-10-CM

## 2011-07-26 MED ORDER — GABAPENTIN 100 MG PO CAPS: 100.0000 mg | ORAL_CAPSULE | Freq: Three times a day (TID) | ORAL | Status: DC | PRN

## 2011-07-26 MED ORDER — HYDROCODONE-ACETAMINOPHEN 5-325 MG PO TABS: 1.0000 | ORAL_TABLET | Freq: Four times a day (QID) | ORAL | Status: DC | PRN

## 2011-07-26 MED ORDER — TRAMADOL HCL 50 MG PO TABS: 50.0000 mg | ORAL_TABLET | Freq: Four times a day (QID) | ORAL | Status: DC | PRN

## 2011-07-26 NOTE — Progress Notes (Signed)
Subjective:     Patient ID: [name], female   DOB: [birth year], 51 y.o.   MRN: [id number]  HPI  [name]  07-May-1960 [id number]  Patient Care Team: Lonie Peak as PCP - General (Physician Assistant) Shelly Rubenstein, MD as Consulting Physician (General Surgery) Jonna Coup, MD as Consulting Physician (Radiation Oncology)  This patient is a 51 y.o.female who presents today for surgical evaluation.   Procedure: Left wide local excision/lumpectomy and axillary lymph node dissection 16 July 2011  Patient comes in here today feeling sore. Lot of numbness/pain in her left breast especially her arm Afraid to use her arm. Drain output is less than an ounce a day. Showering okay. Oxycodone and ibuprofen "not touching it". Tearful and anxious but consolable  Patient Active Problem List  Diagnoses  . Breast cancer  . Obesity  . Anemia  . Upper arm pain, left    Past Medical History  Diagnosis Date  . Depression   . Hypothyroidism   . Congestive heart failure   . Emphysema of lung   . GERD (gastroesophageal reflux disease)   . Heart murmur   . Hypertension   . Anemia 05/19/2011  . Nonischemic cardiomyopathy     Cardiologist Dr. Armanda Magic  . Myocardial infarction 2003  . Pneumonia   . History of bronchitis   . Breast cancer 06/2011    left breast  . Shortness of breath 07/16/11    "at all times"  . Blood transfusion   . Chronic lower back pain     Past Surgical History  Procedure Date  . Portacath placement 02/26/2011    Procedure: INSERTION PORT-A-CATH;  Surgeon: Shelly Rubenstein, MD;  Location: [facility] OR;  Service: General;  Laterality: Right;  . Breast biopsy     left breast  . Breast lumpectomy 07/16/11    w/LND; left  . Abdominal hysterectomy ~2006    partial  . Tubal ligation 1991    History   Social History  . Marital Status: Divorced    Spouse Name: N/A    Number of Children: N/A  . Years of Education: N/A   Occupational History  . Not on  file.   Social History Main Topics  . Smoking status: Former Smoker -- 1.0 packs/day for 40 years    Types: Cigarettes    Quit date: 02/20/2011  . Smokeless tobacco: Never Used  . Alcohol Use: 0.0 oz/week     07/16/11 "wine coolers; eve  . Drug Use: No  . Sexually Active: No   Other Topics Concern  . Not on file   Social History Narrative  . No narrative on file    Family History  Problem Relation Age of Onset  . Leukemia Mother   . Heart disease Maternal Grandmother     Current Outpatient Prescriptions  Medication Sig Dispense Refill  . Alum & Mag Hydroxide-Simeth (MAGIC MOUTHWASH W/LIDOCAINE) SOLN Take 15 mLs by mouth 4 (four) times daily as needed. For mouth soreness      . carvedilol (COREG) 25 MG tablet Take 25 mg by mouth 2 (two) times daily with a meal.        . furosemide (LASIX) 40 MG tablet Take 80-120 mg by mouth daily. 120 mg in the am and 80 mg in the pm.      . Ibuprofen-Diphenhydramine HCl (ADVIL PM) 200-25 MG CAPS Take 3 capsules by mouth at bedtime.        [name] levothyroxine (SYNTHROID, LEVOTHROID)  100 MCG tablet Take 100 mcg by mouth daily.        [name] lidocaine-prilocaine (EMLA) cream Apply 1 application topically as needed. For pain      . lisinopril (PRINIVIL,ZESTRIL) 20 MG tablet Take 20 mg by mouth daily.        . magnesium oxide (MAG-OX) 400 MG tablet Take 800 mg by mouth daily.        . Melatonin 10 MG CAPS Take 40 mg by mouth at bedtime.      . nitroGLYCERIN (NITROSTAT) 0.4 MG SL tablet Place 0.4 mg under the tongue every 5 (five) minutes as needed. As needed for chest pain.      [name] omeprazole (PRILOSEC) 20 MG capsule Take 20 mg by mouth 2 (two) times daily.       [name] OVER THE COUNTER MEDICATION Take 3 capsules by mouth every morning. Green Tea Extract OTC.       [name] oxyCODONE-acetaminophen (PERCOCET) 5-325 MG per tablet Take 1 tablet by mouth 2 (two) times daily as needed. As needed for pain.      [name] oxyCODONE-acetaminophen (ROXICET) 5-325 MG per tablet Take 1  tablet by mouth every 4 (four) hours as needed for pain.  40 tablet  0  . potassium chloride SA (K-DUR,KLOR-CON) 20 MEQ tablet Take 20 mEq by mouth 2 (two) times daily.      . sertraline (ZOLOFT) 100 MG tablet Take 100 mg by mouth daily.        . sertraline (ZOLOFT) 50 MG tablet Take 50-150 mg by mouth daily as needed. Patient may take and additional 50 mg if she feels she is having a bad day      . tobramycin-dexamethasone (TOBRADEX) ophthalmic solution Place 1 drop into both eyes every 4 (four) hours while awake.        . zolpidem (AMBIEN) 10 MG tablet Take 10 mg by mouth at bedtime.       . gabapentin (NEURONTIN) 100 MG capsule Take 1-2 capsules (100-200 mg total) by mouth 3 (three) times daily as needed.  30 capsule  2  . HYDROcodone-acetaminophen (NORCO) 5-325 MG per tablet Take 1-2 tablets by mouth every 6 (six) hours as needed for pain.  30 tablet  1  . traMADol (ULTRAM) 50 MG tablet Take 1-2 tablets (50-100 mg total) by mouth every 6 (six) hours as needed for pain.  30 tablet  1   No current facility-administered medications for this visit.   Facility-Administered Medications Ordered in Other Visits  Medication Dose Route Frequency Provider Last Rate Last Dose  . sodium chloride 0.9 % injection 10 mL  10 mL Intracatheter PRN Lowella Dell, MD         Allergies  Allergen Reactions  . Aspirin Hives  . Docetaxel Other (See Comments)    Chest tightness and pain  . Morphine And Related Hives and Swelling  . Penicillins Hives    BP 118/86  Pulse 78  Temp(Src) 97.2 F (36.2 C) (Temporal)  Resp 16  Ht 4' 9.5" (1.461 m)  Wt 195 lb 12.8 oz (88.814 kg)  BMI 41.64 kg/m2     Review of Systems  Constitutional: Negative for fever, chills and diaphoresis.  HENT: Negative for ear pain, sore throat and trouble swallowing.   Eyes: Negative for photophobia and visual disturbance.  Respiratory: Negative for cough, choking and shortness of breath.   Cardiovascular: Negative for chest  pain and palpitations.  Gastrointestinal: Negative for nausea, vomiting, abdominal pain, diarrhea, constipation,  anal bleeding and rectal pain.  Genitourinary: Negative for dysuria, frequency and difficulty urinating.  Musculoskeletal: Positive for myalgias. Negative for gait problem.  Skin: Negative for color change, pallor and rash.  Neurological: Negative for dizziness, speech difficulty, weakness and numbness.  Hematological: Negative for adenopathy.  Psychiatric/Behavioral: Negative for confusion and agitation. The patient is not nervous/anxious.        Objective:   Physical Exam  Constitutional: She is oriented to person, place, and time. She appears well-developed and well-nourished. No distress.  HENT:  Head: Normocephalic.  Mouth/Throat: Oropharynx is clear and moist. No oropharyngeal exudate.  Eyes: Conjunctivae and EOM are normal. Pupils are equal, round, and reactive to light. No scleral icterus.  Neck: Normal range of motion. No tracheal deviation present.  Cardiovascular: Normal rate and intact distal pulses.   Pulmonary/Chest: Effort normal. No respiratory distress. She exhibits no tenderness.    Abdominal: Soft. She exhibits no distension. There is no tenderness. Hernia confirmed negative in the right inguinal area and confirmed negative in the left inguinal area.       Incisions clean with normal healing ridges.  No hernias  Genitourinary: No vaginal discharge found.  Musculoskeletal: Normal range of motion. She exhibits no tenderness.  Lymphadenopathy:       Right: No inguinal adenopathy present.       Left: No inguinal adenopathy present.  Neurological: She is alert and oriented to person, place, and time. No cranial nerve deficit. She exhibits normal muscle tone. Coordination normal.  Skin: Skin is warm and dry. No rash noted. She is not diaphoretic.  Psychiatric: She has a normal mood and affect. Her behavior is normal.       Assessment:     POD# 10 Healing  well but probable paresthesias/pain     Plan:     I removed the drain. It stung to remove it but then she calmed down. She was quite tearful and anxious around the removal. She was more consolable near the end.  I stressed to her she need to start using the arm. She Holding it to her side and was afraid to move it. I tried to encourage her to use ice and heat. I wrote to switch her to tramadol to see if that would work better for her. Also some Neurontin p.r.n. for any paresthesia type pain.  I tried to reassure there is no evidence of infection and everything was healing up. We gave her handout on using her upper extremity to help deal with the stiffness and discomfort. In the end she seemed reassured.  Continue her antidepressants. Continue to work on controlling her anxiety better.

## 2011-08-02 ENCOUNTER — Ambulatory Visit (INDEPENDENT_AMBULATORY_CARE_PROVIDER_SITE_OTHER): Payer: Medicare Other | Admitting: Surgery

## 2011-08-02 ENCOUNTER — Encounter (INDEPENDENT_AMBULATORY_CARE_PROVIDER_SITE_OTHER): Payer: Self-pay | Admitting: Surgery

## 2011-08-02 VITALS — BP 114/66 | HR 82 | Temp 98.7°F | Resp 18 | Ht <= 58 in | Wt 195.0 lb

## 2011-08-02 DIAGNOSIS — Z09 Encounter for follow-up examination after completed treatment for conditions other than malignant neoplasm: Secondary | ICD-10-CM

## 2011-08-02 NOTE — Progress Notes (Signed)
Subjective:     Patient ID: [name], female   DOB: 01-11-1961, 50 y.o.   MRN: [id number]  HPI She is here for another postoperative visit status post left breast lumpectomy and complete axillary lymph node dissection. She is improving with her discomfort on the tramadol and Neurontin.  Review of Systems     Objective:   Physical Exam Her incisions are well-healed. There is no arm swelling.  The final pathology showed there was 2 cm of residual cancer in the breast. Margins were negative. All the lymph nodes in the axilla were negative    Assessment:     Patient status post lumpectomy and complete axillar dissection after neoadjuvant therapy for left breast cancer    Plan:     I will see her back in one month. I reviewed the tramadol and Neurontin. She will now need followup again with her medical and radiation oncologist

## 2011-08-06 ENCOUNTER — Other Ambulatory Visit: Payer: Self-pay | Admitting: Oncology

## 2011-08-17 ENCOUNTER — Other Ambulatory Visit (HOSPITAL_COMMUNITY)
Admission: RE | Admit: 2011-08-17 | Discharge: 2011-08-17 | Disposition: A | Discharge status: Home / Self Care | Payer: Medicare Other | Source: Ambulatory Visit | Attending: Oncology | Admitting: Oncology

## 2011-08-17 DIAGNOSIS — C50919 Malignant neoplasm of unspecified site of unspecified female breast: Secondary | ICD-10-CM | POA: Diagnosis not present

## 2011-08-19 ENCOUNTER — Encounter: Payer: Self-pay | Admitting: *Deleted

## 2011-08-20 ENCOUNTER — Ambulatory Visit: Payer: Medicare Other | Admitting: Oncology

## 2011-08-20 ENCOUNTER — Encounter: Payer: Self-pay | Admitting: *Deleted

## 2011-08-20 ENCOUNTER — Ambulatory Visit (HOSPITAL_BASED_OUTPATIENT_CLINIC_OR_DEPARTMENT_OTHER): Payer: Medicare Other | Admitting: Oncology

## 2011-08-20 ENCOUNTER — Other Ambulatory Visit (HOSPITAL_BASED_OUTPATIENT_CLINIC_OR_DEPARTMENT_OTHER): Payer: Medicare Other | Admitting: Lab

## 2011-08-20 ENCOUNTER — Ambulatory Visit
Admission: RE | Admit: 2011-08-20 | Discharge: 2011-08-20 | Disposition: A | Discharge status: Home / Self Care | Payer: Medicare Other | Source: Ambulatory Visit | Attending: Radiation Oncology | Admitting: Radiation Oncology

## 2011-08-20 ENCOUNTER — Other Ambulatory Visit: Payer: Medicare Other | Admitting: Lab

## 2011-08-20 ENCOUNTER — Encounter: Payer: Self-pay | Admitting: Radiation Oncology

## 2011-08-20 ENCOUNTER — Other Ambulatory Visit: Payer: Self-pay | Admitting: *Deleted

## 2011-08-20 ENCOUNTER — Telehealth: Payer: Self-pay | Admitting: *Deleted

## 2011-08-20 VITALS — BP 125/84 | HR 81 | Temp 99.2°F | Resp 20 | Wt 192.3 lb

## 2011-08-20 VITALS — BP 141/98 | HR 71 | Temp 98.5°F | Ht <= 58 in | Wt 190.9 lb

## 2011-08-20 DIAGNOSIS — C50919 Malignant neoplasm of unspecified site of unspecified female breast: Secondary | ICD-10-CM

## 2011-08-20 DIAGNOSIS — Z79899 Other long term (current) drug therapy: Secondary | ICD-10-CM | POA: Diagnosis not present

## 2011-08-20 DIAGNOSIS — Z510 Encounter for antineoplastic radiation therapy: Secondary | ICD-10-CM | POA: Diagnosis not present

## 2011-08-20 DIAGNOSIS — C773 Secondary and unspecified malignant neoplasm of axilla and upper limb lymph nodes: Secondary | ICD-10-CM

## 2011-08-20 DIAGNOSIS — E559 Vitamin D deficiency, unspecified: Secondary | ICD-10-CM

## 2011-08-20 DIAGNOSIS — I219 Acute myocardial infarction, unspecified: Secondary | ICD-10-CM | POA: Insufficient documentation

## 2011-08-20 DIAGNOSIS — F329 Major depressive disorder, single episode, unspecified: Secondary | ICD-10-CM

## 2011-08-20 DIAGNOSIS — C50419 Malignant neoplasm of upper-outer quadrant of unspecified female breast: Secondary | ICD-10-CM | POA: Diagnosis not present

## 2011-08-20 DIAGNOSIS — D649 Anemia, unspecified: Secondary | ICD-10-CM | POA: Diagnosis not present

## 2011-08-20 HISTORY — DX: Myoneural disorder, unspecified: G70.9

## 2011-08-20 HISTORY — DX: Hyperlipidemia, unspecified: E78.5

## 2011-08-20 LAB — CBC WITH DIFFERENTIAL/PLATELET
BASO%: 0.4 % (ref 0.0–2.0)
Basophils Absolute: 0 10*3/uL (ref 0.0–0.1)
HCT: 32 % — ABNORMAL LOW (ref 34.8–46.6)
LYMPH%: 41.2 % (ref 14.0–49.7)
MCHC: 32.7 g/dL (ref 31.5–36.0)
MONO#: 0.5 10*3/uL (ref 0.1–0.9)
NEUT%: 47.1 % (ref 38.4–76.8)
Platelets: 226 10*3/uL (ref 145–400)
WBC: 4.1 10*3/uL (ref 3.9–10.3)

## 2011-08-20 MED ORDER — TRAZODONE HCL 50 MG PO TABS: 50.0000 mg | ORAL_TABLET | Freq: Every day | ORAL | Status: DC

## 2011-08-20 NOTE — Progress Notes (Signed)
Hematology and Oncology Follow Up Visit  EVOLETT SOMARRIBA [id number] 1960-08-11 50 y.o. 06/29/11    HPI: Crystalyn is a 51 year old [facility] [place] woman with a locally advanced ER/PR positive at 99/2% respectively, HER-2 positive, cish ratio of 2.87, left breast carcinoma with biopsy-proven lymph node involvement. Initial staging breast MRI on 02/05/2011 showing a mass measuring 4.9 cm with multiple small nodules without fatty hila in the left axilla suspicious for metastatic disease. She completed 2 cycles of every 3 week neoadjuvant Taxotere/carboplatin, due to significant difficulty with the Taxotere component based i.e. significant PPE changes, and chest pain with her second infusion, her treatment course was switched to every 3 week IV CMF/Herceptin. Currently day 7 cycle 3/4 of every 3 week IV CMF, Herceptin held due to worsening left ventricular ejection fraction. 2. History of congestive heart failure, echocardiogram from 06/15/2011 revealing a left ventricular ejection fraction between 35 and 40%. Also on Lasix 120 mg in the a.m., 80 mg in the p.m. HCTZ 25 mg by mouth daily, lisinopril 20 mg by mouth daily, Coreg 25 mg by mouth twice a day 3. Hypothyroidism-on Synthroid 100 mcg daily 4. COPD 5. GERD on Prilosec 20 mg by mouth twice a day 6. Depression on Zoloft 100 mg by mouth daily 7. Reported history of myocardial infarction 7 years ago.  Interim History:   Ms. Dicocco is seen today with her daughter and accompaniment for followup after completion cycle 4/4 every 3 week IV CMF given in the neoadjuvant setting with Herceptin being held due to CHF. She underwent repeat breast MRI on 06/29/2011, of note she has been seen by Dr. Rayburn Ma and had surgery on 07/16/2011. We reviewed pathology which showed a residual 2 cm tumor, in  A background of scarring and fibrosis. 11 lymph nodes were all negative.Residual tumor was er, pr + and Her2+.she has had an unremarkable post op course. A detailed  review of systems is otherwise noncontributory as noted below.  Review of Systems: Constitutional: Fatigue. Eyes: excessive tearing ENT:  Cardiovasc complains of mouth soreness and "puffiness" but denies any difficulty swallowing per se. Cor: Denies shortness of breath. Respiratory: positive for - shortness of breath Neurological: no TIA or stroke symptoms Dermatological: negative Gastrointestinal: no abdominal pain, change in bowel habits, or black or bloody stools Genito-Urinary: no dysuria, trouble voiding, or hematuria Hematological and Lymphatic: Negative. Breast: negative for breast lumps Patient unable to palpate L breast mass. Musculoskeletal: negative Remaining ROS negative.   Medications:   I have reviewed the patient's current medications.  Current Outpatient Prescriptions  Medication Sig Dispense Refill  . carvedilol (COREG) 25 MG tablet Take 25 mg by mouth 2 (two) times daily with a meal.        . furosemide (LASIX) 40 MG tablet Take 80-120 mg by mouth daily. 120 mg in the am and 80 mg in the pm.      . gabapentin (NEURONTIN) 100 MG capsule Take 1-2 capsules (100-200 mg total) by mouth 3 (three) times daily as needed.  30 capsule  2  . Ibuprofen-Diphenhydramine HCl (ADVIL PM) 200-25 MG CAPS Take 3 capsules by mouth at bedtime.        [name] levothyroxine (SYNTHROID, LEVOTHROID) 100 MCG tablet Take 100 mcg by mouth daily.        [name] lidocaine-prilocaine (EMLA) cream Apply 1 application topically as needed. For pain      . lisinopril (PRINIVIL,ZESTRIL) 20 MG tablet Take 20 mg by mouth daily.        [name]  LORazepam (ATIVAN) 0.5 MG tablet TAKE ONE TABLET BY MOUTH TWICE DAILY AS NEEDED  60 tablet  0  . magnesium oxide (MAG-OX) 400 MG tablet Take 800 mg by mouth daily.        . Melatonin 10 MG CAPS Take 40 mg by mouth at bedtime.      . nitroGLYCERIN (NITROSTAT) 0.4 MG SL tablet Place 0.4 mg under the tongue every 5 (five) minutes as needed. As needed for chest pain.      [name] omeprazole  (PRILOSEC) 20 MG capsule Take 20 mg by mouth 2 (two) times daily.       [name] OVER THE COUNTER MEDICATION Take 3 capsules by mouth every morning. Green Tea Extract OTC.       [name] oxyCODONE-acetaminophen (PERCOCET) 5-325 MG per tablet Take 1 tablet by mouth 2 (two) times daily as needed. As needed for pain.      . potassium chloride SA (K-DUR,KLOR-CON) 20 MEQ tablet Take 20 mEq by mouth 2 (two) times daily.      . sertraline (ZOLOFT) 100 MG tablet Take 100 mg by mouth daily.        . sertraline (ZOLOFT) 50 MG tablet Take 50-150 mg by mouth daily as needed. Patient may take and additional 50 mg if she feels she is having a bad day      . zolpidem (AMBIEN) 10 MG tablet Take 10 mg by mouth at bedtime.        No current facility-administered medications for this visit.   Facility-Administered Medications Ordered in Other Visits  Medication Dose Route Frequency Provider Last Rate Last Dose  . sodium chloride 0.9 % injection 10 mL  10 mL Intracatheter PRN Lowella Dell, MD        Allergies:  Allergies  Allergen Reactions  . Aspirin Hives  . Docetaxel Other (See Comments)    Chest tightness and pain  . Morphine And Related Hives and Swelling  . Penicillins Hives    Physical Exam: Filed Vitals:   08/20/11 1029  BP: 141/98  Pulse: 71  Temp: 98.5 F (36.9 C)  Weight: 197 lbs. HEENT:  Sclerae Slightly injected but no evidence of any today., conjunctivae pink,  Oropharynx shows evidence of possible blistering which is very vague in the buccal mucosa. Question viral etiology.  No frank evidence to suggest oral mucositis or candidiasis.   Nodes:  No cervical, supraclavicular, or axillary lymphadenopathy palpated.  Breast Exam: lt breast , s/p lumpectomy with well healed scar Lungs: Clear to auscultation bilaterally.  No crackles, rhonchi, or wheezes.   Heart:  Regular rate and rhythm, mild right-sided JVD. Abdomen:  Soft, nontender.  Positive bowel sounds.  No organomegaly or masses palpated.     Musculoskeletal:  No focal spinal tenderness to palpation, she does have some tenderness over her left shoulder region, no evidence of any palpable cords erythema or lymphedema.  Extremities:  Benign.  No peripheral edema or cyanosis.   Skin:  Benign.   Neuro:  Nonfocal, alert and oriented x 3.   Lab Results: Lab Results  Component Value Date   WBC 4.1 08/20/2011   HGB 10.5* 08/20/2011   HCT 32.0* 08/20/2011   MCV 91.1 08/20/2011   PLT 226 08/20/2011   NEUTROABS 2.0 08/20/2011     Chemistry      Component Value Date/Time   NA 142 07/06/2011 1433   K 4.0 07/06/2011 1433   CL 103 07/06/2011 1433   CO2 28 07/06/2011 1433  BUN 16 07/06/2011 1433   CREATININE 1.34* 07/06/2011 1433      Component Value Date/Time   CALCIUM 9.5 07/06/2011 1433   ALKPHOS 79 06/22/2011 1106   AST 21 06/22/2011 1106   ALT 15 06/22/2011 1106   BILITOT 0.4 06/22/2011 1106     Echocardiogram: 06/15/11 Study Conclusions - Left ventricle: The cavity size was normal. Systolic function was moderately reduced. The estimated ejection fraction was in the range of 35% to 40%. Diffuse hypokinesis. - Ventricular septum: Septal motion showed abnormal function and dyssynergy. Transthoracic echocardiography. M-mode, complete 2D, spectral Doppler, and color Doppler. Patient status: Inpatient. Location: Echo laboratory. Prepared and Electronically Authenticated by Everette Rank, MD     Assessment:  Tavon is a 51 year old [facility] [place] woman with a locally advanced ER/PR positive at 99/2% respectively, HER-2 positive with the cish ratio of 2.87, left breast carcinoma with biopsy-proven lymph node involvement. Initial staging breast MRI on 02/05/2011 showing a mass measuring 4.9 cm with multiple small nodules without fatty hila in the left axilla suspicious for metastatic disease. She completed 2 cycles of every 3 week neoadjuvant Taxotere/carboplatin, due to significant difficulty with the Taxotere component based  i.e. significant PPE changes, and chest pain with her second infusion, her treatment course was switched to every 3 week IV CMF/Herceptin. Currently  day 1,cycle 4/4 of every 3 week IV CMF/Herceptin.   2. History of severe afebrile neutropenia despite Neulasta on day 2.  3. History of congestive heart failure, echocardiogram from 01/23 2013 revealing a left ventricular ejection fraction between 50 and 55%. Also on Lasix 120 mg in the a.m., 80 mg in the p.m. HCTZ 25 mg by mouth daily, lisinopril 20 mg by mouth daily, Coreg 25 mg by mouth twice a day. Worsening left ventricular ejection fraction reported between 35-40% on echocardiogram obtained on 06/15/2011. Patient is clinically asymptomatic.  4. Hypothyroidism-on Synthroid 100 mcg daily 5. COPD  6. GERD on Prilosec 20 mg by mouth twice a day  7. Depression on Zoloft 100 mg by mouth daily  8. History of symptomatic anemia, s/p 2 units of packed RBCs given on 05/21/2011.  Case has been reviewed with  Dr. Pierce Crane, who also examined patient.  Plan:  Edra is doing well, she has had a good response to treatrment, but unfortunately cannot take more herceptin.She has had a hysterectomy and has 1 ovary, so we will need to check her hormone levels before putting her on an AI. She has seen Dr Mitzi Hansen and will be starting xrt shortly. I will see her after, and determine which hormone to give her. She will need a bone density test as well. Her port can be removed after xrt has been completed. She is having problems sleeping despite what she is taking and I suggested trazodone.  Pierce Crane MD h Pierce Crane, 06/29/11

## 2011-08-20 NOTE — Telephone Encounter (Signed)
gave patient appointment for 10-20-2011 starting at 2:00pm withh labs printed out calendar and gave to the patient

## 2011-08-20 NOTE — Progress Notes (Signed)
Please see the Nurse Progress Note in the MD Initial Consult Encounter for this patient. 

## 2011-08-20 NOTE — Progress Notes (Signed)
Path:07/16/11: Left breast lumpectomy=Invasive ductal carcinoma,2cm,high grade ductal carcinoma in situ,(0/11) benign lymph nodes ER/PR=positive  Divorced,3 daughters  Alert oriented x3, no c/o pain, not working,  `  Allergies:Aspirin,Docetaxel=(tightness in chest and pain),Morphine and related,Pcns=all other= hives,swelling

## 2011-08-23 DIAGNOSIS — C50419 Malignant neoplasm of upper-outer quadrant of unspecified female breast: Secondary | ICD-10-CM | POA: Insufficient documentation

## 2011-08-23 NOTE — Progress Notes (Signed)
Radiation Oncology         [phone] ________________________________  Name: [name] MRN: [id number]  Date: 08/23/2011  DOB: [birth year]  Follow-Up Visit Note  CC: Lonie Peak, PA, PA  Shelly Rubenstein, MD  Ruthann Cancer, MD  Diagnosis:   Invasive ductal carcinoma of the left breast, clinically T2, N2, M0  Interval Since Last Radiation:  Not applicable   Narrative:  The patient returns today for routine follow-up.  She returns to clinic after she was seen preoperatively in multidisciplinary breast clinic. The patient since that time has undergone neoadjuvant chemotherapy followed by a left-sided breast lumpectomy and axillary lymph node dissection. The patient indicates that she didn't satisfactorily during the course of her treatment thus far and she has been healing well. The lumpectomy was completed on 07/16/2011. This revealed a 2 cm residual left sided invasive ductal carcinoma with associated high-grade DCIS. 11 lymph nodes were removed, none of which demonstrated carcinoma. The margins were negative. The tumor was suspicious for lymphovascular invasion. Receptor studies have indicated that the tumor is ER positive and PR positive, although weekly, as well as HER-2/neu positive. Again she indicates that she is doing well since her surgery and is feeling well and presents today for further discussion and coordination of a possible course of adjuvant radiation treatment                              ALLERGIES:  is allergic to aspirin; docetaxel; morphine and related; and penicillins.  Meds: Current Outpatient Prescriptions  Medication Sig Dispense Refill  . carvedilol (COREG) 25 MG tablet Take 25 mg by mouth 2 (two) times daily with a meal.        . furosemide (LASIX) 40 MG tablet Take 80-120 mg by mouth daily. 120 mg in the am and 80 mg in the pm.      . gabapentin (NEURONTIN) 100 MG capsule Take 1-2 capsules (100-200 mg total) by mouth 3 (three) times daily as needed.  30  capsule  2  . Ibuprofen-Diphenhydramine HCl (ADVIL PM) 200-25 MG CAPS Take 3 capsules by mouth at bedtime.        [name] levothyroxine (SYNTHROID, LEVOTHROID) 100 MCG tablet Take 100 mcg by mouth daily.        [name] lidocaine-prilocaine (EMLA) cream Apply 1 application topically as needed. For pain      . lisinopril (PRINIVIL,ZESTRIL) 20 MG tablet Take 20 mg by mouth daily.        [name] LORazepam (ATIVAN) 0.5 MG tablet TAKE ONE TABLET BY MOUTH TWICE DAILY AS NEEDED  60 tablet  0  . magnesium oxide (MAG-OX) 400 MG tablet Take 800 mg by mouth daily.        . Melatonin 10 MG CAPS Take 40 mg by mouth at bedtime.      [name] omeprazole (PRILOSEC) 20 MG capsule Take 20 mg by mouth 2 (two) times daily.       [name] OVER THE COUNTER MEDICATION Take 3 capsules by mouth every morning. Green Tea Extract OTC.       [name] oxyCODONE-acetaminophen (PERCOCET) 5-325 MG per tablet Take 1 tablet by mouth 2 (two) times daily as needed. As needed for pain.      . potassium chloride SA (K-DUR,KLOR-CON) 20 MEQ tablet Take 20 mEq by mouth 2 (two) times daily.      . sertraline (ZOLOFT) 100 MG tablet Take 100 mg by mouth daily.        [name]  sertraline (ZOLOFT) 50 MG tablet Take 50-150 mg by mouth daily as needed. Patient may take and additional 50 mg if she feels she is having a bad day      . zolpidem (AMBIEN) 10 MG tablet Take 10 mg by mouth at bedtime.       . nitroGLYCERIN (NITROSTAT) 0.4 MG SL tablet Place 0.4 mg under the tongue every 5 (five) minutes as needed. As needed for chest pain.      . traZODone (DESYREL) 50 MG tablet Take 1 tablet (50 mg total) by mouth at bedtime.  30 tablet  1   No current facility-administered medications for this encounter.   Facility-Administered Medications Ordered in Other Encounters  Medication Dose Route Frequency Provider Last Rate Last Dose  . sodium chloride 0.9 % injection 10 mL  10 mL Intracatheter PRN Lowella Dell, MD        Physical Findings: The patient is in no acute distress. Patient is  alert and oriented.  weight is 192 lb 4.8 oz (87.227 kg). Her oral temperature is 99.2 F (37.3 C). Her blood pressure is 125/84 and her pulse is 81. Her respiration is 20 and oxygen saturation is 99%. .   The patient's incisions are well-healed without any sign of infection. No significant arm swelling and no suspicious findings in the left axilla with no lymphadenopathy  Lab Findings: Lab Results  Component Value Date   WBC 4.1 08/20/2011   HGB 10.5* 08/20/2011   HCT 32.0* 08/20/2011   MCV 91.1 08/20/2011   PLT 226 08/20/2011     Radiographic Findings: No results found.  Impression:    51 year old female status post neoadjuvant chemotherapy and lumpectomy with axillary lymph node dissection. She initially had a locally advanced tumor and appears to have had a very nice response to chemotherapy with a 2 cm residual invasive ductal carcinoma and negative lymph nodes. This is a triple positive tumor.  Plan:  The patient is appropriate to proceed with a course of adjuvant radiotherapy. I discussed the rationale of this treatment with the patient. All of the patient's questions were answered and she is healing well. I would anticipate a 6-1/2 week course of treatment. Initially on her MRI scan in particular, the patient appeared to have multiple positive lymph nodes and she did have a positive lymph node biopsy prior to chemotherapy. The patient therefore presented with a locally advanced tumor. She has also had a very nice response to chemotherapy. Given her age and presentation, and my recent review once again of her initial imaging, I would favor treating her with a 4 field technique to cover the regional lymph nodes. Historically 4+ positive lymph nodes has been an indication for such a treatment. Even though the patient had a very nice response to chemotherapy, I would still treat her based on this initial presentation given the very worrisome MRI scan results in the left axilla with numerous  malignant appearing lymph nodes and given the context of her treatment with her being quite young at 51 years old. The patient will continue with Herceptin during her course of treatment.  The patient therefore will be scheduled for a simulation as soon as this can be arranged and I look forward to begin treatment planning for her case.  I spent 25 minutes with the patient today, the majority of which was spent counseling the patient on the diagnosis of cancer and coordinating care.   Radene Gunning, M.D., Ph.D.

## 2011-08-25 ENCOUNTER — Ambulatory Visit
Admission: RE | Admit: 2011-08-25 | Discharge: 2011-08-25 | Disposition: A | Discharge status: Home / Self Care | Payer: Medicare Other | Source: Ambulatory Visit | Attending: Radiation Oncology | Admitting: Radiation Oncology

## 2011-08-25 DIAGNOSIS — C50919 Malignant neoplasm of unspecified site of unspecified female breast: Secondary | ICD-10-CM | POA: Diagnosis not present

## 2011-08-25 DIAGNOSIS — C50419 Malignant neoplasm of upper-outer quadrant of unspecified female breast: Secondary | ICD-10-CM | POA: Diagnosis not present

## 2011-08-25 DIAGNOSIS — Z510 Encounter for antineoplastic radiation therapy: Secondary | ICD-10-CM | POA: Diagnosis not present

## 2011-08-25 DIAGNOSIS — Z79899 Other long term (current) drug therapy: Secondary | ICD-10-CM | POA: Diagnosis not present

## 2011-08-25 MED ORDER — OXYCODONE HCL 5 MG PO TABS: 5.0000 mg | ORAL_TABLET | ORAL | Status: AC

## 2011-08-25 MED ORDER — HYDROMORPHONE HCL PF 4 MG/ML IJ SOLN
1.0000 mg | Freq: Once | INTRAMUSCULAR | Status: DC
  Filled 2011-08-25: qty 1

## 2011-08-25 MED ORDER — HYDROMORPHONE HCL PF 4 MG/ML IJ SOLN
1.0000 mg | Freq: Once | INTRAMUSCULAR | Status: AC
  Administered 2011-08-25: 1 mg via INTRAMUSCULAR
  Filled 2011-08-25: qty 1

## 2011-08-25 NOTE — Progress Notes (Signed)
Per Dr.Moody, to give patient 1mg  Dilaudid IM, went to ct sim, asked patient name and dob, identified with chart as well, pat is allergic to Morphine, asked what occurs="Hives stated patient",pharmacy was called and relayed information to [place], ok to give , patients states pain a level "10", under left arm"feels like someone is popping my arm off", didn't take pain med this am stated patient. , 1 mg dilaudid checked with Alphia Moh prior to giving in patients left deltoid,1100am Rechecked patient at 1127am, patient facial relaxation visible, and pain level "4" stated patient, reminded patient no driving today, rx given to pt for  pain med by [facility], patient has daughter here to drive her home, tolerating lying on ct sim table with left arm 11:27 AM

## 2011-08-26 NOTE — Progress Notes (Addendum)
  Radiation Oncology         ([phone]) [phone] ________________________________  Name: [name] MRN: [id number]  Date: 08/25/2011  DOB: Oct 18, 1960   SIMULATION AND TREATMENT PLANNING NOTE  The patient presented for simulation prior to beginning her course of radiation treatment for her diagnosis of left-sided breast cancer. The patient was placed in a supine position on a breast board. A customized accuform device was also constructed, and this complex treatment device will be used on a daily basis during her treatment. In this fashion, a CT scan was obtained through the chest area and an isocenter was placed near the chest wall within the left breast. Both a free breathing scan and a breath-hold scan were both obtained to determine which technique was optimal, and to see if the breath-hold technique markedly improved the anticipated dose to the heart. This was the case. Therefore we will use this technique.  The patient will be planned to receive a course of radiation initially to a dose of 50.4 gray. This will consist of a 4 field technique to the left breast and left supraclavicular region. To accomplish this, 4 customized blocks have been designed which will correspond to medial and lateral whole breast tangent fields, as well as a left supraclavicular field and a left posterior axillary boost field. This treatment will be accomplished at 1.8 gray per fraction. A complex isodose plan is requested to ensure that the breast target area is adequately covered dosimetrically. A forward planning technique will also be evaluated to determine if this approach improves the plan for the tangent fields. It is anticipated that the patient will then receive a 16 gray boost to the seroma cavity which has been contoured. This will be accomplished at 2 gray per fraction. The final anticipated total dose therefore will correspond to 60.4 gray.  This initial treatment will consist of a 3-D conformal technique. The seroma  has been contoured as the primary target structure. Additionally, dose volume histograms of both this target as well as the lungs and heart will also be evaluated. Such an approach is necessary to ensure that the target area is adequately covered while the nearby critical normal structures are adequately spared.  Special treatment procedure  The patient will receive chemotherapy during the course of radiation treatment. The patient may experience increased or overlapping toxicity due to this combined-modality approach and the patient will be monitored for such problems. This may include extra lab work as necessary. This therefore constitutes a special treatment procedure. _______________________________   Radene Gunning, MD, PhD

## 2011-08-30 DIAGNOSIS — Z510 Encounter for antineoplastic radiation therapy: Secondary | ICD-10-CM | POA: Diagnosis not present

## 2011-08-30 DIAGNOSIS — Z79899 Other long term (current) drug therapy: Secondary | ICD-10-CM | POA: Diagnosis not present

## 2011-08-30 DIAGNOSIS — C50419 Malignant neoplasm of upper-outer quadrant of unspecified female breast: Secondary | ICD-10-CM | POA: Diagnosis not present

## 2011-08-30 DIAGNOSIS — C50919 Malignant neoplasm of unspecified site of unspecified female breast: Secondary | ICD-10-CM | POA: Diagnosis not present

## 2011-08-31 DIAGNOSIS — Z79899 Other long term (current) drug therapy: Secondary | ICD-10-CM | POA: Diagnosis not present

## 2011-08-31 DIAGNOSIS — C50919 Malignant neoplasm of unspecified site of unspecified female breast: Secondary | ICD-10-CM | POA: Diagnosis not present

## 2011-08-31 DIAGNOSIS — Z510 Encounter for antineoplastic radiation therapy: Secondary | ICD-10-CM | POA: Diagnosis not present

## 2011-09-01 ENCOUNTER — Encounter: Payer: Self-pay | Admitting: Radiation Oncology

## 2011-09-01 ENCOUNTER — Ambulatory Visit
Admission: RE | Admit: 2011-09-01 | Discharge: 2011-09-01 | Disposition: A | Discharge status: Home / Self Care | Payer: Medicare Other | Source: Ambulatory Visit | Attending: Radiation Oncology | Admitting: Radiation Oncology

## 2011-09-01 DIAGNOSIS — C50919 Malignant neoplasm of unspecified site of unspecified female breast: Secondary | ICD-10-CM

## 2011-09-01 DIAGNOSIS — Z79899 Other long term (current) drug therapy: Secondary | ICD-10-CM | POA: Diagnosis not present

## 2011-09-01 DIAGNOSIS — C50419 Malignant neoplasm of upper-outer quadrant of unspecified female breast: Secondary | ICD-10-CM

## 2011-09-01 DIAGNOSIS — Z510 Encounter for antineoplastic radiation therapy: Secondary | ICD-10-CM | POA: Diagnosis not present

## 2011-09-01 MED ORDER — HYDROMORPHONE HCL PF 1 MG/ML IJ SOLN
1.0000 mg | Freq: Once | INTRAMUSCULAR | Status: AC
  Administered 2011-09-01: 1 mg via INTRAMUSCULAR

## 2011-09-01 NOTE — Progress Notes (Signed)
Therapist escorted patient to clinic. Therapist reports that patient is unable to lay in treatment position for port films because of shoulder pain. Patient reports constant aching left shoulder 8 on a scale of 0-10 that has increases when she lays in the treatment position. Patient reports that she has had this pain since her shoulder surgery 4/26. Patient reports taking Percocet 5/325 one tablet and Oxy IR 5 mg two tablets prior to coming for treatment today. Administered Dilaudid 1 mg IM in her left deltoid as ordered by Dr. Kathrynn Running. Patient tolerated well. Applied a bandaid to the injection site. At 1833 assessed pain while patient was on treatment table. Patient reports left shoulder pain 4 on a scale of 0-10 and that the injection helped. Will inform Dr. Mitzi Hansen of patient's intense pain to see if he wishes to refer patient back to surgeon for evaluation.

## 2011-09-01 NOTE — Progress Notes (Signed)
Spoke with Dr. Kathrynn Running at length reference this patient's situation. Dr. Kathrynn Running confirms the patient needs to be seen by one of our radiation oncologist to evaluate source of pain. Patient agrees to present early tomorrow and be seen prior to treatment.

## 2011-09-01 NOTE — Progress Notes (Signed)
  Radiation Oncology         [phone]) [phone] ________________________________  Name: BROOKELYN GAYNOR MRN: [id number]  Date: 09/01/2011  DOB: 01-11-1961  Simulation Verification Note  Status: outpatient  NARRATIVE: The patient was brought to the treatment unit and placed in the planned treatment position. The clinical setup was verified. Then port films were obtained and uploaded to the radiation oncology medical record software.  The treatment beams were carefully compared against the planned radiation fields. The shape of the radiation fields was reviewed. They targeted volume of tissue appears to be appropriately covered by the radiation beams. Organs at risk appear to be excluded as planned.  Based on my personal review, I approved the simulation verification. The patient's treatment will proceed as planned.  I have suggested in isocenter shift with reports to be performed  ------------------------------------------------  Artist Pais. Kathrynn Running, M.D.

## 2011-09-02 ENCOUNTER — Ambulatory Visit (INDEPENDENT_AMBULATORY_CARE_PROVIDER_SITE_OTHER): Payer: Medicare Other | Admitting: Surgery

## 2011-09-02 ENCOUNTER — Encounter (INDEPENDENT_AMBULATORY_CARE_PROVIDER_SITE_OTHER): Payer: Self-pay | Admitting: Surgery

## 2011-09-02 ENCOUNTER — Telehealth: Payer: Self-pay | Admitting: *Deleted

## 2011-09-02 ENCOUNTER — Ambulatory Visit: Payer: Medicare Other

## 2011-09-02 ENCOUNTER — Telehealth: Payer: Self-pay | Admitting: Radiation Oncology

## 2011-09-02 ENCOUNTER — Telehealth (INDEPENDENT_AMBULATORY_CARE_PROVIDER_SITE_OTHER): Payer: Self-pay | Admitting: General Surgery

## 2011-09-02 VITALS — BP 118/80 | HR 72 | Temp 97.6°F | Resp 16 | Ht 59.0 in | Wt 190.4 lb

## 2011-09-02 DIAGNOSIS — M79609 Pain in unspecified limb: Secondary | ICD-10-CM

## 2011-09-02 DIAGNOSIS — M79629 Pain in unspecified upper arm: Secondary | ICD-10-CM

## 2011-09-02 DIAGNOSIS — C50419 Malignant neoplasm of upper-outer quadrant of unspecified female breast: Secondary | ICD-10-CM

## 2011-09-02 MED ORDER — TRAMADOL HCL 50 MG PO TABS: 50.0000 mg | ORAL_TABLET | Freq: Four times a day (QID) | ORAL | Status: AC | PRN

## 2011-09-02 MED ORDER — GABAPENTIN 100 MG PO CAPS: 300.0000 mg | ORAL_CAPSULE | Freq: Three times a day (TID) | ORAL | Status: DC

## 2011-09-02 NOTE — Addendum Note (Signed)
Encounter addended by: Agnes Lawrence, RN on: 09/02/2011 11:54 AM<BR>     Documentation filed: Notes Section, Inpatient Document Flowsheet, Inpatient Patient Education

## 2011-09-02 NOTE — Telephone Encounter (Signed)
JANICE CALLED  TO REQUEST AN APPT FOR PT [name] RE ARM PAIN AFTER LUMPECTOMY BY DR. BLACKMAN/ REQ BY DR. MOODY BECAUSE OF ARM PAIN WHEN TRYING TO GIVE RADIATION TREATMENT/ DR. MOODY HAD TO GIVE PT IM DILAUDID/ I SPOKE WITH Zaia Lozano AND DAUGHTER TAMEAKA RE AN APPOINTMENT TODAY WITH DR. Michaell Cowing TO EVALUATE HER ARM PAIN WHICH IS EVALUATED AT A LEVEL 8 ACCORDING TO JANICE. I NOTIFIED DR. Rulon Sera RE TODAY'S APPT/ [facility] #[phone].

## 2011-09-02 NOTE — Telephone Encounter (Signed)
Unable to reach patient via phone. Phoned daughter, Wandra Mannan. Provided [name] with Kendell Bane, triage nurse for Dr. Rayburn Ma (surgeon), number and requested she have her mother phone her since she had been unable to reach her. Also, requested [name] have her mother arrive at 10 minutes till 4 today to be evaluated by one of our radiation oncology doctors. [name] verbalized understanding. [name] reports he mother does not "get up out of bed until 1030 and that is why no one could reach her."

## 2011-09-02 NOTE — Patient Instructions (Addendum)
Managing Pain  Pain after surgery or related to activity is often due to strain/injury to muscle, tendon, nerves and/or incisions.  This pain is usually short-term and will improve in a few months.   Many people find it helpful to do the following things TOGETHER to help speed the process of healing and to get back to regular activity more quickly:  1. Use the gabapentin (neurontin) 3 times a day for the next few weeks to help control the soreness 2. Avoid heavy physical activity a.  no lifting greater than 20 pounds b. Do not "push through" the pain.  Listen to your body and avoid positions and maneuvers than reproduce the pain c. Walking is okay as tolerated, but go slowly and stop when getting sore.  d. Remember: If it hurts to do it, then don't do it! 3. Take Anti-inflammatory medication  a. Take with food/snack around the clock for 1-2 weeks i. This helps the muscle and nerve tissues become less irritable and calm down faster b. Choose ONE of the following over-the-counter medications: i. Naproxen 220mg  tabs (ex. Aleve) 2 pills twice a day  ii. Ibuprofen 200mg  tabs (ex. Advil, Motrin) 3-4 pills with every meal and just before bedtime iii. Acetaminophen 500mg  tabs (Tylenol) 2 pills with every meal and just before bedtime 4. Use a Heating pad or Ice/Cold Pack a. 4-6 times a day b. May use warm bath/hottub  or showers 5. Try Gentle Massage and/or Stretching  a. at the area of pain many times a day.  Use the arm exercises as discussed & follow-up with physical therapy b. stop if you feel pain - do not overdo it  Try these steps together to help you body heal faster and avoid making things get worse.  Doing just one of these things may not be enough.    If you are not getting better after two weeks or are noticing you are getting worse, contact our office for further advice; we may need to re-evaluate you & see what other things we can do to help.

## 2011-09-02 NOTE — Progress Notes (Signed)
Late entry from 09/01/11 at 1717. Received patient and her daughter, [name], in the clinic tonight prior to treatment for post sim education. Patient is alert and oriented to person, place, and time. No distress noted. Steady gait noted. Pleasant affect noted. Oriented patient to staff and routine of the clinic. Provided patient with RADIATION THERAPY AND YOU handbook then, reviewed pertinent information. Also, provided patient with Radiaplex and Alra then, reviewed use. Reviewed potential side effects and management. Provided patient with Liborio Nixon, RN business card and encouraged to call with needs. All questions answered. Patient verbalized understanding of all things reviewed.

## 2011-09-02 NOTE — Telephone Encounter (Signed)
Patient's daughter, [name], phoned clinic this evening to inform staff her mother would not be present for treatment tomorrow. [name] goes on to report that the surgeon encouraged the patient to return for radiation on Monday and take prescription pain medication around the clock. Also, [name] reports that the surgeon encouraged her mother to attend an ABC class. Encouraged [name] to have her mother present approximately 30 minutes early for treatment on Monday so that a rad onc physician could assess her. [name] verbalized she would and understood. Encouraged to call with future needs. Informed therapist on linac 3 of this findings. Routed message to Liborio Nixon, [place] and Dr. Mitzi Hansen.

## 2011-09-02 NOTE — Telephone Encounter (Signed)
Called Dr.Blackman office and spoke with Kendell Bane, per Dr.Moody requesting she see her Surgeon again before any more radiation treatments,constant left shoulder pain stated by patient since 07/16/11 surgery, patient taking percocet 5/325mg  and 2 Oxy Ir 5mg  prior to coming for rad tx yesterday and having to have 1mg  Dilaudid IM to lay on table for treatment,Germaine has patient's phone number and will call her after getting with Dr.Blackman, did let Germaine know I had left voice message on patient's phone number, ,thanked her for the help in this matter 9:48 AM

## 2011-09-02 NOTE — Telephone Encounter (Signed)
[name] triage Rn called back they have scheduled an appt with Dr.Steven Gross today in their office,stated patient was told to come to rad onc office at 350pm today by [name]  To see MD first, ,conflicts with todays visit,informed [name] I would call patient and let her know That todays treatment is cancelled and to go to see Dr.Gross at 430pm as scheduled today, then I called and spoke with [name],therapist and  cancelled todays rad tx, called patient next and spoke with patient and daughter [name] gave instructions for her  to keep the Surgeon's appt today and I would call them tomorrow after 1100am per their request time wise ,see what was told to them by the Surgeon and will keep Dr.Moody informed,both gave verbal understanding 11:43 AM

## 2011-09-02 NOTE — Telephone Encounter (Signed)
Called pat phone=[phone] left message  Per Dr.Moody will postpone any further rad treatment, until she sees her surgeon Dr.Blackwell  S/p pain since surgery 07/16/11 under left shoulder pain 8-10 constant, will call Surgeons office and try to sert up to see him asap, asked patient to call back  9:31 AM

## 2011-09-02 NOTE — Progress Notes (Signed)
Subjective:     Patient ID: [name], female   DOB: 08/09/1960, 51 y.o.   MRN: [id number]  HPI   [name]  [birth year] [id number]  Patient Care Team: Lonie Peak as PCP - General (Physician Assistant) Shelly Rubenstein, MD as Consulting Physician (General Surgery) Jonna Coup, MD as Consulting Physician (Radiation Oncology)  This patient is a 51 y.o.female who presents today for surgical evaluation.   Procedure: Left wide local excision/lumpectomy and axillary lymph node dissection 16 July 2011 by Dr. Magnus Ivan.  Patient comes in here today feeling sore & frustrated.  Not tearful today.  Persistent burning pain in her left axilla & especially her arm.   Afraid to lift her arm.   She claims she is been using the arm/shoulder exercises I recommended last month; however; she cannot get above shoulder level. When they were mapping her for post adjuvant radiation therapy, she had to lift her left her arm up above towards her head. That was very uncomfortable. Based on concerns, Dr. Mitzi Hansen wished the patient to be seen today.  We fit her in urgently.  Has been using Neurontin and tramadol intermittently. Comes in today with her extended family.  Daughter with many questions/concerns  No fevers or chills. No pain in the joints. She was worried that she might have had injury to her shoulder however. No falls or trauma. Feels her left hand is more swollen than she can't see her knuckles. Starting [employer] patches.  Patient Active Problem List  Diagnosis  . Breast cancer  . Obesity  . Anemia  . Pain of upper left arm  . Myocardial infarction  . Malignant neoplasm of upper-outer quadrant of female breast    Past Medical History  Diagnosis Date  . Depression   . Hypothyroidism   . Congestive heart failure   . Emphysema of lung   . GERD (gastroesophageal reflux disease)   . Heart murmur   . Hypertension   . Anemia 05/19/2011  . Nonischemic cardiomyopathy     Cardiologist Dr.  Armanda Magic  . Pneumonia   . History of bronchitis   . Shortness of breath 07/16/11    "at all times"  . Blood transfusion   . Chronic lower back pain   . Breast cancer 07/16/2011    left breast lumpectomy=invasive ductal ca,2. cm,high grade ductal ca in situ,11 benign lymph nodes  . Obesity   . Allergy   . Neuromuscular disorder     tingling left hand  . Myocardial infarction 2003  . Hyperlipidemia     Past Surgical History  Procedure Date  . Portacath placement 02/26/2011    Procedure: INSERTION PORT-A-CATH;  Surgeon: Shelly Rubenstein, MD;  Location: [facility] OR;  Service: General;  Laterality: Right;  . Breast biopsy     left breast  . Breast lumpectomy 07/16/11    w/LND; left.ER/PR=positive  . Abdominal hysterectomy ~2006    partial  . Tubal ligation 1991    History   Social History  . Marital Status: Divorced    Spouse Name: N/A    Number of Children: N/A  . Years of Education: N/A   Occupational History  . Not on file.   Social History Main Topics  . Smoking status: Former Smoker -- 1.0 packs/day for 40 years    Types: Cigarettes    Quit date: 02/20/2011  . Smokeless tobacco: Never Used  . Alcohol Use: 0.0 oz/week     07/16/11 "wine coolers; eve  .  Drug Use: No  . Sexually Active: No   Other Topics Concern  . Not on file   Social History Narrative  . No narrative on file    Family History  Problem Relation Age of Onset  . Leukemia Mother   . Heart disease Maternal Grandmother     Current Outpatient Prescriptions  Medication Sig Dispense Refill  . carvedilol (COREG) 25 MG tablet Take 25 mg by mouth 2 (two) times daily with a meal.        . furosemide (LASIX) 40 MG tablet Take 80-120 mg by mouth daily. 120 mg in the am and 80 mg in the pm.      . gabapentin (NEURONTIN) 100 MG capsule Take 3 capsules (300 mg total) by mouth 3 (three) times daily.  60 capsule  1  . Ibuprofen-Diphenhydramine HCl (ADVIL PM) 200-25 MG CAPS Take 3 capsules by mouth at  bedtime.        [name] levothyroxine (SYNTHROID, LEVOTHROID) 100 MCG tablet Take 100 mcg by mouth daily.        [name] lidocaine-prilocaine (EMLA) cream Apply 1 application topically as needed. For pain      . lisinopril (PRINIVIL,ZESTRIL) 20 MG tablet Take 20 mg by mouth daily.        [name] LORazepam (ATIVAN) 0.5 MG tablet TAKE ONE TABLET BY MOUTH TWICE DAILY AS NEEDED  60 tablet  0  . magnesium oxide (MAG-OX) 400 MG tablet Take 800 mg by mouth daily.        . Melatonin 10 MG CAPS Take 40 mg by mouth at bedtime.      . nitroGLYCERIN (NITROSTAT) 0.4 MG SL tablet Place 0.4 mg under the tongue every 5 (five) minutes as needed. As needed for chest pain.      . non-metallic deodorant Thornton Papas) MISC Apply 1 application topically daily as needed.      [name] omeprazole (PRILOSEC) 20 MG capsule Take 20 mg by mouth 2 (two) times daily.       [name] OVER THE COUNTER MEDICATION Take 3 capsules by mouth every morning. Green Tea Extract OTC.       [name] oxyCODONE (OXY IR/ROXICODONE) 5 MG immediate release tablet Take 1 tablet (5 mg total) by mouth 1 day or 1 dose. Take 1-2 tabs 30 minutes prior to treatment each day for pain.  60 tablet  0  . oxyCODONE-acetaminophen (PERCOCET) 5-325 MG per tablet Take 1 tablet by mouth 2 (two) times daily as needed. As needed for pain.      . potassium chloride SA (K-DUR,KLOR-CON) 20 MEQ tablet Take 20 mEq by mouth 2 (two) times daily.      . sertraline (ZOLOFT) 100 MG tablet Take 100 mg by mouth daily.        . sertraline (ZOLOFT) 50 MG tablet Take 50-150 mg by mouth daily as needed. Patient may take and additional 50 mg if she feels she is having a bad day      . traZODone (DESYREL) 50 MG tablet Take 1 tablet (50 mg total) by mouth at bedtime.  30 tablet  1  . Wound Cleansers (RADIAPLEX EX) Apply topically.      [name] zolpidem (AMBIEN) 10 MG tablet Take 10 mg by mouth at bedtime.       [name] DISCONTD: gabapentin (NEURONTIN) 100 MG capsule Take 1-2 capsules (100-200 mg total) by mouth 3 (three) times daily as  needed.  30 capsule  2  . traMADol (ULTRAM) 50 MG tablet Take 1-2 tablets (  50-100 mg total) by mouth every 6 (six) hours as needed for pain.  40 tablet  1   No current facility-administered medications for this visit.   Facility-Administered Medications Ordered in Other Visits  Medication Dose Route Frequency Provider Last Rate Last Dose  . HYDROmorphone (DILAUDID) injection 1 mg  1 mg Intramuscular Once Oneita Hurt, MD   1 mg at 09/01/11 1754  . sodium chloride 0.9 % injection 10 mL  10 mL Intracatheter PRN Lowella Dell, MD         Allergies  Allergen Reactions  . Aspirin Hives  . Docetaxel Other (See Comments)    Chest tightness and pain  . Morphine And Related Hives and Swelling  . Penicillins Hives    BP 118/80  Pulse 72  Temp 97.6 F (36.4 C) (Temporal)  Resp 16  Ht 4\' 11"  (1.499 m)  Wt 190 lb 6.4 oz (86.365 kg)  BMI 38.46 kg/m2     Review of Systems  Constitutional: Negative for fever, chills and diaphoresis.  HENT: Negative for ear pain, sore throat and trouble swallowing.   Eyes: Negative for photophobia and visual disturbance.  Respiratory: Negative for cough, choking and shortness of breath.   Cardiovascular: Negative for chest pain and palpitations.  Gastrointestinal: Negative for nausea, vomiting, abdominal pain, diarrhea, constipation, anal bleeding and rectal pain.  Genitourinary: Negative for dysuria, frequency and difficulty urinating.  Musculoskeletal: Positive for myalgias. Negative for gait problem.  Skin: Negative for color change, pallor and rash.  Neurological: Negative for dizziness, speech difficulty, weakness and numbness.  Hematological: Negative for adenopathy.  Psychiatric/Behavioral: Negative for confusion and agitation. The patient is not nervous/anxious.        Objective:   Physical Exam  Constitutional: She is oriented to person, place, and time. She appears well-developed and well-nourished. No distress.  HENT:  Head:  Normocephalic.  Mouth/Throat: Oropharynx is clear and moist. No oropharyngeal exudate.  Eyes: Conjunctivae and EOM are normal. Pupils are equal, round, and reactive to light. No scleral icterus.  Neck: Normal range of motion. No tracheal deviation present.  Cardiovascular: Normal rate and intact distal pulses.   Pulmonary/Chest: Effort normal. No respiratory distress. She exhibits no tenderness.    Abdominal: Soft. She exhibits no distension. There is no tenderness. Hernia confirmed negative in the right inguinal area and confirmed negative in the left inguinal area.       Incisions clean with normal healing ridges.  No hernias  Genitourinary: No vaginal discharge found.  Musculoskeletal: Normal range of motion. She exhibits no tenderness.       Left shoulder: She exhibits no bony tenderness, no swelling, no effusion, no crepitus, no deformity and normal strength.  Lymphadenopathy:       Right: No inguinal adenopathy present.       Left: No inguinal adenopathy present.  Neurological: She is alert and oriented to person, place, and time. No cranial nerve deficit. She exhibits normal muscle tone. Coordination normal.  Skin: Skin is warm and dry. No rash noted. She is not diaphoretic.  Psychiatric: She has a normal mood and affect. Her behavior is normal.       Assessment:     Healing well but probable paresthesias/pain     Plan:     I tried to reassure there is no evidence of infection and everything was healing up.  I stressed to her she need to start using the arm.  I recommended physical therapy evaluation for exercises and rule out  lymphedema. She initially was against this but her daughter seem to help convince her to completely consider.  ABC meeting also for support  I see no strong need for Xrays at this time. If she does not make improvements with the anti-inflammatory regimens and supervised therapy, reconsider her radiological evaluation. I will defer to Dr. Magnus Ivan on this     Use nonsteroidals and Neurontin around-the-clock for the next one to 2 weeks to help calm this down. Heat around-the-clock as well. Don't wait until the pain is severe.  Save tramadol for breakthrough pain.  Continue her antidepressants. Continue to work on controlling her anxiety.  RTC Dr. Magnus Ivan for follow-up.

## 2011-09-03 ENCOUNTER — Ambulatory Visit: Admission: RE | Admit: 2011-09-03 | Payer: Medicare Other | Source: Ambulatory Visit

## 2011-09-03 ENCOUNTER — Ambulatory Visit: Payer: Medicare Other

## 2011-09-03 ENCOUNTER — Telehealth (INDEPENDENT_AMBULATORY_CARE_PROVIDER_SITE_OTHER): Payer: Self-pay

## 2011-09-03 DIAGNOSIS — C50912 Malignant neoplasm of unspecified site of left female breast: Secondary | ICD-10-CM

## 2011-09-03 NOTE — Telephone Encounter (Signed)
Called the pt to let her know that I sent the PT order in Epic but they are closed already today. I told her that I did leave a message on their machine so they should call her on Monday to talk about a appt time. I gave the PT phone # so the pt can f/u with April.

## 2011-09-03 NOTE — Telephone Encounter (Signed)
LMOM w/April to notify her that there is PT order in Epic to please call the pt.

## 2011-09-05 ENCOUNTER — Other Ambulatory Visit: Payer: Self-pay | Admitting: Oncology

## 2011-09-05 DIAGNOSIS — C50419 Malignant neoplasm of upper-outer quadrant of unspecified female breast: Secondary | ICD-10-CM

## 2011-09-06 ENCOUNTER — Ambulatory Visit
Admission: RE | Admit: 2011-09-06 | Discharge: 2011-09-06 | Disposition: A | Discharge status: Home / Self Care | Payer: Medicare Other | Source: Ambulatory Visit | Attending: Radiation Oncology | Admitting: Radiation Oncology

## 2011-09-06 ENCOUNTER — Inpatient Hospital Stay: Admission: RE | Admit: 2011-09-06 | Payer: Medicare Other | Source: Ambulatory Visit

## 2011-09-06 ENCOUNTER — Encounter: Payer: Self-pay | Admitting: Radiation Oncology

## 2011-09-06 ENCOUNTER — Encounter (INDEPENDENT_AMBULATORY_CARE_PROVIDER_SITE_OTHER): Payer: Medicare Other | Admitting: Surgery

## 2011-09-06 VITALS — BP 118/85 | HR 80 | Temp 99.1°F | Resp 20 | Wt 193.9 lb

## 2011-09-06 DIAGNOSIS — Z510 Encounter for antineoplastic radiation therapy: Secondary | ICD-10-CM | POA: Diagnosis not present

## 2011-09-06 DIAGNOSIS — Z79899 Other long term (current) drug therapy: Secondary | ICD-10-CM | POA: Diagnosis not present

## 2011-09-06 DIAGNOSIS — C50919 Malignant neoplasm of unspecified site of unspecified female breast: Secondary | ICD-10-CM

## 2011-09-06 DIAGNOSIS — C50419 Malignant neoplasm of upper-outer quadrant of unspecified female breast: Secondary | ICD-10-CM

## 2011-09-06 MED ORDER — HYDROMORPHONE HCL PF 4 MG/ML IJ SOLN
2.0000 mg | Freq: Once | INTRAMUSCULAR | Status: DC
  Filled 2011-09-06: qty 1

## 2011-09-06 MED ORDER — OXYCODONE-ACETAMINOPHEN 5-325 MG PO TABS: 1.0000 | ORAL_TABLET | ORAL | Status: AC | PRN

## 2011-09-06 MED ORDER — HYDROMORPHONE HCL PF 1 MG/ML IJ SOLN
1.0000 mg | Freq: Once | INTRAMUSCULAR | Status: AC
  Administered 2011-09-06: 1 mg via INTRAMUSCULAR
  Filled 2011-09-06: qty 1

## 2011-09-06 NOTE — Telephone Encounter (Signed)
This refill called to pharmacy

## 2011-09-06 NOTE — Progress Notes (Signed)
Gave 1mg  dilaudid  IM left deltoid at 1643pm, pt tolerated well, escorted patient to linac 3 dressing room,family  To waiting area , rx for percocet given to daughter tameka and Judi Cong say she could go to our outpatient pharmacy here at [facility] long if she prefers than going to [facility] pharmacy 4:48 PM

## 2011-09-06 NOTE — Progress Notes (Signed)
Patient alert,oriented x3, to see MD first before 1st tx today,left hand lymphedema,saw dR.gROSS 6/113/13 PER dR.mOODY SUGGESTION, INCREASED HER NEURO TIN TO 300MG  TID, TAMADOL TO Q 6 HPRN, AND EITHER TYLENOL OR ALEVE PRN TOO, PATIENT DENIES ANY PAIN AT PRESENT

## 2011-09-07 ENCOUNTER — Ambulatory Visit
Admission: RE | Admit: 2011-09-07 | Discharge: 2011-09-07 | Disposition: A | Discharge status: Home / Self Care | Payer: Medicare Other | Source: Ambulatory Visit | Attending: Radiation Oncology | Admitting: Radiation Oncology

## 2011-09-07 ENCOUNTER — Encounter: Payer: Self-pay | Admitting: Radiation Oncology

## 2011-09-07 DIAGNOSIS — C50419 Malignant neoplasm of upper-outer quadrant of unspecified female breast: Secondary | ICD-10-CM

## 2011-09-07 DIAGNOSIS — Z79899 Other long term (current) drug therapy: Secondary | ICD-10-CM | POA: Diagnosis not present

## 2011-09-07 DIAGNOSIS — C50919 Malignant neoplasm of unspecified site of unspecified female breast: Secondary | ICD-10-CM | POA: Diagnosis not present

## 2011-09-07 DIAGNOSIS — Z510 Encounter for antineoplastic radiation therapy: Secondary | ICD-10-CM | POA: Diagnosis not present

## 2011-09-07 NOTE — Progress Notes (Signed)
  Radiation Oncology         [phone]) [phone] ________________________________  Name: [name] MRN: [id number]  Date: 09/06/2011  DOB: 05/20/1960  Simulation Verification Note  Status: outpatient  NARRATIVE: The patient was brought to the treatment unit and placed in the planned breast treatment position. The clinical setup was verified. Then port films were obtained and uploaded to the radiation oncology medical record software.  The treatment beams were carefully compared against the planned radiation fields. The position location and shape of the radiation fields was reviewed. They targeted volume of tissue appears to be appropriately covered by the radiation beams. Organs at risk appear to be excluded as planned.  Based on my personal review, I approved the simulation verification. The patient's treatment will proceed as planned.  Today, I gave her prescription for Percocet for her arm and shoulder discomfort. Also requested a home health valuation for physical therapy related to her shoulder range of motion. She was given a injection of Dilaudid 2 mg today to hold her treatment position.  Our physicists raised some concerns about her ability to tolerate deep inspiration breath-hold radiotherapy given her difficulty following instructions. On the treatment machine, we were able to position her isocenter accurately and ultimately deliver treatment on the first day without issues. However, her candidacy for breath-hold radiation remains under scrutiny for ongoing evaluation   ------------------------------------------------  Artist Pais. Kathrynn Running, M.D.

## 2011-09-08 ENCOUNTER — Ambulatory Visit: Payer: Medicare Other

## 2011-09-08 ENCOUNTER — Encounter: Payer: Self-pay | Admitting: *Deleted

## 2011-09-08 ENCOUNTER — Ambulatory Visit
Admission: RE | Admit: 2011-09-08 | Discharge: 2011-09-08 | Disposition: A | Discharge status: Home / Self Care | Payer: Medicare Other | Source: Ambulatory Visit | Attending: Radiation Oncology | Admitting: Radiation Oncology

## 2011-09-08 DIAGNOSIS — C50419 Malignant neoplasm of upper-outer quadrant of unspecified female breast: Secondary | ICD-10-CM

## 2011-09-08 NOTE — Addendum Note (Signed)
Encounter addended by: Jonna Coup, MD on: 09/08/2011  3:51 PM<BR>     Documentation filed: Visit Diagnoses, Notes Section

## 2011-09-08 NOTE — Progress Notes (Signed)
Per Alessandra Grout, she spoke with Raynelle Fanning from [facility], and that physical therapist will see the p[atient at her home and will call to set up appt, informed Dr.Moody, and patient arrived and put in dressing room 12 12:36 PM

## 2011-09-08 NOTE — Progress Notes (Signed)
Department of Radiation Oncology  Phone:  [phone] Fax:        [phone]  Weekly Treatment Note    Name: [name] Date: 09/08/2011 MRN: [id number] DOB: [birth year]   Current dose: 3.6 Gy  Current fraction:2   MEDICATIONS: Current Outpatient Prescriptions  Medication Sig Dispense Refill  . carvedilol (COREG) 25 MG tablet Take 25 mg by mouth 2 (two) times daily with a meal.        . furosemide (LASIX) 40 MG tablet Take 80-120 mg by mouth daily. 120 mg in the am and 80 mg in the pm.      . gabapentin (NEURONTIN) 100 MG capsule Take 3 capsules (300 mg total) by mouth 3 (three) times daily.  60 capsule  1  . Ibuprofen-Diphenhydramine HCl (ADVIL PM) 200-25 MG CAPS Take 3 capsules by mouth at bedtime.        [name] levothyroxine (SYNTHROID, LEVOTHROID) 100 MCG tablet Take 100 mcg by mouth daily.        [name] lidocaine-prilocaine (EMLA) cream Apply 1 application topically as needed. For pain      . lisinopril (PRINIVIL,ZESTRIL) 20 MG tablet Take 20 mg by mouth daily.        [name] LORazepam (ATIVAN) 0.5 MG tablet TAKE ONE TABLET BY MOUTH TWICE DAILY FOR NAUSEA / FOR ANXIETY  60 tablet  0  . magnesium oxide (MAG-OX) 400 MG tablet Take 800 mg by mouth daily.        . Melatonin 10 MG CAPS Take 40 mg by mouth at bedtime.      . nitroGLYCERIN (NITROSTAT) 0.4 MG SL tablet Place 0.4 mg under the tongue every 5 (five) minutes as needed. As needed for chest pain.      . non-metallic deodorant Thornton Papas) MISC Apply 1 application topically daily as needed.      [name] omeprazole (PRILOSEC) 20 MG capsule Take 20 mg by mouth 2 (two) times daily.       [name] OVER THE COUNTER MEDICATION Take 3 capsules by mouth every morning. Green Tea Extract OTC.       [name] oxyCODONE-acetaminophen (PERCOCET) 5-325 MG per tablet Take 1 tablet by mouth 2 (two) times daily as needed. As needed for pain.      [name] oxyCODONE-acetaminophen (PERCOCET) 5-325 MG per tablet Take 1 tablet by mouth every 4 (four) hours as needed for pain.  90 tablet   0  . potassium chloride SA (K-DUR,KLOR-CON) 20 MEQ tablet Take 20 mEq by mouth 2 (two) times daily.      . sertraline (ZOLOFT) 100 MG tablet Take 100 mg by mouth daily.        . sertraline (ZOLOFT) 50 MG tablet Take 50-150 mg by mouth daily as needed. Patient may take and additional 50 mg if she feels she is having a bad day      . traMADol (ULTRAM) 50 MG tablet Take 1-2 tablets (50-100 mg total) by mouth every 6 (six) hours as needed for pain.  40 tablet  1  . traZODone (DESYREL) 50 MG tablet Take 1 tablet (50 mg total) by mouth at bedtime.  30 tablet  1  . Wound Cleansers (RADIAPLEX EX) Apply topically.      [name] zolpidem (AMBIEN) 10 MG tablet Take 10 mg by mouth at bedtime.        No current facility-administered medications for this encounter.   Facility-Administered Medications Ordered in Other Encounters  Medication Dose Route Frequency Provider Last Rate Last Dose  .  sodium chloride 0.9 % injection 10 mL  10 mL Intracatheter PRN Lowella Dell, MD         ALLERGIES: Aspirin; Docetaxel; Morphine and related; and Penicillins   LABORATORY DATA:  Lab Results  Component Value Date   WBC 4.1 08/20/2011   HGB 10.5* 08/20/2011   HCT 32.0* 08/20/2011   MCV 91.1 08/20/2011   PLT 226 08/20/2011   Lab Results  Component Value Date   NA 142 07/06/2011   K 4.0 07/06/2011   CL 103 07/06/2011   CO2 28 07/06/2011   Lab Results  Component Value Date   ALT 15 06/22/2011   AST 21 06/22/2011   ALKPHOS 79 06/22/2011   BILITOT 0.4 06/22/2011     NARRATIVE: [name] was seen today for weekly treatment management. The chart was checked and the patient's films were reviewed. The patient has continued to have substantial difficulty raising her arm. She has been evaluated by surgery and physical therapy has been set up for her at home. This is not yet occurred but will be soon. The patient in addition to oral pain medicines and the other medicines which she is on at baseline, she is also been requiring IM  dilaudid did before her 2 treatments and before her pretreatment films. This setup is clearly not working for her and therefore we discussed where to go from here  PHYSICAL EXAMINATION: vitals were not taken for this visit.     the patient was unable to substantially raise her arm. She was not able to get this to horizontal on its own. When asked to raise her arm she initially was using her contralateral side to the left.  ASSESSMENT: The patient cannot continue with her current treatment set up.  PLAN: [name] going to put her on break right now. She has had 2 fractions and I don't believe that a break will cause major problems if we stop right now. I certainly would rather do this after 2 fractions then be dealing with the same issue in a week. The patient will begin physical therapy as soon as possible. We will check with her next week to see how this is progressing. We will then determine when to schedule her for an other simulation where we can check her setup and change this and perform another simulation if necessary. We may end up having to do something with a completely different set up but we will see how she does over the next one to 2 weeks.

## 2011-09-08 NOTE — Progress Notes (Signed)
Susan,Dale,RN,BSN of [facility] was at nursing today, did get the order for patient to have Physical Therapy to go to patients home, she needed  to clarify if patient needed a nurse to go to patients home as well as Physical therapy, no Nurse is needed ,patient only needs help with left shoulder  For ,exercises so  Darl Pikes will E-mail appropriate  Staff to get this in motion and will e-mail back later with status 11:03 AM notified Dr,.[employer]

## 2011-09-09 ENCOUNTER — Ambulatory Visit: Payer: Medicare Other

## 2011-09-09 ENCOUNTER — Telehealth: Payer: Self-pay | Admitting: *Deleted

## 2011-09-09 DIAGNOSIS — M7500 Adhesive capsulitis of unspecified shoulder: Secondary | ICD-10-CM | POA: Diagnosis not present

## 2011-09-09 DIAGNOSIS — I10 Essential (primary) hypertension: Secondary | ICD-10-CM | POA: Diagnosis not present

## 2011-09-09 DIAGNOSIS — C50419 Malignant neoplasm of upper-outer quadrant of unspecified female breast: Secondary | ICD-10-CM | POA: Diagnosis not present

## 2011-09-09 DIAGNOSIS — IMO0001 Reserved for inherently not codable concepts without codable children: Secondary | ICD-10-CM | POA: Diagnosis not present

## 2011-09-09 DIAGNOSIS — F329 Major depressive disorder, single episode, unspecified: Secondary | ICD-10-CM | POA: Diagnosis not present

## 2011-09-09 DIAGNOSIS — R269 Unspecified abnormalities of gait and mobility: Secondary | ICD-10-CM | POA: Diagnosis not present

## 2011-09-09 NOTE — Telephone Encounter (Signed)
Dionicio Stall [facility] called and stated she received the fax form face to face  And that patient is scheduled to be seen today,will let MD know, thanked her for calling back with confirmation of faxed form 11:16 AM

## 2011-09-10 ENCOUNTER — Ambulatory Visit: Payer: Medicare Other

## 2011-09-13 ENCOUNTER — Ambulatory Visit: Payer: Medicare Other

## 2011-09-13 ENCOUNTER — Encounter: Payer: Self-pay | Admitting: *Deleted

## 2011-09-13 DIAGNOSIS — I10 Essential (primary) hypertension: Secondary | ICD-10-CM | POA: Diagnosis not present

## 2011-09-13 DIAGNOSIS — R269 Unspecified abnormalities of gait and mobility: Secondary | ICD-10-CM | POA: Diagnosis not present

## 2011-09-13 DIAGNOSIS — F329 Major depressive disorder, single episode, unspecified: Secondary | ICD-10-CM | POA: Diagnosis not present

## 2011-09-13 DIAGNOSIS — IMO0001 Reserved for inherently not codable concepts without codable children: Secondary | ICD-10-CM | POA: Diagnosis not present

## 2011-09-13 DIAGNOSIS — C50419 Malignant neoplasm of upper-outer quadrant of unspecified female breast: Secondary | ICD-10-CM | POA: Diagnosis not present

## 2011-09-13 DIAGNOSIS — M7500 Adhesive capsulitis of unspecified shoulder: Secondary | ICD-10-CM | POA: Diagnosis not present

## 2011-09-13 NOTE — Progress Notes (Signed)
[facility] Psychosocial Distress Screening Clinical Social Work  Clinical Social Work was referred by distress screening protocol.  The patient scored a 10 on the Psychosocial Distress Thermometer which indicates severe distress. Clinical Social Worker contacted pt at home to assess for distress and other psychosocial needs.  Pt expressed increase stress due to the number of medical bills she is receiving.  Pt stated she had applied for patient assistance at [facility], but was unsure of the results.  CSW left a message for financial advocate requesting clarification on pt's financial status, and inform of pt's request for phone call.  Pt recently had a close friend dx with cancer, and expressed feeling overwhelmed with processing her dx along with her friends.  CSW validated pt's feelings, and offered additional support.  CSW informed pt of the supportive services at [facility] and encouraged her to call with and additional needs or concerns.         Tamala Julian, MSW, LCSW Clinical Social Worker [facility] [phone]

## 2011-09-14 ENCOUNTER — Ambulatory Visit: Payer: Medicare Other

## 2011-09-14 ENCOUNTER — Telehealth: Payer: Self-pay | Admitting: *Deleted

## 2011-09-14 ENCOUNTER — Telehealth: Payer: Self-pay | Admitting: Oncology

## 2011-09-14 NOTE — Telephone Encounter (Signed)
Called patient concerning bills patient was not in, left message for patient to call back when available.

## 2011-09-14 NOTE — Telephone Encounter (Signed)
Back after 1030 am,patient from previous talks doesn't answer phone before then 9:31 AM

## 2011-09-14 NOTE — Telephone Encounter (Signed)
Called [facility] and left voice message asking how physical therapy was going for her [phone], then called [name],patient's daughter, spoke with her and she stated"physical therapy is going to patient's home 4 days a week and she feels this is helping her  Mother a lot, and that she feels in a couple weeks she sees no reason why her mother,Shealynn, cannot resume radiation therapy", Informed daughter,[name] I would e-mail this information to Dr.Moody with status, but would have patient see MD first before resuming radiation therapy may need to be re- simmed,[name] gave verbal understanding Thanked her for the information and glad that Physical Therapy is helping Meylin 1:05 PM

## 2011-09-15 ENCOUNTER — Ambulatory Visit: Payer: Medicare Other

## 2011-09-15 DIAGNOSIS — C50419 Malignant neoplasm of upper-outer quadrant of unspecified female breast: Secondary | ICD-10-CM | POA: Diagnosis not present

## 2011-09-15 DIAGNOSIS — R269 Unspecified abnormalities of gait and mobility: Secondary | ICD-10-CM | POA: Diagnosis not present

## 2011-09-15 DIAGNOSIS — M7500 Adhesive capsulitis of unspecified shoulder: Secondary | ICD-10-CM | POA: Diagnosis not present

## 2011-09-15 DIAGNOSIS — I10 Essential (primary) hypertension: Secondary | ICD-10-CM | POA: Diagnosis not present

## 2011-09-15 DIAGNOSIS — F329 Major depressive disorder, single episode, unspecified: Secondary | ICD-10-CM | POA: Diagnosis not present

## 2011-09-15 DIAGNOSIS — IMO0001 Reserved for inherently not codable concepts without codable children: Secondary | ICD-10-CM | POA: Diagnosis not present

## 2011-09-16 ENCOUNTER — Ambulatory Visit: Payer: Medicare Other

## 2011-09-17 ENCOUNTER — Ambulatory Visit: Payer: Medicare Other

## 2011-09-17 DIAGNOSIS — I10 Essential (primary) hypertension: Secondary | ICD-10-CM | POA: Diagnosis not present

## 2011-09-17 DIAGNOSIS — C50419 Malignant neoplasm of upper-outer quadrant of unspecified female breast: Secondary | ICD-10-CM | POA: Diagnosis not present

## 2011-09-17 DIAGNOSIS — R269 Unspecified abnormalities of gait and mobility: Secondary | ICD-10-CM | POA: Diagnosis not present

## 2011-09-17 DIAGNOSIS — F329 Major depressive disorder, single episode, unspecified: Secondary | ICD-10-CM | POA: Diagnosis not present

## 2011-09-17 DIAGNOSIS — IMO0001 Reserved for inherently not codable concepts without codable children: Secondary | ICD-10-CM | POA: Diagnosis not present

## 2011-09-17 DIAGNOSIS — M7500 Adhesive capsulitis of unspecified shoulder: Secondary | ICD-10-CM | POA: Diagnosis not present

## 2011-09-20 ENCOUNTER — Ambulatory Visit: Payer: Medicare Other

## 2011-09-20 DIAGNOSIS — C50419 Malignant neoplasm of upper-outer quadrant of unspecified female breast: Secondary | ICD-10-CM | POA: Diagnosis not present

## 2011-09-20 DIAGNOSIS — F329 Major depressive disorder, single episode, unspecified: Secondary | ICD-10-CM | POA: Diagnosis not present

## 2011-09-20 DIAGNOSIS — M7500 Adhesive capsulitis of unspecified shoulder: Secondary | ICD-10-CM | POA: Diagnosis not present

## 2011-09-20 DIAGNOSIS — R269 Unspecified abnormalities of gait and mobility: Secondary | ICD-10-CM | POA: Diagnosis not present

## 2011-09-20 DIAGNOSIS — IMO0001 Reserved for inherently not codable concepts without codable children: Secondary | ICD-10-CM | POA: Diagnosis not present

## 2011-09-20 DIAGNOSIS — I10 Essential (primary) hypertension: Secondary | ICD-10-CM | POA: Diagnosis not present

## 2011-09-21 ENCOUNTER — Encounter (INDEPENDENT_AMBULATORY_CARE_PROVIDER_SITE_OTHER): Payer: Medicare Other | Admitting: Surgery

## 2011-09-21 ENCOUNTER — Ambulatory Visit: Payer: Medicare Other

## 2011-09-22 ENCOUNTER — Ambulatory Visit: Payer: Medicare Other

## 2011-09-22 ENCOUNTER — Ambulatory Visit: Admission: RE | Admit: 2011-09-22 | Payer: Medicare Other | Source: Ambulatory Visit | Admitting: Radiation Oncology

## 2011-09-22 DIAGNOSIS — M7500 Adhesive capsulitis of unspecified shoulder: Secondary | ICD-10-CM | POA: Diagnosis not present

## 2011-09-22 DIAGNOSIS — I10 Essential (primary) hypertension: Secondary | ICD-10-CM | POA: Diagnosis not present

## 2011-09-22 DIAGNOSIS — R269 Unspecified abnormalities of gait and mobility: Secondary | ICD-10-CM | POA: Diagnosis not present

## 2011-09-22 DIAGNOSIS — C50419 Malignant neoplasm of upper-outer quadrant of unspecified female breast: Secondary | ICD-10-CM | POA: Diagnosis not present

## 2011-09-22 DIAGNOSIS — IMO0001 Reserved for inherently not codable concepts without codable children: Secondary | ICD-10-CM | POA: Diagnosis not present

## 2011-09-22 DIAGNOSIS — F329 Major depressive disorder, single episode, unspecified: Secondary | ICD-10-CM | POA: Diagnosis not present

## 2011-09-24 ENCOUNTER — Ambulatory Visit: Payer: Medicare Other

## 2011-09-24 DIAGNOSIS — F329 Major depressive disorder, single episode, unspecified: Secondary | ICD-10-CM | POA: Diagnosis not present

## 2011-09-24 DIAGNOSIS — IMO0001 Reserved for inherently not codable concepts without codable children: Secondary | ICD-10-CM | POA: Diagnosis not present

## 2011-09-24 DIAGNOSIS — C50419 Malignant neoplasm of upper-outer quadrant of unspecified female breast: Secondary | ICD-10-CM | POA: Diagnosis not present

## 2011-09-24 DIAGNOSIS — I10 Essential (primary) hypertension: Secondary | ICD-10-CM | POA: Diagnosis not present

## 2011-09-24 DIAGNOSIS — R269 Unspecified abnormalities of gait and mobility: Secondary | ICD-10-CM | POA: Diagnosis not present

## 2011-09-24 DIAGNOSIS — M7500 Adhesive capsulitis of unspecified shoulder: Secondary | ICD-10-CM | POA: Diagnosis not present

## 2011-09-27 ENCOUNTER — Ambulatory Visit: Payer: Medicare Other

## 2011-09-27 ENCOUNTER — Encounter: Payer: Self-pay | Admitting: Radiation Oncology

## 2011-09-27 ENCOUNTER — Telehealth: Payer: Self-pay | Admitting: *Deleted

## 2011-09-27 ENCOUNTER — Ambulatory Visit (HOSPITAL_BASED_OUTPATIENT_CLINIC_OR_DEPARTMENT_OTHER): Payer: Medicare Other

## 2011-09-27 ENCOUNTER — Ambulatory Visit
Admission: RE | Admit: 2011-09-27 | Discharge: 2011-09-27 | Disposition: A | Discharge status: Home / Self Care | Payer: Medicare Other | Source: Ambulatory Visit | Attending: Radiation Oncology | Admitting: Radiation Oncology

## 2011-09-27 ENCOUNTER — Ambulatory Visit: Payer: Medicare Other | Admitting: Physical Therapy

## 2011-09-27 ENCOUNTER — Other Ambulatory Visit: Payer: Self-pay | Admitting: Radiation Oncology

## 2011-09-27 DIAGNOSIS — Z79899 Other long term (current) drug therapy: Secondary | ICD-10-CM | POA: Diagnosis not present

## 2011-09-27 DIAGNOSIS — C50919 Malignant neoplasm of unspecified site of unspecified female breast: Secondary | ICD-10-CM | POA: Diagnosis not present

## 2011-09-27 DIAGNOSIS — Z452 Encounter for adjustment and management of vascular access device: Secondary | ICD-10-CM

## 2011-09-27 DIAGNOSIS — Z510 Encounter for antineoplastic radiation therapy: Secondary | ICD-10-CM | POA: Diagnosis not present

## 2011-09-27 MED ORDER — SODIUM CHLORIDE 0.9 % IJ SOLN
10.0000 mL | INTRAMUSCULAR | Status: DC | PRN
  Administered 2011-09-27: 10 mL via INTRAVENOUS
  Filled 2011-09-27: qty 10

## 2011-09-27 MED ORDER — HYDROCODONE-ACETAMINOPHEN 5-325 MG PO TABS: 1.0000 | ORAL_TABLET | Freq: Once | ORAL | Status: AC

## 2011-09-27 MED ORDER — HEPARIN SOD (PORK) LOCK FLUSH 100 UNIT/ML IV SOLN
500.0000 [IU] | Freq: Once | INTRAVENOUS | Status: AC
  Administered 2011-09-27: 500 [IU] via INTRAVENOUS
  Filled 2011-09-27: qty 5

## 2011-09-27 NOTE — Progress Notes (Signed)
VERIFICATION SIMULATION NOTE  The patient was laid in the correct position on the treatment table for simulation verification. Portal imaging was obtained and I verified the fields and MLCs for her breast and lymph node regions to be accurate. The patient tolerated the procedure well.  -----------------------------------------------------  Lonie Peak, MD

## 2011-09-27 NOTE — Telephone Encounter (Signed)
RX Norco 5-325mg (Hydrocodone/Acetaminophen  written for patient By On Call MD Dr. Basilio Cairo, 30tabs no refill, 1 tab once daily as needed before radiotherapy, patient called can pick up rx today or tomorrow,patient thanked MD and staff for the call 10:58 AM

## 2011-09-27 NOTE — Telephone Encounter (Signed)
Patient called from infusion room while getting her port acath flushed requesting Rx for Vicodin  Stating"I took that today and was able to lay on the table the whole time ,it worked better than the Oxycodone(percocet)asked how the physical therapy was working for her shoulder," a lot better can hold my arm up more " per her daughters stating in the waiting room",informed Dr.Moody on Vacation this week, she then asked what MD could write Rx for her, will ask On call MD,who is in with a new consult at the moment, patient stated"i can pick up the Rx tomorrow" will call patient back after I can speak with MD, ok to leave voice message per patient 8:24 AM

## 2011-09-28 ENCOUNTER — Ambulatory Visit
Admission: RE | Admit: 2011-09-28 | Discharge: 2011-09-28 | Disposition: A | Discharge status: Home / Self Care | Payer: Medicare Other | Source: Ambulatory Visit | Attending: Radiation Oncology | Admitting: Radiation Oncology

## 2011-09-28 ENCOUNTER — Ambulatory Visit: Payer: Medicare Other

## 2011-09-28 DIAGNOSIS — C50919 Malignant neoplasm of unspecified site of unspecified female breast: Secondary | ICD-10-CM | POA: Diagnosis not present

## 2011-09-28 DIAGNOSIS — Z79899 Other long term (current) drug therapy: Secondary | ICD-10-CM | POA: Diagnosis not present

## 2011-09-28 DIAGNOSIS — Z510 Encounter for antineoplastic radiation therapy: Secondary | ICD-10-CM | POA: Diagnosis not present

## 2011-09-29 ENCOUNTER — Ambulatory Visit
Admission: RE | Admit: 2011-09-29 | Discharge: 2011-09-29 | Disposition: A | Discharge status: Home / Self Care | Payer: Medicare Other | Source: Ambulatory Visit | Attending: Radiation Oncology | Admitting: Radiation Oncology

## 2011-09-29 ENCOUNTER — Ambulatory Visit: Payer: Medicare Other

## 2011-09-29 DIAGNOSIS — Z510 Encounter for antineoplastic radiation therapy: Secondary | ICD-10-CM | POA: Diagnosis not present

## 2011-09-29 DIAGNOSIS — Z79899 Other long term (current) drug therapy: Secondary | ICD-10-CM | POA: Diagnosis not present

## 2011-09-29 DIAGNOSIS — C50919 Malignant neoplasm of unspecified site of unspecified female breast: Secondary | ICD-10-CM | POA: Diagnosis not present

## 2011-09-30 ENCOUNTER — Ambulatory Visit
Admission: RE | Admit: 2011-09-30 | Discharge: 2011-09-30 | Disposition: A | Discharge status: Home / Self Care | Payer: Medicare Other | Source: Ambulatory Visit | Attending: Radiation Oncology | Admitting: Radiation Oncology

## 2011-09-30 ENCOUNTER — Ambulatory Visit: Payer: Medicare Other

## 2011-09-30 DIAGNOSIS — Z510 Encounter for antineoplastic radiation therapy: Secondary | ICD-10-CM | POA: Diagnosis not present

## 2011-09-30 DIAGNOSIS — Z79899 Other long term (current) drug therapy: Secondary | ICD-10-CM | POA: Diagnosis not present

## 2011-09-30 DIAGNOSIS — C50919 Malignant neoplasm of unspecified site of unspecified female breast: Secondary | ICD-10-CM | POA: Diagnosis not present

## 2011-10-01 ENCOUNTER — Ambulatory Visit
Admission: RE | Admit: 2011-10-01 | Discharge: 2011-10-01 | Disposition: A | Discharge status: Home / Self Care | Payer: Medicare Other | Source: Ambulatory Visit | Attending: Radiation Oncology | Admitting: Radiation Oncology

## 2011-10-01 ENCOUNTER — Ambulatory Visit: Payer: Medicare Other

## 2011-10-01 ENCOUNTER — Encounter: Payer: Self-pay | Admitting: Radiation Oncology

## 2011-10-01 VITALS — BP 138/75 | HR 67 | Temp 97.8°F | Resp 20 | Wt 192.6 lb

## 2011-10-01 DIAGNOSIS — C50919 Malignant neoplasm of unspecified site of unspecified female breast: Secondary | ICD-10-CM | POA: Diagnosis not present

## 2011-10-01 DIAGNOSIS — Z79899 Other long term (current) drug therapy: Secondary | ICD-10-CM | POA: Diagnosis not present

## 2011-10-01 DIAGNOSIS — C50419 Malignant neoplasm of upper-outer quadrant of unspecified female breast: Secondary | ICD-10-CM

## 2011-10-01 DIAGNOSIS — Z510 Encounter for antineoplastic radiation therapy: Secondary | ICD-10-CM | POA: Diagnosis not present

## 2011-10-01 NOTE — Progress Notes (Signed)
Patient arrived alert,oriented x3, vicodin pain med helping better stated patient, no pain, erythema on l breast,skin intact, physical therapy completed at home care, patient still doing exercises on left shoulder,able to move better, completed 6/33txs 1:11 PM

## 2011-10-01 NOTE — Progress Notes (Signed)
   Weekly Management Note left breast cancer Current Dose:   10.8Gy  Projected Dose:  60.4Gy   Narrative:  The patient presents for routine under treatment assessment.  CBCT/MVCT images/Port film x-rays were reviewed.  The chart was checked. No complaints other than some mild pruritis in the low left neck. Physical therapy improved her left arm range of motion tremendously  Physical Findings:  weight is 192 lb 9.6 oz (87.363 kg). Her oral temperature is 97.8 F (36.6 C). Her blood pressure is 138/75 and her pulse is 67. Her respiration is 20.  early hyperpigmentation in the low left neck crease  Impression:  The patient is tolerating radiotherapy.  Plan:  Continue radiotherapy as planned. Continue radiaplex approximately 3 times a day  ________________________________   Lonie Peak, M.D.

## 2011-10-04 ENCOUNTER — Other Ambulatory Visit: Payer: Self-pay | Admitting: Oncology

## 2011-10-04 ENCOUNTER — Ambulatory Visit: Payer: Medicare Other

## 2011-10-04 ENCOUNTER — Ambulatory Visit
Admission: RE | Admit: 2011-10-04 | Discharge: 2011-10-04 | Disposition: A | Discharge status: Home / Self Care | Payer: Medicare Other | Source: Ambulatory Visit | Attending: Radiation Oncology | Admitting: Radiation Oncology

## 2011-10-04 DIAGNOSIS — Z510 Encounter for antineoplastic radiation therapy: Secondary | ICD-10-CM | POA: Diagnosis not present

## 2011-10-04 DIAGNOSIS — Z79899 Other long term (current) drug therapy: Secondary | ICD-10-CM | POA: Diagnosis not present

## 2011-10-04 DIAGNOSIS — C50919 Malignant neoplasm of unspecified site of unspecified female breast: Secondary | ICD-10-CM

## 2011-10-05 ENCOUNTER — Ambulatory Visit: Payer: Medicare Other

## 2011-10-06 ENCOUNTER — Ambulatory Visit
Admission: RE | Admit: 2011-10-06 | Discharge: 2011-10-06 | Disposition: A | Discharge status: Home / Self Care | Payer: Medicare Other | Source: Ambulatory Visit | Attending: Radiation Oncology | Admitting: Radiation Oncology

## 2011-10-06 ENCOUNTER — Ambulatory Visit: Payer: Medicare Other

## 2011-10-06 DIAGNOSIS — C50919 Malignant neoplasm of unspecified site of unspecified female breast: Secondary | ICD-10-CM | POA: Diagnosis not present

## 2011-10-06 DIAGNOSIS — Z510 Encounter for antineoplastic radiation therapy: Secondary | ICD-10-CM | POA: Diagnosis not present

## 2011-10-06 DIAGNOSIS — Z79899 Other long term (current) drug therapy: Secondary | ICD-10-CM | POA: Diagnosis not present

## 2011-10-07 ENCOUNTER — Ambulatory Visit
Admission: RE | Admit: 2011-10-07 | Discharge: 2011-10-07 | Disposition: A | Discharge status: Home / Self Care | Payer: Medicare Other | Source: Ambulatory Visit | Attending: Radiation Oncology | Admitting: Radiation Oncology

## 2011-10-07 ENCOUNTER — Ambulatory Visit: Payer: Medicare Other

## 2011-10-07 DIAGNOSIS — Z79899 Other long term (current) drug therapy: Secondary | ICD-10-CM | POA: Diagnosis not present

## 2011-10-07 DIAGNOSIS — C50919 Malignant neoplasm of unspecified site of unspecified female breast: Secondary | ICD-10-CM | POA: Diagnosis not present

## 2011-10-07 DIAGNOSIS — Z510 Encounter for antineoplastic radiation therapy: Secondary | ICD-10-CM | POA: Diagnosis not present

## 2011-10-08 ENCOUNTER — Ambulatory Visit: Payer: Medicare Other

## 2011-10-08 ENCOUNTER — Ambulatory Visit
Admission: RE | Admit: 2011-10-08 | Discharge: 2011-10-08 | Disposition: A | Discharge status: Home / Self Care | Payer: Medicare Other | Source: Ambulatory Visit | Attending: Radiation Oncology | Admitting: Radiation Oncology

## 2011-10-08 ENCOUNTER — Other Ambulatory Visit: Payer: Self-pay | Admitting: Radiation Oncology

## 2011-10-08 ENCOUNTER — Encounter: Payer: Self-pay | Admitting: Radiation Oncology

## 2011-10-08 VITALS — BP 140/73 | HR 64 | Temp 97.9°F | Resp 20 | Wt 191.3 lb

## 2011-10-08 DIAGNOSIS — E669 Obesity, unspecified: Secondary | ICD-10-CM

## 2011-10-08 DIAGNOSIS — C50419 Malignant neoplasm of upper-outer quadrant of unspecified female breast: Secondary | ICD-10-CM

## 2011-10-08 DIAGNOSIS — Z510 Encounter for antineoplastic radiation therapy: Secondary | ICD-10-CM | POA: Diagnosis not present

## 2011-10-08 DIAGNOSIS — C50919 Malignant neoplasm of unspecified site of unspecified female breast: Secondary | ICD-10-CM | POA: Diagnosis not present

## 2011-10-08 DIAGNOSIS — Z79899 Other long term (current) drug therapy: Secondary | ICD-10-CM | POA: Diagnosis not present

## 2011-10-08 MED ORDER — OXYCODONE-ACETAMINOPHEN 5-325 MG PO TABS: 1.0000 | ORAL_TABLET | Freq: Two times a day (BID) | ORAL | Status: DC | PRN

## 2011-10-08 MED ORDER — HYDROCODONE-ACETAMINOPHEN 5-500 MG PO CAPS: 1.0000 | ORAL_CAPSULE | Freq: Four times a day (QID) | ORAL | Status: DC | PRN

## 2011-10-08 NOTE — Progress Notes (Signed)
  Radiation Oncology         [phone]) [phone] ________________________________  Name: [name] MRN: [id number]  Date: 10/08/2011  DOB: [birth year]  Telephone contact:  I received a message to call this patient and returned the phone call.  Explaining that she received a prescription for the wrong medication. It appears that she got a prescription for hydrocodone from Dr. Basilio Cairo on July 8.  She completed that prescription and asked for pain medication today. The patient's family felt that the request was mistaken for request for stronger pain medicine.   The inadvertently took a prescription for oxycodone to their pharmacy and had it filled. After it was filled, they noticed that it was not hydrocodone and called me right after the clinic closed for a prescription for hydrocodone instead. They feel that hydrocodone is stronger than oxycodone and she must have hydrocodone.  I telephoned a prescription for hydrocodone /APAP 5/500 to her pharmacy with 20 pills and no refills. ________________________________  Artist Pais Kathrynn Running, M.D.

## 2011-10-08 NOTE — Progress Notes (Signed)
Patient brought to nursing, steady gait, , alert,oriented x3, very agitated and tired"i was on that table 1 hour today why was that?", 'I wasn't sent to nursing yesterday to see MD either,I have to be in [place] at 3330pm, and I need refill on my vicodin", daughter and 2 granddaughters in with patient, gave gown to patient, paged MD to see patient ASAP, no mention of being in pain today but needs refill on her vicodin emphasized"i'm almost out" 2:10 PM

## 2011-10-08 NOTE — Progress Notes (Signed)
Department of Radiation Oncology  Phone:  [phone] Fax:        [phone]  Weekly Treatment Note    Name: [name] Date: 10/08/2011 MRN: [id number] DOB: 1960/04/07   Current dose: 18 Gy  Current fraction: 10   MEDICATIONS: Current Outpatient Prescriptions  Medication Sig Dispense Refill  . carvedilol (COREG) 25 MG tablet Take 25 mg by mouth 2 (two) times daily with a meal.        . furosemide (LASIX) 40 MG tablet Take 80-120 mg by mouth daily. 120 mg in the am and 80 mg in the pm.      . gabapentin (NEURONTIN) 100 MG capsule Take 3 capsules (300 mg total) by mouth 3 (three) times daily.  60 capsule  1  . HYDROcodone-acetaminophen (NORCO) 5-325 MG per tablet Take 1 tablet by mouth once. As needed before radiotherapy  30 tablet  0  . Ibuprofen-Diphenhydramine HCl (ADVIL PM) 200-25 MG CAPS Take 3 capsules by mouth at bedtime.        [name] levothyroxine (SYNTHROID, LEVOTHROID) 100 MCG tablet Take 100 mcg by mouth daily.        [name] lidocaine-prilocaine (EMLA) cream Apply 1 application topically as needed. For pain      . lisinopril (PRINIVIL,ZESTRIL) 20 MG tablet Take 20 mg by mouth daily.        [name] LORazepam (ATIVAN) 0.5 MG tablet TAKE ONE TABLET BY MOUTH TWICE DAILY AS NEEDED FOR NAUSEA AND ANXIETY  60 tablet  0  . magnesium oxide (MAG-OX) 400 MG tablet Take 800 mg by mouth daily.        . Melatonin 10 MG CAPS Take 40 mg by mouth at bedtime.      . nitroGLYCERIN (NITROSTAT) 0.4 MG SL tablet Place 0.4 mg under the tongue every 5 (five) minutes as needed. As needed for chest pain.      . non-metallic deodorant Thornton Papas) MISC Apply 1 application topically daily as needed.      [name] omeprazole (PRILOSEC) 20 MG capsule Take 20 mg by mouth 2 (two) times daily.       [name] OVER THE COUNTER MEDICATION Take 3 capsules by mouth every morning. Green Tea Extract OTC.       [name] oxyCODONE-acetaminophen (PERCOCET/ROXICET) 5-325 MG per tablet Take 1 tablet by mouth 2 (two) times daily as needed. As needed  for pain.  60 tablet  0  . potassium chloride SA (K-DUR,KLOR-CON) 20 MEQ tablet Take 20 mEq by mouth 2 (two) times daily.      . sertraline (ZOLOFT) 100 MG tablet Take 100 mg by mouth daily.        . sertraline (ZOLOFT) 50 MG tablet Take 50-150 mg by mouth daily as needed. Patient may take and additional 50 mg if she feels she is having a bad day      . traZODone (DESYREL) 50 MG tablet Take 50 mg by mouth at bedtime.      . Wound Cleansers (RADIAPLEX EX) Apply topically.      [name] zolpidem (AMBIEN) 10 MG tablet Take 10 mg by mouth at bedtime.        No current facility-administered medications for this encounter.   Facility-Administered Medications Ordered in Other Encounters  Medication Dose Route Frequency Provider Last Rate Last Dose  . sodium chloride 0.9 % injection 10 mL  10 mL Intracatheter PRN Lowella Dell, MD         ALLERGIES: Aspirin; Docetaxel; Morphine and related; and Penicillins  LABORATORY DATA:  Lab Results  Component Value Date   WBC 4.1 08/20/2011   HGB 10.5* 08/20/2011   HCT 32.0* 08/20/2011   MCV 91.1 08/20/2011   PLT 226 08/20/2011   Lab Results  Component Value Date   NA 142 07/06/2011   K 4.0 07/06/2011   CL 103 07/06/2011   CO2 28 07/06/2011   Lab Results  Component Value Date   ALT 15 06/22/2011   AST 21 06/22/2011   ALKPHOS 79 06/22/2011   BILITOT 0.4 06/22/2011     NARRATIVE: [name] was seen today for weekly treatment management. The chart was checked and the patient's films were reviewed. The patient was on the table for a while. She was upset about this. There was an issue in terms of her set up and this had to be rechecked with additional films. Otherwise the patient appears to be doing well. She needs a refill on her pain medication.  PHYSICAL EXAMINATION: weight is 191 lb 4.8 oz (86.773 kg). Her oral temperature is 97.9 F (36.6 C). Her blood pressure is 140/73 and her pulse is 64. Her respiration is 20.      some hyperpigmentation without  desquamation  ASSESSMENT: The patient is doing satisfactorily with treatment.  PLAN: We will continue with the patient's radiation treatment as planned. A refill was given for Percocet.

## 2011-10-11 ENCOUNTER — Ambulatory Visit: Payer: Medicare Other

## 2011-10-11 ENCOUNTER — Encounter: Payer: Self-pay | Admitting: Radiation Oncology

## 2011-10-11 ENCOUNTER — Ambulatory Visit
Admission: RE | Admit: 2011-10-11 | Discharge: 2011-10-11 | Disposition: A | Discharge status: Home / Self Care | Payer: Medicare Other | Source: Ambulatory Visit | Attending: Radiation Oncology | Admitting: Radiation Oncology

## 2011-10-11 VITALS — BP 143/82 | HR 62 | Temp 98.2°F | Resp 20 | Wt 192.2 lb

## 2011-10-11 DIAGNOSIS — C50919 Malignant neoplasm of unspecified site of unspecified female breast: Secondary | ICD-10-CM

## 2011-10-11 DIAGNOSIS — Z79899 Other long term (current) drug therapy: Secondary | ICD-10-CM | POA: Diagnosis not present

## 2011-10-11 DIAGNOSIS — C50419 Malignant neoplasm of upper-outer quadrant of unspecified female breast: Secondary | ICD-10-CM | POA: Diagnosis not present

## 2011-10-11 DIAGNOSIS — Z510 Encounter for antineoplastic radiation therapy: Secondary | ICD-10-CM | POA: Diagnosis not present

## 2011-10-11 DIAGNOSIS — E669 Obesity, unspecified: Secondary | ICD-10-CM

## 2011-10-11 MED ORDER — HYDROCODONE-ACETAMINOPHEN 5-500 MG PO CAPS: 1.0000 | ORAL_CAPSULE | Freq: Four times a day (QID) | ORAL | Status: AC | PRN

## 2011-10-11 NOTE — Progress Notes (Signed)
Patient arrived, tearful alert,oriented x3, had rad tx  Left sclavf,11/33, c/o left pain in shoulder while lying on the table , doing physical therapy exercises 2x day, taking vicodin prn, took 2 pills before she left [place] today, 3rd pill when arrived in waiting room  Before rad tx, has had a lot of pain over the weekend,wants to see MD slight pigmentation on skin, skin intact Eating okay , drinking plenty water stated 1:52 PM

## 2011-10-11 NOTE — Progress Notes (Signed)
Weekly Management Note:  Site:L Breast/regional LNs Current Dose:  1980  cGy Projected Dose: 1980  cGy  Narrative: The patient is seen today for routine under treatment assessment. CBCT/MVCT images/port films were reviewed. The chart was reviewed.   She seen today requesting a refill of her hydrocodone/APAP for her left axillary discomfort which she has had since her surgery. She was given a prescription of oxycodone last week by Dr. Mitzi Hansen and this was filled. After she got home she realized that she had oxycodone instead of hydrocodone. She does not feel that oxycodone is of any benefit since she has been on this for 3 years for chronic back pain. She is improved on hydrocodone. She has only 3 tablets left. Dr. Kathrynn Running: The prescription over the weekend.  Physical Examination: She is teary-eyed. Filed Vitals:   10/11/11 1347  BP: 143/82  Pulse: 62  Temp: 98.2 F (36.8 C)  Resp: 20  .  Weight: 192 lb 3.2 oz (87.181 kg). There is mild hyperpigmentation the skin along the left breast/axilla. No masses are appreciated. Pain is described along the posterior aspect of her axillary scar.  Impression: Tolerating radiation therapy well,  she has postoperative axillary pain which has responded to hydrocodone.  Plan: Continue radiation therapy as planned. I wrote a prescription for hydrocodone/APAP (Lorcet) dispensed #40 with no refills. I told her that further refills would need to be made by Dr. Mitzi Hansen or Dr. Magnus Ivan.

## 2011-10-12 ENCOUNTER — Ambulatory Visit: Payer: Medicare Other

## 2011-10-12 ENCOUNTER — Ambulatory Visit
Admission: RE | Admit: 2011-10-12 | Discharge: 2011-10-12 | Disposition: A | Discharge status: Home / Self Care | Payer: Medicare Other | Source: Ambulatory Visit | Attending: Radiation Oncology | Admitting: Radiation Oncology

## 2011-10-12 DIAGNOSIS — Z510 Encounter for antineoplastic radiation therapy: Secondary | ICD-10-CM | POA: Diagnosis not present

## 2011-10-12 DIAGNOSIS — Z79899 Other long term (current) drug therapy: Secondary | ICD-10-CM | POA: Diagnosis not present

## 2011-10-12 DIAGNOSIS — C50919 Malignant neoplasm of unspecified site of unspecified female breast: Secondary | ICD-10-CM | POA: Diagnosis not present

## 2011-10-13 ENCOUNTER — Ambulatory Visit
Admission: RE | Admit: 2011-10-13 | Discharge: 2011-10-13 | Disposition: A | Discharge status: Home / Self Care | Payer: Medicare Other | Source: Ambulatory Visit | Attending: Radiation Oncology | Admitting: Radiation Oncology

## 2011-10-13 ENCOUNTER — Ambulatory Visit: Payer: Medicare Other

## 2011-10-13 DIAGNOSIS — C50919 Malignant neoplasm of unspecified site of unspecified female breast: Secondary | ICD-10-CM | POA: Diagnosis not present

## 2011-10-13 DIAGNOSIS — Z510 Encounter for antineoplastic radiation therapy: Secondary | ICD-10-CM | POA: Diagnosis not present

## 2011-10-13 DIAGNOSIS — Z79899 Other long term (current) drug therapy: Secondary | ICD-10-CM | POA: Diagnosis not present

## 2011-10-14 ENCOUNTER — Ambulatory Visit
Admission: RE | Admit: 2011-10-14 | Discharge: 2011-10-14 | Disposition: A | Discharge status: Home / Self Care | Payer: Medicare Other | Source: Ambulatory Visit | Attending: Radiation Oncology | Admitting: Radiation Oncology

## 2011-10-14 ENCOUNTER — Other Ambulatory Visit: Payer: Self-pay | Admitting: Oncology

## 2011-10-14 ENCOUNTER — Ambulatory Visit: Payer: Medicare Other

## 2011-10-14 DIAGNOSIS — Z510 Encounter for antineoplastic radiation therapy: Secondary | ICD-10-CM | POA: Diagnosis not present

## 2011-10-14 DIAGNOSIS — C50419 Malignant neoplasm of upper-outer quadrant of unspecified female breast: Secondary | ICD-10-CM

## 2011-10-14 DIAGNOSIS — Z79899 Other long term (current) drug therapy: Secondary | ICD-10-CM | POA: Diagnosis not present

## 2011-10-14 DIAGNOSIS — C50919 Malignant neoplasm of unspecified site of unspecified female breast: Secondary | ICD-10-CM | POA: Diagnosis not present

## 2011-10-14 NOTE — Progress Notes (Signed)
HERE TODAY FOR PUT OF LEFT BREAST.  SKIN LIGHT RED IN COLOR WITH NO DESQUAMATION NOTED.  C/O DISCOMFORT AT LEFT NIPPLE, SAYS FEEL LIKE SOMEBODY JUST PUSHING ON HER NIPPLE.  USING RADIAPLEX

## 2011-10-14 NOTE — Progress Notes (Signed)
Department of Radiation Oncology  Phone:  [phone] Fax:        [phone]  Weekly Treatment Note    Name: [name] Date: 10/14/2011 MRN: [id number] DOB: Dec 01, 1960   Current dose: 25.2 Gy  Current fraction: 14   MEDICATIONS: Current Outpatient Prescriptions  Medication Sig Dispense Refill  . carvedilol (COREG) 25 MG tablet Take 25 mg by mouth 2 (two) times daily with a meal.        . furosemide (LASIX) 40 MG tablet Take 80-120 mg by mouth daily. 120 mg in the am and 80 mg in the pm.      . gabapentin (NEURONTIN) 100 MG capsule Take 3 capsules (300 mg total) by mouth 3 (three) times daily.  60 capsule  1  . hydrocodone-acetaminophen (LORCET-HD) 5-500 MG per capsule Take 1 capsule by mouth every 6 (six) hours as needed for pain.  30 capsule  0  . hydrocodone-acetaminophen (LORCET-HD) 5-500 MG per capsule Take 1 capsule by mouth every 6 (six) hours as needed for pain.  40 capsule  0  . Ibuprofen-Diphenhydramine HCl (ADVIL PM) 200-25 MG CAPS Take 3 capsules by mouth at bedtime.        [name] levothyroxine (SYNTHROID, LEVOTHROID) 100 MCG tablet Take 100 mcg by mouth daily.        [name] lidocaine-prilocaine (EMLA) cream Apply 1 application topically as needed. For pain      . lisinopril (PRINIVIL,ZESTRIL) 20 MG tablet Take 20 mg by mouth daily.        [name] LORazepam (ATIVAN) 0.5 MG tablet TAKE ONE TABLET BY MOUTH TWICE DAILY AS NEEDED FOR NAUSEA AND ANXIETY  60 tablet  0  . magnesium oxide (MAG-OX) 400 MG tablet Take 800 mg by mouth daily.        . Melatonin 10 MG CAPS Take 40 mg by mouth at bedtime.      . nitroGLYCERIN (NITROSTAT) 0.4 MG SL tablet Place 0.4 mg under the tongue every 5 (five) minutes as needed. As needed for chest pain.      . non-metallic deodorant Thornton Papas) MISC Apply 1 application topically daily as needed.      [name] omeprazole (PRILOSEC) 20 MG capsule Take 20 mg by mouth 2 (two) times daily.       [name] OVER THE COUNTER MEDICATION Take 3 capsules by mouth every morning. Green  Tea Extract OTC.       [name] oxyCODONE-acetaminophen (PERCOCET/ROXICET) 5-325 MG per tablet Take 1 tablet by mouth 2 (two) times daily as needed. As needed for pain.  60 tablet  0  . potassium chloride SA (K-DUR,KLOR-CON) 20 MEQ tablet Take 20 mEq by mouth 2 (two) times daily.      . sertraline (ZOLOFT) 100 MG tablet Take 100 mg by mouth daily.        . sertraline (ZOLOFT) 50 MG tablet Take 50-150 mg by mouth daily as needed. Patient may take and additional 50 mg if she feels she is having a bad day      . traZODone (DESYREL) 50 MG tablet Take 50 mg by mouth at bedtime.      . Wound Cleansers (RADIAPLEX EX) Apply topically.      [name] zolpidem (AMBIEN) 10 MG tablet Take 10 mg by mouth at bedtime.        No current facility-administered medications for this encounter.   Facility-Administered Medications Ordered in Other Encounters  Medication Dose Route Frequency Provider Last Rate Last Dose  . sodium chloride 0.9 %  injection 10 mL  10 mL Intracatheter PRN Lowella Dell, MD         ALLERGIES: Aspirin; Docetaxel; Morphine and related; and Penicillins   LABORATORY DATA:  Lab Results  Component Value Date   WBC 4.1 08/20/2011   HGB 10.5* 08/20/2011   HCT 32.0* 08/20/2011   MCV 91.1 08/20/2011   PLT 226 08/20/2011   Lab Results  Component Value Date   NA 142 07/06/2011   K 4.0 07/06/2011   CL 103 07/06/2011   CO2 28 07/06/2011   Lab Results  Component Value Date   ALT 15 06/22/2011   AST 21 06/22/2011   ALKPHOS 79 06/22/2011   BILITOT 0.4 06/22/2011     NARRATIVE: [name] was seen today for weekly treatment management. The chart was checked and the patient's films were reviewed. Patient is doing better today. Some discomfort underneath the nipple which she noticed last night. She is continuing to use skin cream.  PHYSICAL EXAMINATION: vitals were not taken for this visit.     some hyperpigmentation is emerging in the treatment area. No desquamation. Overall her skin looks very  good.  ASSESSMENT: The patient is doing satisfactorily with treatment.  PLAN: We will continue with the patient's radiation treatment as planned. The patient's skin looks good. I believe that she is beginning to have some shooting pains and I discussed this with her in the postoperative setting. I don't believe that this will be an ongoing issue.

## 2011-10-15 ENCOUNTER — Ambulatory Visit: Payer: Medicare Other

## 2011-10-15 ENCOUNTER — Ambulatory Visit
Admission: RE | Admit: 2011-10-15 | Discharge: 2011-10-15 | Disposition: A | Discharge status: Home / Self Care | Payer: Medicare Other | Source: Ambulatory Visit | Attending: Radiation Oncology | Admitting: Radiation Oncology

## 2011-10-15 DIAGNOSIS — Z510 Encounter for antineoplastic radiation therapy: Secondary | ICD-10-CM | POA: Diagnosis not present

## 2011-10-15 DIAGNOSIS — C50919 Malignant neoplasm of unspecified site of unspecified female breast: Secondary | ICD-10-CM | POA: Diagnosis not present

## 2011-10-15 DIAGNOSIS — Z79899 Other long term (current) drug therapy: Secondary | ICD-10-CM | POA: Diagnosis not present

## 2011-10-18 ENCOUNTER — Ambulatory Visit: Payer: Medicare Other

## 2011-10-18 ENCOUNTER — Ambulatory Visit
Admission: RE | Admit: 2011-10-18 | Discharge: 2011-10-18 | Disposition: A | Discharge status: Home / Self Care | Payer: Medicare Other | Source: Ambulatory Visit | Attending: Radiation Oncology | Admitting: Radiation Oncology

## 2011-10-18 DIAGNOSIS — C50419 Malignant neoplasm of upper-outer quadrant of unspecified female breast: Secondary | ICD-10-CM | POA: Diagnosis not present

## 2011-10-18 DIAGNOSIS — Z510 Encounter for antineoplastic radiation therapy: Secondary | ICD-10-CM | POA: Diagnosis not present

## 2011-10-18 DIAGNOSIS — C50919 Malignant neoplasm of unspecified site of unspecified female breast: Secondary | ICD-10-CM | POA: Diagnosis not present

## 2011-10-18 DIAGNOSIS — Z79899 Other long term (current) drug therapy: Secondary | ICD-10-CM | POA: Diagnosis not present

## 2011-10-19 ENCOUNTER — Ambulatory Visit: Payer: Medicare Other

## 2011-10-19 ENCOUNTER — Ambulatory Visit
Admission: RE | Admit: 2011-10-19 | Discharge: 2011-10-19 | Disposition: A | Discharge status: Home / Self Care | Payer: Medicare Other | Source: Ambulatory Visit | Attending: Radiation Oncology | Admitting: Radiation Oncology

## 2011-10-19 DIAGNOSIS — Z510 Encounter for antineoplastic radiation therapy: Secondary | ICD-10-CM | POA: Diagnosis not present

## 2011-10-19 DIAGNOSIS — Z79899 Other long term (current) drug therapy: Secondary | ICD-10-CM | POA: Diagnosis not present

## 2011-10-19 DIAGNOSIS — C50919 Malignant neoplasm of unspecified site of unspecified female breast: Secondary | ICD-10-CM | POA: Diagnosis not present

## 2011-10-20 ENCOUNTER — Ambulatory Visit
Admission: RE | Admit: 2011-10-20 | Discharge: 2011-10-20 | Disposition: A | Discharge status: Home / Self Care | Payer: Medicare Other | Source: Ambulatory Visit | Attending: Radiation Oncology | Admitting: Radiation Oncology

## 2011-10-20 ENCOUNTER — Telehealth: Payer: Self-pay | Admitting: *Deleted

## 2011-10-20 ENCOUNTER — Other Ambulatory Visit (HOSPITAL_BASED_OUTPATIENT_CLINIC_OR_DEPARTMENT_OTHER): Payer: Medicare Other | Admitting: Lab

## 2011-10-20 ENCOUNTER — Ambulatory Visit (HOSPITAL_BASED_OUTPATIENT_CLINIC_OR_DEPARTMENT_OTHER): Payer: Medicare Other | Admitting: Oncology

## 2011-10-20 VITALS — BP 145/85 | HR 68 | Temp 98.5°F | Ht 59.0 in | Wt 191.3 lb

## 2011-10-20 DIAGNOSIS — C50919 Malignant neoplasm of unspecified site of unspecified female breast: Secondary | ICD-10-CM | POA: Diagnosis not present

## 2011-10-20 DIAGNOSIS — Z79899 Other long term (current) drug therapy: Secondary | ICD-10-CM | POA: Diagnosis not present

## 2011-10-20 DIAGNOSIS — E559 Vitamin D deficiency, unspecified: Secondary | ICD-10-CM | POA: Diagnosis not present

## 2011-10-20 DIAGNOSIS — Z510 Encounter for antineoplastic radiation therapy: Secondary | ICD-10-CM | POA: Diagnosis not present

## 2011-10-20 LAB — CBC WITH DIFFERENTIAL/PLATELET
Basophils Absolute: 0 10*3/uL (ref 0.0–0.1)
EOS%: 0.2 % (ref 0.0–7.0)
HCT: 33.7 % — ABNORMAL LOW (ref 34.8–46.6)
HGB: 11.2 g/dL — ABNORMAL LOW (ref 11.6–15.9)
LYMPH%: 29.2 % (ref 14.0–49.7)
MCH: 28.5 pg (ref 25.1–34.0)
MCV: 85.3 fL (ref 79.5–101.0)
MONO%: 8.8 % (ref 0.0–14.0)
NEUT%: 61.4 % (ref 38.4–76.8)
Platelets: 196 10*3/uL (ref 145–400)
RDW: 14.8 % — ABNORMAL HIGH (ref 11.2–14.5)

## 2011-10-20 MED ORDER — SERTRALINE HCL 50 MG PO TABS: 50.0000 mg | ORAL_TABLET | Freq: Every day | ORAL | Status: DC

## 2011-10-20 NOTE — Telephone Encounter (Signed)
Mailed out calendar to inform the patient of the new date and time 

## 2011-10-20 NOTE — Progress Notes (Signed)
Hematology and Oncology Follow Up Visit  [name] [id number] Aug 05, 1960 51 y.o. 06/29/11    HPI: Donnisha is a 51 year old [place] [facility] [place] woman with a locally advanced ER/PR positive at 99/2% respectively, HER-2 positive, cish ratio of 2.87, left breast carcinoma with biopsy-proven lymph node involvement. Initial staging breast MRI on 02/05/2011 showing a mass measuring 4.9 cm with multiple small nodules without fatty hila in the left axilla suspicious for metastatic disease. She completed 2 cycles of every 3 week neoadjuvant Taxotere/carboplatin, due to significant difficulty with the Taxotere component based i.e. significant PPE changes, and chest pain with her second infusion, her treatment course was switched to every 3 week IV CMF/Herceptin. Currently day 7 cycle 3/4 of every 3 week IV CMF, Herceptin held due to worsening left ventricular ejection fraction. 2. History of congestive heart failure, echocardiogram from 06/15/2011 revealing a left ventricular ejection fraction between 35 and 40%. Also on Lasix 120 mg in the a.m., 80 mg in the p.m. HCTZ 25 mg by mouth daily, lisinopril 20 mg by mouth daily, Coreg 25 mg by mouth twice a day 3. Hypothyroidism-on Synthroid 100 mcg daily 4. COPD 5. GERD on Prilosec 20 mg by mouth twice a day 6. Depression on Zoloft 100 mg by mouth daily 7. Reported history of myocardial infarction 7 years ago.  Interim History:   Ms. Mckiver is seen today with her daughter and accompaniment for followup after completion cycle 4/4 every 3 week IV CMF given in the neoadjuvant setting with Herceptin being held due to CHF. She underwent repeat breast MRI on 06/29/2011, of note she has been seen by Dr. Rayburn Ma and had surgery on 07/16/2011. We reviewed pathology which showed a residual 2 cm tumor, in  A background of scarring and fibrosis. 11 lymph nodes were all negative.Residual tumor was er, pr + and Her2+.she has had an unremarkable post op course. A detailed  review of systems is otherwise noncontributory as noted below.  Review of Systems: Constitutional: Fatigue. Eyes: excessive tearing ENT:  Cardiovasc complains of mouth soreness and "puffiness" but denies any difficulty swallowing per se. Cor: Denies shortness of breath. Respiratory: positive for - shortness of breath Neurological: no TIA or stroke symptoms Dermatological: negative Gastrointestinal: no abdominal pain, change in bowel habits, or black or bloody stools Genito-Urinary: no dysuria, trouble voiding, or hematuria Hematological and Lymphatic: Negative. Breast: negative for breast lumps Patient unable to palpate L breast mass. Musculoskeletal: negative Remaining ROS negative.   Medications:   I have reviewed the patient's current medications.  Current Outpatient Prescriptions  Medication Sig Dispense Refill  . carvedilol (COREG) 25 MG tablet Take 25 mg by mouth 2 (two) times daily with a meal.        . furosemide (LASIX) 40 MG tablet Take 80-120 mg by mouth daily. 120 mg in the am and 80 mg in the pm.      . gabapentin (NEURONTIN) 100 MG capsule Take 3 capsules (300 mg total) by mouth 3 (three) times daily.  60 capsule  1  . hydrocodone-acetaminophen (LORCET-HD) 5-500 MG per capsule Take 1 capsule by mouth every 6 (six) hours as needed for pain.  30 capsule  0  . hydrocodone-acetaminophen (LORCET-HD) 5-500 MG per capsule Take 1 capsule by mouth every 6 (six) hours as needed for pain.  40 capsule  0  . Ibuprofen-Diphenhydramine HCl (ADVIL PM) 200-25 MG CAPS Take 3 capsules by mouth at bedtime.        [name] levothyroxine (SYNTHROID, LEVOTHROID)  100 MCG tablet Take 100 mcg by mouth daily.        [name] lidocaine-prilocaine (EMLA) cream Apply 1 application topically as needed. For pain      . lisinopril (PRINIVIL,ZESTRIL) 20 MG tablet Take 20 mg by mouth daily.        [name] LORazepam (ATIVAN) 0.5 MG tablet TAKE ONE TABLET BY MOUTH TWICE DAILY AS NEEDED FOR NAUSEA AND ANXIETY  60 tablet  0    . magnesium oxide (MAG-OX) 400 MG tablet Take 800 mg by mouth daily.        . nitroGLYCERIN (NITROSTAT) 0.4 MG SL tablet Place 0.4 mg under the tongue every 5 (five) minutes as needed. As needed for chest pain.      . non-metallic deodorant Thornton Papas) MISC Apply 1 application topically daily as needed.      [name] omeprazole (PRILOSEC) 20 MG capsule Take 20 mg by mouth 2 (two) times daily.       [name] OVER THE COUNTER MEDICATION Take 3 capsules by mouth every morning. Green Tea Extract OTC.       [name] oxyCODONE-acetaminophen (PERCOCET/ROXICET) 5-325 MG per tablet Take 1 tablet by mouth 2 (two) times daily as needed. As needed for pain.  60 tablet  0  . potassium chloride SA (K-DUR,KLOR-CON) 20 MEQ tablet Take 20 mEq by mouth 2 (two) times daily.      . sertraline (ZOLOFT) 100 MG tablet Take 100 mg by mouth daily.        . sertraline (ZOLOFT) 50 MG tablet Take 50-150 mg by mouth daily as needed. Patient may take and additional 50 mg if she feels she is having a bad day      . traZODone (DESYREL) 50 MG tablet Take 50 mg by mouth at bedtime.      . traZODone (DESYREL) 50 MG tablet TAKE ONE TABLET BY MOUTH EVERY DAY AT BEDTIME  30 tablet  0  . Wound Cleansers (RADIAPLEX EX) Apply topically.      [name] zolpidem (AMBIEN) 10 MG tablet Take 10 mg by mouth at bedtime.       . Melatonin 10 MG CAPS Take 40 mg by mouth at bedtime.       No current facility-administered medications for this visit.   Facility-Administered Medications Ordered in Other Visits  Medication Dose Route Frequency Provider Last Rate Last Dose  . sodium chloride 0.9 % injection 10 mL  10 mL Intracatheter PRN Lowella Dell, MD        Allergies:  Allergies  Allergen Reactions  . Aspirin Hives  . Docetaxel Other (See Comments)    Chest tightness and pain  . Morphine And Related Hives and Swelling  . Penicillins Hives    Physical Exam: Filed Vitals:   10/20/11 1445  BP: 145/85  Pulse: 68  Temp: 98.5 F (36.9 C)  Weight: 197  lbs. HEENT:  Sclerae Slightly injected but no evidence of any today., conjunctivae pink,  Oropharynx shows evidence of possible blistering which is very vague in the buccal mucosa. Question viral etiology.  No frank evidence to suggest oral mucositis or candidiasis.   Nodes:  No cervical, supraclavicular, or axillary lymphadenopathy palpated.  Breast Exam: lt breast , s/p lumpectomy with well healed scar Lungs: Clear to auscultation bilaterally.  No crackles, rhonchi, or wheezes.   Heart:  Regular rate and rhythm, mild right-sided JVD. Abdomen:  Soft, nontender.  Positive bowel sounds.  No organomegaly or masses palpated.   Musculoskeletal:  No focal spinal  tenderness to palpation, she does have some tenderness over her left shoulder region, no evidence of any palpable cords erythema or lymphedema.  Extremities:  Benign.  No peripheral edema or cyanosis.   Skin:  Benign.   Neuro:  Nonfocal, alert and oriented x 3.   Lab Results: Lab Results  Component Value Date   WBC 2.8* 10/20/2011   HGB 11.2* 10/20/2011   HCT 33.7* 10/20/2011   MCV 85.3 10/20/2011   PLT 196 10/20/2011   NEUTROABS 1.7 10/20/2011     Chemistry      Component Value Date/Time   NA 142 07/06/2011 1433   K 4.0 07/06/2011 1433   CL 103 07/06/2011 1433   CO2 28 07/06/2011 1433   BUN 16 07/06/2011 1433   CREATININE 1.34* 07/06/2011 1433      Component Value Date/Time   CALCIUM 9.5 07/06/2011 1433   ALKPHOS 79 06/22/2011 1106   AST 21 06/22/2011 1106   ALT 15 06/22/2011 1106   BILITOT 0.4 06/22/2011 1106     Echocardiogram: 06/15/11 Study Conclusions - Left ventricle: The cavity size was normal. Systolic function was moderately reduced. The estimated ejection fraction was in the range of 35% to 40%. Diffuse hypokinesis. - Ventricular septum: Septal motion showed abnormal function and dyssynergy. Transthoracic echocardiography. M-mode, complete 2D, spectral Doppler, and color Doppler. Patient status: Inpatient. Location: Echo  laboratory. Prepared and Electronically Authenticated by Everette Rank, MD     Assessment:  Gaynel is a 51 year old [place] [facility] [place] woman with a locally advanced ER/PR positive at 99/2% respectively, HER-2 positive with the cish ratio of 2.87, left breast carcinoma with biopsy-proven lymph node involvement. Initial staging breast MRI on 02/05/2011 showing a mass measuring 4.9 cm with multiple small nodules without fatty hila in the left axilla suspicious for metastatic disease. She completed 2 cycles of every 3 week neoadjuvant Taxotere/carboplatin, due to significant difficulty with the Taxotere component based i.e. significant PPE changes, and chest pain with her second infusion, her treatment course was switched to every 3 week IV CMF/Herceptin. Currently  day 1,cycle 4/4 of every 3 week IV CMF/Herceptin.   2. History of severe afebrile neutropenia despite Neulasta on day 2.  3. History of congestive heart failure, echocardiogram from 01/23 2013 revealing a left ventricular ejection fraction between 50 and 55%. Also on Lasix 120 mg in the a.m., 80 mg in the p.m. HCTZ 25 mg by mouth daily, lisinopril 20 mg by mouth daily, Coreg 25 mg by mouth twice a day. Worsening left ventricular ejection fraction reported between 35-40% on echocardiogram obtained on 06/15/2011. Patient is clinically asymptomatic.  4. Hypothyroidism-on Synthroid 100 mcg daily 5. COPD  6. GERD on Prilosec 20 mg by mouth twice a day  7. Depression on Zoloft 100 mg by mouth daily  8. History of symptomatic anemia, s/p 2 units of packed RBCs given on 05/21/2011.  Case has been reviewed with  Dr. Pierce Crane, who also examined patient.  Plan:  Tawnia is doing well, she has had a good response to treatrment, but unfortunately cannot take more herceptin.She has had a hysterectomy and has 1 ovary, so we will need to check her hormone levels before putting her on an AI. She has seen Dr Mitzi Hansen and will be starting xrt  shortly. I will see her after, and determine which hormone to give her. She will need a bone density test as well. Her port can be removed after xrt has been completed. She is having problems sleeping despite  what she is taking and I suggested trazodone.  Pierce Crane MD h Pierce Crane, 06/29/11

## 2011-10-21 ENCOUNTER — Ambulatory Visit
Admission: RE | Admit: 2011-10-21 | Discharge: 2011-10-21 | Disposition: A | Discharge status: Home / Self Care | Payer: Medicare Other | Source: Ambulatory Visit | Attending: Radiation Oncology | Admitting: Radiation Oncology

## 2011-10-21 DIAGNOSIS — C50919 Malignant neoplasm of unspecified site of unspecified female breast: Secondary | ICD-10-CM | POA: Diagnosis not present

## 2011-10-21 DIAGNOSIS — Z510 Encounter for antineoplastic radiation therapy: Secondary | ICD-10-CM | POA: Diagnosis not present

## 2011-10-21 DIAGNOSIS — C50419 Malignant neoplasm of upper-outer quadrant of unspecified female breast: Secondary | ICD-10-CM

## 2011-10-21 DIAGNOSIS — Z79899 Other long term (current) drug therapy: Secondary | ICD-10-CM | POA: Diagnosis not present

## 2011-10-21 LAB — COMPREHENSIVE METABOLIC PANEL
AST: 22 U/L (ref 0–37)
Alkaline Phosphatase: 76 U/L (ref 39–117)
BUN: 22 mg/dL (ref 6–23)
Creatinine, Ser: 1.32 mg/dL — ABNORMAL HIGH (ref 0.50–1.10)
Glucose, Bld: 128 mg/dL — ABNORMAL HIGH (ref 70–99)
Total Bilirubin: 0.3 mg/dL (ref 0.3–1.2)

## 2011-10-21 LAB — FOLLICLE STIMULATING HORMONE: FSH: 43 m[IU]/mL

## 2011-10-21 MED ORDER — BIAFINE EX EMUL
Freq: Every day | CUTANEOUS | Status: DC
  Administered 2011-10-21: 13:00:00 via TOPICAL

## 2011-10-22 ENCOUNTER — Ambulatory Visit
Admission: RE | Admit: 2011-10-22 | Discharge: 2011-10-22 | Disposition: A | Discharge status: Home / Self Care | Payer: Medicare Other | Source: Ambulatory Visit | Attending: Radiation Oncology | Admitting: Radiation Oncology

## 2011-10-22 ENCOUNTER — Encounter: Payer: Self-pay | Admitting: Radiation Oncology

## 2011-10-22 VITALS — BP 142/90 | HR 67 | Temp 97.9°F | Resp 20 | Wt 191.2 lb

## 2011-10-22 DIAGNOSIS — Z79899 Other long term (current) drug therapy: Secondary | ICD-10-CM | POA: Diagnosis not present

## 2011-10-22 DIAGNOSIS — Z510 Encounter for antineoplastic radiation therapy: Secondary | ICD-10-CM | POA: Diagnosis not present

## 2011-10-22 DIAGNOSIS — C50419 Malignant neoplasm of upper-outer quadrant of unspecified female breast: Secondary | ICD-10-CM

## 2011-10-22 DIAGNOSIS — C50919 Malignant neoplasm of unspecified site of unspecified female breast: Secondary | ICD-10-CM | POA: Diagnosis not present

## 2011-10-22 NOTE — Progress Notes (Signed)
Department of Radiation Oncology  Phone:  [phone] Fax:        [phone]  Weekly Treatment Note    Name: [name] Date: 10/22/2011 MRN: [id number] DOB: 28-Jan-1961   Current dose: 36 Gy  Current fraction: 20   MEDICATIONS: Current Outpatient Prescriptions  Medication Sig Dispense Refill  . carvedilol (COREG) 25 MG tablet Take 25 mg by mouth 2 (two) times daily with a meal.        . furosemide (LASIX) 40 MG tablet Take 80-120 mg by mouth daily. 120 mg in the am and 80 mg in the pm.      . gabapentin (NEURONTIN) 100 MG capsule Take 3 capsules (300 mg total) by mouth 3 (three) times daily.  60 capsule  1  . hydrocodone-acetaminophen (LORCET-HD) 5-500 MG per capsule Take 1 capsule by mouth every 6 (six) hours as needed for pain.  30 capsule  0  . hydrocodone-acetaminophen (LORCET-HD) 5-500 MG per capsule Take 1 capsule by mouth every 6 (six) hours as needed for pain.  40 capsule  0  . Ibuprofen-Diphenhydramine HCl (ADVIL PM) 200-25 MG CAPS Take 3 capsules by mouth at bedtime.        [name] levothyroxine (SYNTHROID, LEVOTHROID) 100 MCG tablet Take 100 mcg by mouth daily.        [name] lidocaine-prilocaine (EMLA) cream Apply 1 application topically as needed. For pain      . lisinopril (PRINIVIL,ZESTRIL) 20 MG tablet Take 20 mg by mouth daily.        [name] LORazepam (ATIVAN) 0.5 MG tablet TAKE ONE TABLET BY MOUTH TWICE DAILY AS NEEDED FOR NAUSEA AND ANXIETY  60 tablet  0  . magnesium oxide (MAG-OX) 400 MG tablet Take 800 mg by mouth daily.        . Melatonin 10 MG CAPS Take 40 mg by mouth at bedtime.      . nitroGLYCERIN (NITROSTAT) 0.4 MG SL tablet Place 0.4 mg under the tongue every 5 (five) minutes as needed. As needed for chest pain.      . non-metallic deodorant Thornton Papas) MISC Apply 1 application topically daily as needed.      [name] omeprazole (PRILOSEC) 20 MG capsule Take 20 mg by mouth 2 (two) times daily.       [name] OVER THE COUNTER MEDICATION Take 3 capsules by mouth every morning. Green  Tea Extract OTC.       [name] oxyCODONE-acetaminophen (PERCOCET/ROXICET) 5-325 MG per tablet Take 1 tablet by mouth 2 (two) times daily as needed. As needed for pain.  60 tablet  0  . potassium chloride SA (K-DUR,KLOR-CON) 20 MEQ tablet Take 20 mEq by mouth 2 (two) times daily.      . sertraline (ZOLOFT) 100 MG tablet Take 100 mg by mouth daily.        . sertraline (ZOLOFT) 50 MG tablet Take 1 tablet (50 mg total) by mouth daily. Patient may take and additional 50 mg if she feels she is having a bad day  30 tablet  3  . traZODone (DESYREL) 50 MG tablet Take 50 mg by mouth at bedtime.      . traZODone (DESYREL) 50 MG tablet TAKE ONE TABLET BY MOUTH EVERY DAY AT BEDTIME  30 tablet  0  . Wound Cleansers (RADIAPLEX EX) Apply topically.      [name] zolpidem (AMBIEN) 10 MG tablet Take 10 mg by mouth at bedtime.        No current facility-administered medications for this encounter.  Facility-Administered Medications Ordered in Other Encounters  Medication Dose Route Frequency Provider Last Rate Last Dose  . sodium chloride 0.9 % injection 10 mL  10 mL Intracatheter PRN Lowella Dell, MD      . DISCONTD: topical emolient (BIAFINE) emulsion   Topical Daily Jonna Coup, MD         ALLERGIES: Aspirin; Docetaxel; Morphine and related; and Penicillins   LABORATORY DATA:  Lab Results  Component Value Date   WBC 2.8* 10/20/2011   HGB 11.2* 10/20/2011   HCT 33.7* 10/20/2011   MCV 85.3 10/20/2011   PLT 196 10/20/2011   Lab Results  Component Value Date   NA 135 10/20/2011   K 4.2 10/20/2011   CL 100 10/20/2011   CO2 24 10/20/2011   Lab Results  Component Value Date   ALT 20 10/20/2011   AST 22 10/20/2011   ALKPHOS 76 10/20/2011   BILITOT 0.3 10/20/2011     NARRATIVE: DELENE MORAIS was seen today for weekly treatment management. The chart was checked and the patient's films were reviewed. The patient is doing well. She has no significant complaints. No itching or burning of the skin to any significant  degree.  PHYSICAL EXAMINATION: weight is 191 lb 3.2 oz (86.728 kg). Her oral temperature is 97.9 F (36.6 C). Her blood pressure is 142/90 and her pulse is 67. Her respiration is 20.      diffuse hyperpigmentation present in the treatment area. No desquamation. Overall her skin looks quite good  ASSESSMENT: The patient is doing satisfactorily with treatment.  PLAN: We will continue with the patient's radiation treatment as planned.

## 2011-10-22 NOTE — Progress Notes (Signed)
Pt denies loss of appetite, does have fatigue. Applying Radiaplex lotion, hyperpigmentation in tx area of left breast.

## 2011-10-25 ENCOUNTER — Ambulatory Visit
Admission: RE | Admit: 2011-10-25 | Discharge: 2011-10-25 | Disposition: A | Discharge status: Home / Self Care | Payer: Medicare Other | Source: Ambulatory Visit | Attending: Radiation Oncology | Admitting: Radiation Oncology

## 2011-10-25 DIAGNOSIS — C50919 Malignant neoplasm of unspecified site of unspecified female breast: Secondary | ICD-10-CM | POA: Diagnosis not present

## 2011-10-25 DIAGNOSIS — C50419 Malignant neoplasm of upper-outer quadrant of unspecified female breast: Secondary | ICD-10-CM | POA: Diagnosis not present

## 2011-10-25 DIAGNOSIS — Z510 Encounter for antineoplastic radiation therapy: Secondary | ICD-10-CM | POA: Diagnosis not present

## 2011-10-25 DIAGNOSIS — Z79899 Other long term (current) drug therapy: Secondary | ICD-10-CM | POA: Diagnosis not present

## 2011-10-26 ENCOUNTER — Ambulatory Visit
Admission: RE | Admit: 2011-10-26 | Discharge: 2011-10-26 | Disposition: A | Discharge status: Home / Self Care | Payer: Medicare Other | Source: Ambulatory Visit | Attending: Radiation Oncology | Admitting: Radiation Oncology

## 2011-10-26 DIAGNOSIS — Z510 Encounter for antineoplastic radiation therapy: Secondary | ICD-10-CM | POA: Diagnosis not present

## 2011-10-26 DIAGNOSIS — C50919 Malignant neoplasm of unspecified site of unspecified female breast: Secondary | ICD-10-CM | POA: Diagnosis not present

## 2011-10-26 DIAGNOSIS — Z79899 Other long term (current) drug therapy: Secondary | ICD-10-CM | POA: Diagnosis not present

## 2011-10-27 ENCOUNTER — Ambulatory Visit
Admission: RE | Admit: 2011-10-27 | Discharge: 2011-10-27 | Disposition: A | Discharge status: Home / Self Care | Payer: Medicare Other | Source: Ambulatory Visit | Attending: Radiation Oncology | Admitting: Radiation Oncology

## 2011-10-27 DIAGNOSIS — Z510 Encounter for antineoplastic radiation therapy: Secondary | ICD-10-CM | POA: Diagnosis not present

## 2011-10-27 DIAGNOSIS — C50919 Malignant neoplasm of unspecified site of unspecified female breast: Secondary | ICD-10-CM | POA: Diagnosis not present

## 2011-10-27 DIAGNOSIS — Z79899 Other long term (current) drug therapy: Secondary | ICD-10-CM | POA: Diagnosis not present

## 2011-10-27 LAB — ESTRADIOL, ULTRA SENS: Estradiol, Ultra Sensitive: 8 pg/mL

## 2011-10-28 ENCOUNTER — Encounter: Payer: Self-pay | Admitting: Radiation Oncology

## 2011-10-28 ENCOUNTER — Ambulatory Visit
Admission: RE | Admit: 2011-10-28 | Discharge: 2011-10-28 | Disposition: A | Discharge status: Home / Self Care | Payer: Medicare Other | Source: Ambulatory Visit | Attending: Radiation Oncology | Admitting: Radiation Oncology

## 2011-10-28 VITALS — BP 153/95 | HR 80 | Resp 18 | Wt 191.5 lb

## 2011-10-28 DIAGNOSIS — C50419 Malignant neoplasm of upper-outer quadrant of unspecified female breast: Secondary | ICD-10-CM

## 2011-10-28 DIAGNOSIS — C50919 Malignant neoplasm of unspecified site of unspecified female breast: Secondary | ICD-10-CM | POA: Diagnosis not present

## 2011-10-28 DIAGNOSIS — Z510 Encounter for antineoplastic radiation therapy: Secondary | ICD-10-CM | POA: Diagnosis not present

## 2011-10-28 DIAGNOSIS — Z79899 Other long term (current) drug therapy: Secondary | ICD-10-CM | POA: Diagnosis not present

## 2011-10-28 NOTE — Addendum Note (Signed)
Encounter addended by: Agnes Lawrence, RN on: 10/28/2011  3:55 PM<BR>     Documentation filed: Notes Section

## 2011-10-28 NOTE — Progress Notes (Signed)
Patient presents to the clinic today unaccompanied for an under treat visit with Dr. Mitzi Hansen. Patient is alert and oriented to person, place, and time. No distress noted. Steady gait noted. Tearful affect noted. Patient denies pain at this time. Patient expressed desire to take Friday and Monday off since that therapist, Faith, that she feels most comfortable with will be off. Explained to the patient that was not recommended but, that Dr. Mitzi Hansen has the final say. Hyperpigmentation without desquamation to left breast, under left breast, left supraclavicular area, left posterior neck, and left upper back. Patient reports using Biafine cream as directed. Patient reports her energy level is normal and unchanged since the beginning. Reported all findings to Dr. Mitzi Hansen.

## 2011-10-28 NOTE — Progress Notes (Signed)
Provided patient with hydrogel pad and educated on use as ordered by Dr. Mitzi Hansen. Patient verbalized understanding.

## 2011-10-28 NOTE — Progress Notes (Signed)
Department of Radiation Oncology  Phone:  [phone] Fax:        [phone]  Weekly Treatment Note    Name: [name] Date: 10/28/2011 MRN: [id number] DOB: [birth year]   Current dose: 3.2 Gy  Current fraction: 24   MEDICATIONS: Current Outpatient Prescriptions  Medication Sig Dispense Refill  . carvedilol (COREG) 25 MG tablet Take 25 mg by mouth 2 (two) times daily with a meal.        . emollient (BIAFINE) cream Apply topically as needed.      . furosemide (LASIX) 40 MG tablet Take 80-120 mg by mouth daily. 120 mg in the am and 80 mg in the pm.      . gabapentin (NEURONTIN) 100 MG capsule Take 3 capsules (300 mg total) by mouth 3 (three) times daily.  60 capsule  1  . Ibuprofen-Diphenhydramine HCl (ADVIL PM) 200-25 MG CAPS Take 3 capsules by mouth at bedtime.        [name] levothyroxine (SYNTHROID, LEVOTHROID) 100 MCG tablet Take 100 mcg by mouth daily.        [name] lidocaine-prilocaine (EMLA) cream Apply 1 application topically as needed. For pain      . lisinopril (PRINIVIL,ZESTRIL) 20 MG tablet Take 20 mg by mouth daily.        [name] LORazepam (ATIVAN) 0.5 MG tablet TAKE ONE TABLET BY MOUTH TWICE DAILY AS NEEDED FOR NAUSEA AND ANXIETY  60 tablet  0  . magnesium oxide (MAG-OX) 400 MG tablet Take 800 mg by mouth daily.        . Melatonin 10 MG CAPS Take 40 mg by mouth at bedtime.      . nitroGLYCERIN (NITROSTAT) 0.4 MG SL tablet Place 0.4 mg under the tongue every 5 (five) minutes as needed. As needed for chest pain.      . non-metallic deodorant Thornton Papas) MISC Apply 1 application topically daily as needed.      [name] omeprazole (PRILOSEC) 20 MG capsule Take 20 mg by mouth 2 (two) times daily.       [name] OVER THE COUNTER MEDICATION Take 3 capsules by mouth every morning. Green Tea Extract OTC.       [name] oxyCODONE-acetaminophen (PERCOCET/ROXICET) 5-325 MG per tablet Take 1 tablet by mouth 2 (two) times daily as needed. As needed for pain.  60 tablet  0  . potassium chloride SA (K-DUR,KLOR-CON) 20  MEQ tablet Take 20 mEq by mouth 2 (two) times daily.      . sertraline (ZOLOFT) 100 MG tablet Take 100 mg by mouth daily.        . sertraline (ZOLOFT) 50 MG tablet Take 1 tablet (50 mg total) by mouth daily. Patient may take and additional 50 mg if she feels she is having a bad day  30 tablet  3  . traZODone (DESYREL) 50 MG tablet Take 50 mg by mouth at bedtime.      . traZODone (DESYREL) 50 MG tablet TAKE ONE TABLET BY MOUTH EVERY DAY AT BEDTIME  30 tablet  0  . zolpidem (AMBIEN) 10 MG tablet Take 10 mg by mouth at bedtime.       . Wound Cleansers (RADIAPLEX EX) Apply topically.       No current facility-administered medications for this encounter.   Facility-Administered Medications Ordered in Other Encounters  Medication Dose Route Frequency Provider Last Rate Last Dose  . sodium chloride 0.9 % injection 10 mL  10 mL Intracatheter PRN Lowella Dell, MD  ALLERGIES: Aspirin; Docetaxel; Morphine and related; and Penicillins   LABORATORY DATA:  Lab Results  Component Value Date   WBC 2.8* 10/20/2011   HGB 11.2* 10/20/2011   HCT 33.7* 10/20/2011   MCV 85.3 10/20/2011   PLT 196 10/20/2011   Lab Results  Component Value Date   NA 135 10/20/2011   K 4.2 10/20/2011   CL 100 10/20/2011   CO2 24 10/20/2011   Lab Results  Component Value Date   ALT 20 10/20/2011   AST 22 10/20/2011   ALKPHOS 76 10/20/2011   BILITOT 0.3 10/20/2011     NARRATIVE: [name] was seen today for weekly treatment management. The chart was checked and the patient's films were reviewed. The patient is somewhat tearful today. She is having increased skin irritation and this is weighing on her. There is also been a change in therapists the next couple of days and this is also affecting her it appears from her discussion with nursing. Patient is currently applying a wet towel to the supraclavicular region which is the most irritated area.  PHYSICAL EXAMINATION: weight is 191 lb 8 oz (86.864 kg). Her blood  pressure is 153/95 and her pulse is 80. Her respiration is 18.      diffuse hyperpigmentation is present. Increased hyperpigmentation in the supraclavicular region and the inframammary region. No moist desquamation. No significant dry desquamation in the supraclavicular region although this is close to occurring.  ASSESSMENT: The patient is doing satisfactorily with treatment. She is having some increased skin irritation and we discussed the timeframe of this.  PLAN: The patient wishes to take a long weekend coming up this weekend. She is quite tearful today and I believe that this will be necessary from talking to her given her condition. This may allow a little bit more healing of the affected areas. We will give her hydrogel pads which I believe will help soothe the affected areas. She will return next Tuesday and this will still allow [place] to finish the following week.

## 2011-10-29 ENCOUNTER — Encounter: Payer: Self-pay | Admitting: Radiation Oncology

## 2011-10-29 ENCOUNTER — Ambulatory Visit: Payer: Medicare Other

## 2011-10-29 DIAGNOSIS — Z510 Encounter for antineoplastic radiation therapy: Secondary | ICD-10-CM | POA: Diagnosis not present

## 2011-10-29 DIAGNOSIS — C50419 Malignant neoplasm of upper-outer quadrant of unspecified female breast: Secondary | ICD-10-CM | POA: Diagnosis not present

## 2011-10-29 DIAGNOSIS — Z79899 Other long term (current) drug therapy: Secondary | ICD-10-CM | POA: Diagnosis not present

## 2011-10-29 DIAGNOSIS — C50919 Malignant neoplasm of unspecified site of unspecified female breast: Secondary | ICD-10-CM | POA: Diagnosis not present

## 2011-11-01 ENCOUNTER — Ambulatory Visit: Payer: Medicare Other

## 2011-11-02 ENCOUNTER — Ambulatory Visit
Admission: RE | Admit: 2011-11-02 | Discharge: 2011-11-02 | Disposition: A | Discharge status: Home / Self Care | Payer: Medicare Other | Source: Ambulatory Visit | Attending: Radiation Oncology | Admitting: Radiation Oncology

## 2011-11-02 ENCOUNTER — Ambulatory Visit: Admission: RE | Admit: 2011-11-02 | Payer: Medicare Other | Source: Ambulatory Visit

## 2011-11-02 DIAGNOSIS — C50919 Malignant neoplasm of unspecified site of unspecified female breast: Secondary | ICD-10-CM | POA: Diagnosis not present

## 2011-11-02 DIAGNOSIS — Z510 Encounter for antineoplastic radiation therapy: Secondary | ICD-10-CM | POA: Diagnosis not present

## 2011-11-02 DIAGNOSIS — Z79899 Other long term (current) drug therapy: Secondary | ICD-10-CM | POA: Diagnosis not present

## 2011-11-02 MED ORDER — HYDROCODONE-ACETAMINOPHEN 5-500 MG PO CAPS: 1.0000 | ORAL_CAPSULE | Freq: Four times a day (QID) | ORAL | Status: AC | PRN

## 2011-11-02 MED ORDER — SILVER SULFADIAZINE 1 % EX CREA
TOPICAL_CREAM | Freq: Three times a day (TID) | CUTANEOUS | Status: DC
  Administered 2011-11-02: 2 via TOPICAL

## 2011-11-02 NOTE — Addendum Note (Signed)
Encounter addended by: Delynn Flavin, RN on: 11/02/2011  6:48 PM<BR>     Documentation filed: Notes Section

## 2011-11-02 NOTE — Addendum Note (Signed)
Encounter addended by: Delynn Flavin, RN on: 11/02/2011  4:27 PM<BR>     Documentation filed: Notes Section

## 2011-11-02 NOTE — Progress Notes (Signed)
   Department of Radiation Oncology  Phone:  [phone] Fax:        [phone]   Weekly Management Note Current Dose: 43.2  Gy  Projected Dose: 60.4  Gy   Narrative:  The patient presents prior to her scheduled 25th treatment. She was placed on a break late last week by Dr. Mitzi Hansen in light of her skin reaction. She continues to have a lot of pain  in the upper chest near her clavicle area.  Physical Findings: Weight:  . The patient has moist desquamation along the supraclavicular and upper chest area. This extends over approximately 7-8 cm. The remainder of the treatment area shows erythema and some hyperpigmentation changes but no moist desquamation  Impression:  The patient has developed moist desquamation. She is having a lot of pain now related to this issue. I did refill the patient's hydrocodone prescription today.   Plan:  Patient wishes to be on a break until she feels better. This is not unreasonable given her skin reaction.  Patient will be started on Silvadene for the area of moist desquamation. She is to return prior to her scheduled treatment on August 19 for skin check.  -----------------------------------  Billie Lade, PhD, MD

## 2011-11-02 NOTE — Progress Notes (Addendum)
[name] returned today from treatment break  for review of skin in the left supraclavicular region.  Reddened, moist desquamation evident without any drainage.  She reports continuous burning of this area with a sensation that her skin is" crawling."  She arrived with a moist paper towel over the area.  She reports that the relief from hydrogel pads dissipates quickly because of the "heat" in this area and has placed the pads in the freezer before applying to the desquamated area to obtain better relief in addition to applying mupirocin ointment with minimal and  milk on this area to relieve the burn. Evaluated by Dr. Roselind Messier who ordered Silvadene cream, which was applied prior to disposition to home.  The desquamated area was covered op-site per [name] request and extra pads given.  Instructed to make sure she cleanses the area, completely, prior to each application of Silvadene and to not double dip in the jar.  Given 2 50gm jars.  Dr. Roselind Messier decided that [name] will not re-start treatment today but will return on Monday 11/08/11 for an assessment prior to resuming treatment.

## 2011-11-03 ENCOUNTER — Ambulatory Visit: Payer: Medicare Other

## 2011-11-03 NOTE — Progress Notes (Signed)
Unable to scan Silvadene to charge on 11/02/11 due to inability to apture bar code when scanning.  Able to scan on 11/03/11 at 9:40am.

## 2011-11-03 NOTE — Addendum Note (Signed)
Encounter addended by: Delynn Flavin, RN on: 11/03/2011  9:43 AM<BR>     Documentation filed: Inpatient Notes, Inpatient [facility]

## 2011-11-04 ENCOUNTER — Ambulatory Visit: Payer: Medicare Other

## 2011-11-05 ENCOUNTER — Ambulatory Visit
Admission: RE | Admit: 2011-11-05 | Discharge: 2011-11-05 | Disposition: A | Discharge status: Home / Self Care | Payer: Medicare Other | Source: Ambulatory Visit | Attending: Radiation Oncology | Admitting: Radiation Oncology

## 2011-11-05 ENCOUNTER — Telehealth: Payer: Self-pay | Admitting: *Deleted

## 2011-11-05 ENCOUNTER — Ambulatory Visit: Payer: Medicare Other

## 2011-11-05 ENCOUNTER — Encounter: Payer: Self-pay | Admitting: Radiation Oncology

## 2011-11-05 DIAGNOSIS — C50919 Malignant neoplasm of unspecified site of unspecified female breast: Secondary | ICD-10-CM | POA: Diagnosis not present

## 2011-11-05 DIAGNOSIS — Z510 Encounter for antineoplastic radiation therapy: Secondary | ICD-10-CM | POA: Diagnosis not present

## 2011-11-05 DIAGNOSIS — Z79899 Other long term (current) drug therapy: Secondary | ICD-10-CM | POA: Diagnosis not present

## 2011-11-05 NOTE — Progress Notes (Signed)
  Radiation Oncology         [phone] ________________________________  Name: [name] MRN: [id number]  Date: 11/05/2011  DOB: [birth year]  Weekly Radiation Therapy Management  Current Dose: 43.2 Gy     Planned Dose:  60.4 Gy  Narrative . . . . . . . . 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 She is currently on a break from radiation because moist desquamation in the supraclavicular region. She felt that the reaction was worsening and asked to be seen in the office today..                                 Set-up films were reviewed.                                 The chart was checked. Physical Findings. . . the patient appears to be in modest discomfort.  She has a patch of moist desquamation measuring approximately 10 cm occupying most of the medial supraclavicular portal. There is no evidence of superinfection. Weight essentially stable.  No significant changes. Impression . . . . . . . The patient is tolerating radiation to the breast relatively well. However, she has somewhat significant moist desquamation in the supraclavicular region. Plan . . . . . . . . . . . . The patient will continue her treatment break. I have advised her not to apply Silvadene within the large region of moist desquamation and rather simply apply a hydrogel dressing to serve as a moisture barrier which is sterile. I've asked her to return to the office on Monday, August 19 to see Dr. Mitzi Hansen for skin check. She may be able to resume radiation to the breast and continues a break of radiation to her supraclavicular region the patient understands that she may resume radiation to the breast August 20 and potentially complete radiation to the supraclavicular region now or continue her break from supraclavicular radiotherapy.  ________________________________  Artist Pais. Kathrynn Running, M.D.

## 2011-11-05 NOTE — Progress Notes (Addendum)
Ms Bost in today with an increased area of moist desquamation in the left supraclavicular. Area reddened with yellowish drainage mixed with Silvadene.  Grades pain in this area as a 9 on a scale of 0-10.   Seen on Tuesday by Dr. Basilio Cairo and switched from hydrogel pads to Silvadene BID.   Seen by Dr. Kathrynn Running today who has discontinued the silvadene creme and instructed Ms Abdou to only use Hydrogel pads until seen by Dr. Mitzi Hansen on Monday 11/08/11.  Will receive  Hydrocodone refill from Dr. Kathrynn Running today.  Area cleansed with Sterile NS and Hydrogel applied.  Given 2 boxes of Hydrogels and charged out.  Given Sterile NS and 4x4 sterile and instructed patient and daughter in how to cleanse her desquamated area.  Given kerlix gauze and hypafix tape to secure pad at bedtime with instructions to avoid placing any tape in the  treatment field.  Ms Ernster will return on Monday to be re-assesed by Dr. Mitzi Hansen.

## 2011-11-05 NOTE — Telephone Encounter (Signed)
md out of the office on 12-09-2011 moved patient appointment to 12-13-2011 

## 2011-11-05 NOTE — Addendum Note (Signed)
Encounter addended by: Delynn Flavin, RN on: 11/05/2011  8:52 PM<BR>     Documentation filed: Notes Section

## 2011-11-08 ENCOUNTER — Ambulatory Visit
Admission: RE | Admit: 2011-11-08 | Discharge: 2011-11-08 | Disposition: A | Discharge status: Home / Self Care | Payer: Medicare Other | Source: Ambulatory Visit | Attending: Radiation Oncology | Admitting: Radiation Oncology

## 2011-11-08 ENCOUNTER — Ambulatory Visit: Payer: Medicare Other

## 2011-11-08 ENCOUNTER — Encounter: Payer: Self-pay | Admitting: Radiation Oncology

## 2011-11-08 VITALS — BP 110/70 | HR 76 | Temp 99.9°F | Resp 20

## 2011-11-08 DIAGNOSIS — C50419 Malignant neoplasm of upper-outer quadrant of unspecified female breast: Secondary | ICD-10-CM

## 2011-11-08 NOTE — Progress Notes (Signed)
Department of Radiation Oncology  Phone:  [phone] Fax:        [phone]  Weekly Treatment Note    Name: [name] Date: 11/08/2011 MRN: [id number] DOB: 09/20/1960   MEDICATIONS: Current Outpatient Prescriptions  Medication Sig Dispense Refill  . carvedilol (COREG) 25 MG tablet Take 25 mg by mouth 2 (two) times daily with a meal.        . emollient (BIAFINE) cream Apply topically as needed.      . furosemide (LASIX) 40 MG tablet Take 80-120 mg by mouth daily. 120 mg in the am and 80 mg in the pm.      . gabapentin (NEURONTIN) 100 MG capsule Take 3 capsules (300 mg total) by mouth 3 (three) times daily.  60 capsule  1  . hydrocodone-acetaminophen (LORCET-HD) 5-500 MG per capsule Take 1 capsule by mouth every 6 (six) hours as needed for pain.  60 capsule  0  . Ibuprofen-Diphenhydramine HCl (ADVIL PM) 200-25 MG CAPS Take 3 capsules by mouth at bedtime.        [name] levothyroxine (SYNTHROID, LEVOTHROID) 100 MCG tablet Take 100 mcg by mouth daily.        [name] lidocaine-prilocaine (EMLA) cream Apply 1 application topically as needed. For pain      . lisinopril (PRINIVIL,ZESTRIL) 20 MG tablet Take 20 mg by mouth daily.        [name] LORazepam (ATIVAN) 0.5 MG tablet TAKE ONE TABLET BY MOUTH TWICE DAILY AS NEEDED FOR NAUSEA AND ANXIETY  60 tablet  0  . magnesium oxide (MAG-OX) 400 MG tablet Take 800 mg by mouth daily.        . Melatonin 10 MG CAPS Take 40 mg by mouth at bedtime.      . nitroGLYCERIN (NITROSTAT) 0.4 MG SL tablet Place 0.4 mg under the tongue every 5 (five) minutes as needed. As needed for chest pain.      . non-metallic deodorant Thornton Papas) MISC Apply 1 application topically daily as needed.      [name] omeprazole (PRILOSEC) 20 MG capsule Take 20 mg by mouth 2 (two) times daily.       [name] OVER THE COUNTER MEDICATION Take 3 capsules by mouth every morning. Green Tea Extract OTC.       [name] potassium chloride SA (K-DUR,KLOR-CON) 20 MEQ tablet Take 20 mEq by mouth 2 (two) times daily.      .  sertraline (ZOLOFT) 100 MG tablet Take 100 mg by mouth daily.        . sertraline (ZOLOFT) 50 MG tablet Take 1 tablet (50 mg total) by mouth daily. Patient may take and additional 50 mg if she feels she is having a bad day  30 tablet  3  . traZODone (DESYREL) 50 MG tablet Take 50 mg by mouth at bedtime.      . traZODone (DESYREL) 50 MG tablet TAKE ONE TABLET BY MOUTH EVERY DAY AT BEDTIME  30 tablet  0  . Wound Cleansers (RADIAPLEX EX) Apply topically.      [name] zolpidem (AMBIEN) 10 MG tablet Take 10 mg by mouth at bedtime.        No current facility-administered medications for this encounter.   Facility-Administered Medications Ordered in Other Encounters  Medication Dose Route Frequency Provider Last Rate Last Dose  . sodium chloride 0.9 % injection 10 mL  10 mL Intracatheter PRN Lowella Dell, MD         ALLERGIES: Aspirin; Docetaxel; Morphine and related; and  Penicillins   LABORATORY DATA:  Lab Results  Component Value Date   WBC 2.8* 10/20/2011   HGB 11.2* 10/20/2011   HCT 33.7* 10/20/2011   MCV 85.3 10/20/2011   PLT 196 10/20/2011   Lab Results  Component Value Date   NA 135 10/20/2011   K 4.2 10/20/2011   CL 100 10/20/2011   CO2 24 10/20/2011   Lab Results  Component Value Date   ALT 20 10/20/2011   AST 22 10/20/2011   ALKPHOS 76 10/20/2011   BILITOT 0.3 10/20/2011     NARRATIVE: [name] was seen today for a skin check. She has been on. The patient had significant irritation/desquamation of the left supraclavicular region. Radiation induced changes elsewhere but the largest reaction was in the left supraclavicular region. The patient has been using Silvadene cream as well as hydrogel pads more recently. Nursing indicates today that this area has improved although it remains irritated.  PHYSICAL EXAMINATION: oral temperature is 99.9 F (37.7 C). Her blood pressure is 110/70 and her pulse is 76. Her respiration is 20.      diffuse hyperpigmentation throughout the treatment  area. There is some dryness without any skin breakdown except in the left supraclavicular region. This is an area which appears to be healing from moist desquamation. There is no moistness currently.  ASSESSMENT: The patient has had a significant skin reaction to treatment and she has been placed on break. This is been managed with several measures and there is no sign of infection at this point. The skin is healing.  PLAN: The patient is not eager to resume treatment. I did discuss that breaks in treatment can decrease the effectiveness of the overall course of radiation. We discussed different options and decided to proceed with a boost treatment to the tumor cavity on Wednesday coming up in 2 days. We will do this for one week which has been planned and then we will discuss at that point resuming her original treatment to the left breast and left supraclavicular region. She still lacks 4 of these fractions. We'll make a final decision regarding this after reevaluating her skin.

## 2011-11-08 NOTE — Progress Notes (Signed)
Patient arrived for skin cahck, only using telfa dressing on left supraclavicular area, dry desquamation noe, pink, using hydrogel pads no silvadene cream, cool moist cloth applied to patient neck, pain better after taking pain med. Fair appetite, fatigue, tongue dry, yellow"stil has mmw, feels thrush coming back on 4:51 PM

## 2011-11-09 ENCOUNTER — Telehealth: Payer: Self-pay | Admitting: *Deleted

## 2011-11-09 ENCOUNTER — Ambulatory Visit: Payer: Medicare Other

## 2011-11-09 NOTE — Telephone Encounter (Signed)
Ms. Kittle Daughter called to confirm her mothers' treatment for tomorrow at 12;20pm.  She was informed that it will be a 30 minute visit because heer mom will have planning for her boost in addition to treatment.   She stated that Ms. Hockley will be here tomorrow.

## 2011-11-10 ENCOUNTER — Ambulatory Visit: Payer: Medicare Other

## 2011-11-10 ENCOUNTER — Ambulatory Visit
Admission: RE | Admit: 2011-11-10 | Discharge: 2011-11-10 | Disposition: A | Discharge status: Home / Self Care | Payer: Medicare Other | Source: Ambulatory Visit | Attending: Radiation Oncology | Admitting: Radiation Oncology

## 2011-11-10 DIAGNOSIS — Z79899 Other long term (current) drug therapy: Secondary | ICD-10-CM | POA: Diagnosis not present

## 2011-11-10 DIAGNOSIS — Z510 Encounter for antineoplastic radiation therapy: Secondary | ICD-10-CM | POA: Diagnosis not present

## 2011-11-10 DIAGNOSIS — C50919 Malignant neoplasm of unspecified site of unspecified female breast: Secondary | ICD-10-CM | POA: Diagnosis not present

## 2011-11-10 DIAGNOSIS — C50419 Malignant neoplasm of upper-outer quadrant of unspecified female breast: Secondary | ICD-10-CM | POA: Diagnosis not present

## 2011-11-11 ENCOUNTER — Ambulatory Visit: Payer: Medicare Other

## 2011-11-11 ENCOUNTER — Ambulatory Visit
Admission: RE | Admit: 2011-11-11 | Discharge: 2011-11-11 | Disposition: A | Discharge status: Home / Self Care | Payer: Medicare Other | Source: Ambulatory Visit | Attending: Radiation Oncology | Admitting: Radiation Oncology

## 2011-11-11 ENCOUNTER — Encounter: Payer: Self-pay | Admitting: Radiation Oncology

## 2011-11-11 VITALS — BP 125/82 | HR 76 | Temp 98.4°F | Resp 20 | Wt 188.7 lb

## 2011-11-11 DIAGNOSIS — C50919 Malignant neoplasm of unspecified site of unspecified female breast: Secondary | ICD-10-CM | POA: Diagnosis not present

## 2011-11-11 DIAGNOSIS — Z79899 Other long term (current) drug therapy: Secondary | ICD-10-CM | POA: Diagnosis not present

## 2011-11-11 DIAGNOSIS — C50419 Malignant neoplasm of upper-outer quadrant of unspecified female breast: Secondary | ICD-10-CM

## 2011-11-11 DIAGNOSIS — Z510 Encounter for antineoplastic radiation therapy: Secondary | ICD-10-CM | POA: Diagnosis not present

## 2011-11-11 MED ORDER — RADIAPLEXRX EX GEL
Freq: Once | CUTANEOUS | Status: AC
  Administered 2011-11-11: 13:00:00 via TOPICAL

## 2011-11-11 NOTE — Progress Notes (Signed)
Patient,alert,oriented x3, steady gait, on 2/5 boost  Left breat, hyperpigmentation on breast, undrr axilla, under imframmary breast skin thin,skin intact, on back of left shoulder dry skin, , on left supraclivcular , skin is healing,  dry  And patient c/o it being tight, not using any creams on that area, moist cloth on clavicular area per patient request, doing better stated patient,  12:55 PM

## 2011-11-11 NOTE — Progress Notes (Signed)
Department of Radiation Oncology  Phone:  [phone] Fax:        [phone]  Weekly Treatment Note    Name: [name] Date: 11/11/2011 MRN: [id number] DOB: March 25, 1960   Current dose: 47.2 Gy  Current fraction: 26   MEDICATIONS: Current Outpatient Prescriptions  Medication Sig Dispense Refill  . carvedilol (COREG) 25 MG tablet Take 25 mg by mouth 2 (two) times daily with a meal.        . furosemide (LASIX) 40 MG tablet Take 80-120 mg by mouth daily. 120 mg in the am and 80 mg in the pm.      . gabapentin (NEURONTIN) 100 MG capsule Take 3 capsules (300 mg total) by mouth 3 (three) times daily.  60 capsule  1  . hyaluronate sodium (RADIAPLEXRX) GEL Apply 1 application topically 2 (two) times daily.      . hydrocodone-acetaminophen (LORCET-HD) 5-500 MG per capsule Take 1 capsule by mouth every 6 (six) hours as needed for pain.  60 capsule  0  . Ibuprofen-Diphenhydramine HCl (ADVIL PM) 200-25 MG CAPS Take 3 capsules by mouth at bedtime.        [name] levothyroxine (SYNTHROID, LEVOTHROID) 100 MCG tablet Take 100 mcg by mouth daily.        [name] lidocaine-prilocaine (EMLA) cream Apply 1 application topically as needed. For pain      . lisinopril (PRINIVIL,ZESTRIL) 20 MG tablet Take 20 mg by mouth daily.        [name] LORazepam (ATIVAN) 0.5 MG tablet TAKE ONE TABLET BY MOUTH TWICE DAILY AS NEEDED FOR NAUSEA AND ANXIETY  60 tablet  0  . magnesium oxide (MAG-OX) 400 MG tablet Take 800 mg by mouth daily.        . Melatonin 10 MG CAPS Take 40 mg by mouth at bedtime.      . nitroGLYCERIN (NITROSTAT) 0.4 MG SL tablet Place 0.4 mg under the tongue every 5 (five) minutes as needed. As needed for chest pain.      . non-metallic deodorant Thornton Papas) MISC Apply 1 application topically daily as needed.      [name] omeprazole (PRILOSEC) 20 MG capsule Take 20 mg by mouth 2 (two) times daily.       [name] OVER THE COUNTER MEDICATION Take 3 capsules by mouth every morning. Green Tea Extract OTC.       [name] potassium chloride  SA (K-DUR,KLOR-CON) 20 MEQ tablet Take 20 mEq by mouth 2 (two) times daily.      . sertraline (ZOLOFT) 100 MG tablet Take 100 mg by mouth daily.        . sertraline (ZOLOFT) 50 MG tablet Take 1 tablet (50 mg total) by mouth daily. Patient may take and additional 50 mg if she feels she is having a bad day  30 tablet  3  . traZODone (DESYREL) 50 MG tablet Take 50 mg by mouth at bedtime.      . traZODone (DESYREL) 50 MG tablet TAKE ONE TABLET BY MOUTH EVERY DAY AT BEDTIME  30 tablet  0  . Wound Cleansers (RADIAPLEX EX) Apply topically.      [name] zolpidem (AMBIEN) 10 MG tablet Take 10 mg by mouth at bedtime.       [name] emollient (BIAFINE) cream Apply topically as needed.       Current Facility-Administered Medications  Medication Dose Route Frequency Provider Last Rate Last Dose  . hyaluronate sodium (RADIAPLEXRX) gel   Topical Once Jonna Coup, MD  Facility-Administered Medications Ordered in Other Encounters  Medication Dose Route Frequency Provider Last Rate Last Dose  . sodium chloride 0.9 % injection 10 mL  10 mL Intracatheter PRN Lowella Dell, MD         ALLERGIES: Aspirin; Docetaxel; Morphine and related; and Penicillins   LABORATORY DATA:  Lab Results  Component Value Date   WBC 2.8* 10/20/2011   HGB 11.2* 10/20/2011   HCT 33.7* 10/20/2011   MCV 85.3 10/20/2011   PLT 196 10/20/2011   Lab Results  Component Value Date   NA 135 10/20/2011   K 4.2 10/20/2011   CL 100 10/20/2011   CO2 24 10/20/2011   Lab Results  Component Value Date   ALT 20 10/20/2011   AST 22 10/20/2011   ALKPHOS 76 10/20/2011   BILITOT 0.3 10/20/2011     NARRATIVE: [name] was seen today for weekly treatment management. The chart was checked and the patient's films were reviewed. The patient's skin has continued to heal over the last couple of days. No new complaints with some ongoing tightness of skin in the neck.   PHYSICAL EXAMINATION: weight is 188 lb 11.2 oz (85.594 kg). Her oral temperature  is 98.4 F (36.9 C). Her blood pressure is 125/82 and her pulse is 76. Her respiration is 20.      Some dryness of skin and erythema in the supraclavicular region. No moist desquamation or signs of infection. Diffuse hyperpigmentation present elsewhere within the treatment area.  ASSESSMENT: The patient is doing satisfactorily with treatment.  PLAN: We will continue with the patient's radiation treatment as planned. We discussed using skin care and she did like Radioplex cream. We will therefore give her some more of this. I will check her skin prior to resuming her original plan next Wednesday.

## 2011-11-11 NOTE — Progress Notes (Signed)
  Radiation Oncology         [phone]) [phone] ________________________________  Name: [name] MRN: [id number]  Date: 11/10/2011  DOB: 1960/12/24  Simulation Verification Note   NARRATIVE: The patient was brought to the treatment unit and placed in the planned treatment position for her boost treatment. The patient will receive 5 fractions in this manner which will target the seroma cavity. The clinical setup was verified. Then port films were obtained and uploaded to the radiation oncology medical record software.  The treatment beams were carefully compared against the planned radiation fields. The position, location, and shape of the radiation fields was reviewed. The targeted volume of tissue appears to be appropriately covered by the radiation beams. Based on my personal review, I approved the simulation verification. The patient's treatment will proceed as planned.  ________________________________   Radene Gunning, MD, PhD

## 2011-11-12 ENCOUNTER — Ambulatory Visit
Admission: RE | Admit: 2011-11-12 | Discharge: 2011-11-12 | Disposition: A | Discharge status: Home / Self Care | Payer: Medicare Other | Source: Ambulatory Visit | Attending: Radiation Oncology | Admitting: Radiation Oncology

## 2011-11-12 ENCOUNTER — Ambulatory Visit: Payer: Medicare Other

## 2011-11-12 DIAGNOSIS — Z79899 Other long term (current) drug therapy: Secondary | ICD-10-CM | POA: Diagnosis not present

## 2011-11-12 DIAGNOSIS — Z510 Encounter for antineoplastic radiation therapy: Secondary | ICD-10-CM | POA: Diagnosis not present

## 2011-11-12 DIAGNOSIS — C50919 Malignant neoplasm of unspecified site of unspecified female breast: Secondary | ICD-10-CM | POA: Diagnosis not present

## 2011-11-14 ENCOUNTER — Other Ambulatory Visit: Payer: Self-pay | Admitting: Oncology

## 2011-11-15 ENCOUNTER — Encounter: Payer: Self-pay | Admitting: Radiation Oncology

## 2011-11-15 ENCOUNTER — Ambulatory Visit
Admission: RE | Admit: 2011-11-15 | Discharge: 2011-11-15 | Disposition: A | Discharge status: Home / Self Care | Payer: Medicare Other | Source: Ambulatory Visit | Attending: Radiation Oncology | Admitting: Radiation Oncology

## 2011-11-15 DIAGNOSIS — C50919 Malignant neoplasm of unspecified site of unspecified female breast: Secondary | ICD-10-CM | POA: Diagnosis not present

## 2011-11-15 DIAGNOSIS — Z510 Encounter for antineoplastic radiation therapy: Secondary | ICD-10-CM | POA: Diagnosis not present

## 2011-11-15 DIAGNOSIS — Z79899 Other long term (current) drug therapy: Secondary | ICD-10-CM | POA: Diagnosis not present

## 2011-11-16 ENCOUNTER — Ambulatory Visit
Admission: RE | Admit: 2011-11-16 | Discharge: 2011-11-16 | Disposition: A | Discharge status: Home / Self Care | Payer: Medicare Other | Source: Ambulatory Visit | Attending: Radiation Oncology | Admitting: Radiation Oncology

## 2011-11-16 DIAGNOSIS — C50919 Malignant neoplasm of unspecified site of unspecified female breast: Secondary | ICD-10-CM | POA: Diagnosis not present

## 2011-11-16 DIAGNOSIS — Z79899 Other long term (current) drug therapy: Secondary | ICD-10-CM | POA: Diagnosis not present

## 2011-11-16 DIAGNOSIS — Z510 Encounter for antineoplastic radiation therapy: Secondary | ICD-10-CM | POA: Diagnosis not present

## 2011-11-17 ENCOUNTER — Encounter: Payer: Self-pay | Admitting: Radiation Oncology

## 2011-11-17 ENCOUNTER — Ambulatory Visit
Admission: RE | Admit: 2011-11-17 | Discharge: 2011-11-17 | Disposition: A | Discharge status: Home / Self Care | Payer: Medicare Other | Source: Ambulatory Visit | Attending: Radiation Oncology | Admitting: Radiation Oncology

## 2011-11-17 VITALS — BP 136/95 | HR 71 | Temp 98.3°F | Resp 20 | Wt 189.2 lb

## 2011-11-17 DIAGNOSIS — Z510 Encounter for antineoplastic radiation therapy: Secondary | ICD-10-CM | POA: Diagnosis not present

## 2011-11-17 DIAGNOSIS — C50919 Malignant neoplasm of unspecified site of unspecified female breast: Secondary | ICD-10-CM | POA: Diagnosis not present

## 2011-11-17 DIAGNOSIS — Z79899 Other long term (current) drug therapy: Secondary | ICD-10-CM | POA: Diagnosis not present

## 2011-11-17 DIAGNOSIS — C50419 Malignant neoplasm of upper-outer quadrant of unspecified female breast: Secondary | ICD-10-CM

## 2011-11-17 NOTE — Progress Notes (Signed)
Patient completed boost 11/16/11, patient alert,oriented x3, left supraclavicular area dry, healing, only c/o itching states patient, left breat well healed, skin intact, just tanning,  .12:11 PM

## 2011-11-17 NOTE — Progress Notes (Signed)
Narrative The patient was seen today prior to her original fields including the supraclavicular region. The patient thus far has received 29 treatments and is due for her 30th treatment today. She is pleased with how her skin has continued to heal in the neck region. She states that her skin is doing well in the breast area also. She is anxious to complete her treatment and she does feel that she is ready to proceed with her treatment today.  On exam her skin does appear to be continuing to heal well with some hyperpigmentation present as well a surrounding hyperpigmentation in the treatment area. No ongoing significant desquamation and certainly no moist desquamation present.  We will proceed with her treatment as planned and she is scheduled to finish her treatment early next week.

## 2011-11-18 ENCOUNTER — Ambulatory Visit
Admission: RE | Admit: 2011-11-18 | Discharge: 2011-11-18 | Disposition: A | Discharge status: Home / Self Care | Payer: Medicare Other | Source: Ambulatory Visit | Attending: Radiation Oncology | Admitting: Radiation Oncology

## 2011-11-18 DIAGNOSIS — C50919 Malignant neoplasm of unspecified site of unspecified female breast: Secondary | ICD-10-CM | POA: Diagnosis not present

## 2011-11-18 DIAGNOSIS — C50419 Malignant neoplasm of upper-outer quadrant of unspecified female breast: Secondary | ICD-10-CM | POA: Diagnosis not present

## 2011-11-18 NOTE — Progress Notes (Signed)
Department of Radiation Oncology  Phone:  [phone] Fax:        [phone]  Weekly Treatment Note    Name: [name] Date: 11/18/2011 MRN: [id number] DOB: May 10, 1960   Current dose: 56.8 Gy  Current fraction: 31   MEDICATIONS: Current Outpatient Prescriptions  Medication Sig Dispense Refill  . carvedilol (COREG) 25 MG tablet Take 25 mg by mouth 2 (two) times daily with a meal.        . emollient (BIAFINE) cream Apply topically as needed.      . furosemide (LASIX) 40 MG tablet Take 80-120 mg by mouth daily. 120 mg in the am and 80 mg in the pm.      . gabapentin (NEURONTIN) 100 MG capsule Take 3 capsules (300 mg total) by mouth 3 (three) times daily.  60 capsule  1  . hyaluronate sodium (RADIAPLEXRX) GEL Apply 1 application topically 2 (two) times daily.      . Ibuprofen-Diphenhydramine HCl (ADVIL PM) 200-25 MG CAPS Take 3 capsules by mouth at bedtime.        [name] levothyroxine (SYNTHROID, LEVOTHROID) 100 MCG tablet Take 100 mcg by mouth daily.        [name] lidocaine-prilocaine (EMLA) cream Apply 1 application topically as needed. For pain      . lisinopril (PRINIVIL,ZESTRIL) 20 MG tablet Take 20 mg by mouth daily.        [name] LORazepam (ATIVAN) 0.5 MG tablet TAKE ONE TABLET BY MOUTH TWICE DAILY AS NEEDED FOR NAUSEA AND ANXIETY  60 tablet  0  . magnesium oxide (MAG-OX) 400 MG tablet Take 800 mg by mouth daily.        . Melatonin 10 MG CAPS Take 40 mg by mouth at bedtime.      . nitroGLYCERIN (NITROSTAT) 0.4 MG SL tablet Place 0.4 mg under the tongue every 5 (five) minutes as needed. As needed for chest pain.      . non-metallic deodorant Thornton Papas) MISC Apply 1 application topically daily as needed.      [name] omeprazole (PRILOSEC) 20 MG capsule Take 20 mg by mouth 2 (two) times daily.       [name] OVER THE COUNTER MEDICATION Take 3 capsules by mouth every morning. Green Tea Extract OTC.       [name] potassium chloride SA (K-DUR,KLOR-CON) 20 MEQ tablet Take 20 mEq by mouth 2 (two) times daily.        . sertraline (ZOLOFT) 100 MG tablet Take 100 mg by mouth daily.        . sertraline (ZOLOFT) 50 MG tablet Take 1 tablet (50 mg total) by mouth daily. Patient may take and additional 50 mg if she feels she is having a bad day  30 tablet  3  . traZODone (DESYREL) 50 MG tablet TAKE ONE TABLET BY MOUTH EVERY DAY AT BEDTIME  30 tablet  0  . Wound Cleansers (RADIAPLEX EX) Apply topically.      [name] zolpidem (AMBIEN) 10 MG tablet Take 10 mg by mouth at bedtime.        No current facility-administered medications for this encounter.   Facility-Administered Medications Ordered in Other Encounters  Medication Dose Route Frequency Provider Last Rate Last Dose  . sodium chloride 0.9 % injection 10 mL  10 mL Intracatheter PRN Lowella Dell, MD         ALLERGIES: Aspirin; Docetaxel; Morphine and related; and Penicillins   LABORATORY DATA:  Lab Results  Component Value Date   WBC  2.8* 10/20/2011   HGB 11.2* 10/20/2011   HCT 33.7* 10/20/2011   MCV 85.3 10/20/2011   PLT 196 10/20/2011   Lab Results  Component Value Date   NA 135 10/20/2011   K 4.2 10/20/2011   CL 100 10/20/2011   CO2 24 10/20/2011   Lab Results  Component Value Date   ALT 20 10/20/2011   AST 22 10/20/2011   ALKPHOS 76 10/20/2011   BILITOT 0.3 10/20/2011     NARRATIVE: [name] was seen today for weekly treatment management. The chart was checked and the patient's films were reviewed. The patient is seen today again after restarting her original fields including the supraclavicular region. She states that her skin has done fine since restarting this yesterday. No new complaints. She does continue to note that using a wet back on the neck does help alleviate the pain.  PHYSICAL EXAMINATION: vitals were not taken for this visit.     skin in the supraclavicular region appears to continue to heal. No desquamation currently.  ASSESSMENT: The patient is doing satisfactorily with treatment.  PLAN: We will continue with the patient's  radiation treatment as planned.

## 2011-11-19 ENCOUNTER — Ambulatory Visit: Admission: RE | Admit: 2011-11-19 | Payer: Medicare Other | Source: Ambulatory Visit

## 2011-11-23 ENCOUNTER — Ambulatory Visit
Admission: RE | Admit: 2011-11-23 | Discharge: 2011-11-23 | Disposition: A | Discharge status: Home / Self Care | Payer: Medicare Other | Source: Ambulatory Visit | Attending: Radiation Oncology | Admitting: Radiation Oncology

## 2011-11-23 DIAGNOSIS — C50919 Malignant neoplasm of unspecified site of unspecified female breast: Secondary | ICD-10-CM | POA: Diagnosis not present

## 2011-11-24 ENCOUNTER — Encounter: Payer: Self-pay | Admitting: Radiation Oncology

## 2011-11-24 ENCOUNTER — Ambulatory Visit
Admission: RE | Admit: 2011-11-24 | Discharge: 2011-11-24 | Disposition: A | Discharge status: Home / Self Care | Payer: Medicare Other | Source: Ambulatory Visit | Attending: Radiation Oncology | Admitting: Radiation Oncology

## 2011-11-24 ENCOUNTER — Ambulatory Visit: Payer: Medicare Other

## 2011-11-24 VITALS — BP 121/85 | HR 82 | Resp 18 | Wt 189.4 lb

## 2011-11-24 DIAGNOSIS — C50919 Malignant neoplasm of unspecified site of unspecified female breast: Secondary | ICD-10-CM | POA: Diagnosis not present

## 2011-11-24 DIAGNOSIS — C50419 Malignant neoplasm of upper-outer quadrant of unspecified female breast: Secondary | ICD-10-CM

## 2011-11-24 MED ORDER — RADIAPLEXRX EX GEL
Freq: Once | CUTANEOUS | Status: AC
  Administered 2011-11-24: 13:00:00 via TOPICAL

## 2011-11-24 NOTE — Progress Notes (Signed)
Department of Radiation Oncology  Phone:  [phone] Fax:        [phone]  Weekly Treatment Note    Name: [name] Date: 11/24/2011 MRN: [id number] DOB: 02/27/1961   Current dose: 60.4 Gy  Current fraction:33   MEDICATIONS: Current Outpatient Prescriptions  Medication Sig Dispense Refill  . carvedilol (COREG) 25 MG tablet Take 25 mg by mouth 2 (two) times daily with a meal.        . furosemide (LASIX) 40 MG tablet Take 80-120 mg by mouth daily. 120 mg in the am and 80 mg in the pm.      . gabapentin (NEURONTIN) 100 MG capsule Take 3 capsules (300 mg total) by mouth 3 (three) times daily.  60 capsule  1  . hyaluronate sodium (RADIAPLEXRX) GEL Apply 1 application topically 2 (two) times daily.      . Ibuprofen-Diphenhydramine HCl (ADVIL PM) 200-25 MG CAPS Take 3 capsules by mouth at bedtime.        [name] levothyroxine (SYNTHROID, LEVOTHROID) 100 MCG tablet Take 100 mcg by mouth daily.        [name] lidocaine-prilocaine (EMLA) cream Apply 1 application topically as needed. For pain      . lisinopril (PRINIVIL,ZESTRIL) 20 MG tablet Take 20 mg by mouth daily.        [name] LORazepam (ATIVAN) 0.5 MG tablet TAKE ONE TABLET BY MOUTH TWICE DAILY AS NEEDED FOR NAUSEA AND ANXIETY  60 tablet  0  . magnesium oxide (MAG-OX) 400 MG tablet Take 800 mg by mouth daily.        . Melatonin 10 MG CAPS Take 40 mg by mouth at bedtime.      . nitroGLYCERIN (NITROSTAT) 0.4 MG SL tablet Place 0.4 mg under the tongue every 5 (five) minutes as needed. As needed for chest pain.      . non-metallic deodorant Thornton Papas) MISC Apply 1 application topically daily as needed.      [name] omeprazole (PRILOSEC) 20 MG capsule Take 20 mg by mouth 2 (two) times daily.       [name] OVER THE COUNTER MEDICATION Take 3 capsules by mouth every morning. Green Tea Extract OTC.       [name] potassium chloride SA (K-DUR,KLOR-CON) 20 MEQ tablet Take 20 mEq by mouth 2 (two) times daily.      . sertraline (ZOLOFT) 100 MG tablet Take 100 mg by mouth  daily.        . sertraline (ZOLOFT) 50 MG tablet Take 1 tablet (50 mg total) by mouth daily. Patient may take and additional 50 mg if she feels she is having a bad day  30 tablet  3  . traZODone (DESYREL) 50 MG tablet TAKE ONE TABLET BY MOUTH EVERY DAY AT BEDTIME  30 tablet  0  . Wound Cleansers (RADIAPLEX EX) Apply topically.      [name] zolpidem (AMBIEN) 10 MG tablet Take 10 mg by mouth at bedtime.       [name] emollient (BIAFINE) cream Apply topically as needed.       Current Facility-Administered Medications  Medication Dose Route Frequency Provider Last Rate Last Dose  . hyaluronate sodium (RADIAPLEXRX) gel   Topical Once Jonna Coup, MD       Facility-Administered Medications Ordered in Other Encounters  Medication Dose Route Frequency Provider Last Rate Last Dose  . sodium chloride 0.9 % injection 10 mL  10 mL Intracatheter PRN Lowella Dell, MD  ALLERGIES: Aspirin; Docetaxel; Morphine and related; and Penicillins   LABORATORY DATA:  Lab Results  Component Value Date   WBC 2.8* 10/20/2011   HGB 11.2* 10/20/2011   HCT 33.7* 10/20/2011   MCV 85.3 10/20/2011   PLT 196 10/20/2011   Lab Results  Component Value Date   NA 135 10/20/2011   K 4.2 10/20/2011   CL 100 10/20/2011   CO2 24 10/20/2011   Lab Results  Component Value Date   ALT 20 10/20/2011   AST 22 10/20/2011   ALKPHOS 76 10/20/2011   BILITOT 0.3 10/20/2011     NARRATIVE: [name] was seen today for weekly treatment management. The chart was checked and the patient's films were reviewed. The patient finished her treatment today. Her skin held up relatively well for the end of her treatment. The patient is very glad to be finishing today. She is in good spirits.  PHYSICAL EXAMINATION: weight is 189 lb 6.4 oz (85.911 kg). Her blood pressure is 121/85 and her pulse is 82. Her respiration is 18.      No major change in the supraclavicular region in particular. Continued hyperpigmentation in this  area.   ASSESSMENT: The patient had some significant skin irritation but was able to ultimately finish her prescribed course.  PLAN: Followup in one month

## 2011-11-24 NOTE — Progress Notes (Signed)
Patient presents to the clinic today accompanied by her daughter for a PUT with Dr. Mitzi Hansen. Today is this patient's final treatment. Patient is alert and oriented to person, place, and time. No distress noted. Steady gait noted. Pleasant affect noted. Patient denies breast pain at this time. Hyperpigmentation without desquamation of the left breast noted. Encouraged patient to continue to use Radiaplex gel for the next two weeks. Provided patient with [facility] and [facility] flyers then reviewed pertinent information. All questions answered. Patient verbalized understanding of all reviewed. An appointment card for a one month follow up given. Encouraged to call with needs. Reported all findings to Dr. Mitzi Hansen.

## 2011-11-25 ENCOUNTER — Ambulatory Visit: Payer: Medicare Other

## 2011-11-26 ENCOUNTER — Ambulatory Visit: Payer: Medicare Other

## 2011-11-29 ENCOUNTER — Ambulatory Visit: Payer: Medicare Other

## 2011-11-29 DIAGNOSIS — F329 Major depressive disorder, single episode, unspecified: Secondary | ICD-10-CM | POA: Diagnosis not present

## 2011-11-29 DIAGNOSIS — Z79899 Other long term (current) drug therapy: Secondary | ICD-10-CM | POA: Diagnosis not present

## 2011-11-29 DIAGNOSIS — E039 Hypothyroidism, unspecified: Secondary | ICD-10-CM | POA: Diagnosis not present

## 2011-11-29 DIAGNOSIS — M1710 Unilateral primary osteoarthritis, unspecified knee: Secondary | ICD-10-CM | POA: Diagnosis not present

## 2011-11-29 DIAGNOSIS — M545 Low back pain: Secondary | ICD-10-CM | POA: Diagnosis not present

## 2011-12-06 ENCOUNTER — Encounter: Payer: Self-pay | Admitting: *Deleted

## 2011-12-09 ENCOUNTER — Ambulatory Visit: Payer: Medicare Other | Admitting: Oncology

## 2011-12-13 ENCOUNTER — Ambulatory Visit: Payer: Medicare Other | Admitting: Oncology

## 2011-12-13 ENCOUNTER — Telehealth: Payer: Self-pay | Admitting: *Deleted

## 2011-12-13 NOTE — Telephone Encounter (Signed)
Patient's daughter called in and requested to reschedule her mother's appointment till 01-19-2012 at 2:30pm

## 2011-12-14 NOTE — Progress Notes (Signed)
Complex simulation note  Diagnosis: Left Breast cancer  Narrative The patient has initially been planned to receive a course of whole breast radiation to a dose of 50.4 gray in 28 fractions at 1.8 gray per fraction. The patient will receive an additional boost to the seroma cavity which has been contoured. This will correspond to a boost of 10 gray in 5 fractions at 2 gray per fraction. To accomplish this, an additional 3 customized blocks have been designed for this purpose. A complex isodose plan is requested to ensure that the target area is adequately covered with radiation dose and that the nearby normal structures such as the lung are adequately spared. The patient's final total dose will be 60.4 gray.  The patient has experienced significant skin toxicity and therefore will begin this boost treatment early to give the supraclavicular region a break. The plan will then be to resume her original phase of treatment after this boost is completed. We will recheck her skin at the end of this boost.

## 2011-12-14 NOTE — Progress Notes (Signed)
  Radiation Oncology         [phone] ________________________________  Name: [name] MRN: [id number]  Date: 11/24/2011  DOB: 20-Jul-1960  End of Treatment Note  Diagnosis:   Left-sided breast cancer     Indication for treatment:  Curative       Radiation treatment dates:   09/06/2011 route 11/24/2011  Site/dose:   The patient was treated to the left breast and left supraclavicular region. The patient was planned to receive 50.4 gray using a 3D conformal, 4 field technique. This was delivered at 1.8 gray per fraction. The patient also received a boost to the seroma cavity using a three-field, photon technique. This delivered an additional 10 gray at 2 gray per fraction. The patient's final dose was 60.4 gray.  Narrative: The patient initially had some difficulties with raising her arm. She was sent for physical therapy and her mobility did improve and she was able to then resume her treatment. The major side effect during treatment was irritation of the skin, especially in the supraclavicular region. She suffered from significant skin irritation including some moist desquamation at this site. This prompted a change in her plan such that she took a break and she also began her boost treatment earlier than had been planned. The supraclavicular region did begin to heal and she was able to resume her original planned course of treatment after this boost was completed.  Plan: The patient has completed radiation treatment. The patient will return to radiation oncology clinic for routine followup in one month. I advised the patient to call or return sooner if they have any questions or concerns related to their recovery or treatment. ________________________________  Radene Gunning, M.D., Ph.D.

## 2011-12-22 ENCOUNTER — Other Ambulatory Visit: Payer: Self-pay | Admitting: *Deleted

## 2011-12-22 ENCOUNTER — Other Ambulatory Visit: Payer: Self-pay | Admitting: Oncology

## 2011-12-22 DIAGNOSIS — G4700 Insomnia, unspecified: Secondary | ICD-10-CM

## 2011-12-22 DIAGNOSIS — C50919 Malignant neoplasm of unspecified site of unspecified female breast: Secondary | ICD-10-CM

## 2011-12-22 NOTE — Telephone Encounter (Signed)
A user error has taken place: duplicate entry

## 2011-12-23 ENCOUNTER — Encounter: Payer: Self-pay | Admitting: Radiation Oncology

## 2011-12-23 ENCOUNTER — Ambulatory Visit: Admission: RE | Admit: 2011-12-23 | Payer: Medicare Other | Source: Ambulatory Visit | Admitting: Radiation Oncology

## 2011-12-23 HISTORY — DX: Personal history of irradiation: Z92.3

## 2011-12-30 ENCOUNTER — Encounter: Payer: Self-pay | Admitting: Radiation Oncology

## 2011-12-30 ENCOUNTER — Ambulatory Visit
Admission: RE | Admit: 2011-12-30 | Discharge: 2011-12-30 | Disposition: A | Discharge status: Home / Self Care | Payer: Medicare Other | Source: Ambulatory Visit | Attending: Radiation Oncology | Admitting: Radiation Oncology

## 2011-12-30 VITALS — BP 129/66 | HR 75 | Temp 99.0°F | Resp 20 | Wt 193.4 lb

## 2011-12-30 DIAGNOSIS — C50419 Malignant neoplasm of upper-outer quadrant of unspecified female breast: Secondary | ICD-10-CM

## 2011-12-30 NOTE — Progress Notes (Signed)
Radiation Oncology         [phone] ________________________________  Name: [name] MRN: [id number]  Date: 12/30/2011  DOB: [birth year]  Follow-Up Visit Note  CC: Lonie Peak, PA  Shelly Rubenstein, MD  Diagnosis:   Left-sided breast cancer  Interval Since Last Radiation:  One month   Narrative:  The patient returns today for routine follow-up.  The patient indicates that she has done relatively well since she finished treatment. Her skin has been improving. She continues to use Biafine cream on a daily basis. Some residual increased pigmentation. She is also describing some ongoing shooting pains in the left breast area as well as some heaviness in the legs below the knees symmetrically.                              ALLERGIES:  is allergic to aspirin; docetaxel; morphine and related; and penicillins.  Meds: Current Outpatient Prescriptions  Medication Sig Dispense Refill  . carvedilol (COREG) 25 MG tablet Take 25 mg by mouth 2 (two) times daily with a meal.        . emollient (BIAFINE) cream Apply topically as needed.      . furosemide (LASIX) 40 MG tablet Take 80-120 mg by mouth daily. 120 mg in the am and 80 mg in the pm.      . gabapentin (NEURONTIN) 100 MG capsule Take 3 capsules (300 mg total) by mouth 3 (three) times daily.  60 capsule  1  . HYDROcodone-acetaminophen (VICODIN) 5-500 MG per tablet Take 1 tablet by mouth every 6 (six) hours as needed.      . Ibuprofen-Diphenhydramine HCl (ADVIL PM) 200-25 MG CAPS Take 3 capsules by mouth at bedtime.        [name] levothyroxine (SYNTHROID, LEVOTHROID) 100 MCG tablet Take 100 mcg by mouth daily.        [name] lidocaine-prilocaine (EMLA) cream Apply 1 application topically as needed. For pain      . lisinopril (PRINIVIL,ZESTRIL) 20 MG tablet Take 20 mg by mouth daily.        [name] LORazepam (ATIVAN) 0.5 MG tablet TAKE ONE TABLET BY MOUTH TWICE DAILY AS NEEDED FOR NAUSEA AND ANXIETY  60 tablet  0  . magnesium oxide (MAG-OX) 400 MG  tablet Take 800 mg by mouth daily.        . nitroGLYCERIN (NITROSTAT) 0.4 MG SL tablet Place 0.4 mg under the tongue every 5 (five) minutes as needed. As needed for chest pain.      [name] omeprazole (PRILOSEC) 20 MG capsule Take 20 mg by mouth 2 (two) times daily.       [name] OVER THE COUNTER MEDICATION Take 3 capsules by mouth every morning. Green Tea Extract OTC.       [name] potassium chloride SA (K-DUR,KLOR-CON) 20 MEQ tablet Take 20 mEq by mouth 2 (two) times daily.      . sertraline (ZOLOFT) 100 MG tablet Take 100 mg by mouth daily.        . sertraline (ZOLOFT) 50 MG tablet Take 1 tablet (50 mg total) by mouth daily. Patient may take and additional 50 mg if she feels she is having a bad day  30 tablet  3  . traZODone (DESYREL) 50 MG tablet TAKE ONE TABLET BY MOUTH EVERY DAY AT BEDTIME  30 tablet  0  . zolpidem (AMBIEN) 10 MG tablet Take 10 mg by mouth at bedtime.  No current facility-administered medications for this encounter.   Facility-Administered Medications Ordered in Other Encounters  Medication Dose Route Frequency Provider Last Rate Last Dose  . sodium chloride 0.9 % injection 10 mL  10 mL Intracatheter PRN Lowella Dell, MD        Physical Findings: The patient is in no acute distress. Patient is alert and oriented.  weight is 193 lb 6.4 oz (87.726 kg). Her oral temperature is 99 F (37.2 C). Her blood pressure is 129/66 and her pulse is 75. Her respiration is 20. .   The patient's skin shows some hyperpigmentation in both the breast area and to a greater degree in the left supraclavicular region. No ongoing active desquamation. No sign of infection.  Lab Findings: Lab Results  Component Value Date   WBC 2.8* 10/20/2011   HGB 11.2* 10/20/2011   HCT 33.7* 10/20/2011   MCV 85.3 10/20/2011   PLT 196 10/20/2011     Radiographic Findings: No results found.  Impression:    51 year old female status post adjuvant radiotherapy for her left-sided breast cancer. She had significant  skin irritation during treatment but this has healed satisfactorily since she finished. No other new significant symptoms related to acute toxicity. She is continuing to be followed by Dr. Donnie Coffin and sees him in several weeks.  Plan:  The patient will return to our clinic on a when necessary basis.   Radene Gunning, M.D., Ph.D.

## 2011-12-30 NOTE — Progress Notes (Signed)
Patient here f/u left breast ca rad txs:09/06/11-11/24/11 Alert,oriented x3, hyperpigmentation left supraclavicular region and left breast,well healed, still using biafine cream, daily, uses advil pm along with ambien at night , sharp pains occasionally through left breast, doing bette, eating and drinking plenty fluids, mamogram  Not scheduled as yet, goes to [facility]  12:12 PM

## 2012-01-04 DIAGNOSIS — E039 Hypothyroidism, unspecified: Secondary | ICD-10-CM | POA: Diagnosis not present

## 2012-01-19 ENCOUNTER — Ambulatory Visit (HOSPITAL_BASED_OUTPATIENT_CLINIC_OR_DEPARTMENT_OTHER): Payer: Medicare Other | Admitting: Oncology

## 2012-01-19 ENCOUNTER — Telehealth: Payer: Self-pay | Admitting: *Deleted

## 2012-01-19 VITALS — BP 128/91 | HR 83 | Temp 98.8°F | Resp 20 | Ht 59.0 in | Wt 192.5 lb

## 2012-01-19 DIAGNOSIS — C773 Secondary and unspecified malignant neoplasm of axilla and upper limb lymph nodes: Secondary | ICD-10-CM

## 2012-01-19 DIAGNOSIS — R918 Other nonspecific abnormal finding of lung field: Secondary | ICD-10-CM

## 2012-01-19 DIAGNOSIS — C50919 Malignant neoplasm of unspecified site of unspecified female breast: Secondary | ICD-10-CM

## 2012-01-19 NOTE — Telephone Encounter (Signed)
Made patient appointment for mammogram and bone density at the breast center  Made patient appointment for lab and md

## 2012-01-19 NOTE — Progress Notes (Signed)
Hematology and Oncology Follow Up Visit  [name] [id number] 07-25-1960 51 y.o. 06/29/11    HPI: [name] is a 51 year old [facility] [place] woman with a locally advanced ER/PR positive at 99/2% respectively, HER-2 positive, cish ratio of 2.87, left breast carcinoma with biopsy-proven lymph node involvement. Initial staging breast MRI on 02/05/2011 showing a mass measuring 4.9 cm with multiple small nodules without fatty hila in the left axilla suspicious for metastatic disease. She completed 2 cycles of every 3 week neoadjuvant Taxotere/carboplatin, due to significant difficulty with the Taxotere component based i.e. significant PPE changes, and chest pain with her second infusion, her treatment course was switched to every 3 week IV CMF/Herceptin. Currently day 7 cycle 3/4 of every 3 week IV CMF, Herceptin held due to worsening left ventricular ejection fraction. She underwent lumpectomy on 07/16/2011 with lymph node removal. Final pathology showed residual T2 N0 tumor. Residual tumor ER/PR positive and HER-2 positive. Status post completion of radiation therapy to left breast completed 01/19/2012. 2. History of congestive heart failure, echocardiogram from 06/15/2011 revealing a left ventricular ejection fraction between 35 and 40%. Also on Lasix 120 mg in the a.m., 80 mg in the p.m. HCTZ 25 mg by mouth daily, lisinopril 20 mg by mouth daily, Coreg 25 mg by mouth twice a day 3. Hypothyroidism-on Synthroid 100 mcg daily 4. COPD 5. GERD on Prilosec 20 mg by mouth twice a day 6. Depression on Zoloft 100 mg by mouth daily 7. Reported history of myocardial infarction 7 years ago.  Interim History:   . Patient is doing well. She has recovered well from radiation. No complaints. She is somewhat fatigued but otherwise doing well. She is on increased dose of Prozac and feels somewhat better. A detailed review of systems is otherwise noncontributory as noted below.  Review of  Systems: Constitutional: Fatigue. Eyes: excessive tearing ENT:  Cardiovasc complains of mouth soreness and "puffiness" but denies any difficulty swallowing per se. Cor: Denies shortness of breath. Respiratory: positive for - shortness of breath Neurological: no TIA or stroke symptoms Dermatological: negative Gastrointestinal: no abdominal pain, change in bowel habits, or black or bloody stools Genito-Urinary: no dysuria, trouble voiding, or hematuria Hematological and Lymphatic: Negative. Breast: negative for breast lumps Patient unable to palpate L breast mass. Musculoskeletal: negative Remaining ROS negative.   Medications:   I have reviewed the patient's current medications.  Current Outpatient Prescriptions  Medication Sig Dispense Refill  . carvedilol (COREG) 25 MG tablet Take 25 mg by mouth 2 (two) times daily with a meal.        . Cholecalciferol (VITAMIN D-3 PO) Take 1,000 Int'l Units by mouth.      . furosemide (LASIX) 40 MG tablet Take 80-120 mg by mouth daily. 120 mg in the am and 80 mg in the pm.      . HYDROcodone-acetaminophen (VICODIN) 5-500 MG per tablet Take 1 tablet by mouth every 6 (six) hours as needed.      . Ibuprofen-Diphenhydramine HCl (ADVIL PM) 200-25 MG CAPS Take 3 capsules by mouth at bedtime.        [name] levothyroxine (SYNTHROID, LEVOTHROID) 100 MCG tablet Take 100 mcg by mouth daily.       [name] lidocaine-prilocaine (EMLA) cream Apply 1 application topically as needed. For pain      . lisinopril (PRINIVIL,ZESTRIL) 20 MG tablet Take 20 mg by mouth daily.        . magnesium oxide (MAG-OX) 400 MG tablet Take 800 mg by mouth  daily.        . nitroGLYCERIN (NITROSTAT) 0.4 MG SL tablet Place 0.4 mg under the tongue every 5 (five) minutes as needed. As needed for chest pain.      [name] omeprazole (PRILOSEC) 20 MG capsule Take 20 mg by mouth 2 (two) times daily.       [name] OVER THE COUNTER MEDICATION Take 3 capsules by mouth every morning. Green Tea Extract OTC.       [name]  oxyCODONE-acetaminophen (PERCOCET/ROXICET) 5-325 MG per tablet Take 1 tablet by mouth every 4 (four) hours as needed.      . potassium chloride SA (K-DUR,KLOR-CON) 20 MEQ tablet Take 20 mEq by mouth 2 (two) times daily.      . sertraline (ZOLOFT) 100 MG tablet Take 100 mg by mouth daily.        . sertraline (ZOLOFT) 50 MG tablet Take 1 tablet (50 mg total) by mouth daily. Patient may take and additional 50 mg if she feels she is having a bad day  30 tablet  3  . traZODone (DESYREL) 50 MG tablet TAKE ONE TABLET BY MOUTH EVERY DAY AT BEDTIME  30 tablet  0  . zolpidem (AMBIEN) 10 MG tablet Take 10 mg by mouth at bedtime.       . gabapentin (NEURONTIN) 100 MG capsule Take 3 capsules (300 mg total) by mouth 3 (three) times daily.  60 capsule  1   No current facility-administered medications for this visit.   Facility-Administered Medications Ordered in Other Visits  Medication Dose Route Frequency Provider Last Rate Last Dose  . sodium chloride 0.9 % injection 10 mL  10 mL Intracatheter PRN Lowella Dell, MD        Allergies:  Allergies  Allergen Reactions  . Aspirin Hives  . Docetaxel Other (See Comments)    Chest tightness and pain  . Morphine And Related Hives and Swelling  . Penicillins Hives    Physical Exam: Filed Vitals:   01/19/12 1441  BP: 128/91  Pulse: 83  Temp: 98.8 F (37.1 C)  Resp: 20  Weight: 197 lbs. HEENT:  Sclerae Slightly injected but no evidence of any today., conjunctivae pink,  Oropharynx shows evidence of possible blistering which is very vague in the buccal mucosa. Question viral etiology.  No frank evidence to suggest oral mucositis or candidiasis.   Nodes:  No cervical, supraclavicular, or axillary lymphadenopathy palpated.  Breast Exam: lt breast , s/p lumpectomy with well healed scar tenderness in the left axilla no masses appreciated right breast normal. Lungs: Clear to auscultation bilaterally.  No crackles, rhonchi, or wheezes.   Heart:  Regular  rate and rhythm, mild right-sided JVD. Abdomen:  Soft, nontender.  Positive bowel sounds.  No organomegaly or masses palpated.   Musculoskeletal:  No focal spinal tenderness to palpation, she does have some tenderness over her left shoulder region, no evidence of any palpable cords erythema or lymphedema.  Extremities:  Benign.  No peripheral edema or cyanosis.   Skin:  Benign.   Neuro:  Nonfocal, alert and oriented x 3.   Lab Results: Lab Results  Component Value Date   WBC 2.8* 10/20/2011   HGB 11.2* 10/20/2011   HCT 33.7* 10/20/2011   MCV 85.3 10/20/2011   PLT 196 10/20/2011   NEUTROABS 1.7 10/20/2011     Chemistry      Component Value Date/Time   NA 135 10/20/2011 1324   K 4.2 10/20/2011 1324   CL 100 10/20/2011 1324  CO2 24 10/20/2011 1324   BUN 22 10/20/2011 1324   CREATININE 1.32* 10/20/2011 1324      Component Value Date/Time   CALCIUM 9.4 10/20/2011 1324   ALKPHOS 76 10/20/2011 1324   AST 22 10/20/2011 1324   ALT 20 10/20/2011 1324   BILITOT 0.3 10/20/2011 1324     Echocardiogram: 06/15/11 Study Conclusions - Left ventricle: The cavity size was normal. Systolic function was moderately reduced. The estimated ejection fraction was in the range of 35% to 40%. Diffuse hypokinesis. - Ventricular septum: Septal motion showed abnormal function and dyssynergy. Transthoracic echocardiography. M-mode, complete 2D, spectral Doppler, and color Doppler. Patient status: Inpatient. Location: Echo laboratory. Prepared and Electronically Authenticated by Everette Rank, MD     Assessment:  [name] is a 51 year old [facility] [place] woman with a locally advanced ER/PR positive at 99/2% respectively, HER-2 positive with the cish ratio of 2.87, left breast carcinoma with biopsy-proven lymph node involvement. Initial staging breast MRI on 02/05/2011 showing a mass measuring 4.9 cm with multiple small nodules without fatty hila in the left axilla suspicious for metastatic disease. She  completed 2 cycles of every 3 week neoadjuvant Taxotere/carboplatin, due to significant difficulty with the Taxotere component based i.e. significant PPE changes, and chest pain with her second infusion, her treatment course was switched to every 3 week IV CMF/Herceptin. Currently  day 1,cycle 4/4 of every 3 week IV CMF/Herceptin.   Status post completion of surgery and radiation.  3. History of congestive heart failure, echocardiogram from 01/23 2013 revealing a left ventricular ejection fraction between 50 and 55%. Also on Lasix 120 mg in the a.m., 80 mg in the p.m. HCTZ 25 mg by mouth daily, lisinopril 20 mg by mouth daily, Coreg 25 mg by mouth twice a day. Worsening left ventricular ejection fraction reported between 35-40% on echocardiogram obtained on 06/15/2011. Patient is clinically asymptomatic.  4. Hypothyroidism-on Synthroid 100 mcg daily 5. COPD  6. GERD on Prilosec 20 mg by mouth twice a day  7. Depression on Zoloft 100 mg by mouth daily      Plan:  Patient returns for followup. She is that she feels pretty well. She is on a higher dose of Prozac which makes her feel better. She is also taking trazodone at night to help her sleep as well Xanax Coumadin. Overall performance status is fairly good. She does do a followup mammogram which will be due sometime in December. In addition discussed adjuvant hormonal therapy as she is ER/PR positive. She wants to hold off before starting the medicine. I have agreed we'll see her in the year her after she's had a bone density test as well as a followup mammogram. I discussed adjuvant hormonal therapy and its importance in her case as she did not fully complete Herceptin based therapy.   30 minutes spent with the patient half the time and patient-related counseling.  Pierce Crane MD

## 2012-01-30 ENCOUNTER — Other Ambulatory Visit: Payer: Self-pay | Admitting: Oncology

## 2012-02-04 ENCOUNTER — Other Ambulatory Visit: Payer: Self-pay | Admitting: *Deleted

## 2012-02-04 DIAGNOSIS — C50919 Malignant neoplasm of unspecified site of unspecified female breast: Secondary | ICD-10-CM

## 2012-02-04 MED ORDER — ANASTROZOLE 1 MG PO TABS: 1.0000 mg | ORAL_TABLET | Freq: Every day | ORAL | Status: DC

## 2012-02-08 DIAGNOSIS — I10 Essential (primary) hypertension: Secondary | ICD-10-CM | POA: Diagnosis not present

## 2012-02-08 DIAGNOSIS — R51 Headache: Secondary | ICD-10-CM | POA: Diagnosis not present

## 2012-02-08 DIAGNOSIS — I509 Heart failure, unspecified: Secondary | ICD-10-CM | POA: Diagnosis not present

## 2012-02-23 ENCOUNTER — Other Ambulatory Visit: Payer: Self-pay | Admitting: Oncology

## 2012-02-23 DIAGNOSIS — C50419 Malignant neoplasm of upper-outer quadrant of unspecified female breast: Secondary | ICD-10-CM

## 2012-03-21 ENCOUNTER — Other Ambulatory Visit: Payer: Self-pay | Admitting: Oncology

## 2012-03-31 ENCOUNTER — Telehealth: Payer: Self-pay

## 2012-03-31 NOTE — Telephone Encounter (Signed)
Spoke with [name] at [facility] and approved change of hydrocodone 5/500 to 5/325  With same directions as written previously.

## 2012-04-03 ENCOUNTER — Telehealth: Payer: Self-pay | Admitting: Emergency Medicine

## 2012-04-03 NOTE — Telephone Encounter (Signed)
Called patient and gave her new appointment date and times for labs and Dr Darnelle Catalan on 2/5 at 11:00 and 11:30.  Patient verbalized understanding.  Patient requesting mammogram and bone density be rescheduled.  Will call the breast center and call the patient back with those appointments.

## 2012-04-13 ENCOUNTER — Encounter: Payer: Self-pay | Admitting: Oncology

## 2012-04-14 ENCOUNTER — Other Ambulatory Visit: Payer: Self-pay | Admitting: *Deleted

## 2012-04-14 DIAGNOSIS — C50419 Malignant neoplasm of upper-outer quadrant of unspecified female breast: Secondary | ICD-10-CM

## 2012-04-17 ENCOUNTER — Encounter: Payer: Self-pay | Admitting: Oncology

## 2012-04-17 NOTE — Progress Notes (Signed)
Patient came in and I gave her a new application for assistance. She also inquired about a bill. I gave her the address to [employer] and phone number. She was going by there.

## 2012-04-18 ENCOUNTER — Ambulatory Visit: Payer: Medicare Other | Admitting: Oncology

## 2012-04-18 ENCOUNTER — Other Ambulatory Visit: Payer: Medicare Other | Admitting: Lab

## 2012-04-19 ENCOUNTER — Other Ambulatory Visit: Payer: Self-pay

## 2012-04-20 ENCOUNTER — Telehealth: Payer: Self-pay | Admitting: Oncology

## 2012-04-20 ENCOUNTER — Other Ambulatory Visit: Payer: Medicare Other

## 2012-04-20 NOTE — Telephone Encounter (Signed)
Pt lmonvm re appt. Transfer pt PR to GM. Message to [place].

## 2012-04-24 ENCOUNTER — Telehealth: Payer: Self-pay | Admitting: *Deleted

## 2012-04-24 NOTE — Telephone Encounter (Signed)
Pt called and left me a message and I called and left her a message to call me back.

## 2012-04-25 ENCOUNTER — Other Ambulatory Visit: Payer: Self-pay | Admitting: *Deleted

## 2012-04-25 ENCOUNTER — Ambulatory Visit: Payer: Medicare Other | Admitting: Oncology

## 2012-04-25 ENCOUNTER — Other Ambulatory Visit: Payer: Medicare Other | Admitting: Lab

## 2012-04-25 DIAGNOSIS — C50919 Malignant neoplasm of unspecified site of unspecified female breast: Secondary | ICD-10-CM

## 2012-04-26 ENCOUNTER — Ambulatory Visit: Payer: Medicare Other | Admitting: Oncology

## 2012-04-26 ENCOUNTER — Other Ambulatory Visit: Payer: Medicare Other | Admitting: Lab

## 2012-04-26 ENCOUNTER — Encounter: Payer: Self-pay | Admitting: *Deleted

## 2012-04-26 ENCOUNTER — Telehealth: Payer: Self-pay | Admitting: Oncology

## 2012-04-26 NOTE — Telephone Encounter (Signed)
Copy of 2/5 pof left for lisa re r/s appt............. Transfer pt. Also gv note to lisa this morning re pt call to r/s.

## 2012-04-27 ENCOUNTER — Telehealth: Payer: Self-pay | Admitting: *Deleted

## 2012-04-27 ENCOUNTER — Encounter: Payer: Self-pay | Admitting: Oncology

## 2012-04-27 NOTE — Telephone Encounter (Signed)
Confirmed 05/10/12 appt w/ pt.  Mailed letter & calendar to pt.  Emailed Amy to let her know that I placed pt on her schedule per Dr. Thea Silversmith.

## 2012-05-03 ENCOUNTER — Ambulatory Visit
Admission: RE | Admit: 2012-05-03 | Discharge: 2012-05-03 | Disposition: A | Discharge status: Home / Self Care | Payer: Medicare Other | Source: Ambulatory Visit | Attending: Oncology | Admitting: Oncology

## 2012-05-03 DIAGNOSIS — C50919 Malignant neoplasm of unspecified site of unspecified female breast: Secondary | ICD-10-CM

## 2012-05-03 DIAGNOSIS — Z780 Asymptomatic menopausal state: Secondary | ICD-10-CM | POA: Diagnosis not present

## 2012-05-03 DIAGNOSIS — Z853 Personal history of malignant neoplasm of breast: Secondary | ICD-10-CM | POA: Diagnosis not present

## 2012-05-10 ENCOUNTER — Ambulatory Visit: Payer: Medicare Other | Admitting: Lab

## 2012-05-10 ENCOUNTER — Telehealth: Payer: Self-pay | Admitting: Oncology

## 2012-05-10 ENCOUNTER — Ambulatory Visit (HOSPITAL_BASED_OUTPATIENT_CLINIC_OR_DEPARTMENT_OTHER): Payer: Medicare Other | Admitting: Physician Assistant

## 2012-05-10 ENCOUNTER — Ambulatory Visit (HOSPITAL_COMMUNITY)
Admission: RE | Admit: 2012-05-10 | Discharge: 2012-05-10 | Disposition: A | Discharge status: Home / Self Care | Payer: Medicare Other | Source: Ambulatory Visit | Attending: Physician Assistant | Admitting: Physician Assistant

## 2012-05-10 ENCOUNTER — Encounter: Payer: Self-pay | Admitting: Physician Assistant

## 2012-05-10 VITALS — BP 132/81 | HR 96 | Temp 98.6°F | Resp 20 | Ht 59.0 in | Wt 203.6 lb

## 2012-05-10 DIAGNOSIS — M7989 Other specified soft tissue disorders: Secondary | ICD-10-CM | POA: Insufficient documentation

## 2012-05-10 DIAGNOSIS — F329 Major depressive disorder, single episode, unspecified: Secondary | ICD-10-CM | POA: Diagnosis not present

## 2012-05-10 DIAGNOSIS — C50919 Malignant neoplasm of unspecified site of unspecified female breast: Secondary | ICD-10-CM | POA: Diagnosis not present

## 2012-05-10 DIAGNOSIS — M79609 Pain in unspecified limb: Secondary | ICD-10-CM | POA: Diagnosis not present

## 2012-05-10 DIAGNOSIS — M25539 Pain in unspecified wrist: Secondary | ICD-10-CM | POA: Diagnosis not present

## 2012-05-10 LAB — CBC WITH DIFFERENTIAL/PLATELET
Basophils Absolute: 0 10*3/uL (ref 0.0–0.1)
Eosinophils Absolute: 0 10*3/uL (ref 0.0–0.5)
HCT: 32.9 % — ABNORMAL LOW (ref 34.8–46.6)
HGB: 11.3 g/dL — ABNORMAL LOW (ref 11.6–15.9)
LYMPH%: 36.9 % (ref 14.0–49.7)
MCV: 89.4 fL (ref 79.5–101.0)
MONO#: 0.4 10*3/uL (ref 0.1–0.9)
NEUT#: 2.5 10*3/uL (ref 1.5–6.5)
NEUT%: 53.2 % (ref 38.4–76.8)
Platelets: 222 10*3/uL (ref 145–400)
RBC: 3.68 10*6/uL — ABNORMAL LOW (ref 3.70–5.45)
WBC: 4.6 10*3/uL (ref 3.9–10.3)

## 2012-05-10 LAB — COMPREHENSIVE METABOLIC PANEL (CC13)
BUN: 17.8 mg/dL (ref 7.0–26.0)
CO2: 30 mEq/L — ABNORMAL HIGH (ref 22–29)
Glucose: 83 mg/dl (ref 70–99)
Sodium: 142 mEq/L (ref 136–145)
Total Bilirubin: 0.23 mg/dL (ref 0.20–1.20)
Total Protein: 8.1 g/dL (ref 6.4–8.3)

## 2012-05-10 MED ORDER — ANASTROZOLE 1 MG PO TABS: 1.0000 mg | ORAL_TABLET | Freq: Every day | ORAL | Status: DC

## 2012-05-10 NOTE — Telephone Encounter (Signed)
gve the pt her June 2014 appt calendar. Sent the pt back to have a lab done. Pt is aware of her x-rays for today at [facility]

## 2012-05-10 NOTE — Progress Notes (Signed)
ID: [name]   DOB: 20-Jun-1960  MR#: [id number]  WUJ#:[id number]  PCP: Lonie Peak, PA GYN:  SU: Abigail Miyamoto, MD OTHER MD:   HISTORY OF PRESENT ILLNESS: [name] noted a change in her left breast in October 2012. Dr. Anna Genre set her up for mammography at the [facility] 01/29/2011, and this showed an irregular high-density mass with ill-defined margins in the upper outer quadrant of the left breast. This was palpable and mobile. Ultrasound showed an irregular hypoechoic mass with lobulated margins measuring up to 2.9 cm. One left axillary lymph node was noted to be enlarged at 2.6 cm. There were no other abnormal lymph nodes noted.   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  With this information the patient was set up for bilateral breast MRIs 02/05/2011 showing in the left breast mass in question to measure up to 4.9 cm. There were multiple small nodules without fatty hila in the left axilla, suspicious for metastatic disease there. The right side was unremarkable and there was no suspicious internal mammary adenopathy.   On 02/08/2011 the patient underwent biopsy of the largest left axillary lymph node, and this was positive([id number]).  Patient was treated in the neoadjuvant setting and received 2 cycles of every 3 week docetaxel/carboplatin. This was discontinued after cycle 2 due to in tolerance with associated chest pain and significant PPE in both the upper and lower extremities. Her treatment course was switched to Q3 week IV CMF/ trastuzumab, but the trastuzumab was held secondary to worsening left ventricular ejection fraction a history of congestive heart failure. (Echo on 06/15/2011 showed an EF between 35 and 40%.)   Essentially, the patient  completed 4 q. three-week doses of  CMF, with last cycle given on 06/22/2011.   She is status post lumpectomy under the care of Dr. Magnus Ivan on 07/16/2011 with what node removal, final pathology showing a residual T2 N0 tumor. Residual tumor was again ER and PR positive and HER-2/neu positive.  Patient received radiation therapy under the care of Dr. Mitzi Hansen from June of 2013 until September of 2013. She was then started on anastrozole at 1 mg daily in early November 2013.  Additional history is as detailed below.   INTERVAL HISTORY: [name] returns today for routine followup of her left breast carcinoma. [name] was originally a patient of Dr. Darnelle Catalan, then transferred during active chemotherapy to Dr. Renelda Loma care. She is now being established back and Dr. Darrall Dears care since the departure of Dr. Donnie Coffin in December.  Hatsuko began on anastrozole in November 2013, and is tolerating the medication well. She does have chronic pain, but it does not seem that this pain has increased significantly since beginning this medication. She continues to have hot flashes.  Interval history is remarkable for a fall in November that apparently injured [name]'s left hand. She's had quite a bit of swelling in the left hand in addition to pain and decreased strength. She has not had this x-rayed or evaluated.  REVIEW OF SYSTEMS: [name] has had no recent illnesses and denies fevers or chills. She's had no skin changes. She denies abnormal bleeding. She's eating and drinking well. She is concerned about her weight gain. She has no nausea or emesis. She continues to have some chronic problems with constipation although  this is not a change for her. She has never had a screening colonoscopy. She denies any cough or increased shortness of breath and has had no new chest pain, pressure, or palpitations. No abnormal headaches or dizziness.  A detailed review of systems is otherwise stable and noncontributory.   PAST MEDICAL  HISTORY: Past Medical History  Diagnosis Date  . Depression   . Hypothyroidism   . Congestive heart failure   . Emphysema of lung   . GERD (gastroesophageal reflux disease)   . Heart murmur   . Hypertension   . Anemia 05/19/2011  . Nonischemic cardiomyopathy     Cardiologist Dr. Armanda Magic  . Pneumonia   . History of bronchitis   . Shortness of breath 07/16/11    "at all times"  . Blood transfusion   . Chronic lower back pain   . Breast cancer 07/16/2011    left breast lumpectomy=invasive ductal ca,2. cm,high grade ductal ca in situ,11 benign lymph nodes  . Obesity   . Allergy   . Neuromuscular disorder     tingling left hand  . Myocardial infarction 2003  . Hyperlipidemia   . History of radiation therapy 09/06/11-11/24/11    left breast  60.4gray total dose    PAST SURGICAL HISTORY: Past Surgical History  Procedure Laterality Date  . Portacath placement  02/26/2011    Procedure: INSERTION PORT-A-CATH;  Surgeon: Shelly Rubenstein, MD;  Location: [facility] OR;  Service: General;  Laterality: Right;  . Breast biopsy      left breast  . Breast lumpectomy  07/16/11    w/LND; left.ER/PR=positive  . Abdominal hysterectomy  ~2006    partial  . Tubal ligation  1991    FAMILY HISTORY Family History  Problem Relation Age of Onset  . Leukemia Mother   . Heart disease Maternal Grandmother   The patient's father died at the age of 59 from renal failure. The patient's mother died at the age of 76 from acute leukemia. The patient has 2 brothers and 2 sisters, there is no history of breast or ovarian cancer in the family to her knowledge.   GYNECOLOGIC HISTORY: Menarche age 29, menopause 2005, the patient never took hormone replacement. She is GX P3, first pregnancy to term age 45.  S/p TAH with right salpingo-oophorectomy remotely.  SOCIAL HISTORY: The patient is currently unemployed. She is divorced and lives by herself. Her children are [name], lives in [place] and works for the  post office, she is 51 years old; [name], 26 a lives in [place]. [place] and works for [employer], and [name], 21, who lives in [place] and works in daycare. The patient has 5 grandchildren.  She attends the [employer] in [place].     ADVANCED DIRECTIVES:  HEALTH MAINTENANCE: History  Substance Use Topics  . Smoking status: Former Smoker -- 1.00 packs/day for 40 years    Types: Cigarettes    Quit date: 02/20/2011  . Smokeless tobacco: Never Used  . Alcohol Use: 0.0 oz/week     Comment: 07/16/11 "wine coolers; eve     Colonoscopy: never  PAP: not since hysterectomy  Bone density: Feb 2014, Normal  Lipid panel: UTD, Dr. Anna Genre  Allergies  Allergen Reactions  . Aspirin Hives  . Docetaxel Other (See Comments)    Chest tightness and pain  . Morphine And Related Hives and Swelling  . Penicillins Hives    Current Outpatient Prescriptions  Medication Sig Dispense Refill  .  anastrozole (ARIMIDEX) 1 MG tablet Take 1 tablet (1 mg total) by mouth daily.  90 tablet  3  . carvedilol (COREG) 25 MG tablet Take 25 mg by mouth 2 (two) times daily with a meal.        . Cholecalciferol (VITAMIN D-3 PO) Take 1,000 Int'l Units by mouth.      . furosemide (LASIX) 40 MG tablet Take 80-120 mg by mouth daily. 120 mg in the am and 80 mg in the pm.      . gabapentin (NEURONTIN) 100 MG capsule Take 3 capsules (300 mg total) by mouth 3 (three) times daily.  60 capsule  1  . HYDROcodone-acetaminophen (VICODIN) 5-500 MG per tablet Take 1 tablet by mouth every 6 (six) hours as needed.      . Ibuprofen-Diphenhydramine HCl (ADVIL PM) 200-25 MG CAPS Take 3 capsules by mouth at bedtime.        [name] levothyroxine (SYNTHROID, LEVOTHROID) 100 MCG tablet Take 100 mcg by mouth daily.       [name] lidocaine-prilocaine (EMLA) cream Apply 1 application topically as needed. For pain      . lisinopril (PRINIVIL,ZESTRIL) 20 MG tablet Take 20 mg by mouth daily.        . magnesium oxide (MAG-OX) 400  MG tablet Take 800 mg by mouth daily.        . mupirocin ointment (BACTROBAN) 2 % APPLY   TOPICALLY TO AFFECTED AREA AS DIRECTED  30 g  2  . nitroGLYCERIN (NITROSTAT) 0.4 MG SL tablet Place 0.4 mg under the tongue every 5 (five) minutes as needed. As needed for chest pain.      [name] omeprazole (PRILOSEC) 20 MG capsule Take 20 mg by mouth 2 (two) times daily.       [name] OVER THE COUNTER MEDICATION Take 3 capsules by mouth every morning. Green Tea Extract OTC.       [name] oxyCODONE-acetaminophen (PERCOCET/ROXICET) 5-325 MG per tablet Take 1 tablet by mouth every 4 (four) hours as needed.      . potassium chloride SA (K-DUR,KLOR-CON) 20 MEQ tablet Take 20 mEq by mouth 2 (two) times daily.      . sertraline (ZOLOFT) 100 MG tablet Take 100 mg by mouth daily.        . sertraline (ZOLOFT) 50 MG tablet Take 1 tablet (50 mg total) by mouth daily.  30 tablet  2  . traZODone (DESYREL) 50 MG tablet TAKE ONE TABLET BY MOUTH EVERY DAY AT BEDTIME  30 tablet  0  . zolpidem (AMBIEN) 10 MG tablet Take 10 mg by mouth at bedtime.        No current facility-administered medications for this visit.   Facility-Administered Medications Ordered in Other Visits  Medication Dose Route Frequency Provider Last Rate Last Dose  . sodium chloride 0.9 % injection 10 mL  10 mL Intracatheter PRN Lowella Dell, MD        OBJECTIVE: Middle-aged [place] American female in no acute distress Filed Vitals:   05/10/12 1156  BP: 132/81  Pulse: 96  Temp: 98.6 F (37 C)  Resp: 20     Body mass index is 41.1 kg/(m^2).    ECOG FS: 1 Filed Weights   05/10/12 1156  Weight: 203 lb 9.6 oz (92.352 kg)   Sclerae unicteric No cervical or supraclavicular adenopathy Lungs clear to auscultation, no rales or rhonchi Heart regular rate and rhythm Abdomen soft, nontender, positive bowel sounds MSK no focal spinal tenderness to gentle palpation There  is nonpitting edema in the left hand extending from the wrist to the fingertips. Strength is  decreased in the left hand to a 3-4/5, compared to 5 out of 5 on the right hand. No additional peripheral edema Neuro: nonfocal, well oriented Breasts: Right breast is unremarkable. Left breast is status post lumpectomy and radiation. No suspicious nodularity or evidence of local recurrence. Axillae are benign bilaterally with no palpable adenopathy.   LAB RESULTS: Lab Results  Component Value Date   WBC 2.8* 10/20/2011   NEUTROABS 1.7 10/20/2011   HGB 11.2* 10/20/2011   HCT 33.7* 10/20/2011   MCV 85.3 10/20/2011   PLT 196 10/20/2011      Chemistry      Component Value Date/Time   NA 135 10/20/2011 1324   K 4.2 10/20/2011 1324   CL 100 10/20/2011 1324   CO2 24 10/20/2011 1324   BUN 22 10/20/2011 1324   CREATININE 1.32* 10/20/2011 1324      Component Value Date/Time   CALCIUM 9.4 10/20/2011 1324   ALKPHOS 76 10/20/2011 1324   AST 22 10/20/2011 1324   ALT 20 10/20/2011 1324   BILITOT 0.3 10/20/2011 1324       Lab Results  Component Value Date   LABCA2 24 02/10/2011    STUDIES: Dg Bone Density  05/03/2012  *RADIOLOGY REPORT*  Clinical Data: Post menopausal with history of hysterectomy  DUAL X-RAY ABSORPTIOMETRY (DXA) FOR BONE MINERAL DENSITY  AP LUMBAR SPINE  Bone Mineral Density (BMD):            0.979 g/cm2 Young Adult T Score:                          -0.6 Z Score:                                                0.1   LEFT FEMUR NECK  Bone Mineral Density (BMD):              0.814 g/cm2 Young Adult T Score:                            -0.3 Z Score:                                                 0.5  ASSESSMENT:  Patient's diagnostic category is NORMAL by WHO Criteria.  FRACTURE RISK:  NOT INCREASED  FRAX: World Health Organization FRAX assessment of absolute fracture risk is not calculated for this patient because the patient has normal bone density.  RECOMMENDATIONS:  Effective therapies are available in the form of bisphosphonates, selective estrogen receptor modulators, biologic agents,  and hormone replacement therapy (for women).  All patients should ensure an adequate intake of dietary calcium (1200mg  daily) and vitamin D (800 IU daily) unless contraindicated.  All treatment decisions require clinical judgement and consideration of individual patient factors, including patient preferences, co-morbidities, previous drug use, risk factors not captured in the FRAX model (e.g., frailty, falls, vitamin D deficiency, increased bone turnover, interval significant decline in bone density) and possible under-or over-estimation of fracture risk by FRAX.  The National Osteoporosis  Foundation recommends that FDA-approved medical therapies be considered in postmenopausal women and mean age 63 or older with a:        1)     Hip or vertebral (clinical or morphometric) fracture.           2)    T-score of -2.5 or lower at the spine or hip. 3)    Ten-year fracture probability by FRAX of 3% or greater for hip fracture or 20% or greater for major osteoporotic fracture. FOLLOW-UP:  People with diagnosed cases of osteoporosis or at high risk for fracture should have regular bone mineral density tests.  For patients eligible for Medicare, routine testing is allowed once every 2 years.  The testing frequency can be increased to one year for patients who have rapidly progressing disease, those who are receiving or discontinuing medical therapy to restore bone mass, or have additional risk factors.  World Science writer [facility]) Criteria:  Normal: T scores from +1.0 to -1.0 Low Bone Mass (Osteopenia): T scores between -1.0 and -2.5 Osteoporosis: T scores -2.5 and below  Comparison to Reference Population:  T score is the key measure used in the diagnosis of osteoporosis and relative risk determination for fracture.  It provides a value for bone mass relative to the mean bone mass of a young adult reference population expressed in terms of standard deviation (SD).  Z score is the age-matched score showing the patient's  values compared to a population matched for age, sex, and race.  This is also expressed in terms of standard deviation.  The patient may have values that compare favorably to the age-matched values and still be at increased risk for fracture.   Original Report Authenticated By: Esperanza Heir, M.D.    Mm Digital Diagnostic Bilat  05/03/2012  *RADIOLOGY REPORT*  Clinical Data:  History of malignant lumpectomy of the left breast in April 2013.  Reevaluation.  DIGITAL DIAGNOSTIC BILATERAL MAMMOGRAM WITH CAD  Comparison: 07/16/2011, 01/29/2011  Findings:  ACR Breast Density Category 3: The breast tissue is heterogeneously dense.  There are scarring changes located within the upper-outer quadrant of the left breast related to the patient's lumpectomy.  There is no specific evidence for recurrent tumor or developing malignancy within either breast.  There is mild diffuse skin thickening associated with the left breast related to the patient's radiation therapy.  Mammographic images were processed with CAD.  IMPRESSION: No findings worrisome for recurrent tumor or developing malignancy.  RECOMMENDATION: Recommend bilateral diagnostic mammography in 1 year.  I have discussed the findings and recommendations with the patient. Results were also provided in writing at the conclusion of the visit.  BI-RADS CATEGORY 1:  Negative.   Original Report Authenticated By: Rolla Plate, M.D.      ASSESSMENT: 52 y.o.  [facility] woman  (1) status post left breast biopsy on November 2012 for an invasive ductal carcinoma, grade 2, which was estrogen receptor positive at 99%, progesterone receptor weakly positive at 2%, with an MIB-1 of 61%, and HER-2 amplification by CISH with a ratio of 2.87.   Biopsy of a left axillary node was also positive for cancer.  (2)  treated in the neoadjuvant setting and received 2 cycles of every 3 week docetaxel/carboplatin. This was discontinued after cycle 2 due to in tolerance with associated  chest pain and significant PPE in both the upper and lower extremities.  (3)   Her treatment course was switched to Q3 week IV CMF/ trastuzumab, but the trastuzumab was  held secondary to worsening left ventricular ejection fraction a history of congestive heart failure. (Echo on 06/15/2011 showed an EF between 35 and 40%.)   Essentially, the patient completed 4 q. three-week doses of  CMF, with last cycle given on 06/22/2011.   (4)  status post lumpectomy under the care of Dr. Magnus Ivan on 07/16/2011 with what node removal, final pathology showing a residual T2 N0 tumor. Residual tumor was again ER and PR positive and HER-2/neu positive.  (5)  Patient received radiation therapy under the care of Dr. Mitzi Hansen from June of 2013 until September of 2013.   (6)  She was then started on anastrozole at 1 mg daily in early November 2013.  (7)  multiple comorbidities including hypothyroidism, history of congestive heart failure, COPD, GERD, and depression.   PLAN: With regards to her breast cancer, Morissa appears to be doing extremely well, and will continue on the anastrozole which I have refilled for her today. We will obtain a CBC metabolic panel and she leaves today for routine screening.  I am referring Dynasti for an x-ray of the left hand and wrist to evaluate for fracture or injury. Depending on the results, we will consider a referral to Dr. Teressa Senter for further evaluation of pain and swelling in the hand.  I am also referring her back to Dr. Rayburn Ma for port removal.  We also discussed the importance of routine health maintenance which would include a screening colonoscopy, and she'll discuss this further with her primary care physician in [place], Dr. Lonie Peak.  Otherwise she'll return to see [place] in approximately 3-4 months. If she is still tolerating the anastrozole well and is having no additional problems, we will likely go to a q. 6 month schedule at that point.  Tiffiny voices understanding  and agreement with our plan and will call with any changes or problems.  Dohn Stclair    05/10/2012

## 2012-05-10 NOTE — Progress Notes (Signed)
I contacted the patient this afternoon to give her the results of her x-rays of the left wrist and hand. Fortunately they showed no fracture, no acute abnormalities, and only soft tissue edema. I have offered her an appointment at the lymphedema clinic, or a referral to Dr. Teressa Senter for further evaluation. At this time, the patient tells me she "is going to be okay" and declines either referral. If she changes her mind, she'll give [place] a call back and would be glad to place those referrals at any time.  Zollie Scale, PA-C 05/10/2012

## 2012-05-13 ENCOUNTER — Other Ambulatory Visit: Payer: Self-pay | Admitting: Oncology

## 2012-05-15 DIAGNOSIS — M79609 Pain in unspecified limb: Secondary | ICD-10-CM | POA: Diagnosis not present

## 2012-05-30 ENCOUNTER — Encounter (INDEPENDENT_AMBULATORY_CARE_PROVIDER_SITE_OTHER): Payer: Self-pay | Admitting: Surgery

## 2012-06-27 DIAGNOSIS — M109 Gout, unspecified: Secondary | ICD-10-CM | POA: Diagnosis not present

## 2012-06-27 DIAGNOSIS — E039 Hypothyroidism, unspecified: Secondary | ICD-10-CM | POA: Diagnosis not present

## 2012-06-27 DIAGNOSIS — M545 Low back pain: Secondary | ICD-10-CM | POA: Diagnosis not present

## 2012-07-11 ENCOUNTER — Ambulatory Visit (INDEPENDENT_AMBULATORY_CARE_PROVIDER_SITE_OTHER): Payer: Medicare Other | Admitting: Surgery

## 2012-07-11 ENCOUNTER — Encounter (INDEPENDENT_AMBULATORY_CARE_PROVIDER_SITE_OTHER): Payer: Self-pay | Admitting: Surgery

## 2012-07-11 VITALS — BP 128/88 | HR 89 | Temp 96.3°F | Ht <= 58 in | Wt 200.8 lb

## 2012-07-11 DIAGNOSIS — Z853 Personal history of malignant neoplasm of breast: Secondary | ICD-10-CM | POA: Diagnosis not present

## 2012-07-11 MED ORDER — POTASSIUM CHLORIDE CRYS ER 20 MEQ PO TBCR: 20.0000 meq | EXTENDED_RELEASE_TABLET | Freq: Two times a day (BID) | ORAL | Status: DC

## 2012-07-11 NOTE — Progress Notes (Signed)
Subjective:     Patient ID: [name], female   DOB: [birth year], 52 y.o.   MRN: [id number]  HPI She is here today to discuss Port-A-Cath removal. Her chemotherapy finished several months ago and she is doing well. She has had no cardiopulmonary issues  Review of Systems     Objective:   Physical Exam Her lungs are clear. Cardiovascular is regular rate and rhythm. The right chest port site is well healed    Assessment:     History of breast cancer no longer needing Port-A-Cath     Plan:     I will now schedule her for Port-A-Cath removal

## 2012-07-19 ENCOUNTER — Encounter (HOSPITAL_COMMUNITY): Payer: Self-pay | Admitting: Pharmacy Technician

## 2012-07-24 ENCOUNTER — Encounter (HOSPITAL_COMMUNITY): Payer: Self-pay

## 2012-07-24 ENCOUNTER — Encounter (HOSPITAL_COMMUNITY)
Admission: RE | Admit: 2012-07-24 | Discharge: 2012-07-24 | Disposition: A | Discharge status: Home / Self Care | Payer: Medicare Other | Source: Ambulatory Visit | Attending: Surgery | Admitting: Surgery

## 2012-07-24 ENCOUNTER — Ambulatory Visit (HOSPITAL_COMMUNITY)
Admission: RE | Admit: 2012-07-24 | Discharge: 2012-07-24 | Disposition: A | Discharge status: Home / Self Care | Payer: Medicare Other | Source: Ambulatory Visit | Attending: Surgery | Admitting: Surgery

## 2012-07-24 DIAGNOSIS — Z01818 Encounter for other preprocedural examination: Secondary | ICD-10-CM | POA: Diagnosis not present

## 2012-07-24 DIAGNOSIS — Z87891 Personal history of nicotine dependence: Secondary | ICD-10-CM | POA: Diagnosis not present

## 2012-07-24 DIAGNOSIS — Z01810 Encounter for preprocedural cardiovascular examination: Secondary | ICD-10-CM | POA: Diagnosis not present

## 2012-07-24 DIAGNOSIS — C50919 Malignant neoplasm of unspecified site of unspecified female breast: Secondary | ICD-10-CM | POA: Diagnosis not present

## 2012-07-24 DIAGNOSIS — Z01812 Encounter for preprocedural laboratory examination: Secondary | ICD-10-CM | POA: Diagnosis not present

## 2012-07-24 LAB — BASIC METABOLIC PANEL
CO2: 29 mEq/L (ref 19–32)
Calcium: 9.8 mg/dL (ref 8.4–10.5)
Creatinine, Ser: 1.19 mg/dL — ABNORMAL HIGH (ref 0.50–1.10)
Glucose, Bld: 91 mg/dL (ref 70–99)

## 2012-07-24 LAB — CBC
HCT: 32.8 % — ABNORMAL LOW (ref 36.0–46.0)
Hemoglobin: 10.8 g/dL — ABNORMAL LOW (ref 12.0–15.0)
MCHC: 32.9 g/dL (ref 30.0–36.0)
MCV: 89.6 fL (ref 78.0–100.0)
RDW: 13.2 % (ref 11.5–15.5)

## 2012-07-24 LAB — SURGICAL PCR SCREEN: Staphylococcus aureus: NEGATIVE

## 2012-07-24 NOTE — Patient Instructions (Addendum)
20      Your procedure is scheduled on:  Monday 07/31/2012  Report to [facility] at 0815  AM.  Call this number if you have problems the morning of surgery: [phone]   Remember:             IF YOU USE CPAP,BRING MASK AND TUBING AM OF SURGERY!   Do not eat food or drink liquids AFTER MIDNIGHT!  Take these medicines the morning of surgery with A SIP OF WATER: Arimidex, Levothyroxine, Carvedilol, Zoloft, Omeprazole   Do not bring valuables to the hospital.  .  Leave suitcase in the car. After surgery it may be brought to your room.  For patients admitted to the hospital, checkout time is 11:00 AM the day of              Discharge.    DO NOT WEAR JEWELRY , MAKE-UP, LOTIONS,POWDERS,PERFUMES!             WOMEN -DO NOT SHAVE LEGS OR UNDERARMS 12 HRS. BEFORE  SURGERY!               MEN MAY SHAVE AS USUAL!             CONTACTS,DENTURES OR BRIDGEWORK, FALSE EYELASHES MAY  NOT BE WORN INTO SURGERY!                                           Patients discharged the day of surgery will not be allowed to drive home. If going home the same day of surgery, must have someone stay with you first 24 hrs.at home and arrange for someone to drive you home from the Hospital.            YOUR DRIVER IS: Donna Christen- half sister   Special Instructions:             Please read over the following fact sheets that you were given:             1. [facility] PREPARING FOR SURGERY SHEET              2.MRSA INFORMATION              3.INCENTIVE SPIROMETRY                                        Telford Nab.Nanney,RN,BSN     [phone]                FAILURE TO FOLLOW THESE INSTRUCTIONS MAY RESULT IN  CANCELLATION OF YOUR SURGERY!               Patient Signature:___________________________

## 2012-07-24 NOTE — Progress Notes (Signed)
Office visit from Dr.Traci Turner 04/23/2011, Nuclear Stress test notes and resting EKG and report from [facility] 05/14/2011, Echocardiogram from [facility] 04/14/2011, EKG from [facility] 09/15/2010, Echocardiogram from [facility] 10/08/2009,Lower Extremity Arterial and Venous Duplex Report from [facility] 10/08/2009, Nocturnal Polysomnogram 06/11/2004 from [facility], all on chart.

## 2012-07-27 ENCOUNTER — Telehealth: Payer: Self-pay | Admitting: *Deleted

## 2012-07-27 NOTE — Telephone Encounter (Signed)
On 07-27-12 fax medical records request to michele at [facility], it was consult note, end of tx note, sim and planning note.

## 2012-07-27 NOTE — Progress Notes (Signed)
Pt notified of time change to 10:20 am instructed to arrive at short stay at 7:45 am NPO after midnight

## 2012-07-29 NOTE — H&P (Signed)
  Subjective:   Patient ID: [name], female DOB: 07/29/1960, 52 y.o. MRN: [id number]  HPI   This is a patient of mine who is well-known with a history of breast cancer status post chemotherapy She is here today to discuss Port-A-Cath removal. Her chemotherapy finished several months ago and she is doing well. She has had no cardiopulmonary issues   Review of Systems  She denies chest pain, fever, chills, or any other complaints  Her past medical history, surgical history, family history, medications, allergies or unchanged  Objective:   Physical Exam  Afebrile vital signs stable Her lungs are clear. Cardiovascular is regular rate and rhythm. The right chest port site is well healed   Assessment:   History of breast cancer no longer needing Port-A-Cath   Plan:   I will now schedule her for Port-A-Cath removal.  I discussed the surgery in detail. I discussed the risks which includes but is not limited to bleeding and infection. She understands and wishes to proceed. Surgery will be scheduled

## 2012-07-31 ENCOUNTER — Encounter (HOSPITAL_COMMUNITY)
Admission: RE | Disposition: A | Discharge status: Home / Self Care | Payer: Self-pay | Source: Ambulatory Visit | Attending: Surgery

## 2012-07-31 ENCOUNTER — Encounter (HOSPITAL_COMMUNITY): Payer: Self-pay | Admitting: Anesthesiology

## 2012-07-31 ENCOUNTER — Ambulatory Visit (HOSPITAL_COMMUNITY)
Admission: RE | Admit: 2012-07-31 | Discharge: 2012-07-31 | Disposition: A | Discharge status: Home / Self Care | Payer: Medicare Other | Source: Ambulatory Visit | Attending: Surgery | Admitting: Surgery

## 2012-07-31 ENCOUNTER — Encounter (HOSPITAL_COMMUNITY): Payer: Self-pay | Admitting: *Deleted

## 2012-07-31 ENCOUNTER — Ambulatory Visit (HOSPITAL_COMMUNITY): Payer: Medicare Other | Admitting: Anesthesiology

## 2012-07-31 DIAGNOSIS — C50919 Malignant neoplasm of unspecified site of unspecified female breast: Secondary | ICD-10-CM | POA: Diagnosis not present

## 2012-07-31 DIAGNOSIS — E039 Hypothyroidism, unspecified: Secondary | ICD-10-CM | POA: Insufficient documentation

## 2012-07-31 DIAGNOSIS — F3289 Other specified depressive episodes: Secondary | ICD-10-CM | POA: Insufficient documentation

## 2012-07-31 DIAGNOSIS — Z9221 Personal history of antineoplastic chemotherapy: Secondary | ICD-10-CM | POA: Insufficient documentation

## 2012-07-31 DIAGNOSIS — F329 Major depressive disorder, single episode, unspecified: Secondary | ICD-10-CM | POA: Insufficient documentation

## 2012-07-31 DIAGNOSIS — J449 Chronic obstructive pulmonary disease, unspecified: Secondary | ICD-10-CM | POA: Insufficient documentation

## 2012-07-31 DIAGNOSIS — Z452 Encounter for adjustment and management of vascular access device: Secondary | ICD-10-CM

## 2012-07-31 DIAGNOSIS — I509 Heart failure, unspecified: Secondary | ICD-10-CM | POA: Diagnosis not present

## 2012-07-31 DIAGNOSIS — G4730 Sleep apnea, unspecified: Secondary | ICD-10-CM | POA: Diagnosis not present

## 2012-07-31 DIAGNOSIS — Z853 Personal history of malignant neoplasm of breast: Secondary | ICD-10-CM | POA: Insufficient documentation

## 2012-07-31 DIAGNOSIS — J4489 Other specified chronic obstructive pulmonary disease: Secondary | ICD-10-CM | POA: Insufficient documentation

## 2012-07-31 DIAGNOSIS — K219 Gastro-esophageal reflux disease without esophagitis: Secondary | ICD-10-CM | POA: Diagnosis not present

## 2012-07-31 DIAGNOSIS — I252 Old myocardial infarction: Secondary | ICD-10-CM | POA: Diagnosis not present

## 2012-07-31 DIAGNOSIS — I10 Essential (primary) hypertension: Secondary | ICD-10-CM | POA: Diagnosis not present

## 2012-07-31 HISTORY — PX: PORT-A-CATH REMOVAL: SHX5289

## 2012-07-31 SURGERY — REMOVAL PORT-A-CATH
Anesthesia: Monitor Anesthesia Care | Site: Chest | Wound class: Clean

## 2012-07-31 MED ORDER — FENTANYL CITRATE 0.05 MG/ML IJ SOLN: 25.0000 ug | INTRAMUSCULAR | Status: DC | PRN

## 2012-07-31 MED ORDER — LACTATED RINGERS IV SOLN
INTRAVENOUS | Status: DC
  Administered 2012-07-31: 1000 mL via INTRAVENOUS

## 2012-07-31 MED ORDER — LACTATED RINGERS IV SOLN
INTRAVENOUS | Status: DC
Start: 2012-07-31 — End: 2012-07-31

## 2012-07-31 MED ORDER — MIDAZOLAM HCL 5 MG/5ML IJ SOLN
INTRAMUSCULAR | Status: DC | PRN
  Administered 2012-07-31: 2 mg via INTRAVENOUS

## 2012-07-31 MED ORDER — HYDROCODONE-ACETAMINOPHEN 5-500 MG PO TABS: 1.0000 | ORAL_TABLET | Freq: Four times a day (QID) | ORAL | Status: DC | PRN

## 2012-07-31 MED ORDER — FENTANYL CITRATE 0.05 MG/ML IJ SOLN
INTRAMUSCULAR | Status: DC | PRN
  Administered 2012-07-31: 50 ug via INTRAVENOUS

## 2012-07-31 MED ORDER — KETAMINE HCL 10 MG/ML IJ SOLN
INTRAMUSCULAR | Status: DC | PRN
  Administered 2012-07-31 (×2): 20 mg via INTRAVENOUS

## 2012-07-31 MED ORDER — LIDOCAINE HCL 1 % IJ SOLN
INTRAMUSCULAR | Status: AC
  Filled 2012-07-31: qty 20

## 2012-07-31 MED ORDER — CIPROFLOXACIN IN D5W 400 MG/200ML IV SOLN
400.0000 mg | INTRAVENOUS | Status: AC
  Administered 2012-07-31: 400 mg via INTRAVENOUS

## 2012-07-31 MED ORDER — CIPROFLOXACIN IN D5W 400 MG/200ML IV SOLN
INTRAVENOUS | Status: AC
  Filled 2012-07-31: qty 200

## 2012-07-31 MED ORDER — LIDOCAINE HCL (PF) 1 % IJ SOLN
INTRAMUSCULAR | Status: DC | PRN
  Administered 2012-07-31: 14 mL

## 2012-07-31 MED ORDER — 0.9 % SODIUM CHLORIDE (POUR BTL) OPTIME
TOPICAL | Status: DC | PRN
  Administered 2012-07-31: 1000 mL

## 2012-07-31 MED ORDER — PROPOFOL 10 MG/ML IV BOLUS
INTRAVENOUS | Status: DC | PRN
  Administered 2012-07-31 (×2): 20 mg via INTRAVENOUS
  Administered 2012-07-31: 30 mg via INTRAVENOUS

## 2012-07-31 MED ORDER — BUPIVACAINE HCL (PF) 0.5 % IJ SOLN
INTRAMUSCULAR | Status: AC
  Filled 2012-07-31: qty 30

## 2012-07-31 MED ORDER — PROMETHAZINE HCL 25 MG/ML IJ SOLN: 6.2500 mg | INTRAMUSCULAR | Status: DC | PRN

## 2012-07-31 MED ORDER — MEPERIDINE HCL 50 MG/ML IJ SOLN: 6.2500 mg | INTRAMUSCULAR | Status: DC | PRN

## 2012-07-31 SURGICAL SUPPLY — 43 items
BANDAGE ELASTIC 6 VELCRO ST LF (GAUZE/BANDAGES/DRESSINGS) ×2 IMPLANT
BENZOIN TINCTURE PRP APPL 2/3 (GAUZE/BANDAGES/DRESSINGS) ×2 IMPLANT
BLADE SURG 15 STRL LF DISP TIS (BLADE) ×3 IMPLANT
BLADE SURG 15 STRL SS (BLADE) ×3
CANISTER SUCTION 2500CC (MISCELLANEOUS) ×2 IMPLANT
CLEANER TIP ELECTROSURG 2X2 (MISCELLANEOUS) ×2 IMPLANT
CLOTH BEACON ORANGE TIMEOUT ST (SAFETY) ×2 IMPLANT
DECANTER SPIKE VIAL GLASS SM (MISCELLANEOUS) ×2 IMPLANT
DERMABOND ADVANCED (GAUZE/BANDAGES/DRESSINGS)
DERMABOND ADVANCED .7 DNX12 (GAUZE/BANDAGES/DRESSINGS) IMPLANT
DRAPE LAPAROTOMY TRNSV 102X78 (DRAPE) ×2 IMPLANT
DRSG TEGADERM 2-3/8X2-3/4 SM (GAUZE/BANDAGES/DRESSINGS) ×2 IMPLANT
ELECT COATED BLADE 2.86 ST (ELECTRODE) ×2 IMPLANT
ELECT REM PT RETURN 9FT ADLT (ELECTROSURGICAL) ×2
ELECTRODE REM PT RTRN 9FT ADLT (ELECTROSURGICAL) ×1 IMPLANT
GAUZE SPONGE 2X2 8PLY STRL LF (GAUZE/BANDAGES/DRESSINGS) ×1 IMPLANT
GAUZE SPONGE 4X4 16PLY XRAY LF (GAUZE/BANDAGES/DRESSINGS) ×2 IMPLANT
GLOVE BIO SURGEON STRL SZ7 (GLOVE) ×4 IMPLANT
GLOVE BIOGEL PI IND STRL 7.0 (GLOVE) ×1 IMPLANT
GLOVE BIOGEL PI INDICATOR 7.0 (GLOVE) ×1
GLOVE SURG SIGNA 7.5 PF LTX (GLOVE) ×2 IMPLANT
GOWN STRL NON-REIN LRG LVL3 (GOWN DISPOSABLE) ×2 IMPLANT
GOWN STRL REIN XL XLG (GOWN DISPOSABLE) ×4 IMPLANT
KIT BASIN OR (CUSTOM PROCEDURE TRAY) ×2 IMPLANT
MARKER SKIN DUAL TIP RULER LAB (MISCELLANEOUS) ×2 IMPLANT
NEEDLE HYPO 22GX1.5 SAFETY (NEEDLE) ×2 IMPLANT
NEEDLE HYPO 25X1 1.5 SAFETY (NEEDLE) ×2 IMPLANT
PACK BASIC VI WITH GOWN DISP (CUSTOM PROCEDURE TRAY) ×2 IMPLANT
PENCIL BUTTON HOLSTER BLD 10FT (ELECTRODE) ×2 IMPLANT
SOL PREP POV-IOD 16OZ 10% (MISCELLANEOUS) ×2 IMPLANT
SPONGE GAUZE 2X2 STER 10/PKG (GAUZE/BANDAGES/DRESSINGS) ×1
SPONGE GAUZE 4X4 12PLY (GAUZE/BANDAGES/DRESSINGS) ×2 IMPLANT
SPONGE LAP 4X18 X RAY DECT (DISPOSABLE) ×2 IMPLANT
STRIP CLOSURE SKIN 1/2X4 (GAUZE/BANDAGES/DRESSINGS) ×2 IMPLANT
STRIP CLOSURE SKIN 1/4X3 (GAUZE/BANDAGES/DRESSINGS) ×4 IMPLANT
SUT MNCRL AB 4-0 PS2 18 (SUTURE) ×2 IMPLANT
SUT VIC AB 3-0 SH 27 (SUTURE) ×1
SUT VIC AB 3-0 SH 27XBRD (SUTURE) ×1 IMPLANT
SYR BULB IRRIGATION 50ML (SYRINGE) ×2 IMPLANT
SYR CONTROL 10ML LL (SYRINGE) ×2 IMPLANT
TAPE CLOTH SURG 4X10 WHT LF (GAUZE/BANDAGES/DRESSINGS) ×2 IMPLANT
TOWEL OR 17X26 10 PK STRL BLUE (TOWEL DISPOSABLE) ×2 IMPLANT
YANKAUER SUCT BULB TIP 10FT TU (MISCELLANEOUS) ×2 IMPLANT

## 2012-07-31 NOTE — Interval H&P Note (Signed)
History and Physical Interval Note: no change in H and P  07/31/2012 8:07 AM  Sunday Corn  has presented today for surgery, with the diagnosis of un needed PAC   The various methods of treatment have been discussed with the patient and family. After consideration of risks, benefits and other options for treatment, the patient has consented to  Procedure(s): REMOVAL PORT-A-CATH (N/A) as a surgical intervention .  The patient's history has been reviewed, patient examined, no change in status, stable for surgery.  I have reviewed the patient's chart and labs.  Questions were answered to the patient's satisfaction.     [name] A

## 2012-07-31 NOTE — Anesthesia Preprocedure Evaluation (Addendum)
Anesthesia Evaluation  Patient identified by MRN, date of birth, ID band Patient awake    Reviewed: Allergy & Precautions, H&P , NPO status , Patient's Chart, lab work & pertinent test results  Airway Mallampati: II TM Distance: >3 FB Neck ROM: Full    Dental no notable dental hx.    Pulmonary sleep apnea , COPD No CPAP breath sounds clear to auscultation  Pulmonary exam normal       Cardiovascular hypertension, Pt. on home beta blockers and Pt. on medications + angina + Past MI and +CHF + Valvular Problems/Murmurs Rhythm:Regular Rate:Normal     Neuro/Psych PSYCHIATRIC DISORDERS Depression negative neurological ROS     GI/Hepatic Neg liver ROS, GERD-  Medicated,  Endo/Other  Hypothyroidism Morbid obesity  Renal/GU negative Renal ROS  negative genitourinary   Musculoskeletal negative musculoskeletal ROS (+)   Abdominal (+) + obese,   Peds negative pediatric ROS (+)  Hematology negative hematology ROS (+)   Anesthesia Other Findings   Reproductive/Obstetrics negative OB ROS                          Anesthesia Physical  Anesthesia Plan  ASA: III  Anesthesia Plan: MAC   Post-op Pain Management:    Induction: Intravenous  Airway Management Planned:   Additional Equipment:   Intra-op Plan:   Post-operative Plan:   Informed Consent: I have reviewed the patients History and Physical, chart, labs and discussed the procedure including the risks, benefits and alternatives for the proposed anesthesia with the patient or authorized representative who has indicated his/her understanding and acceptance.   Dental advisory given  Plan Discussed with: CRNA  Anesthesia Plan Comments:        Anesthesia Quick Evaluation

## 2012-07-31 NOTE — Transfer of Care (Signed)
Immediate Anesthesia Transfer of Care Note  Patient: [name]  Procedure(s) Performed: Procedure(s): REMOVAL PORT-A-CATH (N/A)  Patient Location: PACU  Anesthesia Type:MAC  Level of Consciousness: sedated  Airway & Oxygen Therapy: Patient Spontanous Breathing and Patient connected to face mask oxygen  Post-op Assessment: Report given to PACU RN and Post -op Vital signs reviewed and stable  Post vital signs: Reviewed and stable  Complications: No apparent anesthesia complications

## 2012-07-31 NOTE — Op Note (Signed)
REMOVAL PORT-A-CATH  Procedure Note  [name] 07/31/2012   Pre-op Diagnosis: un needed PAC      Post-op Diagnosis: sam3  Procedure(s): REMOVAL PORT-A-CATH  Surgeon(s): Shelly Rubenstein, MD  Anesthesia: Monitor Anesthesia Care  Staff:  Circulator: Loann Quill Dertouzos, RN Scrub Person: Tammy Gordy Levan, CST Circulator Assistant: Gerda Diss, RN  Estimated Blood Loss: Minimal               Indications: She is here today for Port-A-Cath removal as she no longer needs it because she has finished her chemotherapy  Procedure: The patient was brought to the operating room and identified as the correct patient. She was placed supine on the operating room table and anesthesia was induced. Her right chest was then prepped and draped in the usual sterile fashion. I anesthetized the skin at the previous scar with lidocaine. I then made an incision through the scar with a scalpel. I took this down to the port which was easily elevated. The previously placed Prolene sutures were excised and the port and catheter were removed in its entirety. I then closed the catheter track with a figure-of-eight 2-0 Vicryl suture. I then reapproximated the subcutaneous tissue with interrupted 2-0 Vicryl sutures and closed the skin with a running 4-0 Monocryl suture. Steri-Strips, gauze, and Tegaderm were then applied. The patient tolerated the procedure well. All the counts were correct at the end of the procedure. The patient was then taken in a stable condition from the operating room to the recovery room.          Pierrette Scheu A   Date: 07/31/2012  Time: 9:50 AM

## 2012-07-31 NOTE — Progress Notes (Signed)
Patient states he had chest pain 1 week ago. She had an EKG in pre surgical testing. No chest pain since

## 2012-07-31 NOTE — Anesthesia Postprocedure Evaluation (Signed)
  Anesthesia Post-op Note  Patient: [name]  Procedure(s) Performed: Procedure(s) (LRB): REMOVAL PORT-A-CATH (N/A)  Patient Location: PACU  Anesthesia Type: MAC  Level of Consciousness: awake and alert   Airway and Oxygen Therapy: Patient Spontanous Breathing  Post-op Pain: mild  Post-op Assessment: Post-op Vital signs reviewed, Patient's Cardiovascular Status Stable, Respiratory Function Stable, Patent Airway and No signs of Nausea or vomiting  Last Vitals:  Filed Vitals:   07/31/12 1015  BP:   Pulse: 67  Temp:   Resp: 13    Post-op Vital Signs: stable   Complications: No apparent anesthesia complications

## 2012-08-01 ENCOUNTER — Encounter (HOSPITAL_COMMUNITY): Payer: Self-pay | Admitting: Surgery

## 2012-08-01 ENCOUNTER — Telehealth (INDEPENDENT_AMBULATORY_CARE_PROVIDER_SITE_OTHER): Payer: Self-pay | Admitting: General Surgery

## 2012-08-01 NOTE — Telephone Encounter (Signed)
Spoke with patient she states per DR Magnus Ivan no po f/u needed for port placement only if needed .

## 2012-08-07 ENCOUNTER — Other Ambulatory Visit: Payer: Self-pay | Admitting: Oncology

## 2012-08-07 DIAGNOSIS — M109 Gout, unspecified: Secondary | ICD-10-CM | POA: Diagnosis not present

## 2012-08-07 DIAGNOSIS — F329 Major depressive disorder, single episode, unspecified: Secondary | ICD-10-CM | POA: Diagnosis not present

## 2012-08-07 DIAGNOSIS — E039 Hypothyroidism, unspecified: Secondary | ICD-10-CM | POA: Diagnosis not present

## 2012-09-04 ENCOUNTER — Other Ambulatory Visit: Payer: Self-pay | Admitting: Physician Assistant

## 2012-09-04 DIAGNOSIS — Z780 Asymptomatic menopausal state: Secondary | ICD-10-CM

## 2012-09-04 DIAGNOSIS — C50919 Malignant neoplasm of unspecified site of unspecified female breast: Secondary | ICD-10-CM

## 2012-09-04 DIAGNOSIS — E559 Vitamin D deficiency, unspecified: Secondary | ICD-10-CM

## 2012-09-05 ENCOUNTER — Ambulatory Visit (HOSPITAL_BASED_OUTPATIENT_CLINIC_OR_DEPARTMENT_OTHER): Payer: Medicare Other | Admitting: Physician Assistant

## 2012-09-05 ENCOUNTER — Other Ambulatory Visit (HOSPITAL_BASED_OUTPATIENT_CLINIC_OR_DEPARTMENT_OTHER): Payer: Medicare Other | Admitting: Lab

## 2012-09-05 ENCOUNTER — Encounter: Payer: Self-pay | Admitting: Physician Assistant

## 2012-09-05 ENCOUNTER — Encounter: Payer: Self-pay | Admitting: Internal Medicine

## 2012-09-05 ENCOUNTER — Other Ambulatory Visit (HOSPITAL_BASED_OUTPATIENT_CLINIC_OR_DEPARTMENT_OTHER): Payer: Medicare Other

## 2012-09-05 ENCOUNTER — Telehealth: Payer: Self-pay | Admitting: *Deleted

## 2012-09-05 VITALS — BP 126/86 | HR 76 | Temp 98.3°F | Resp 20 | Ht 59.0 in | Wt 200.5 lb

## 2012-09-05 DIAGNOSIS — D649 Anemia, unspecified: Secondary | ICD-10-CM

## 2012-09-05 DIAGNOSIS — C50919 Malignant neoplasm of unspecified site of unspecified female breast: Secondary | ICD-10-CM

## 2012-09-05 DIAGNOSIS — R300 Dysuria: Secondary | ICD-10-CM | POA: Diagnosis not present

## 2012-09-05 DIAGNOSIS — K5900 Constipation, unspecified: Secondary | ICD-10-CM

## 2012-09-05 DIAGNOSIS — C50912 Malignant neoplasm of unspecified site of left female breast: Secondary | ICD-10-CM

## 2012-09-05 LAB — URINALYSIS, MICROSCOPIC - CHCC
Blood: NEGATIVE
Protein: NEGATIVE mg/dL
Specific Gravity, Urine: 1.005 (ref 1.003–1.035)
Urobilinogen, UR: 0.2 mg/dL (ref 0.2–1)
pH: 7.5 (ref 4.6–8.0)

## 2012-09-05 LAB — COMPREHENSIVE METABOLIC PANEL (CC13)
ALT: 17 U/L (ref 0–55)
AST: 18 U/L (ref 5–34)
Calcium: 9.6 mg/dL (ref 8.4–10.4)
Chloride: 104 mEq/L (ref 98–107)
Creatinine: 1.2 mg/dL — ABNORMAL HIGH (ref 0.6–1.1)
Total Bilirubin: 0.24 mg/dL (ref 0.20–1.20)

## 2012-09-05 LAB — CBC WITH DIFFERENTIAL/PLATELET
BASO%: 0.2 % (ref 0.0–2.0)
EOS%: 0 % (ref 0.0–7.0)
HCT: 32.1 % — ABNORMAL LOW (ref 34.8–46.6)
MCH: 29.8 pg (ref 25.1–34.0)
MCHC: 33.8 g/dL (ref 31.5–36.0)
NEUT%: 55.6 % (ref 38.4–76.8)
lymph#: 1.1 10*3/uL (ref 0.9–3.3)

## 2012-09-05 MED ORDER — ANASTROZOLE 1 MG PO TABS: 1.0000 mg | ORAL_TABLET | Freq: Every day | ORAL | Status: DC

## 2012-09-05 NOTE — Progress Notes (Signed)
ID: [name]   DOB: 09/22/1960  MR#: [id number]  WUJ#:[id number]  PCP: Arlyss Queen GYN:  SU: Abigail Miyamoto, MD OTHER MD:   HISTORY OF PRESENT ILLNESS: Breeley noted a change in her left breast in October 2012. Dr. Anna Genre set her up for mammography at the [facility] 01/29/2011, and this showed an irregular high-density mass with ill-defined margins in the upper outer quadrant of the left breast. This was palpable and mobile. Ultrasound showed an irregular hypoechoic mass with lobulated margins measuring up to 2.9 cm. One left axillary lymph node was noted to be enlarged at 2.6 cm. There were no other abnormal lymph nodes noted.   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  With this information the patient was set up for bilateral breast MRIs 02/05/2011 showing in the left breast mass in question to measure up to 4.9 cm. There were multiple small nodules without fatty hila in the left axilla, suspicious for metastatic disease there. The right side was unremarkable and there was no suspicious internal mammary adenopathy.   On 02/08/2011 the patient underwent biopsy of the largest left axillary lymph node, and this was positive([id number]).  Patient was treated in the neoadjuvant setting and received 2 cycles of every 3 week docetaxel/carboplatin. This was discontinued after cycle 2 due to in tolerance with associated chest pain and significant PPE in both the upper and lower extremities. Her treatment course was switched to Q3 week IV CMF/ trastuzumab, but the trastuzumab was held secondary to worsening left ventricular ejection fraction a history of congestive heart failure. (Echo on 06/15/2011 showed an EF between 35 and 40%.)   Essentially, the patient  completed 4 q. three-week doses of  CMF, with last cycle given on 06/22/2011.   She is status post lumpectomy under the care of Dr. Magnus Ivan on 07/16/2011 with what node removal, final pathology showing a residual T2 N0 tumor. Residual tumor was again ER and PR positive and HER-2/neu positive.  Patient received radiation therapy under the care of Dr. Mitzi Hansen from June of 2013 until September of 2013. She was then started on anastrozole at 1 mg daily in early November 2013.  Additional history is as detailed below.   INTERVAL HISTORY: [name] returns today for routine followup of her left breast carcinoma. She continues on anastrozole.  She does have hot flashes and also notes some vaginal dryness. She has mild dysuria, and reports an odor when she urinates. She denies any hematuria. She's had no vaginal bleeding and denies any obvious vaginal discharge.  She has some chronic joint pain, primarily affecting the knees and the left hand. The joint pain appears to be unrelated to the anastrozole, and in fact has been diagnosed by Dr. Anna Genre to be gout. He continues to follow [facility] on a regular basis.  Otherwise interval history is unremarkable.  REVIEW OF SYSTEMS: Calyn has had no recent illnesses and denies fevers or chills. She's had no skin changes or rashes and denies abnormal bleeding. She's eating and drinking well.  She has no nausea or emesis. She continues to have some chronic problems with constipation, and utilizes both MiraLAX and magnesium citrate on a regular basis. She has never had a screening colonoscopy. She denies any blood in the stool, but does have  some occasional abdominal pain.  She continues to feel fatigued, and has shortness of breath with exertion. She denies any new cough, phlegm production, chest pain, palpitations.  No abnormal headaches or dizziness.  A detailed review of systems is otherwise stable and noncontributory.   PAST MEDICAL HISTORY: Past Medical History   Diagnosis Date  . Depression   . Hypothyroidism   . Congestive heart failure   . Emphysema of lung   . GERD (gastroesophageal reflux disease)   . Heart murmur   . Hypertension   . Anemia 05/19/2011  . Nonischemic cardiomyopathy     Cardiologist Dr. Armanda Magic  . Pneumonia   . History of bronchitis   . Shortness of breath 07/16/11    "at all times"  . Blood transfusion   . Chronic lower back pain   . Breast cancer 07/16/2011    left breast lumpectomy=invasive ductal ca,2. cm,high grade ductal ca in situ,11 benign lymph nodes  . Obesity   . Allergy   . Neuromuscular disorder     tingling left hand  . Myocardial infarction 2003  . Hyperlipidemia   . History of radiation therapy 09/06/11-11/24/11    left breast  60.4gray total dose    PAST SURGICAL HISTORY: Past Surgical History  Procedure Laterality Date  . Portacath placement  02/26/2011    Procedure: INSERTION PORT-A-CATH;  Surgeon: Shelly Rubenstein, MD;  Location: [facility] OR;  Service: General;  Laterality: Right;  . Breast biopsy      left breast  . Breast lumpectomy  07/16/11    w/LND; left.ER/PR=positive  . Abdominal hysterectomy  ~2006    partial  . Tubal ligation  1991  . Port-a-cath removal N/A 07/31/2012    Procedure: REMOVAL PORT-A-CATH;  Surgeon: Shelly Rubenstein, MD;  Location: [facility] ORS;  Service: General;  Laterality: N/A;    FAMILY HISTORY Family History  Problem Relation Age of Onset  . Leukemia Mother   . Heart disease Maternal Grandmother   The patient's father died at the age of 65 from renal failure. The patient's mother died at the age of 47 from acute leukemia. The patient has 2 brothers and 2 sisters, there is no history of breast or ovarian cancer in the family to her knowledge.   GYNECOLOGIC HISTORY: Menarche age 46, menopause 2005, the patient never took hormone replacement. She is GX P3, first pregnancy to term age 49.  S/p TAH with right salpingo-oophorectomy remotely.  SOCIAL HISTORY: The  patient is currently unemployed. She is divorced and lives by herself. Her children are Aijah Lattner, lives in [place] and works for the post [employer], she is 51 years old; [name], 26 a lives in [place] and works for [employer], and [name], 21, who lives in [place] and works in daycare. The patient has 5 grandchildren.  She attends the [employer] in [place].     ADVANCED DIRECTIVES:  HEALTH MAINTENANCE: History  Substance Use Topics  . Smoking status: Former Smoker -- 1.00 packs/day for 40 years    Types: Cigarettes    Quit date: 02/20/2011  . Smokeless tobacco: Never Used  . Alcohol Use: 0.0 oz/week     Comment: 07/16/11 "wine coolers; eve     Colonoscopy: never  PAP: not since hysterectomy  Bone density: Feb 2014, Normal  Lipid panel: UTD, Dr. Anna Genre  Allergies  Allergen Reactions  . Aspirin Hives  . Docetaxel Other (See Comments)    Chest tightness and pain  .  Morphine And Related Hives and Swelling  . Penicillins Hives    Current Outpatient Prescriptions  Medication Sig Dispense Refill  . anastrozole (ARIMIDEX) 1 MG tablet Take 1 tablet (1 mg total) by mouth daily.  90 tablet  3  . carvedilol (COREG) 25 MG tablet Take 25 mg by mouth 2 (two) times daily with a meal.       . furosemide (LASIX) 40 MG tablet Take 80-120 mg by mouth daily. 120 mg in the am and 80 mg in the pm.      . HYDROcodone-acetaminophen (VICODIN) 5-500 MG per tablet Take 1 tablet by mouth every 6 (six) hours as needed.  30 tablet  1  . levothyroxine (SYNTHROID, LEVOTHROID) 75 MCG tablet Take 75 mcg by mouth every morning.      [name] lisinopril (PRINIVIL,ZESTRIL) 20 MG tablet Take 20 mg by mouth every morning.       . Melatonin 10 MG TABS Take 3 tablets by mouth at bedtime.      . Menthol, Topical Analgesic, (ZIMS MAX-FREEZE) 3.7 % GEL Apply 1 application topically daily as needed (for pain). Apply to back and legs      . mupirocin ointment (BACTROBAN) 2 % Apply 1  application topically daily as needed (for itching of scar tissue).      . nicotine (NICODERM CQ - DOSED IN MG/24 HOURS) 21 mg/24hr patch Place 1 patch onto the skin daily.      . nitroGLYCERIN (NITROSTAT) 0.4 MG SL tablet Place 0.4 mg under the tongue every 5 (five) minutes as needed. As needed for chest pain.      [name] omeprazole (PRILOSEC) 20 MG capsule Take 20 mg by mouth 2 (two) times daily.       [name] OVER THE COUNTER MEDICATION Take 3 capsules by mouth every morning. Green Tea Extract OTC.      [name] oxyCODONE-acetaminophen (PERCOCET/ROXICET) 5-325 MG per tablet Take 1 tablet by mouth every 4 (four) hours as needed for pain.       . potassium chloride SA (K-DUR,KLOR-CON) 20 MEQ tablet Take 1 tablet (20 mEq total) by mouth 2 (two) times daily.  60 tablet  4  . sertraline (ZOLOFT) 50 MG tablet Take 50 mg by mouth every morning.      . zolpidem (AMBIEN) 10 MG tablet Take 10 mg by mouth at bedtime.        No current facility-administered medications for this visit.   Facility-Administered Medications Ordered in Other Visits  Medication Dose Route Frequency Provider Last Rate Last Dose  . sodium chloride 0.9 % injection 10 mL  10 mL Intracatheter PRN Lowella Dell, MD        OBJECTIVE: Middle-aged [place] American female who appears mildly uncomfortable but is in no acute distress Filed Vitals:   09/05/12 1123  BP: 126/86  Pulse: 76  Temp: 98.3 F (36.8 C)  Resp: 20     Body mass index is 40.47 kg/(m^2).    ECOG FS: 1 Filed Weights   09/05/12 1123  Weight: 200 lb 8 oz (90.946 kg)   Sclerae unicteric Oropharynx is benign, buccal mucosa pink and moist. No cervical or supraclavicular adenopathy Lungs clear to auscultation, no rales or rhonchi Heart regular rate and rhythm, no murmur Abdomen soft, obese, nontender, positive bowel sounds MSK no focal spinal tenderness to gentle palpation There is nonpitting edema in the left hand extending from the wrist to the fingertips. Joints in the  left hand are tender to touch. Nonpitting  edema bilaterally in the lower extremities, equal bilaterally. Neuro: nonfocal, well oriented  Breasts: Right breast is unremarkable. Left breast is status post lumpectomy and radiation. The left breast is tender to palpation. No suspicious nodularity or evidence of local recurrence. Axillae are benign bilaterally with no palpable adenopathy. Port has been removed from the right upper chest wall with well healed incision.   LAB RESULTS: Lab Results  Component Value Date   WBC 3.5* 09/05/2012   NEUTROABS 1.9 09/05/2012   HGB 10.8* 09/05/2012   HCT 32.1* 09/05/2012   MCV 88.2 09/05/2012   PLT 191 09/05/2012      Chemistry      Component Value Date/Time   NA 141 09/05/2012 1049   NA 140 07/24/2012 1435   K 3.9 09/05/2012 1049   K 4.0 07/24/2012 1435   CL 104 09/05/2012 1049   CL 103 07/24/2012 1435   CO2 26 09/05/2012 1049   CO2 29 07/24/2012 1435   BUN 16.1 09/05/2012 1049   BUN 19 07/24/2012 1435   CREATININE 1.2* 09/05/2012 1049   CREATININE 1.19* 07/24/2012 1435      Component Value Date/Time   CALCIUM 9.6 09/05/2012 1049   CALCIUM 9.8 07/24/2012 1435   ALKPHOS 78 09/05/2012 1049   ALKPHOS 76 10/20/2011 1324   AST 18 09/05/2012 1049   AST 22 10/20/2011 1324   ALT 17 09/05/2012 1049   ALT 20 10/20/2011 1324   BILITOT 0.24 09/05/2012 1049   BILITOT 0.3 10/20/2011 1324        STUDIES:  Most recent bilateral mammogram on 05/03/2012 was unremarkable.  Most recent bone density test on 05/03/2012 was normal.  A chest x-ray obtained on 07/24/2012 was unremarkable, with no acute disease.    ASSESSMENT: 52 y.o.  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   Biopsy of a left axillary node was also positive for cancer.  (2)   treated in the neoadjuvant setting and received 2 cycles of every 3 week docetaxel/carboplatin. This was discontinued after cycle 2 due to in tolerance with associated chest pain and significant PPE in both the upper and lower extremities.  (3)   Her treatment course was switched to Q3 week IV CMF/ trastuzumab, but the trastuzumab was held secondary to worsening left ventricular ejection fraction a history of congestive heart failure. (Echo on 06/15/2011 showed an EF between 35 and 40%.)   Essentially, the patient completed 4 q. three-week doses of  CMF, with last cycle given on 06/22/2011.   (4)  status post lumpectomy under the care of Dr. Magnus Ivan on 07/16/2011 with what node removal, final pathology showing a residual T2 N0 tumor. Residual tumor was again ER and PR positive and HER-2/neu positive.  (5)  Patient received radiation therapy under the care of Dr. Mitzi Hansen from June of 2013 until September of 2013.   (6)  She was then started on anastrozole at 1 mg daily in early November 2013.  (7)  multiple comorbidities including hypothyroidism, history of congestive heart failure, COPD, GERD, gout, and depression.   PLAN: With regards to her breast cancer, Maylie continues to do very well, with no clinical evidence of disease recurrence. I have refilled her anastrozole, which she will continue, the goal being to complete a  total of 5 years.  Urinalysis today was unremarkable with no evidence of urinary tract infection. I have recommended that when the call and make an appointment with her GYN for pelvic exam to evaluate for possible infection. I am also referring her to Dr. Lina Sar per her request for further evaluation of increased constipation, and consideration of a screening colonoscopy in a 52 year old patient.  Otherwise, Avanti will continue to be followed regularly by her primary care physician in [place], Dr. Lonie Peak.  She will return to see me in 6 months for repeat labs and  physical exam. At that time, we will also schedule her for her next mammogram which will be due in February 2015.  Arihana voices understanding and agreement with our plan.  She knows to call with any changes or problems prior to her next scheduled appointment.   Michalina Calbert    09/05/2012

## 2012-09-05 NOTE — Telephone Encounter (Signed)
appts made and printed...td 

## 2012-09-06 LAB — URINE CULTURE

## 2012-09-12 DIAGNOSIS — M109 Gout, unspecified: Secondary | ICD-10-CM | POA: Diagnosis not present

## 2012-09-12 DIAGNOSIS — M1990 Unspecified osteoarthritis, unspecified site: Secondary | ICD-10-CM | POA: Diagnosis not present

## 2012-09-12 DIAGNOSIS — E039 Hypothyroidism, unspecified: Secondary | ICD-10-CM | POA: Diagnosis not present

## 2012-09-19 DIAGNOSIS — K5732 Diverticulitis of large intestine without perforation or abscess without bleeding: Secondary | ICD-10-CM | POA: Diagnosis not present

## 2012-10-16 ENCOUNTER — Other Ambulatory Visit: Payer: Self-pay | Admitting: Oncology

## 2012-10-16 DIAGNOSIS — C50919 Malignant neoplasm of unspecified site of unspecified female breast: Secondary | ICD-10-CM

## 2012-10-17 ENCOUNTER — Other Ambulatory Visit: Payer: Self-pay | Admitting: Physician Assistant

## 2012-10-17 DIAGNOSIS — C50912 Malignant neoplasm of unspecified site of left female breast: Secondary | ICD-10-CM

## 2012-10-17 DIAGNOSIS — G4700 Insomnia, unspecified: Secondary | ICD-10-CM

## 2012-10-17 MED ORDER — TRAZODONE HCL 50 MG PO TABS: 50.0000 mg | ORAL_TABLET | Freq: Every day | ORAL | Status: DC

## 2012-10-31 ENCOUNTER — Other Ambulatory Visit: Payer: Self-pay | Admitting: Oncology

## 2012-10-31 DIAGNOSIS — C50912 Malignant neoplasm of unspecified site of left female breast: Secondary | ICD-10-CM

## 2012-11-08 ENCOUNTER — Encounter: Payer: Medicare Other | Admitting: Internal Medicine

## 2012-11-30 DIAGNOSIS — E039 Hypothyroidism, unspecified: Secondary | ICD-10-CM | POA: Diagnosis not present

## 2012-11-30 DIAGNOSIS — N289 Disorder of kidney and ureter, unspecified: Secondary | ICD-10-CM | POA: Diagnosis not present

## 2013-01-02 DIAGNOSIS — I8310 Varicose veins of unspecified lower extremity with inflammation: Secondary | ICD-10-CM | POA: Diagnosis not present

## 2013-01-02 DIAGNOSIS — I509 Heart failure, unspecified: Secondary | ICD-10-CM | POA: Diagnosis not present

## 2013-01-02 DIAGNOSIS — K5900 Constipation, unspecified: Secondary | ICD-10-CM | POA: Diagnosis not present

## 2013-01-02 DIAGNOSIS — Z79899 Other long term (current) drug therapy: Secondary | ICD-10-CM | POA: Diagnosis not present

## 2013-01-30 ENCOUNTER — Encounter (HOSPITAL_COMMUNITY): Payer: Self-pay | Admitting: Emergency Medicine

## 2013-01-30 ENCOUNTER — Emergency Department (HOSPITAL_COMMUNITY)
Admission: EM | Admit: 2013-01-30 | Discharge: 2013-01-30 | Disposition: A | Discharge status: Home / Self Care | Payer: Medicare Other | Source: Home / Self Care

## 2013-01-30 DIAGNOSIS — I890 Lymphedema, not elsewhere classified: Secondary | ICD-10-CM

## 2013-01-30 DIAGNOSIS — G8929 Other chronic pain: Secondary | ICD-10-CM

## 2013-01-30 DIAGNOSIS — M25511 Pain in right shoulder: Secondary | ICD-10-CM

## 2013-01-30 MED ORDER — ONDANSETRON 4 MG PO TBDP
ORAL_TABLET | ORAL | Status: AC
  Filled 2013-01-30: qty 1

## 2013-01-30 MED ORDER — HYDROMORPHONE HCL PF 1 MG/ML IJ SOLN
INTRAMUSCULAR | Status: AC
  Filled 2013-01-30: qty 2

## 2013-01-30 MED ORDER — HYDROMORPHONE HCL 1 MG/ML IJ SOLN
2.0000 mg | Freq: Once | INTRAMUSCULAR | Status: AC
  Administered 2013-01-30: 2 mg via INTRAMUSCULAR

## 2013-01-30 MED ORDER — ONDANSETRON 4 MG PO TBDP
4.0000 mg | ORAL_TABLET | Freq: Once | ORAL | Status: AC
  Administered 2013-01-30: 4 mg via ORAL

## 2013-01-30 NOTE — ED Notes (Signed)
Reports pain in both arms.  Left arm is swollen, right shoulder is painful.  C/o for several months.  Reports she has been seen by her physician in [place], dr Lonie Peak.

## 2013-01-30 NOTE — ED Provider Notes (Signed)
CSN: [id number]     Arrival date & time 01/30/13  1349 History   First MD Initiated Contact with Patient 01/30/13 1504     Chief Complaint  Patient presents with  . Arm Pain   (Consider location/radiation/quality/duration/timing/severity/associated sxs/prior Treatment) HPI Comments: 52 year old female complaining of bilateral arm pain. She has had swelling of the left upper arm for 2-3 months. She has seen her PCP in was diagnosed with gout. She has been treated with "gout medicine". This has not been helping. Her second complaint is that of right shoulder pain for which she has had for one month. Her narcotic medications are not helping her. She is taking Percocet and or Norco without relief. She is chronic back pain she has a history of left breast cancer for which she was treated with lymph node resection. She is crying with pain stating that nothing is working for her and that her physician had ordered some sort of imaging a month ago but she has not been called back in regards to that.   Past Medical History  Diagnosis Date  . Depression   . Hypothyroidism   . Congestive heart failure   . Emphysema of lung   . GERD (gastroesophageal reflux disease)   . Heart murmur   . Hypertension   . Anemia 05/19/2011  . Nonischemic cardiomyopathy     Cardiologist Dr. Armanda Magic  . Pneumonia   . History of bronchitis   . Shortness of breath 07/16/11    "at all times"  . Blood transfusion   . Chronic lower back pain   . Breast cancer 07/16/2011    left breast lumpectomy=invasive ductal ca,2. cm,high grade ductal ca in situ,11 benign lymph nodes  . Obesity   . Allergy   . Neuromuscular disorder     tingling left hand  . Myocardial infarction 2003  . Hyperlipidemia   . History of radiation therapy 09/06/11-11/24/11    left breast  60.4gray total dose   Past Surgical History  Procedure Laterality Date  . Portacath placement  02/26/2011    Procedure: INSERTION PORT-A-CATH;  Surgeon: Shelly Rubenstein, MD;  Location: [facility] OR;  Service: General;  Laterality: Right;  . Breast biopsy      left breast  . Breast lumpectomy  07/16/11    w/LND; left.ER/PR=positive  . Abdominal hysterectomy  ~2006    partial  . Tubal ligation  1991  . Port-a-cath removal N/A 07/31/2012    Procedure: REMOVAL PORT-A-CATH;  Surgeon: Shelly Rubenstein, MD;  Location: [facility] ORS;  Service: General;  Laterality: N/A;   Family History  Problem Relation Age of Onset  . Leukemia Mother   . Heart disease Maternal Grandmother    History  Substance Use Topics  . Smoking status: Former Smoker -- 1.00 packs/day for 40 years    Types: Cigarettes    Quit date: 02/20/2011  . Smokeless tobacco: Never Used  . Alcohol Use: 0.0 oz/week     Comment: 07/16/11 "wine coolers; eve   OB History   Grav Para Term Preterm Abortions TAB SAB Ect Mult Living                 Review of Systems  Constitutional: Positive for activity change. Negative for fever.  HENT: Negative.   Respiratory: Negative for cough and shortness of breath.   Cardiovascular: Positive for leg swelling. Negative for chest pain.  Gastrointestinal: Negative.   Musculoskeletal: Positive for arthralgias.  Neurological: Negative.   Psychiatric/Behavioral: Positive for  agitation.    Allergies  Aspirin; Docetaxel; Morphine and related; and Penicillins  Home Medications   Current Outpatient Rx  Name  Route  Sig  Dispense  Refill  . anastrozole (ARIMIDEX) 1 MG tablet   Oral   Take 1 tablet (1 mg total) by mouth daily.   90 tablet   3   . carvedilol (COREG) 25 MG tablet   Oral   Take 25 mg by mouth 2 (two) times daily with a meal.          . furosemide (LASIX) 40 MG tablet   Oral   Take 80-120 mg by mouth daily. 120 mg in the am and 80 mg in the pm.         . HYDROcodone-acetaminophen (VICODIN) 5-500 MG per tablet   Oral   Take 1 tablet by mouth every 6 (six) hours as needed.   30 tablet   1   . levothyroxine (SYNTHROID, LEVOTHROID) 75  MCG tablet   Oral   Take 75 mcg by mouth every morning.         [name] lisinopril (PRINIVIL,ZESTRIL) 20 MG tablet   Oral   Take 20 mg by mouth every morning.          . Melatonin 10 MG TABS   Oral   Take 3 tablets by mouth at bedtime.         . Menthol, Topical Analgesic, (ZIMS MAX-FREEZE) 3.7 % GEL   Apply externally   Apply 1 application topically daily as needed (for pain). Apply to back and legs         . mupirocin ointment (BACTROBAN) 2 %      APPLY TOPICALLY TO AFFECTED AREA AS DIRECTED   30 g   4     Dr. Pierce Crane is no longer with this practice.   ...   . nicotine (NICODERM CQ - DOSED IN MG/24 HOURS) 21 mg/24hr patch   Transdermal   Place 1 patch onto the skin daily.         . nitroGLYCERIN (NITROSTAT) 0.4 MG SL tablet   Sublingual   Place 0.4 mg under the tongue every 5 (five) minutes as needed. As needed for chest pain.         [name] omeprazole (PRILOSEC) 20 MG capsule   Oral   Take 20 mg by mouth 2 (two) times daily.          [name] OVER THE COUNTER MEDICATION   Oral   Take 3 capsules by mouth every morning. Green Tea Extract OTC.         [name] oxyCODONE-acetaminophen (PERCOCET/ROXICET) 5-325 MG per tablet   Oral   Take 1 tablet by mouth every 4 (four) hours as needed for pain.          . potassium chloride SA (K-DUR,KLOR-CON) 20 MEQ tablet   Oral   Take 1 tablet (20 mEq total) by mouth 2 (two) times daily.   60 tablet   4   . sertraline (ZOLOFT) 50 MG tablet   Oral   Take 50 mg by mouth every morning.         . traZODone (DESYREL) 50 MG tablet      TAKE ONE TABLET BY MOUTH ONCE DAILY AT BEDTIME   30 tablet   3   . zolpidem (AMBIEN) 10 MG tablet   Oral   Take 10 mg by mouth at bedtime.           BP  131/79  Pulse 62  Temp(Src) 98.7 F (37.1 C) (Oral)  Resp 16  SpO2 99% Physical Exam  Constitutional: She is oriented to person, place, and time. She appears well-developed and well-nourished. No distress.  HENT:  Head:  Normocephalic and atraumatic.  Eyes: EOM are normal. Pupils are equal, round, and reactive to light.  Neck: Normal range of motion. Neck supple.  Cardiovascular: Normal rate and normal heart sounds.   Pulmonary/Chest: Effort normal and breath sounds normal.  Musculoskeletal:  There is uniform edema to the left upper extremity. Mild pitting to the wrist. Radial pulses are weak 1+. Capillary refill is less than 3 seconds. The hand is warm with good color. Strength is 5 over 5. No evidence of arterial occlusion. She is able to flex and extend the elbow pronate and supinate normally. Her no specific areas of localized tenderness.  The right shoulder has near full range of motion. She can abduct over her head greater than 180. Distal neurovascular motor sensory is intact. Location of the pain appears to be in the right anterior upper chest and shoulder. There is tenderness in this area. No tenderness to the bridge of the trapezius, a.c. joint or posterior shoulder or deltoid. It is worse with movement and lying on her left side at night. No evidence of discoloration, deformity, or swelling.  No bony tenderness to the arm or shoulder.  Lymphadenopathy:    She has no cervical adenopathy.  Neurological: She is alert and oriented to person, place, and time. No cranial nerve deficit.  Skin: Skin is warm and dry.    ED Course  Procedures (including critical care time) Labs Review Labs Reviewed - No data to display Imaging Review No results found.    MDM   1. Lymphedema of arm   2. Shoulder pain, acute, right   3. Chronic pain      F/U with your surgeon ASAP for Right arm lymphedema Cal  Your PCP office reference severe shoulder pain and status of imaging Dilaudid 2mg  IM now She has a hx of narcotic use over many yrs and has a tolerence at this point. IS tqking 2 percocets and or norco without relief.  Am unable to prescribe stronger pain meds, mest see her primary.  Hayden Rasmussen,  NP 01/30/13 1554  1600. I was notified she was in the bathroom with vomiting and diarrhea. These are new sx's. Zofran 4mg  po. She declined injection.   Hayden Rasmussen, NP 01/30/13 [phone]

## 2013-01-30 NOTE — ED Notes (Signed)
Daughter is driving patient

## 2013-02-01 NOTE — ED Provider Notes (Signed)
Medical screening examination/treatment/procedure(s) were performed by resident physician or non-physician practitioner and as supervising physician I was immediately available for consultation/collaboration.   [name] DOUGLAS MD.   Ellissa Ayo D Quaid Yeakle, MD 02/01/13 1919 

## 2013-02-05 DIAGNOSIS — I890 Lymphedema, not elsewhere classified: Secondary | ICD-10-CM | POA: Diagnosis not present

## 2013-02-05 DIAGNOSIS — E039 Hypothyroidism, unspecified: Secondary | ICD-10-CM | POA: Diagnosis not present

## 2013-02-05 DIAGNOSIS — Z79899 Other long term (current) drug therapy: Secondary | ICD-10-CM | POA: Diagnosis not present

## 2013-02-05 DIAGNOSIS — M25519 Pain in unspecified shoulder: Secondary | ICD-10-CM | POA: Diagnosis not present

## 2013-03-05 ENCOUNTER — Other Ambulatory Visit: Payer: Medicare Other | Admitting: Lab

## 2013-03-05 ENCOUNTER — Ambulatory Visit (HOSPITAL_BASED_OUTPATIENT_CLINIC_OR_DEPARTMENT_OTHER): Payer: Medicare Other | Admitting: Physician Assistant

## 2013-03-05 ENCOUNTER — Ambulatory Visit (HOSPITAL_COMMUNITY)
Admission: RE | Admit: 2013-03-05 | Discharge: 2013-03-05 | Disposition: A | Discharge status: Home / Self Care | Payer: Medicare Other | Source: Ambulatory Visit | Attending: Physician Assistant | Admitting: Physician Assistant

## 2013-03-05 ENCOUNTER — Encounter: Payer: Self-pay | Admitting: Physician Assistant

## 2013-03-05 ENCOUNTER — Other Ambulatory Visit: Payer: Self-pay | Admitting: Oncology

## 2013-03-05 ENCOUNTER — Telehealth: Payer: Self-pay | Admitting: *Deleted

## 2013-03-05 ENCOUNTER — Ambulatory Visit (HOSPITAL_BASED_OUTPATIENT_CLINIC_OR_DEPARTMENT_OTHER): Payer: Medicare Other

## 2013-03-05 DIAGNOSIS — R0602 Shortness of breath: Secondary | ICD-10-CM | POA: Diagnosis not present

## 2013-03-05 DIAGNOSIS — D649 Anemia, unspecified: Secondary | ICD-10-CM

## 2013-03-05 DIAGNOSIS — R0609 Other forms of dyspnea: Secondary | ICD-10-CM | POA: Diagnosis not present

## 2013-03-05 DIAGNOSIS — M79609 Pain in unspecified limb: Secondary | ICD-10-CM | POA: Insufficient documentation

## 2013-03-05 DIAGNOSIS — C50919 Malignant neoplasm of unspecified site of unspecified female breast: Secondary | ICD-10-CM | POA: Diagnosis not present

## 2013-03-05 DIAGNOSIS — Z923 Personal history of irradiation: Secondary | ICD-10-CM | POA: Diagnosis not present

## 2013-03-05 DIAGNOSIS — I509 Heart failure, unspecified: Secondary | ICD-10-CM

## 2013-03-05 DIAGNOSIS — R600 Localized edema: Secondary | ICD-10-CM

## 2013-03-05 DIAGNOSIS — C773 Secondary and unspecified malignant neoplasm of axilla and upper limb lymph nodes: Secondary | ICD-10-CM

## 2013-03-05 DIAGNOSIS — F329 Major depressive disorder, single episode, unspecified: Secondary | ICD-10-CM | POA: Diagnosis not present

## 2013-03-05 DIAGNOSIS — I890 Lymphedema, not elsewhere classified: Secondary | ICD-10-CM | POA: Diagnosis not present

## 2013-03-05 DIAGNOSIS — C50412 Malignant neoplasm of upper-outer quadrant of left female breast: Secondary | ICD-10-CM

## 2013-03-05 DIAGNOSIS — J449 Chronic obstructive pulmonary disease, unspecified: Secondary | ICD-10-CM | POA: Diagnosis not present

## 2013-03-05 DIAGNOSIS — K219 Gastro-esophageal reflux disease without esophagitis: Secondary | ICD-10-CM | POA: Diagnosis not present

## 2013-03-05 DIAGNOSIS — M7989 Other specified soft tissue disorders: Secondary | ICD-10-CM | POA: Insufficient documentation

## 2013-03-05 DIAGNOSIS — R609 Edema, unspecified: Secondary | ICD-10-CM | POA: Diagnosis not present

## 2013-03-05 DIAGNOSIS — R7989 Other specified abnormal findings of blood chemistry: Secondary | ICD-10-CM

## 2013-03-05 DIAGNOSIS — M25519 Pain in unspecified shoulder: Secondary | ICD-10-CM | POA: Diagnosis not present

## 2013-03-05 DIAGNOSIS — Z9221 Personal history of antineoplastic chemotherapy: Secondary | ICD-10-CM | POA: Diagnosis not present

## 2013-03-05 DIAGNOSIS — R918 Other nonspecific abnormal finding of lung field: Secondary | ICD-10-CM | POA: Diagnosis not present

## 2013-03-05 DIAGNOSIS — Z853 Personal history of malignant neoplasm of breast: Secondary | ICD-10-CM | POA: Diagnosis not present

## 2013-03-05 LAB — COMPREHENSIVE METABOLIC PANEL (CC13)
ALT: 21 U/L (ref 0–55)
AST: 19 U/L (ref 5–34)
Alkaline Phosphatase: 71 U/L (ref 40–150)
CO2: 27 mEq/L (ref 22–29)
Chloride: 104 mEq/L (ref 98–109)
Sodium: 144 mEq/L (ref 136–145)
Total Bilirubin: 0.2 mg/dL (ref 0.20–1.20)
Total Protein: 7.6 g/dL (ref 6.4–8.3)

## 2013-03-05 LAB — CBC WITH DIFFERENTIAL/PLATELET
BASO%: 0.3 % (ref 0.0–2.0)
EOS%: 0.2 % (ref 0.0–7.0)
HCT: 33.8 % — ABNORMAL LOW (ref 34.8–46.6)
LYMPH%: 36 % (ref 14.0–49.7)
MCHC: 33.2 g/dL (ref 31.5–36.0)
MCV: 85.9 fL (ref 79.5–101.0)
MONO#: 0.4 10*3/uL (ref 0.1–0.9)
MONO%: 9.1 % (ref 0.0–14.0)
Platelets: 222 10*3/uL (ref 145–400)
RBC: 3.94 10*6/uL (ref 3.70–5.45)
RDW: 14.9 % — ABNORMAL HIGH (ref 11.2–14.5)
WBC: 3.9 10*3/uL (ref 3.9–10.3)

## 2013-03-05 NOTE — Progress Notes (Signed)
*  Preliminary Results* Left upper extremity venous duplex completed. Left upper extremity is negative for deep and superficial vein thrombosis.  Preliminary results discussed with Zollie Scale, PA.  03/05/2013 4:26 PM  Gertie Fey, RVT, RDCS, RDMS

## 2013-03-05 NOTE — Telephone Encounter (Signed)
appts made and printed...td 

## 2013-03-05 NOTE — Progress Notes (Signed)
ID: Sunday Corn   DOB: [birth year]  MR#: [id number]  WUJ#:[id number]  PCP: Arlyss Queen GYN:  SU: Abigail Miyamoto, MD OTHER MD:  Dorothy Puffer, MD  CHIEF COMPLAINT:  Left Breast Cancer   HISTORY OF PRESENT ILLNESS: [name] noted a change in her left breast in October 2012. Dr. Anna Genre set her up for mammography at the [facility] 01/29/2011, and this showed an irregular high-density mass with ill-defined margins in the upper outer quadrant of the left breast. This was palpable and mobile. Ultrasound showed an irregular hypoechoic mass with lobulated margins measuring up to 2.9 cm. One left axillary lymph node was noted to be enlarged at 2.6 cm. There were no other abnormal lymph nodes noted.   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  With this information the patient was set up for bilateral breast MRIs 02/05/2011 showing in the left breast mass in question to measure up to 4.9 cm. There were multiple small nodules without fatty hila in the left axilla, suspicious for metastatic disease there. The right side was unremarkable and there was no suspicious internal mammary adenopathy.   On 02/08/2011 the patient underwent biopsy of the largest left axillary lymph node, and this was positive([id number]).  Patient was treated in the neoadjuvant setting and received 2 cycles of every 3 week docetaxel/carboplatin. This was discontinued after cycle 2 due to in tolerance with associated chest pain and significant PPE in both the upper and lower extremities. Her treatment course was switched to Q3 week IV CMF/ trastuzumab, but the trastuzumab was held secondary to worsening left ventricular ejection fraction a history of congestive heart failure. (Echo on 06/15/2011  showed an EF between 35 and 40%.)   Essentially, the patient completed 4 q. three-week doses of  CMF, with last cycle given on 06/22/2011.   She is status post lumpectomy under the care of Dr. Magnus Ivan on 07/16/2011 with what node removal, final pathology showing a residual T2 N0 tumor. Residual tumor was again ER and PR positive and HER-2/neu positive.  Patient received radiation therapy under the care of Dr. Mitzi Hansen from June of 2013 until September of 2013. She was then started on anastrozole at 1 mg daily in early November 2013.  Additional history is as detailed below.   INTERVAL HISTORY: [name] returns today for routine followup of her left breast carcinoma. She continues on anastrozole with good tolerance. The hot flashes have improved somewhat. She denies any current problems with vaginal dryness.  [name]'s biggest concern today is the fact that she has had severe swelling in her left arm for approximately one month. She tells me this is been evaluated on multiple occasions by her primary care physician, and just recently she was started on clarithromycin, first dose taken on 02/27/2013. She is seeing no improvement in the swelling or pain. She has also sought evaluation at the emergency room and was given to walk up for pain, but apparently there were no labs or imaging studies.  This is significantly affecting her quality of life and has decreased her functional status due to the swelling and pain.   REVIEW OF SYSTEMS: [name] has had no recent illnesses and denies fevers or chills. She's had no skin changes or rashes and denies abnormal bruising or bleeding. She's  eating and drinking well.  She has no nausea or emesis or change in bowel or bladder habits.   She does have some problems with reflux.  She continues to feel fatigued, and has shortness of breath with exertion. She does think the shortness of breath has increased slightly, and occasionally she feels short of breath at rest as well as  with exertion. She denies any new cough, phlegm production, chest pain, palpitations. She has some swelling in her feet and ankles which is stable. No abnormal headaches or dizziness. She does have some blurred vision. She feels somewhat forgetful, and also feels depressed although she denies suicidal ideation. Primarily, she thinks the depression is coming from the frustration associated with the swelling in the left arm.  A detailed review of systems is otherwise stable and noncontributory.   PAST MEDICAL HISTORY: Past Medical History  Diagnosis Date  . Depression   . Hypothyroidism   . Congestive heart failure   . Emphysema of lung   . GERD (gastroesophageal reflux disease)   . Heart murmur   . Hypertension   . Anemia 05/19/2011  . Nonischemic cardiomyopathy     Cardiologist Dr. Armanda Magic  . Pneumonia   . History of bronchitis   . Shortness of breath 07/16/11    "at all times"  . Blood transfusion   . Chronic lower back pain   . Breast cancer 07/16/2011    left breast lumpectomy=invasive ductal ca,2. cm,high grade ductal ca in situ,11 benign lymph nodes  . Obesity   . Allergy   . Neuromuscular disorder     tingling left hand  . Myocardial infarction 2003  . Hyperlipidemia   . History of radiation therapy 09/06/11-11/24/11    left breast  60.4gray total dose    PAST SURGICAL HISTORY: Past Surgical History  Procedure Laterality Date  . Portacath placement  02/26/2011    Procedure: INSERTION PORT-A-CATH;  Surgeon: Shelly Rubenstein, MD;  Location: [facility] OR;  Service: General;  Laterality: Right;  . Breast biopsy      left breast  . Breast lumpectomy  07/16/11    w/LND; left.ER/PR=positive  . Abdominal hysterectomy  ~2006    partial  . Tubal ligation  1991  . Port-a-cath removal N/A 07/31/2012    Procedure: REMOVAL PORT-A-CATH;  Surgeon: Shelly Rubenstein, MD;  Location: [facility] ORS;  Service: General;  Laterality: N/A;    FAMILY HISTORY Family History  Problem Relation Age  of Onset  . Leukemia Mother   . Heart disease Maternal Grandmother   The patient's father died at the age of 45 from renal failure. The patient's mother died at the age of 27 from acute leukemia. The patient has 2 brothers and 2 sisters, there is no history of breast or ovarian cancer in the family to her knowledge.   GYNECOLOGIC HISTORY: Menarche age 39, menopause 2005, the patient never took hormone replacement. She is GX P3, first pregnancy to term age 79.  S/p TAH with right salpingo-oophorectomy remotely.  SOCIAL HISTORY:  (Updated December 2014) The patient is currently unemployed. She is divorced and lives by herself. Her children are [name], lives in [place] and works for the post office, she is 51 years old; [name], 26 a lives in [place] and works for [employer], and [name], 21, who lives in [place] and works in daycare. The patient has 5 grandchildren.  She attends the [employer] in [place].     ADVANCED DIRECTIVES:  (  Updated 03/05/2013)   Blue tells me her friend, [name], who can be reached at [phone], would be her healthcare power of attorney. (I do not have a copy of that paperwork.)  HEALTH MAINTENANCE:  (Updated December 2014) History  Substance Use Topics  . Smoking status: Former Smoker -- 1.00 packs/day for 40 years    Types: Cigarettes    Quit date: 02/20/2011  . Smokeless tobacco: Never Used  . Alcohol Use: 0.0 oz/week     Comment: 07/16/11 "wine coolers; eve     Colonoscopy: never  PAP: not since hysterectomy  Bone density: Feb 2014, Normal  Lipid panel: UTD, Dr. Anna Genre  Allergies  Allergen Reactions  . Aspirin Hives  . Docetaxel Other (See Comments)    Chest tightness and pain  . Morphine And Related Hives and Swelling  . Penicillins Hives    Current Outpatient Prescriptions  Medication Sig Dispense Refill  . anastrozole (ARIMIDEX) 1 MG tablet Take 1 tablet (1 mg total) by mouth daily.  90  tablet  3  . carvedilol (COREG) 25 MG tablet Take 25 mg by mouth 2 (two) times daily with a meal.       . clarithromycin (BIAXIN) 500 MG tablet       . furosemide (LASIX) 80 MG tablet 80 mg 3 (three) times daily.       [name] levothyroxine (SYNTHROID, LEVOTHROID) 75 MCG tablet Take 75 mcg by mouth every morning.      [name] lisinopril (PRINIVIL,ZESTRIL) 20 MG tablet Take 20 mg by mouth every morning.       . lubiprostone (AMITIZA) 24 MCG capsule       . Melatonin 10 MG TABS Take 3 tablets by mouth at bedtime.      . Menthol, Topical Analgesic, (ZIMS MAX-FREEZE) 3.7 % GEL Apply 1 application topically daily as needed (for pain). Apply to back and legs      . mupirocin ointment (BACTROBAN) 2 % APPLY TOPICALLY TO AFFECTED AREA AS DIRECTED  30 g  4  . nicotine (NICODERM CQ - DOSED IN MG/24 HOURS) 21 mg/24hr patch Place 1 patch onto the skin daily.      . nitroGLYCERIN (NITROSTAT) 0.4 MG SL tablet Place 0.4 mg under the tongue every 5 (five) minutes as needed. As needed for chest pain.      [name] NITROSTAT 0.4 MG SL tablet       . omeprazole (PRILOSEC) 20 MG capsule Take 20 mg by mouth 2 (two) times daily.       [name] OVER THE COUNTER MEDICATION Take 3 capsules by mouth every morning. Green Tea Extract OTC.      [name] oxyCODONE-acetaminophen (PERCOCET/ROXICET) 5-325 MG per tablet Take 1 tablet by mouth every 4 (four) hours as needed for pain.       . potassium chloride SA (K-DUR,KLOR-CON) 20 MEQ tablet Take 1 tablet (20 mEq total) by mouth 2 (two) times daily.  60 tablet  4  . probenecid (BENEMID) 500 MG tablet       . sertraline (ZOLOFT) 50 MG tablet Take 50 mg by mouth every morning.      . traZODone (DESYREL) 50 MG tablet TAKE ONE TABLET BY MOUTH ONCE DAILY AT BEDTIME  30 tablet  3  . zolpidem (AMBIEN) 10 MG tablet Take 10 mg by mouth at bedtime.       . cyclobenzaprine (FLEXERIL) 5 MG tablet        No current facility-administered medications for this visit.   Facility-Administered  Medications Ordered in Other  Visits  Medication Dose Route Frequency Provider Last Rate Last Dose  . sodium chloride 0.9 % injection 10 mL  10 mL Intracatheter PRN Lowella Dell, MD        OBJECTIVE: Middle-aged [place] American female who appears uncomfortable but is in no acute distress Filed Vitals:   03/05/13 1334  BP: 123/81  Pulse: 76  Temp: 98.7 F (37.1 C)  Resp: 20     Body mass index is 40.84 kg/(m^2).    ECOG FS: 1 Filed Weights   03/05/13 1334  Weight: 202 lb 4.8 oz (91.763 kg)  Physical Exam: HEENT:  Sclerae anicteric.  Oropharynx clear and moist. Neck is supple. NODES:  No cervical or supraclavicular lymphadenopathy palpated.  BREAST EXAM:  Right breast is unremarkable. Left breast is status post lumpectomy and radiation. The breast is tender to palpation, but there no suspicious nodularities, and no evidence of local recurrence. Axillae are benign bilaterally, no palpable lymphadenopathy. Port has been removed from the right upper chest wall, with well-healed incision and no evidence of infection. LUNGS:  Clear to auscultation bilaterally with fair excursion.  No wheezes or rhonchi HEART:  Regular rate and rhythm.  ABDOMEN:  Soft, obese, nontender.  Positive bowel sounds.  MSK:  No focal spinal tenderness to palpation. Limited range of motion in the left upper extremity secondary to edema. Good range of motion in the right upper extremity. EXTREMITIES:  2+ pitting edema in the left upper extremity. Right upper extremity is benign. Nonpitting edema bilaterally in the lower extremities, equal bilaterally with no erythema noted. SKIN:  Benign.  No visible rashes or skin changes. No nail dyscrasia. No significant erythema noted in the left upper extremity, and no evidence of cellulitis. NEURO:  Nonfocal. Well oriented.  Frustrated affect.    LAB RESULTS: Lab Results  Component Value Date   WBC 3.9 03/05/2013   NEUTROABS 2.1 03/05/2013   HGB 11.2* 03/05/2013   HCT 33.8* 03/05/2013   MCV 85.9  03/05/2013   PLT 222 03/05/2013      Chemistry      Component Value Date/Time   NA 144 03/05/2013 1420   NA 140 07/24/2012 1435   K 3.7 03/05/2013 1420   K 4.0 07/24/2012 1435   CL 104 09/05/2012 1049   CL 103 07/24/2012 1435   CO2 27 03/05/2013 1420   CO2 29 07/24/2012 1435   BUN 19.2 03/05/2013 1420   BUN 19 07/24/2012 1435   CREATININE 1.5* 03/05/2013 1420   CREATININE 1.19* 07/24/2012 1435      Component Value Date/Time   CALCIUM 10.1 03/05/2013 1420   CALCIUM 9.8 07/24/2012 1435   ALKPHOS 71 03/05/2013 1420   ALKPHOS 76 10/20/2011 1324   AST 19 03/05/2013 1420   AST 22 10/20/2011 1324   ALT 21 03/05/2013 1420   ALT 20 10/20/2011 1324   BILITOT 0.20 03/05/2013 1420   BILITOT 0.3 10/20/2011 1324        STUDIES:  Most recent bilateral mammogram on 05/03/2012 was unremarkable.  Most recent bone density test on 05/03/2012 was normal.  A chest x-ray obtained on 07/24/2012 was unremarkable, with no acute disease.  Ct Chest Wo Contrast  03/05/2013   CLINICAL DATA:  History of breast cancer post XRT and chemotherapy, now with acute left upper extremity edema and shortness of breath, evaluate for disease recurrence  EXAM: CT CHEST WITHOUT CONTRAST  TECHNIQUE: Multidetector CT imaging of the chest was performed following the standard  protocol without IV contrast.  COMPARISON:  Chest CT - 02/24/2011; PET-CT -02/24/2011 ; chest radiograph -07/24/2012  FINDINGS: There are very minimal asymmetry subpleural reticular opacities within the peripheral aspect of the left upper lobe and lingula, possibly the sequela of provided history of prior radiation therapy. No focal airspace opacities. No pleural effusion or pneumothorax. No discrete pulmonary nodules. The central pulmonary airways are widely patent.  Scattered shotty mediastinal lymph nodes are individually not enlarged by size criteria with index AP window lymph node measuring 0.5 cm in greatest short axis diameter (image 21, series 2, not  enlarged by size criteria and grossly unchanged since the 2012 examination. No definite hilar adenopathy on this noncontrast examination. Interval resolution of previously noted left axillary lymphadenopathy.  Limited noncontrast evaluation of the upper abdomen is normal.  Interval removal of previously noted right subclavian vein approach port a catheter.  There are surgical clips within the upper outer quadrant of the left breast as well as the left axilla compatible with provided history of breast cancer.  Regional soft tissues are otherwise normal. No acute or aggressive osseous lesions.  IMPRESSION: 1. No acute cardiopulmonary disease on this noncontrast examination. Specifically, No definite evidence metastatic disease to the chest. If concern remains for acute left upper extremity edema, further evaluation could be performed with left upper extremity venous Doppler ultrasound as clinically indicated. 2. Postoperative change of the left breast and left axilla compatible with provided history of breast cancer.   Electronically Signed   By: Simonne Come M.D.   On: 03/05/2013 16:05    ASSESSMENT: 53 y.o.  [facility] woman  (1) status post left breast biopsy on November 2012 for an invasive ductal carcinoma, grade 2, which was estrogen receptor positive at 99%, progesterone receptor weakly positive at 2%, with an MIB-1 of 61%, and HER-2 amplification by CISH with a ratio of 2.87.   Biopsy of a left axillary node was also positive for cancer.  (2)  treated in the neoadjuvant setting and received 2 cycles of every 3 week docetaxel/carboplatin. This was discontinued after cycle 2 due to in tolerance with associated chest pain and significant PPE in both the upper and lower extremities.  (3)   Her treatment course was switched to Q3 week IV CMF/ trastuzumab, but the trastuzumab was held secondary to worsening left ventricular ejection fraction a history of congestive heart failure. (Echo on 06/15/2011 showed  an EF between 35 and 40%.)   Essentially, the patient completed 4 q. three-week doses of  CMF, with last cycle given on 06/22/2011.   (4)  status post lumpectomy under the care of Dr. Magnus Ivan on 07/16/2011 with what node removal, final pathology showing a residual T2 N0 tumor. Residual tumor was again ER and PR positive and HER-2/neu positive.  (5)  Patient received radiation therapy under the care of Dr. Mitzi Hansen from June of 2013 until September of 2013.   (6)  She was then started on anastrozole at 1 mg daily in early November 2013.  (7)  multiple comorbidities including hypothyroidism, history of congestive heart failure, COPD, GERD, gout, and depression.  (8)  severe swelling in the left upper extremity, worsening since November 2014   PLAN: I am concerned about the amount of swelling Brenna is having in the left upper extremity, and obviously this is affecting her functional status. We obtained a chest CT which is detailed above, and fortunately shows no evidence of metastatic disease. (Of course this was done without contrast due  to an elevated serum creatinine today of 1.5 which is a chronic issue.)   We also obtained a Doppler study of the left upper extremity which showed no evidence of DVT.   I have reviewed all of the above results with [name] today, including her lab results, her chest CT, and her Doppler.  At this point, it seems as if the swelling is simply going to be severe lymphedema status post removal of 11 lymph nodes during her surgery in April 2013.  Per review with Dr. Darnelle Catalan, we are referring Reilyn to the lymphedema clinic for urgent evaluation, possibly for wrapping of the left upper extremity. I will plan on seeing her back in approximately 4 weeks to reassess the swelling, but she will call prior that time with any additional problems.  Overall, our plan is to continue on anastrozole for a total of 5 years, until November 2018. Her next bilateral mammogram will be due  in February 2015.   Jobeth Pangilinan PA-C    03/05/2013

## 2013-03-07 ENCOUNTER — Ambulatory Visit: Payer: Medicare Other | Attending: Physician Assistant | Admitting: Physical Therapy

## 2013-03-07 DIAGNOSIS — I890 Lymphedema, not elsewhere classified: Secondary | ICD-10-CM | POA: Insufficient documentation

## 2013-03-07 DIAGNOSIS — M25519 Pain in unspecified shoulder: Secondary | ICD-10-CM | POA: Insufficient documentation

## 2013-03-07 DIAGNOSIS — IMO0001 Reserved for inherently not codable concepts without codable children: Secondary | ICD-10-CM | POA: Diagnosis not present

## 2013-03-08 ENCOUNTER — Encounter: Payer: Medicare Other | Admitting: Physical Therapy

## 2013-03-12 ENCOUNTER — Ambulatory Visit: Payer: Medicare Other | Admitting: Physical Therapy

## 2013-03-19 ENCOUNTER — Ambulatory Visit: Payer: Medicare Other | Admitting: Physical Therapy

## 2013-03-20 ENCOUNTER — Encounter: Payer: Medicare Other | Admitting: Physical Therapy

## 2013-03-30 ENCOUNTER — Telehealth: Payer: Self-pay | Admitting: Oncology

## 2013-03-30 ENCOUNTER — Other Ambulatory Visit: Payer: Self-pay | Admitting: Physician Assistant

## 2013-03-30 DIAGNOSIS — C50419 Malignant neoplasm of upper-outer quadrant of unspecified female breast: Secondary | ICD-10-CM

## 2013-03-30 NOTE — Progress Notes (Signed)
Patient contacted the office today and would like to put off her next followup appointment from January to later in the spring. She tells [place] she is doing well. The swelling she was previously assessed for is improving and she has had successful treatment with wrapping at the lymphedema clinic.  Typically, we would see this patient every 3-6 months. Accordingly, we are rescheduling her lab and visit for mid April 2015.  Micah Flesher, PA-C 03/30/2013

## 2013-03-30 NOTE — Telephone Encounter (Signed)
, °

## 2013-04-02 ENCOUNTER — Telehealth: Payer: Self-pay | Admitting: Oncology

## 2013-04-02 NOTE — Telephone Encounter (Signed)
per 1/9 pof moved 1/20 appt to mid april. lmonvm for pt and mailed schedule.

## 2013-04-03 ENCOUNTER — Ambulatory Visit: Payer: Medicare Other | Admitting: Physician Assistant

## 2013-04-03 ENCOUNTER — Other Ambulatory Visit: Payer: Medicare Other

## 2013-04-10 ENCOUNTER — Ambulatory Visit: Payer: Medicare Other | Admitting: Physician Assistant

## 2013-04-10 ENCOUNTER — Other Ambulatory Visit: Payer: Medicare Other

## 2013-04-12 ENCOUNTER — Telehealth: Payer: Self-pay | Admitting: *Deleted

## 2013-04-12 NOTE — Telephone Encounter (Signed)
Returned pt's call. Pt called because she is looking for a primary care physician. I suggested [facility] [employer] [facility]. Pt agreed and said thank you.

## 2013-04-25 DIAGNOSIS — I10 Essential (primary) hypertension: Secondary | ICD-10-CM | POA: Diagnosis not present

## 2013-04-25 DIAGNOSIS — I890 Lymphedema, not elsewhere classified: Secondary | ICD-10-CM | POA: Diagnosis not present

## 2013-04-25 DIAGNOSIS — M545 Low back pain, unspecified: Secondary | ICD-10-CM | POA: Diagnosis not present

## 2013-04-25 DIAGNOSIS — I509 Heart failure, unspecified: Secondary | ICD-10-CM | POA: Diagnosis not present

## 2013-05-18 ENCOUNTER — Ambulatory Visit: Payer: Medicare Other | Admitting: Cardiology

## 2013-05-26 ENCOUNTER — Other Ambulatory Visit: Payer: Self-pay | Admitting: Physician Assistant

## 2013-05-30 DIAGNOSIS — I972 Postmastectomy lymphedema syndrome: Secondary | ICD-10-CM | POA: Diagnosis not present

## 2013-05-30 DIAGNOSIS — IMO0001 Reserved for inherently not codable concepts without codable children: Secondary | ICD-10-CM | POA: Diagnosis not present

## 2013-06-04 DIAGNOSIS — IMO0001 Reserved for inherently not codable concepts without codable children: Secondary | ICD-10-CM | POA: Diagnosis not present

## 2013-06-04 DIAGNOSIS — I972 Postmastectomy lymphedema syndrome: Secondary | ICD-10-CM | POA: Diagnosis not present

## 2013-06-06 DIAGNOSIS — IMO0001 Reserved for inherently not codable concepts without codable children: Secondary | ICD-10-CM | POA: Diagnosis not present

## 2013-06-06 DIAGNOSIS — I972 Postmastectomy lymphedema syndrome: Secondary | ICD-10-CM | POA: Diagnosis not present

## 2013-06-12 ENCOUNTER — Telehealth: Payer: Self-pay | Admitting: *Deleted

## 2013-06-12 ENCOUNTER — Encounter: Payer: Self-pay | Admitting: Cardiology

## 2013-06-12 ENCOUNTER — Ambulatory Visit (INDEPENDENT_AMBULATORY_CARE_PROVIDER_SITE_OTHER): Payer: Medicare Other | Admitting: Cardiology

## 2013-06-12 VITALS — BP 128/60 | HR 67 | Ht 59.0 in | Wt 200.0 lb

## 2013-06-12 DIAGNOSIS — I5022 Chronic systolic (congestive) heart failure: Secondary | ICD-10-CM

## 2013-06-12 DIAGNOSIS — I10 Essential (primary) hypertension: Secondary | ICD-10-CM

## 2013-06-12 DIAGNOSIS — I5042 Chronic combined systolic (congestive) and diastolic (congestive) heart failure: Secondary | ICD-10-CM | POA: Insufficient documentation

## 2013-06-12 DIAGNOSIS — T148XXA Other injury of unspecified body region, initial encounter: Secondary | ICD-10-CM | POA: Diagnosis not present

## 2013-06-12 DIAGNOSIS — R0602 Shortness of breath: Secondary | ICD-10-CM

## 2013-06-12 DIAGNOSIS — R0609 Other forms of dyspnea: Secondary | ICD-10-CM | POA: Insufficient documentation

## 2013-06-12 DIAGNOSIS — I428 Other cardiomyopathies: Secondary | ICD-10-CM

## 2013-06-12 DIAGNOSIS — I420 Dilated cardiomyopathy: Secondary | ICD-10-CM

## 2013-06-12 NOTE — Patient Instructions (Addendum)
Your physician recommends that you continue on your current medications as directed. Please refer to the Current Medication list given to you today.  Your physician has requested that you have an echocardiogram. Echocardiography is a painless test that uses sound waves to create images of your heart. It provides your doctor with information about the size and shape of your heart and how well your heart's chambers and valves are working. This procedure takes approximately one hour. There are no restrictions for this procedure.  Your physician recommends that you go to the lab today for a BMET, TSH, BNP, PT/INR, and CBC w platlet count.  We want to stress the importance  Of a low sodium diet 2000mg  of sodium a day  Your physician recommends that you schedule a follow-up appointment in: 2-3 weeks

## 2013-06-12 NOTE — Telephone Encounter (Signed)
On 06-12-13 fax medical records to [facility] it was consult note, end of tx note.

## 2013-06-12 NOTE — Progress Notes (Signed)
[address] [place], [place]  [address] Phone: [phone] Fax:  [phone]  Date:  06/12/2013   ID:  [name], DOB [birth year], MRN [id number]  PCP:  [name]  Cardiologist:  [name], MD     History of Present Illness: [name] is a 53 y.o. 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 She was recently seen by her PCP and was noted to have increasing LE edema requiring increased dose of Lasix.  This had been going on for about 7 months.  She was also diagnosed with LUE lymphedema.  She has chronic SOB that has been stable.  She has had a bloating sensation in her abdomen.  She has 3 pillow orthopnea which she says is stable and unchanged.  She denies any chest pain but has had some pressure.  She describes this as a heaviness that is always there.  She cooks with salt and uses table salt.  She says that she has problems with feeling lightheaded and has been falling more.  She also is bruising more.     Wt Readings from Last 3 Encounters:  06/12/13 200 lb (90.719 kg)  03/05/13 202 lb 4.8 oz (91.763 kg)  09/05/12 200 lb 8 oz (90.946 kg)     Past Medical History  Diagnosis Date  . Depression   . Hypothyroidism   . Congestive heart failure   . Emphysema of lung   . GERD (gastroesophageal reflux disease)   . Heart murmur   . Anemia 05/19/2011  . Nonischemic cardiomyopathy     LVEF 38%. MUGA, 2010, 50-55% by ECHO 04/14/11. Cardiologist Dr. [name]  . Pneumonia   . History of bronchitis   . Shortness of breath 07/16/11    "at all times"  . Blood transfusion   . Chronic lower back pain   . Breast cancer 07/16/2011    left breast lumpectomy=invasive ductal ca,2. cm,high grade ductal ca in situ,11 benign lymph nodes  . Obesity   . Allergy   . Neuromuscular disorder     tingling left hand  . Myocardial infarction 2003  . Hyperlipidemia   . History of radiation  therapy 09/06/11-11/24/11    left breast  60.4gray total dose  . Coronary artery disease     Negative cath 2001  . Anxiety   . Urticaria   . Other primary cardiomyopathies   . Chest pain, unspecified     Sharp midsternal chest pain at night with no radiation. Hard for her to take a deep breath in. NTG does help. Normal Myocardial perfusion study 05/14/11.  [name] Hypertension   . Chronic combined systolic and diastolic CHF (congestive heart failure)     Current Outpatient Prescriptions  Medication Sig Dispense Refill  . anastrozole (ARIMIDEX) 1 MG tablet TAKE ONE TABLET BY MOUTH ONCE DAILY  90 tablet  0  . carvedilol (COREG) 25 MG tablet Take 25 mg by mouth 2 (two) times daily with a meal.       . clarithromycin (BIAXIN) 500 MG tablet       . cyclobenzaprine (FLEXERIL) 5 MG tablet       . furosemide (LASIX) 80 MG tablet 80 mg 3 (three) times daily.       [name] levothyroxine (SYNTHROID, LEVOTHROID) 75 MCG tablet Take 75 mcg by mouth every morning.      [name] lisinopril (PRINIVIL,ZESTRIL) 20 MG tablet  Take 20 mg by mouth every morning.       . lubiprostone (AMITIZA) 24 MCG capsule       . Melatonin 10 MG TABS Take 3 tablets by mouth at bedtime.      . Menthol, Topical Analgesic, (ZIMS MAX-FREEZE) 3.7 % GEL Apply 1 application topically daily as needed (for pain). Apply to back and legs      . mupirocin ointment (BACTROBAN) 2 % APPLY TOPICALLY TO AFFECTED AREA AS DIRECTED  30 g  4  . nicotine (NICODERM CQ - DOSED IN MG/24 HOURS) 21 mg/24hr patch Place 1 patch onto the skin daily.      . nitroGLYCERIN (NITROSTAT) 0.4 MG SL tablet Place 0.4 mg under the tongue every 5 (five) minutes as needed. As needed for chest pain.      [name] NITROSTAT 0.4 MG SL tablet       . omeprazole (PRILOSEC) 20 MG capsule Take 20 mg by mouth 2 (two) times daily.       [name] OVER THE COUNTER MEDICATION Take 3 capsules by mouth every morning. Green Tea Extract OTC.      [name] oxyCODONE-acetaminophen (PERCOCET/ROXICET) 5-325 MG per tablet Take  1 tablet by mouth every 4 (four) hours as needed for pain.       . potassium chloride SA (K-DUR,KLOR-CON) 20 MEQ tablet Take 1 tablet (20 mEq total) by mouth 2 (two) times daily.  60 tablet  4  . probenecid (BENEMID) 500 MG tablet       . sertraline (ZOLOFT) 50 MG tablet Take 50 mg by mouth every morning.      . traZODone (DESYREL) 50 MG tablet TAKE ONE TABLET BY MOUTH ONCE DAILY AT BEDTIME  30 tablet  3  . zolpidem (AMBIEN) 10 MG tablet Take 10 mg by mouth at bedtime.        No current facility-administered medications for this visit.   Facility-Administered Medications Ordered in Other Visits  Medication Dose [name] Frequency Provider Last Rate Last Dose  . sodium chloride 0.9 % injection 10 mL  10 mL Intracatheter PRN Chauncey Cruel, MD        Allergies:    Allergies  Allergen Reactions  . Aspirin Hives  . Docetaxel Other (See Comments)    Chest tightness and pain  . Morphine And Related Hives and Swelling  . Penicillins Hives  . Other Hives    Social History:  The patient  reports that she quit smoking about 2 years ago. Her smoking use included Cigarettes. She has a 40 pack-year smoking history. She has never used smokeless tobacco. She reports that she drinks alcohol. She reports that she does not use illicit drugs.   Family History:  The patient's family history includes Heart disease in her maternal grandmother; Leukemia in her mother.   ROS:  Please see the history of present illness.      All other systems reviewed and negative.   PHYSICAL EXAM: VS:  BP 128/60  Pulse 67  Ht 4\' 11"  (1.499 m)  Wt 200 lb (90.719 kg)  BMI 40.37 kg/m2 Well nourished, well developed, in no acute distress HEENT: normal Neck: no JVD Cardiac:  normal S1, S2; RRR; no murmur Lungs:  clear to auscultation bilaterally, no wheezing, rhonchi or rales Abd: soft, nontender, no hepatomegaly Ext: no edemasevere bruising over her extremities Skin: warm and dry Neuro:  CNs 2-12 intact, no focal  abnormalities noted  EKG:     NSR with no ST changes  ASSESSMENT AND PLAN:  1. Nonischemic dilated CM - last assessment of EF 50-55% by MUGA and 50% by echo with grade I diastolic dysfunction 2. Chronic combined systolic/diastolic CHF now with complaints of increased LE edema and abdominal bloating but none on exam today.  She is noncompliant with a low sodium diet and has been cooking with sodium and using the salt shaker at the table.   - check 2D echo to reassess LVF - check BMET,TSH,BNP,CBC - I have counseled her on a low sodium diet 2000mg  of sodium a day 3. HTN - controlled 4.  Bruising - check CBC with platelet count and PT/INR  Followup with me in 2-3 weeks  Signed, [name], MD 06/12/2013 4:26 PM

## 2013-06-13 LAB — CBC
HCT: 34.2 % — ABNORMAL LOW (ref 36.0–46.0)
HEMOGLOBIN: 11.4 g/dL — AB (ref 12.0–15.0)
MCHC: 33.3 g/dL (ref 30.0–36.0)
MCV: 86 fl (ref 78.0–100.0)
Platelets: 208 10*3/uL (ref 150.0–400.0)
RBC: 3.98 Mil/uL (ref 3.87–5.11)
RDW: 14.4 % (ref 11.5–14.6)
WBC: 4.2 10*3/uL — AB (ref 4.5–10.5)

## 2013-06-13 LAB — BASIC METABOLIC PANEL
BUN: 19 mg/dL (ref 6–23)
CO2: 29 meq/L (ref 19–32)
Calcium: 9.7 mg/dL (ref 8.4–10.5)
Chloride: 101 mEq/L (ref 96–112)
Creatinine, Ser: 1.4 mg/dL — ABNORMAL HIGH (ref 0.4–1.2)
GFR: 52.58 mL/min — AB (ref >60.00)
Glucose, Bld: 99 mg/dL (ref 70–99)
POTASSIUM: 3.8 meq/L (ref 3.5–5.1)
SODIUM: 139 meq/L (ref 135–145)

## 2013-06-13 LAB — TSH: TSH: 0.4 u[IU]/mL (ref 0.35–5.50)

## 2013-06-13 LAB — BRAIN NATRIURETIC PEPTIDE: Pro B Natriuretic peptide (BNP): 79 pg/mL (ref 0.0–100.0)

## 2013-06-15 ENCOUNTER — Other Ambulatory Visit: Payer: Self-pay | Admitting: Physician Assistant

## 2013-06-18 NOTE — Progress Notes (Signed)
You had verbalized to me to order but it is not in your note, but it is in mine. They did not draw on pt though. Do You still want pt to have this done?

## 2013-06-18 NOTE — Progress Notes (Signed)
Yes please get a PTT/INR

## 2013-06-20 ENCOUNTER — Ambulatory Visit (HOSPITAL_COMMUNITY): Payer: Medicare Other | Attending: Cardiology | Admitting: Radiology

## 2013-06-20 DIAGNOSIS — I5022 Chronic systolic (congestive) heart failure: Secondary | ICD-10-CM | POA: Diagnosis not present

## 2013-06-20 NOTE — Progress Notes (Signed)
Echocardiogram performed.  

## 2013-06-21 ENCOUNTER — Other Ambulatory Visit: Payer: Self-pay | Admitting: *Deleted

## 2013-06-21 DIAGNOSIS — I429 Cardiomyopathy, unspecified: Secondary | ICD-10-CM

## 2013-07-03 ENCOUNTER — Ambulatory Visit (HOSPITAL_COMMUNITY): Payer: Medicare Other | Attending: Cardiology | Admitting: Radiology

## 2013-07-03 ENCOUNTER — Ambulatory Visit: Payer: Medicare Other | Admitting: Cardiology

## 2013-07-03 VITALS — BP 124/82 | HR 66 | Ht 59.0 in | Wt 198.0 lb

## 2013-07-03 DIAGNOSIS — R42 Dizziness and giddiness: Secondary | ICD-10-CM | POA: Diagnosis not present

## 2013-07-03 DIAGNOSIS — R0789 Other chest pain: Secondary | ICD-10-CM | POA: Diagnosis not present

## 2013-07-03 DIAGNOSIS — I10 Essential (primary) hypertension: Secondary | ICD-10-CM | POA: Diagnosis not present

## 2013-07-03 DIAGNOSIS — R0602 Shortness of breath: Secondary | ICD-10-CM | POA: Diagnosis not present

## 2013-07-03 DIAGNOSIS — R51 Headache: Secondary | ICD-10-CM

## 2013-07-03 DIAGNOSIS — Z87891 Personal history of nicotine dependence: Secondary | ICD-10-CM | POA: Diagnosis not present

## 2013-07-03 DIAGNOSIS — I252 Old myocardial infarction: Secondary | ICD-10-CM | POA: Insufficient documentation

## 2013-07-03 DIAGNOSIS — R079 Chest pain, unspecified: Secondary | ICD-10-CM | POA: Diagnosis not present

## 2013-07-03 DIAGNOSIS — I429 Cardiomyopathy, unspecified: Secondary | ICD-10-CM

## 2013-07-03 MED ORDER — TECHNETIUM TC 99M SESTAMIBI GENERIC - CARDIOLITE
30.0000 | Freq: Once | INTRAVENOUS | Status: AC | PRN
  Administered 2013-07-03: 30 via INTRAVENOUS

## 2013-07-03 MED ORDER — REGADENOSON 0.4 MG/5ML IV SOLN
0.4000 mg | Freq: Once | INTRAVENOUS | Status: AC
  Administered 2013-07-03: 0.4 mg via INTRAVENOUS

## 2013-07-03 MED ORDER — AMINOPHYLLINE 25 MG/ML IV SOLN
75.0000 mg | Freq: Once | INTRAVENOUS | Status: AC
  Administered 2013-07-03: 75 mg via INTRAVENOUS

## 2013-07-03 NOTE — Progress Notes (Signed)
  [facility] [address] [place], [place] [address] [phone]    Cardiology Nuclear Med Study  SILVER ACHEY is a 53 y.o. female     MRN : [id number]     DOB: 26-Aug-1960  Procedure Date: 07/03/2013  Nuclear Med Background Indication for Stress Test:  Evaluation for Ischemia History:  MI, Cath (normal) 2001, Echo 2015 EF 30-35%, MPI (normal), 2010 MUGA EF 50-55% Cardiac Risk Factors: History of Smoking and Hypertension  Symptoms:  Chest Pressure, Light-Headedness and SOB   Nuclear Pre-Procedure Caffeine/Decaff Intake:  None NPO After: 11:00pm   Lungs:  clear O2 Sat: 96% on room air. IV 0.9% NS with Angio Cath:  22g  IV Site: R Hand  IV Started by:  Matilde Haymaker, RN  Chest Size (in):  38 Cup Size: D  Height: 4\' 11"  (1.499 m)  Weight:  198 lb (89.812 kg)  BMI:  Body mass index is 39.97 kg/(m^2). Tech Comments:  No Coreg x 17 hrs    Nuclear Med Study 1 or 2 day study: 2 day  Stress Test Type:  Lexiscan  Reading MD: n/a  Order Authorizing Provider:  Vonna Drafts  Resting Radionuclide: Technetium 73m Sestamibi  Resting Radionuclide Dose: 33.0 mCi on 07/04/13   Stress Radionuclide:  Technetium 5m Sestamibi  Stress Radionuclide Dose: 33.0 mCi on 07/03/13           Stress Protocol Rest HR: 66 Stress HR: 90  Rest BP: 124/82 Stress BP: 105/74  Exercise Time (min): n/a METS: n/a           Dose of Adenosine (mg):  n/a Dose of Lexiscan: 0.4 mg  Dose of Atropine (mg): n/a Dose of Dobutamine: n/a mcg/kg/min (at max HR)  Stress Test Technologist: Glade Lloyd, BS-ES  Nuclear Technologist:  Charlton Amor, CNMT     Rest Procedure:  Myocardial perfusion imaging was performed at rest 45 minutes following the intravenous administration of Technetium 18m Sestamibi. Rest ECG: NSR with non-specific ST-T wave changes  Stress Procedure:  The patient received IV Lexiscan 0.4 mg over 15-seconds.  Technetium 55m Sestamibi injected at 30-seconds.   Quantitative spect images were obtained after a 45 minute delay.  During the infusion of Lexiscan, the patient complained of stomach cramping and headache.  This was not subsiding so 75 mg aminophylline was administered.  Her symptoms began to subside significantly within a minute.  Stress ECG: No significant change from baseline ECG  QPS Raw Data Images:  Normal; no motion artifact; normal heart/lung ratio. Stress Images:  Normal homogeneous uptake in all areas of the myocardium. Rest Images:  Normal homogeneous uptake in all areas of the myocardium. Subtraction (SDS):  No evidence of ischemia. Transient Ischemic Dilatation (Normal <1.22):  0.92 Lung/Heart Ratio (Normal <0.45):  0.41  Quantitative Gated Spect Images QGS EDV:  100 ml QGS ESV:  53 ml  Impression Exercise Capacity:  Lexiscan with no exercise. BP Response:  Normal blood pressure response. Clinical Symptoms:  No chest pain. ECG Impression:  No significant ST segment change suggestive of ischemia. Comparison with Prior Nuclear Study: No images to compare  Overall Impression:  Normal stress nuclear study. No evidence of ischemia or scar. Borderline low EF   LV Ejection Fraction: 47%.  LV Wall Motion:  Normal Wall Motion  Darlin Coco MD

## 2013-07-04 ENCOUNTER — Telehealth: Payer: Self-pay | Admitting: Cardiology

## 2013-07-04 ENCOUNTER — Ambulatory Visit (HOSPITAL_COMMUNITY): Payer: Medicare Other | Attending: Cardiology

## 2013-07-04 DIAGNOSIS — R0989 Other specified symptoms and signs involving the circulatory and respiratory systems: Secondary | ICD-10-CM

## 2013-07-04 MED ORDER — TECHNETIUM TC 99M SESTAMIBI GENERIC - CARDIOLITE
33.0000 | Freq: Once | INTRAVENOUS | Status: AC | PRN
Start: 2013-07-04 — End: 2013-07-04
  Administered 2013-07-04: 33 via INTRAVENOUS

## 2013-07-04 NOTE — Telephone Encounter (Signed)
Made pt aware takes 6 hours for tracer to get out of pts system.

## 2013-07-04 NOTE — Telephone Encounter (Signed)
New message     Patient calling back can she be around her grandchild.

## 2013-07-09 ENCOUNTER — Other Ambulatory Visit: Payer: Medicare Other

## 2013-07-09 ENCOUNTER — Telehealth: Payer: Self-pay | Admitting: Oncology

## 2013-07-09 ENCOUNTER — Ambulatory Visit: Payer: Medicare Other | Admitting: Physician Assistant

## 2013-07-09 ENCOUNTER — Ambulatory Visit: Payer: Medicare Other | Admitting: Cardiology

## 2013-07-09 NOTE — Telephone Encounter (Signed)
pt called to cx appt due to not having mammo yet...Marland Kitchenper pt she will call back to r/s

## 2013-07-12 ENCOUNTER — Other Ambulatory Visit: Payer: Self-pay | Admitting: Physician Assistant

## 2013-07-12 DIAGNOSIS — Z853 Personal history of malignant neoplasm of breast: Secondary | ICD-10-CM

## 2013-07-13 ENCOUNTER — Telehealth: Payer: Self-pay | Admitting: Physician Assistant

## 2013-07-13 NOTE — Telephone Encounter (Signed)
, °

## 2013-07-14 ENCOUNTER — Other Ambulatory Visit: Payer: Self-pay | Admitting: Physician Assistant

## 2013-07-14 DIAGNOSIS — C50419 Malignant neoplasm of upper-outer quadrant of unspecified female breast: Secondary | ICD-10-CM

## 2013-08-02 ENCOUNTER — Other Ambulatory Visit: Payer: Self-pay | Admitting: Oncology

## 2013-08-02 ENCOUNTER — Other Ambulatory Visit: Payer: Self-pay | Admitting: *Deleted

## 2013-08-04 ENCOUNTER — Telehealth: Payer: Self-pay | Admitting: Oncology

## 2013-08-04 NOTE — Telephone Encounter (Signed)
s.w. pt and advised on June appt...pt needed mon-wed appt....pt ok and aware of appts

## 2013-08-15 DIAGNOSIS — I5042 Chronic combined systolic (congestive) and diastolic (congestive) heart failure: Secondary | ICD-10-CM | POA: Diagnosis not present

## 2013-08-15 DIAGNOSIS — I428 Other cardiomyopathies: Secondary | ICD-10-CM | POA: Diagnosis not present

## 2013-08-15 DIAGNOSIS — E662 Morbid (severe) obesity with alveolar hypoventilation: Secondary | ICD-10-CM | POA: Diagnosis not present

## 2013-08-15 DIAGNOSIS — R0602 Shortness of breath: Secondary | ICD-10-CM | POA: Diagnosis not present

## 2013-08-17 IMAGING — CR DG CHEST 2V
2 series · 2 of 2 positions shown · non-contrast
Comparison: Prior chest x-ray 03/30/2011

CLINICAL DATA: Preoperative chest x-ray prior to portacatheter
removal

CHEST - 2 VIEW

[w chest pa]
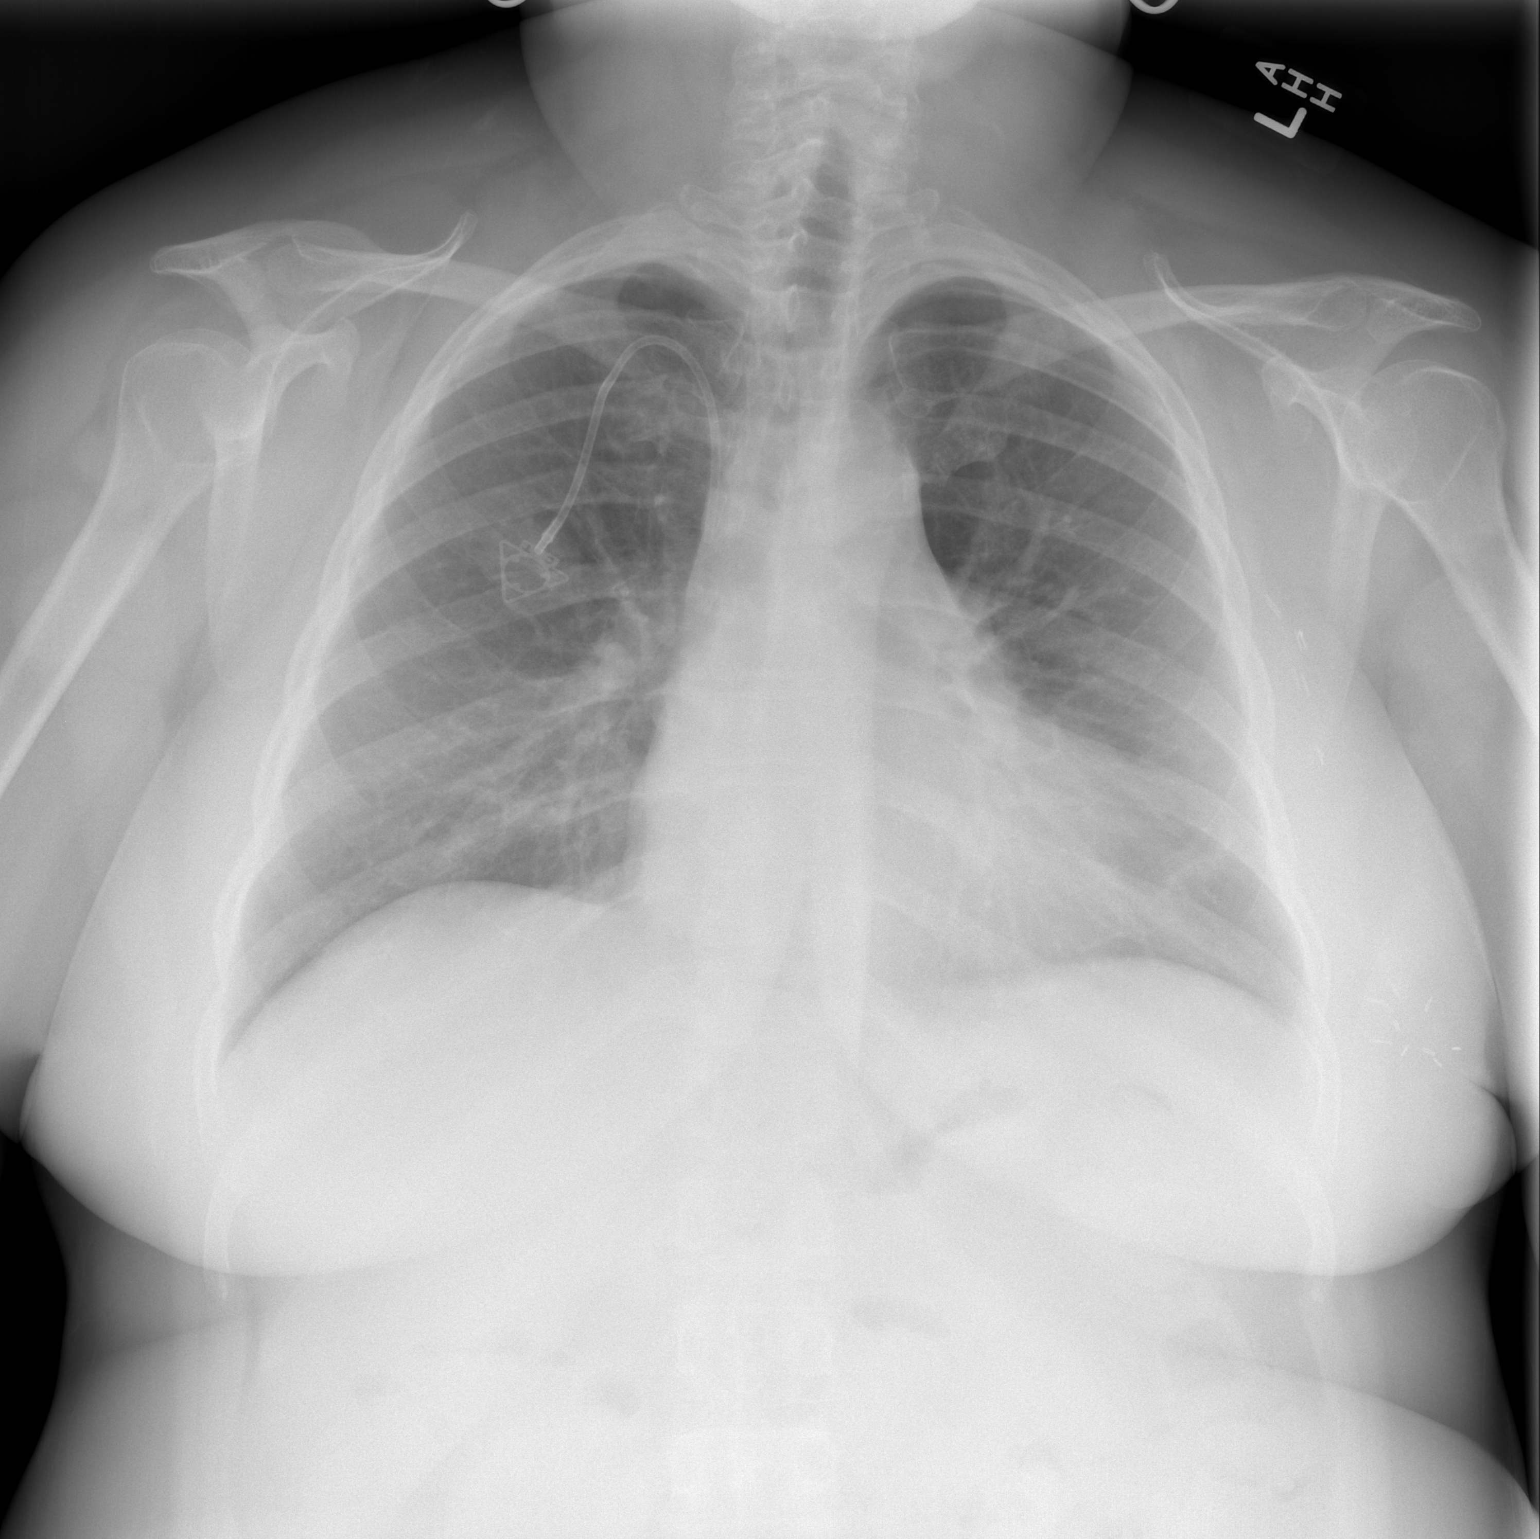

[w chest lat]
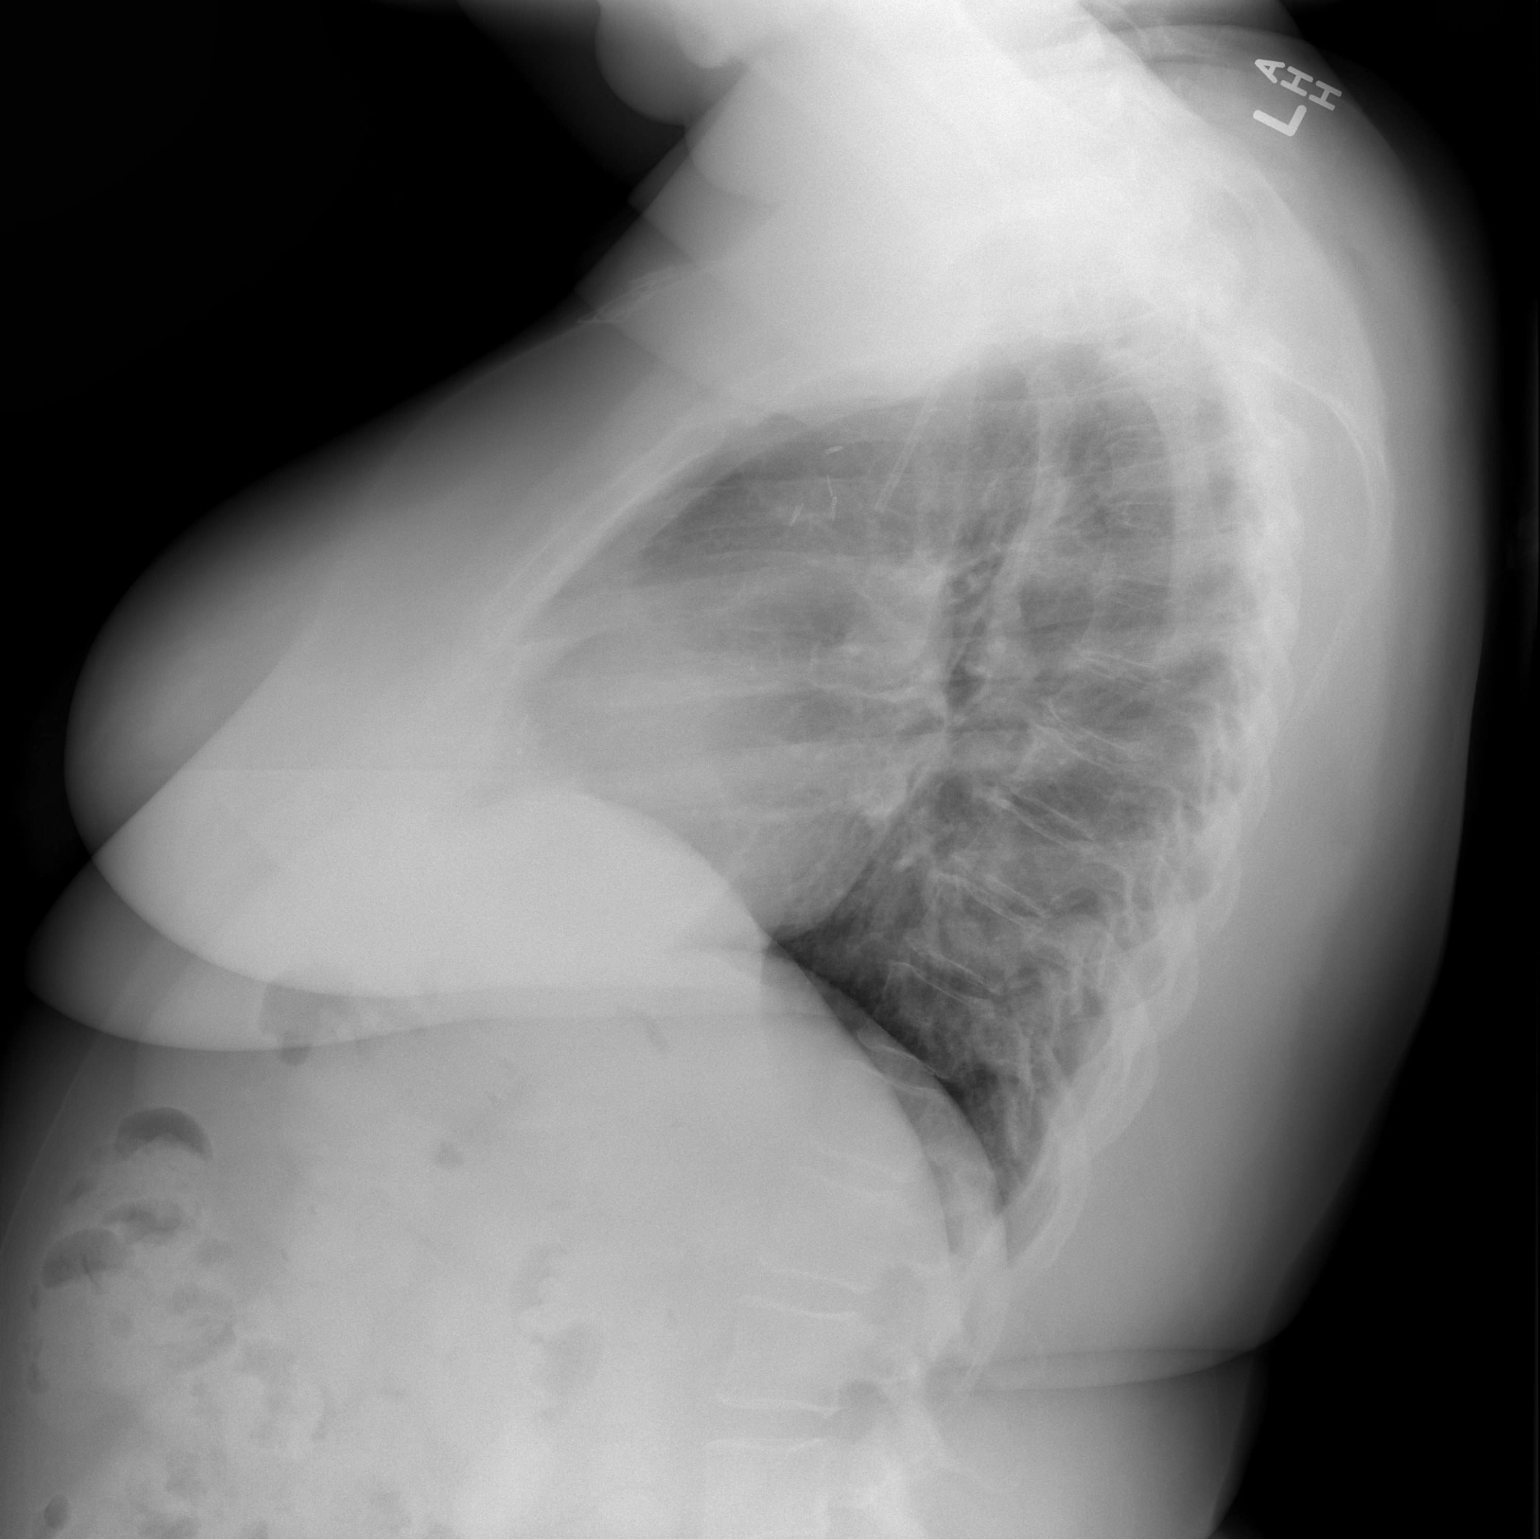

[2 of 2 positions shown; findings below may reference images not displayed]

FINDINGS: Right subclavian approach single lumen portacatheter.
The tip of the catheter projects over the mid SVC. Surgical clips
in the left breast and left axilla.  Cardiac and mediastinal
contours are within normal limits.  The lungs are well-aerated.
Negative for edema, consolidation or suspicious pulmonary nodule.
No acute osseous abnormality.
IMPRESSION: No acute cardiopulmonary disease

## 2013-08-23 ENCOUNTER — Other Ambulatory Visit: Payer: Self-pay | Admitting: Physician Assistant

## 2013-08-23 DIAGNOSIS — C50919 Malignant neoplasm of unspecified site of unspecified female breast: Secondary | ICD-10-CM

## 2013-08-30 ENCOUNTER — Ambulatory Visit: Payer: Medicare Other | Admitting: Physician Assistant

## 2013-08-30 ENCOUNTER — Other Ambulatory Visit: Payer: Medicare Other

## 2013-09-04 ENCOUNTER — Other Ambulatory Visit: Payer: Self-pay | Admitting: Physician Assistant

## 2013-09-04 ENCOUNTER — Other Ambulatory Visit: Payer: Self-pay | Admitting: Family Medicine

## 2013-09-04 DIAGNOSIS — R1013 Epigastric pain: Secondary | ICD-10-CM | POA: Diagnosis not present

## 2013-09-04 DIAGNOSIS — IMO0002 Reserved for concepts with insufficient information to code with codable children: Secondary | ICD-10-CM | POA: Diagnosis not present

## 2013-09-04 DIAGNOSIS — R112 Nausea with vomiting, unspecified: Secondary | ICD-10-CM | POA: Diagnosis not present

## 2013-09-04 DIAGNOSIS — Z853 Personal history of malignant neoplasm of breast: Secondary | ICD-10-CM

## 2013-09-04 DIAGNOSIS — R109 Unspecified abdominal pain: Secondary | ICD-10-CM

## 2013-09-04 DIAGNOSIS — M1710 Unilateral primary osteoarthritis, unspecified knee: Secondary | ICD-10-CM | POA: Diagnosis not present

## 2013-09-04 DIAGNOSIS — E039 Hypothyroidism, unspecified: Secondary | ICD-10-CM | POA: Diagnosis not present

## 2013-09-04 DIAGNOSIS — I509 Heart failure, unspecified: Secondary | ICD-10-CM | POA: Diagnosis not present

## 2013-09-05 ENCOUNTER — Other Ambulatory Visit: Payer: Medicare Other

## 2013-09-11 ENCOUNTER — Telehealth: Payer: Self-pay | Admitting: Oncology

## 2013-09-11 NOTE — Telephone Encounter (Signed)
cx appt and will call back after mammo to r/s

## 2013-09-12 ENCOUNTER — Ambulatory Visit: Payer: Medicare Other | Admitting: Physician Assistant

## 2013-09-12 ENCOUNTER — Other Ambulatory Visit: Payer: Medicare Other

## 2013-09-16 ENCOUNTER — Other Ambulatory Visit: Payer: Self-pay | Admitting: Physician Assistant

## 2013-09-19 ENCOUNTER — Ambulatory Visit
Admission: RE | Admit: 2013-09-19 | Discharge: 2013-09-19 | Disposition: A | Discharge status: Home / Self Care | Payer: Medicare Other | Source: Ambulatory Visit | Attending: Physician Assistant | Admitting: Physician Assistant

## 2013-09-19 DIAGNOSIS — Z853 Personal history of malignant neoplasm of breast: Secondary | ICD-10-CM

## 2013-09-19 DIAGNOSIS — R928 Other abnormal and inconclusive findings on diagnostic imaging of breast: Secondary | ICD-10-CM | POA: Diagnosis not present

## 2013-09-20 DIAGNOSIS — L508 Other urticaria: Secondary | ICD-10-CM | POA: Diagnosis not present

## 2013-10-15 DIAGNOSIS — I5042 Chronic combined systolic (congestive) and diastolic (congestive) heart failure: Secondary | ICD-10-CM | POA: Diagnosis not present

## 2013-10-15 DIAGNOSIS — Z853 Personal history of malignant neoplasm of breast: Secondary | ICD-10-CM | POA: Diagnosis not present

## 2013-10-15 DIAGNOSIS — I428 Other cardiomyopathies: Secondary | ICD-10-CM | POA: Diagnosis not present

## 2013-10-15 DIAGNOSIS — E662 Morbid (severe) obesity with alveolar hypoventilation: Secondary | ICD-10-CM | POA: Diagnosis not present

## 2013-11-30 ENCOUNTER — Other Ambulatory Visit: Payer: Self-pay | Admitting: *Deleted

## 2013-11-30 DIAGNOSIS — C50919 Malignant neoplasm of unspecified site of unspecified female breast: Secondary | ICD-10-CM

## 2013-11-30 MED ORDER — ANASTROZOLE 1 MG PO TABS
ORAL_TABLET | ORAL | Status: DC
Start: 2013-11-30 — End: 2013-12-30

## 2013-11-30 NOTE — Telephone Encounter (Signed)
Pt reminded to reschedule appointment with Dr Jana Hakim. Had mammogram

## 2013-12-04 DIAGNOSIS — G4700 Insomnia, unspecified: Secondary | ICD-10-CM | POA: Diagnosis not present

## 2013-12-04 DIAGNOSIS — F3289 Other specified depressive episodes: Secondary | ICD-10-CM | POA: Diagnosis not present

## 2013-12-04 DIAGNOSIS — E039 Hypothyroidism, unspecified: Secondary | ICD-10-CM | POA: Diagnosis not present

## 2013-12-04 DIAGNOSIS — IMO0002 Reserved for concepts with insufficient information to code with codable children: Secondary | ICD-10-CM | POA: Diagnosis not present

## 2013-12-04 DIAGNOSIS — I10 Essential (primary) hypertension: Secondary | ICD-10-CM | POA: Diagnosis not present

## 2013-12-04 DIAGNOSIS — I509 Heart failure, unspecified: Secondary | ICD-10-CM | POA: Diagnosis not present

## 2013-12-04 DIAGNOSIS — F329 Major depressive disorder, single episode, unspecified: Secondary | ICD-10-CM | POA: Diagnosis not present

## 2013-12-04 DIAGNOSIS — M545 Low back pain, unspecified: Secondary | ICD-10-CM | POA: Diagnosis not present

## 2013-12-04 DIAGNOSIS — Z79899 Other long term (current) drug therapy: Secondary | ICD-10-CM | POA: Diagnosis not present

## 2013-12-04 DIAGNOSIS — M1710 Unilateral primary osteoarthritis, unspecified knee: Secondary | ICD-10-CM | POA: Diagnosis not present

## 2013-12-04 DIAGNOSIS — I428 Other cardiomyopathies: Secondary | ICD-10-CM | POA: Diagnosis not present

## 2013-12-05 ENCOUNTER — Telehealth: Payer: Self-pay | Admitting: Oncology

## 2013-12-05 NOTE — Telephone Encounter (Signed)
returned pt call re appt w/AB-GM. r/s 6/24 appt to 10/5 lb/HF. pt has new d/t and aware AB is no longer w/us and she will see HF.

## 2013-12-24 ENCOUNTER — Telehealth: Payer: Self-pay | Admitting: Oncology

## 2013-12-24 ENCOUNTER — Ambulatory Visit (HOSPITAL_BASED_OUTPATIENT_CLINIC_OR_DEPARTMENT_OTHER): Payer: Medicare Other | Admitting: Nurse Practitioner

## 2013-12-24 ENCOUNTER — Other Ambulatory Visit: Payer: Self-pay | Admitting: *Deleted

## 2013-12-24 ENCOUNTER — Encounter: Payer: Self-pay | Admitting: Nurse Practitioner

## 2013-12-24 ENCOUNTER — Other Ambulatory Visit (HOSPITAL_BASED_OUTPATIENT_CLINIC_OR_DEPARTMENT_OTHER): Payer: Medicare Other

## 2013-12-24 VITALS — BP 135/88 | HR 79 | Temp 98.6°F | Resp 19 | Ht 59.0 in | Wt 187.8 lb

## 2013-12-24 DIAGNOSIS — C50412 Malignant neoplasm of upper-outer quadrant of left female breast: Secondary | ICD-10-CM | POA: Diagnosis not present

## 2013-12-24 DIAGNOSIS — M810 Age-related osteoporosis without current pathological fracture: Secondary | ICD-10-CM | POA: Diagnosis not present

## 2013-12-24 DIAGNOSIS — E8989 Other postprocedural endocrine and metabolic complications and disorders: Secondary | ICD-10-CM

## 2013-12-24 DIAGNOSIS — C50419 Malignant neoplasm of upper-outer quadrant of unspecified female breast: Secondary | ICD-10-CM

## 2013-12-24 DIAGNOSIS — I890 Lymphedema, not elsewhere classified: Secondary | ICD-10-CM | POA: Diagnosis not present

## 2013-12-24 DIAGNOSIS — R748 Abnormal levels of other serum enzymes: Secondary | ICD-10-CM

## 2013-12-24 DIAGNOSIS — R7989 Other specified abnormal findings of blood chemistry: Secondary | ICD-10-CM

## 2013-12-24 LAB — CBC WITH DIFFERENTIAL/PLATELET
BASO%: 0.4 % (ref 0.0–2.0)
Basophils Absolute: 0 10*3/uL (ref 0.0–0.1)
EOS%: 4.6 % (ref 0.0–7.0)
Eosinophils Absolute: 0.3 10*3/uL (ref 0.0–0.5)
HEMATOCRIT: 38.6 % (ref 34.8–46.6)
HGB: 12.5 g/dL (ref 11.6–15.9)
LYMPH%: 23.4 % (ref 14.0–49.7)
MCH: 27.7 pg (ref 25.1–34.0)
MCHC: 32.4 g/dL (ref 31.5–36.0)
MCV: 85.5 fL (ref 79.5–101.0)
MONO#: 0.3 10*3/uL (ref 0.1–0.9)
MONO%: 6 % (ref 0.0–14.0)
NEUT#: 3.8 10*3/uL (ref 1.5–6.5)
NEUT%: 65.6 % (ref 38.4–76.8)
PLATELETS: 252 10*3/uL (ref 145–400)
RBC: 4.52 10*6/uL (ref 3.70–5.45)
RDW: 14.8 % — ABNORMAL HIGH (ref 11.2–14.5)
WBC: 5.8 10*3/uL (ref 3.9–10.3)
lymph#: 1.3 10*3/uL (ref 0.9–3.3)

## 2013-12-24 LAB — COMPREHENSIVE METABOLIC PANEL (CC13)
ALK PHOS: 78 U/L (ref 40–150)
ALT: 18 U/L (ref 0–55)
AST: 14 U/L (ref 5–34)
Albumin: 3.9 g/dL (ref 3.5–5.0)
Anion Gap: 10 mEq/L (ref 3–11)
BILIRUBIN TOTAL: 0.29 mg/dL (ref 0.20–1.20)
BUN: 14.3 mg/dL (ref 7.0–26.0)
CO2: 28 mEq/L (ref 22–29)
Calcium: 10 mg/dL (ref 8.4–10.4)
Chloride: 104 mEq/L (ref 98–109)
Creatinine: 1.5 mg/dL — ABNORMAL HIGH (ref 0.6–1.1)
Glucose: 105 mg/dl (ref 70–140)
Potassium: 3.8 mEq/L (ref 3.5–5.1)
SODIUM: 143 meq/L (ref 136–145)
Total Protein: 7.9 g/dL (ref 6.4–8.3)

## 2013-12-24 NOTE — Telephone Encounter (Signed)
gv pt appt schedule for april 2016 and bone density test for feb 2016. per 12/24/13 pof pt needs appt w/lymphedema clinic. per pt she needs appt @ [facility]. s/w carol @ [place] [phone]) and per carol she will need an order - demographics and last offiice note and she will call pt. pt aware. no order or referral entered for this appt. s/w natro who is working w/HF. info forwarded to [facility] and she will take care of this appt. info can be faxed to carol at [phone].

## 2013-12-24 NOTE — Progress Notes (Signed)
ID: [name]   DOB: June 15, 1960  MR#: [id number]  OJJ#:[id number]  PCP: [name] GYN:  SU: [name], MD OTHER MD:  Kyung Rudd, MD  CHIEF COMPLAINT:  Left Breast Cancer CURRENT TREATMENT: anastrozole 1mg  daily  BREAST CANCER HISTORY: [name] noted a change in her left breast in October 2012. Dr. Tobie Lords set her up for mammography at the [facility] 01/29/2011, and this showed an irregular high-density mass with ill-defined margins in the upper outer quadrant of the left breast. This was palpable and mobile. Ultrasound showed an irregular hypoechoic mass with lobulated margins measuring up to 2.9 cm. One left axillary lymph node was noted to be enlarged at 2.6 cm. There were no other abnormal lymph nodes noted.   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  With this information the patient was set up for bilateral breast MRIs 02/05/2011 showing in the left breast mass in question to measure up to 4.9 cm. There were multiple small nodules without fatty hila in the left axilla, suspicious for metastatic disease there. The right side was unremarkable and there was no suspicious internal mammary adenopathy.   On 02/08/2011 the patient underwent biopsy of the largest left axillary lymph node, and this was positive([id number]).  Patient was treated in the neoadjuvant setting and received 2 cycles of every 3 week docetaxel/carboplatin. This was discontinued after cycle 2 due to in tolerance with associated chest pain and significant PPE in both the upper and lower extremities. Her treatment course was switched to Q3 week IV CMF/ trastuzumab, but the trastuzumab was held secondary to worsening left ventricular ejection fraction a history of congestive heart  failure. (Echo on 06/15/2011 showed an EF between 35 and 40%.)   Essentially, the patient completed 4 q. three-week doses of  CMF, with last cycle given on 06/22/2011.   She is status post lumpectomy under the care of Dr. Ninfa Linden on 07/16/2011 with what node removal, final pathology showing a residual T2 N0 tumor. Residual tumor was again ER and PR positive and HER-2/neu positive.  Patient received radiation therapy under the care of Dr. Lisbeth Renshaw from June of 2013 until September of 2013. She was then started on anastrozole at 1 mg daily in early November 2013.  Additional history is as detailed below.  INTERVAL HISTORY: [name] returns to clinic today for follow up of her breast cancer. She has been on anastrozole since November 2013 and is tolerating it well with few complaints. She denies night sweats, hot flashes or vaginal changes. She has some bony aches and pains, but these are nothing in comparison with her lower back pain for which she needs opioids. Her left arm and hands remain chronically swollen because of lymphedema. She went to the lymphedema clinic a few times to learn how to wrap her arm, but stopped attending these sessions.  REVIEW OF SYSTEMS: [name] denies fevers, chills, nausea, or vomiting. She has some diarrhea b/c she is on stool softeners and miralax to avoid opioid induced constipation. This does not bother her. Her appetite is good. She has no rashes or mouth sores. She has shortness of breath with rest and activity, mainly attributed to her congestive heart failure. She denies cough, palpitations, or chest pain. A detailed review of systems is otherwise noncontributory.  PAST MEDICAL HISTORY: Past Medical History  Diagnosis Date  . Depression   . Hypothyroidism   . Congestive heart failure   . Emphysema of lung   . GERD (gastroesophageal reflux disease)   . Heart murmur   . Anemia 05/19/2011  . Nonischemic cardiomyopathy     LVEF 38%. MUGA, 2010, 50-55% by ECHO  04/14/11. Cardiologist Dr. [name]  . Pneumonia   . History of bronchitis   . Shortness of breath 07/16/11    "at all times"  . Blood transfusion   . Chronic lower back pain   . Breast cancer 07/16/2011    left breast lumpectomy=invasive ductal ca,2. cm,high grade ductal ca in situ,11 benign lymph nodes  . Obesity   . Allergy   . Neuromuscular disorder     tingling left hand  . Myocardial infarction 2003  . Hyperlipidemia   . History of radiation therapy 09/06/11-11/24/11    left breast  60.4gray total dose  . Coronary artery disease     Negative cath 2001  . Anxiety   . Urticaria   . Other primary cardiomyopathies   . Chest pain, unspecified     Sharp midsternal chest pain at night with no radiation. Hard for her to take a deep breath in. NTG does help. Normal Myocardial perfusion study 05/14/11.  [name] Hypertension   . Chronic combined systolic and diastolic CHF (congestive heart failure)     PAST SURGICAL HISTORY: Past Surgical History  Procedure Laterality Date  . Portacath placement  02/26/2011    Procedure: INSERTION PORT-A-CATH;  Surgeon: Harl Bowie, MD;  Location: [facility];  Service: General;  Laterality: Right;  . Breast biopsy      left breast  . Breast lumpectomy  07/16/11    w/LND; left.ER/PR=positive  . Abdominal hysterectomy  ~2006    partial  . Tubal ligation  1991  . Port-a-cath removal N/A 07/31/2012    Procedure: REMOVAL PORT-A-CATH;  Surgeon: Harl Bowie, MD;  Location: [facility];  Service: General;  Laterality: N/A;  . Cardiac catheterization  2001    negative for CAD    FAMILY HISTORY Family History  Problem Relation Age of Onset  . Leukemia Mother   . Heart disease Maternal Grandmother   The patient's father died at the age of 80 from renal failure. The patient's mother died at the age of 73 from acute leukemia. The patient has 2 brothers and 2 sisters, there is no history of breast or ovarian cancer in the family to her  knowledge.   GYNECOLOGIC HISTORY: Menarche age 52, menopause 2005, the patient never took hormone replacement. She is GX P3, first pregnancy to term age 42.  S/p TAH with right salpingo-oophorectomy remotely.  SOCIAL HISTORY:  (Updated December 2014) The patient is currently unemployed. She is divorced and lives by herself. Her children are Signora Zucco, lives in [place] and works for the post office, she is 52 years old; [name], 60 a lives in [place]. [place] and works for [employer], and [employer], 64, who lives in [place] and works in daycare. The patient has 5 grandchildren.  She attends the [employer] in [place].     ADVANCED DIRECTIVES:  (Updated 03/05/2013)   Karmel tells me her friend, Jeanann Lewandowsky, who can be reached at [phone], would be her healthcare power of attorney. (I do not have a copy of that paperwork.)  HEALTH MAINTENANCE:  (Updated December 2014) History  Substance Use Topics  .  Smoking status: Former Smoker -- 1.00 packs/day for 40 years    Types: Cigarettes    Quit date: 02/20/2011  . Smokeless tobacco: Never Used  . Alcohol Use: 0.0 oz/week     Comment: 07/16/11 "wine coolers; eve     Colonoscopy: never  PAP: not since hysterectomy  Bone density: Feb 2014, Normal  Lipid panel: UTD, Dr. Tobie Lords  Allergies  Allergen Reactions  . Aspirin Hives  . Docetaxel Other (See Comments)    Chest tightness and pain  . Morphine And Related Hives and Swelling  . Penicillins Hives  . Other Hives    Current Outpatient Prescriptions  Medication Sig Dispense Refill  . anastrozole (ARIMIDEX) 1 MG tablet TAKE ONE TABLET BY MOUTH ONCE DAILY  90 tablet  0  . carvedilol (COREG) 25 MG tablet Take 25 mg by mouth 2 (two) times daily with a meal.       . furosemide (LASIX) 80 MG tablet 40 mg 2 (two) times daily.       [name] HYDROcodone-acetaminophen (NORCO/VICODIN) 5-325 MG per tablet Take 1 tablet by mouth every 6 (six) hours as needed for moderate  pain.      [name] levothyroxine (SYNTHROID, LEVOTHROID) 75 MCG tablet Take 75 mcg by mouth every morning.      [name] lisinopril (PRINIVIL,ZESTRIL) 20 MG tablet Take 20 mg by mouth every morning.       . Melatonin 10 MG TABS Take 3 tablets by mouth at bedtime.      . Menthol, Topical Analgesic, (ZIMS MAX-FREEZE) 3.7 % GEL Apply 1 application topically daily as needed (for pain). Apply to back and legs      . nicotine (NICODERM CQ - DOSED IN MG/24 HOURS) 21 mg/24hr patch Place 1 patch onto the skin daily.      [name] omeprazole (PRILOSEC) 20 MG capsule Take 20 mg by mouth 2 (two) times daily.       [name] OVER THE COUNTER MEDICATION Take 3 capsules by mouth every morning. Green Tea Extract OTC.      [name] oxyCODONE-acetaminophen (PERCOCET/ROXICET) 5-325 MG per tablet Take 1 tablet by mouth every 4 (four) hours as needed for pain.       . potassium chloride SA (K-DUR,KLOR-CON) 20 MEQ tablet Take 1 tablet (20 mEq total) by mouth 2 (two) times daily.  60 tablet  4  . sertraline (ZOLOFT) 50 MG tablet Take 50 mg by mouth every morning.      [name] UNABLE TO FIND 1 tablet 3 (three) times daily. Isosrbide dinitrate 20 mg/hydralazine 37.5 mg      . zolpidem (AMBIEN) 10 MG tablet Take 10 mg by mouth at bedtime.       . cyclobenzaprine (FLEXERIL) 5 MG tablet       . mupirocin ointment (BACTROBAN) 2 % APPLY TOPICALLY TO AFFECTED AREA AS DIRECTED  30 g  4  . nitroGLYCERIN (NITROSTAT) 0.4 MG SL tablet Place 0.4 mg under the tongue every 5 (five) minutes as needed. As needed for chest pain.       No current facility-administered medications for this visit.   Facility-Administered Medications Ordered in Other Visits  Medication Dose Route Frequency Provider Last Rate Last Dose  . sodium chloride 0.9 % injection 10 mL  10 mL Intracatheter PRN Chauncey Cruel, MD        OBJECTIVE: Middle-aged [place] American female who appears uncomfortable but is in no acute distress Filed Vitals:   12/24/13 1432  BP: 135/88  Pulse: 79  Temp:  98.6 F (37 C)  Resp: 19     Body mass index is 37.91 kg/(m^2).    ECOG FS: 1 Filed Weights   12/24/13 1432  Weight: 187 lb 12.8 oz (85.186 kg)  Physical Exam: Skin: warm, dry  HEENT: sclerae anicteric, conjunctivae pink, oropharynx clear. No thrush or mucositis.  Lymph Nodes: No cervical or supraclavicular lymphadenopathy  Lungs: clear to auscultation bilaterally, no rales, wheezes, or rhonci  Heart: regular rate and rhythm  Abdomen: obese, soft, non tender, positive bowel sounds  Musculoskeletal: No focal spinal tenderness, severe lymphedema to left arm and hand, nonpitting  Neuro:non focal, well oriented, distant affect    LAB RESULTS: Lab Results  Component Value Date   WBC 5.8 12/24/2013   NEUTROABS 3.8 12/24/2013   HGB 12.5 12/24/2013   HCT 38.6 12/24/2013   MCV 85.5 12/24/2013   PLT 252 12/24/2013      Chemistry      Component Value Date/Time   NA 143 12/24/2013 1418   NA 139 06/12/2013 1654   K 3.8 12/24/2013 1418   K 3.8 06/12/2013 1654   CL 101 06/12/2013 1654   CL 104 09/05/2012 1049   CO2 28 12/24/2013 1418   CO2 29 06/12/2013 1654   BUN 14.3 12/24/2013 1418   BUN 19 06/12/2013 1654   CREATININE 1.5* 12/24/2013 1418   CREATININE 1.4* 06/12/2013 1654      Component Value Date/Time   CALCIUM 10.0 12/24/2013 1418   CALCIUM 9.7 06/12/2013 1654   ALKPHOS 78 12/24/2013 1418   ALKPHOS 76 10/20/2011 1324   AST 14 12/24/2013 1418   AST 22 10/20/2011 1324   ALT 18 12/24/2013 1418   ALT 20 10/20/2011 1324   BILITOT 0.29 12/24/2013 1418   BILITOT 0.3 10/20/2011 1324        STUDIES:  Most recent bilateral mammogram on 09/19/2013 was unremarkable.  Most recent bone density test on 05/03/2012 was normal.   ASSESSMENT: 53 y.o.  [name]  (1) status post left breast biopsy on November 2012 for an invasive ductal carcinoma, grade 2, which was estrogen receptor positive at 99%, progesterone receptor weakly positive at 2%, with an MIB-1 of 61%, and HER-2 amplification by CISH  with a ratio of 2.87.   Biopsy of a left axillary node was also positive for cancer.  (2)  treated in the neoadjuvant setting and received 2 cycles of every 3 week docetaxel/carboplatin. This was discontinued after cycle 2 due to in tolerance with associated chest pain and significant PPE in both the upper and lower extremities.  (3)   Her treatment course was switched to Q3 week IV CMF/ trastuzumab, but the trastuzumab was held secondary to worsening left ventricular ejection fraction a history of congestive heart failure. (Echo on 06/15/2011 showed an EF between 35 and 40%.)   Essentially, the patient completed 4 q. three-week doses of  CMF, with last cycle given on 06/22/2011.   (4)  status post lumpectomy under the care of Dr. Ninfa Linden on 07/16/2011 with what node removal, final pathology showing a residual T2 N0 tumor. Residual tumor was again ER and PR positive and HER-2/neu positive.  (5)  Patient received radiation therapy under the care of Dr. Lisbeth Renshaw from June of 2013 until September of 2013.   (6)  She was then started on anastrozole at 1 mg daily in early November 2013.  (7)  multiple comorbidities including hypothyroidism, history of congestive heart failure, COPD, GERD, gout, and depression.  (  8)  severe swelling in the left upper extremity, worsening since November 2014   PLAN: [name] feels "ok" today. She states that the weight of all of her comorbidities weighs her down mentally sometimes. The labs were reviewed in detail and were mostly stable, though her creatinine continues to trend upwards and is chronically elevated. Other than that, she is tolerating the anastrozle well and the plan is to continue until at least November 2018. She is now 2 years out from her definitive surgery with no evidence of recurrent disease.  As far as her lymphedema is concerned, I have convinced her to restart her therapy. She will meet with them in the upcoming weeks and will hopefully see  improvement to this area.   Landon will return in 6 months for labs and an evaluation. Her next bone density scan will be due in February. She understands and agrees with this plan. She knows the goal of treatment in her case is cure. She has been encouraged to call with any issues that might arise before her next visit here.   Marcelino Duster, NP     12/24/2013

## 2013-12-25 DIAGNOSIS — J209 Acute bronchitis, unspecified: Secondary | ICD-10-CM | POA: Diagnosis not present

## 2013-12-30 ENCOUNTER — Emergency Department (HOSPITAL_COMMUNITY): Payer: Medicare Other

## 2013-12-30 ENCOUNTER — Emergency Department (HOSPITAL_COMMUNITY)
Admission: EM | Admit: 2013-12-30 | Discharge: 2013-12-30 | Disposition: A | Discharge status: Home / Self Care | Payer: Medicare Other | Attending: Emergency Medicine | Admitting: Emergency Medicine

## 2013-12-30 ENCOUNTER — Encounter (HOSPITAL_COMMUNITY): Payer: Self-pay | Admitting: Emergency Medicine

## 2013-12-30 DIAGNOSIS — J439 Emphysema, unspecified: Secondary | ICD-10-CM | POA: Insufficient documentation

## 2013-12-30 DIAGNOSIS — I5042 Chronic combined systolic (congestive) and diastolic (congestive) heart failure: Secondary | ICD-10-CM | POA: Insufficient documentation

## 2013-12-30 DIAGNOSIS — Z8701 Personal history of pneumonia (recurrent): Secondary | ICD-10-CM | POA: Insufficient documentation

## 2013-12-30 DIAGNOSIS — K219 Gastro-esophageal reflux disease without esophagitis: Secondary | ICD-10-CM | POA: Insufficient documentation

## 2013-12-30 DIAGNOSIS — Z79899 Other long term (current) drug therapy: Secondary | ICD-10-CM | POA: Insufficient documentation

## 2013-12-30 DIAGNOSIS — Z923 Personal history of irradiation: Secondary | ICD-10-CM | POA: Diagnosis not present

## 2013-12-30 DIAGNOSIS — J40 Bronchitis, not specified as acute or chronic: Secondary | ICD-10-CM | POA: Insufficient documentation

## 2013-12-30 DIAGNOSIS — R079 Chest pain, unspecified: Secondary | ICD-10-CM | POA: Insufficient documentation

## 2013-12-30 DIAGNOSIS — R011 Cardiac murmur, unspecified: Secondary | ICD-10-CM | POA: Insufficient documentation

## 2013-12-30 DIAGNOSIS — R069 Unspecified abnormalities of breathing: Secondary | ICD-10-CM | POA: Diagnosis not present

## 2013-12-30 DIAGNOSIS — I2510 Atherosclerotic heart disease of native coronary artery without angina pectoris: Secondary | ICD-10-CM | POA: Diagnosis not present

## 2013-12-30 DIAGNOSIS — Z862 Personal history of diseases of the blood and blood-forming organs and certain disorders involving the immune mechanism: Secondary | ICD-10-CM | POA: Diagnosis not present

## 2013-12-30 DIAGNOSIS — E669 Obesity, unspecified: Secondary | ICD-10-CM | POA: Insufficient documentation

## 2013-12-30 DIAGNOSIS — E039 Hypothyroidism, unspecified: Secondary | ICD-10-CM | POA: Insufficient documentation

## 2013-12-30 DIAGNOSIS — I509 Heart failure, unspecified: Secondary | ICD-10-CM | POA: Insufficient documentation

## 2013-12-30 DIAGNOSIS — I10 Essential (primary) hypertension: Secondary | ICD-10-CM | POA: Diagnosis not present

## 2013-12-30 DIAGNOSIS — Z872 Personal history of diseases of the skin and subcutaneous tissue: Secondary | ICD-10-CM | POA: Insufficient documentation

## 2013-12-30 DIAGNOSIS — Z853 Personal history of malignant neoplasm of breast: Secondary | ICD-10-CM | POA: Insufficient documentation

## 2013-12-30 DIAGNOSIS — Z8639 Personal history of other endocrine, nutritional and metabolic disease: Secondary | ICD-10-CM | POA: Diagnosis not present

## 2013-12-30 DIAGNOSIS — R0602 Shortness of breath: Secondary | ICD-10-CM | POA: Diagnosis not present

## 2013-12-30 DIAGNOSIS — Z880 Allergy status to penicillin: Secondary | ICD-10-CM | POA: Diagnosis not present

## 2013-12-30 DIAGNOSIS — I252 Old myocardial infarction: Secondary | ICD-10-CM | POA: Insufficient documentation

## 2013-12-30 DIAGNOSIS — G8929 Other chronic pain: Secondary | ICD-10-CM | POA: Insufficient documentation

## 2013-12-30 LAB — BASIC METABOLIC PANEL
Anion gap: 17 — ABNORMAL HIGH (ref 5–15)
BUN: 25 mg/dL — ABNORMAL HIGH (ref 6–23)
CALCIUM: 9.8 mg/dL (ref 8.4–10.5)
CHLORIDE: 98 meq/L (ref 96–112)
CO2: 24 meq/L (ref 19–32)
Creatinine, Ser: 1.56 mg/dL — ABNORMAL HIGH (ref 0.50–1.10)
GFR calc Af Amer: 43 mL/min — ABNORMAL LOW (ref >90)
GFR calc non Af Amer: 37 mL/min — ABNORMAL LOW (ref >90)
Glucose, Bld: 94 mg/dL (ref 70–99)
Potassium: 3.7 mEq/L (ref 3.7–5.3)
SODIUM: 139 meq/L (ref 137–147)

## 2013-12-30 LAB — CBC WITH DIFFERENTIAL/PLATELET
Basophils Absolute: 0.1 10*3/uL (ref 0.0–0.1)
Basophils Relative: 1 % (ref 0–1)
Eosinophils Absolute: 0.2 10*3/uL (ref 0.0–0.7)
Eosinophils Relative: 3 % (ref 0–5)
HEMATOCRIT: 38.5 % (ref 36.0–46.0)
Hemoglobin: 13 g/dL (ref 12.0–15.0)
LYMPHS ABS: 2.9 10*3/uL (ref 0.7–4.0)
LYMPHS PCT: 37 % (ref 12–46)
MCH: 28.1 pg (ref 26.0–34.0)
MCHC: 33.8 g/dL (ref 30.0–36.0)
MCV: 83.3 fL (ref 78.0–100.0)
MONO ABS: 0.5 10*3/uL (ref 0.1–1.0)
Monocytes Relative: 7 % (ref 3–12)
Neutro Abs: 4.1 10*3/uL (ref 1.7–7.7)
Neutrophils Relative %: 52 % (ref 43–77)
PLATELETS: 220 10*3/uL (ref 150–400)
RBC: 4.62 MIL/uL (ref 3.87–5.11)
RDW: 13.9 % (ref 11.5–15.5)
WBC: 7.8 10*3/uL (ref 4.0–10.5)

## 2013-12-30 LAB — URINE MICROSCOPIC-ADD ON

## 2013-12-30 LAB — URINALYSIS, ROUTINE W REFLEX MICROSCOPIC
BILIRUBIN URINE: NEGATIVE
Glucose, UA: NEGATIVE mg/dL
HGB URINE DIPSTICK: NEGATIVE
KETONES UR: NEGATIVE mg/dL
Nitrite: NEGATIVE
Protein, ur: NEGATIVE mg/dL
Specific Gravity, Urine: 1.008 (ref 1.005–1.030)
Urobilinogen, UA: 0.2 mg/dL (ref 0.0–1.0)
pH: 7.5 (ref 5.0–8.0)

## 2013-12-30 LAB — RAPID URINE DRUG SCREEN, HOSP PERFORMED
Amphetamines: NOT DETECTED
Barbiturates: NOT DETECTED
Benzodiazepines: NOT DETECTED
COCAINE: NOT DETECTED
OPIATES: NOT DETECTED
Tetrahydrocannabinol: NOT DETECTED

## 2013-12-30 LAB — PRO B NATRIURETIC PEPTIDE: PRO B NATRI PEPTIDE: 55.3 pg/mL (ref 0–125)

## 2013-12-30 LAB — TROPONIN I: Troponin I: <0.3 ng/mL (ref <0.30)

## 2013-12-30 MED ORDER — ALBUTEROL SULFATE HFA 108 (90 BASE) MCG/ACT IN AERS
2.0000 | INHALATION_SPRAY | RESPIRATORY_TRACT | Status: AC
  Administered 2013-12-30: 2 via RESPIRATORY_TRACT
  Filled 2013-12-30: qty 6.7

## 2013-12-30 MED ORDER — ALBUTEROL SULFATE (2.5 MG/3ML) 0.083% IN NEBU
5.0000 mg | INHALATION_SOLUTION | RESPIRATORY_TRACT | Status: AC
  Administered 2013-12-30: 5 mg via RESPIRATORY_TRACT
  Filled 2013-12-30: qty 6

## 2013-12-30 MED ORDER — LORAZEPAM 2 MG/ML IJ SOLN
1.0000 mg | Freq: Once | INTRAMUSCULAR | Status: AC
  Administered 2013-12-30: 1 mg via INTRAVENOUS
  Filled 2013-12-30: qty 1

## 2013-12-30 MED ORDER — BENZONATATE 100 MG PO CAPS: 100.0000 mg | ORAL_CAPSULE | Freq: Three times a day (TID) | ORAL | Status: DC

## 2013-12-30 MED ORDER — ALBUTEROL SULFATE (2.5 MG/3ML) 0.083% IN NEBU
5.0000 mg | INHALATION_SOLUTION | Freq: Once | RESPIRATORY_TRACT | Status: AC
  Administered 2013-12-30: 5 mg via RESPIRATORY_TRACT
  Filled 2013-12-30: qty 6

## 2013-12-30 NOTE — ED Notes (Signed)
Daughter Shakyia Bosso [phone] RT [phone] requested to be updated on pt's progress admission or discharge.

## 2013-12-30 NOTE — ED Notes (Signed)
Pt presents anxious, short of breadth, remarks on hx of CHF, states she was recently treated for flu-like symptoms, but not getting any better.

## 2013-12-30 NOTE — Discharge Instructions (Signed)
Please call your doctor for a followup appointment within 24-48 hours. When you talk to your doctor please let them know that you were seen in the emergency department and have them acquire all of your records so that they can discuss the findings with you and formulate a treatment plan to fully care for your new and ongoing problems.  Use the inhaler 2 puffs every 4 hours

## 2013-12-30 NOTE — ED Notes (Signed)
Per pt, has had cough congestion x several days.  Pt started having severe left chest pain today.  Was driving down road.  Called 911.  Was pulled over.  Ambulance wanted pt to come to ED.  Pt refused and called family to bring her.

## 2013-12-30 NOTE — ED Notes (Signed)
Notified MD of pt stress and chest pain.

## 2013-12-30 NOTE — ED Notes (Signed)
MD at bedside. 

## 2013-12-30 NOTE — ED Provider Notes (Signed)
CSN: [id number]     Arrival date & time 12/30/13  1541 History   First MD Initiated Contact with Patient 12/30/13 1548     Chief Complaint  Patient presents with  . Chest Pain  . Shortness of Breath     (Consider location/radiation/quality/duration/timing/severity/associated sxs/prior Treatment) HPI Comments: 53 y/o female, history of congestive heart failure, last echocardiogram was in 2013 and an ejection fraction of 50-55%. She also has a history of feeling short of breath chronically, she smokes daily, and she does have a history of a heart attack in 2003 and has had radiation therapy after breast cancer. She presents with one week of increased coughing and shortness of breath. This started out as a cough and mild shortness of breath has gradually worsened, he did not improve with Zithromax and prednisone and she found herself today with severe shortness of breath and coughing. She denies peripheral edema, she does endorse having some orthopnea but has had a normal appetite and normal ability to urinate. She denies diarrhea or rectal bleeding. At this time she has chest pain with coughing. The symptoms are severe, she has never had anything like this before. She denies being told she had a history of COPD despite chronic tobacco use.  Patient is a 53 y.o. female presenting with chest pain and shortness of breath. The history is provided by the patient.  Chest Pain Associated symptoms: shortness of breath   Shortness of Breath Associated symptoms: chest pain     Past Medical History  Diagnosis Date  . Depression   . Hypothyroidism   . Congestive heart failure   . Emphysema of lung   . GERD (gastroesophageal reflux disease)   . Heart murmur   . Anemia 05/19/2011  . Nonischemic cardiomyopathy     LVEF 38%. MUGA, 2010, 50-55% by ECHO 04/14/11. Cardiologist Dr. [name]  . Pneumonia   . History of bronchitis   . Shortness of breath 07/16/11    "at all times"  . Blood transfusion    . Chronic lower back pain   . Breast cancer 07/16/2011    left breast lumpectomy=invasive ductal ca,2. cm,high grade ductal ca in situ,11 benign lymph nodes  . Obesity   . Allergy   . Neuromuscular disorder     tingling left hand  . Myocardial infarction 2003  . Hyperlipidemia   . History of radiation therapy 09/06/11-11/24/11    left breast  60.4gray total dose  . Coronary artery disease     Negative cath 2001  . Anxiety   . Urticaria   . Other primary cardiomyopathies   . Chest pain, unspecified     Sharp midsternal chest pain at night with no radiation. Hard for her to take a deep breath in. NTG does help. Normal Myocardial perfusion study 05/14/11.  [name] Hypertension   . Chronic combined systolic and diastolic CHF (congestive heart failure)    Past Surgical History  Procedure Laterality Date  . Portacath placement  02/26/2011    Procedure: INSERTION PORT-A-CATH;  Surgeon: Harl Bowie, MD;  Location: [place];  Service: General;  Laterality: Right;  . Breast biopsy      left breast  . Breast lumpectomy  07/16/11    w/LND; left.ER/PR=positive  . Abdominal hysterectomy  ~2006    partial  . Tubal ligation  1991  . Port-a-cath removal N/A 07/31/2012    Procedure: REMOVAL PORT-A-CATH;  Surgeon: Harl Bowie, MD;  Location: [facility];  Service: General;  Laterality: N/A;  .  Cardiac catheterization  2001    negative for CAD   Family History  Problem Relation Age of Onset  . Leukemia Mother   . Heart disease Maternal Grandmother    History  Substance Use Topics  . Smoking status: Former Smoker -- 1.00 packs/day for 40 years    Types: Cigarettes    Quit date: 02/20/2011  . Smokeless tobacco: Never Used  . Alcohol Use: 0.0 oz/week     Comment: 07/16/11 "wine coolers; eve   OB History   Grav Para Term Preterm Abortions TAB SAB Ect Mult Living                 Review of Systems  Respiratory: Positive for shortness of breath.   Cardiovascular: Positive for chest pain.   All other systems reviewed and are negative.     Allergies  Aspirin; Docetaxel; Morphine and related; Penicillins; and Other  Home Medications   Prior to Admission medications   Medication Sig Start Date End Date Taking? Authorizing Provider  anastrozole (ARIMIDEX) 1 MG tablet Take 1 mg by mouth every morning.   Yes Historical Provider, MD  carvedilol (COREG) 25 MG tablet Take 25 mg by mouth 2 (two) times daily with a meal.    Yes Historical Provider, MD  furosemide (LASIX) 40 MG tablet Take 80 mg by mouth 2 (two) times daily.   Yes Historical Provider, MD  isosorbide-hydrALAZINE (BIDIL) 20-37.5 MG per tablet Take 1 tablet by mouth 3 (three) times daily.   Yes Historical Provider, MD  levothyroxine (SYNTHROID, LEVOTHROID) 88 MCG tablet Take 88 mcg by mouth daily before breakfast.   Yes Historical Provider, MD  lisinopril (PRINIVIL,ZESTRIL) 20 MG tablet Take 20 mg by mouth every morning.    Yes Historical Provider, MD  Melatonin 10 MG TABS Take 3 tablets by mouth at bedtime.   Yes Historical Provider, MD  Menthol, Topical Analgesic, (ZIMS MAX-FREEZE) 3.7 % GEL Apply 1 application topically daily as needed (for pain). Apply to back and legs   Yes Historical Provider, MD  nitroGLYCERIN (NITROSTAT) 0.4 MG SL tablet Place 0.4 mg under the tongue every 5 (five) minutes as needed. As needed for chest pain.   Yes Historical Provider, MD  omeprazole (PRILOSEC) 20 MG capsule Take 20 mg by mouth 2 (two) times daily.    Yes Historical Provider, MD  polyethylene glycol (MIRALAX / GLYCOLAX) packet Take 17 g by mouth daily as needed for moderate constipation.   Yes Historical Provider, MD  potassium chloride SA (K-DUR,KLOR-CON) 20 MEQ tablet Take 20 mEq by mouth 2 (two) times daily.   Yes Historical Provider, MD  zolpidem (AMBIEN) 10 MG tablet Take 10 mg by mouth at bedtime.    Yes Historical Provider, MD  benzonatate (TESSALON) 100 MG capsule Take 1 capsule (100 mg total) by mouth every 8 (eight) hours.  12/30/13   Johnna Acosta, MD   BP 126/86  Pulse 100  Temp(Src) 98.1 F (36.7 C) (Oral)  Resp 20  SpO2 93% Physical Exam  Nursing note and vitals reviewed. Constitutional: She appears well-developed and well-nourished. She appears distressed.  HENT:  Head: Normocephalic and atraumatic.  Mouth/Throat: Oropharynx is clear and moist. No oropharyngeal exudate.  Eyes: Conjunctivae and EOM are normal. Pupils are equal, round, and reactive to light. Right eye exhibits no discharge. Left eye exhibits no discharge. No scleral icterus.  Neck: Normal range of motion. Neck supple. No JVD present. No thyromegaly present.  Cardiovascular: Normal rate, regular rhythm, normal heart sounds  and intact distal pulses.  Exam reveals no gallop and no friction rub.   No murmur heard. Pulmonary/Chest: She is in respiratory distress. She has wheezes. She has no rales.  Diffuse expiratory wheezing, increased work of breathing, speaks in short sentences, no rales  Abdominal: Soft. Bowel sounds are normal. She exhibits no distension and no mass. There is no tenderness.  Musculoskeletal: Normal range of motion. She exhibits no edema and no tenderness.  Lymphadenopathy:    She has no cervical adenopathy.  Neurological: She is alert. Coordination normal.  Skin: Skin is warm and dry. No rash noted. No erythema.  Psychiatric: She has a normal mood and affect. Her behavior is normal.    ED Course  Procedures (including critical care time) Labs Review Labs Reviewed  BASIC METABOLIC PANEL - Abnormal; Notable for the following:    BUN 25 (*)    Creatinine, Ser 1.56 (*)    GFR calc non Af Amer 37 (*)    GFR calc Af Amer 43 (*)    Anion gap 17 (*)    All other components within normal limits  URINALYSIS, ROUTINE W REFLEX MICROSCOPIC - Abnormal; Notable for the following:    Leukocytes, UA TRACE (*)    All other components within normal limits  CBC WITH DIFFERENTIAL  PRO B NATRIURETIC PEPTIDE  TROPONIN I   URINE RAPID DRUG SCREEN (HOSP PERFORMED)  URINE MICROSCOPIC-ADD ON    Imaging Review Dg Chest 2 View  12/30/2013   CLINICAL DATA:  Chest pain.  Shortness of breath.  Hypertension.  EXAM: CHEST  2 VIEW  COMPARISON:  07/24/2012  FINDINGS: The heart size and mediastinal contours are within normal limits. Both lungs are clear. Surgical clips again seen in the left breast and axilla. Power port has been removed since prior study. The visualized skeletal structures are unremarkable.  IMPRESSION: No active cardiopulmonary disease.   Electronically Signed   By: Earle Gell M.D.   On: 12/30/2013 17:45     EKG Interpretation   Date/Time:  Sunday December 30 2013 15:49:07 EDT Ventricular Rate:  82 PR Interval:  150 QRS Duration: 96 QT Interval:  414 QTC Calculation: 483 R Axis:   23 Text Interpretation:  Sinus rhythm Probable left ventricular hypertrophy  Anterior Q waves, possibly due to LVH Abnormal ekg since last tracing no  significant change Confirmed by Rosamund Nyland  MD, Darrell Leonhardt ([id number]) on 12/30/2013  4:07:26 PM      MDM   Final diagnoses:  Bronchitis    The patient has no hypoxia, oxygen 98-100% on room air but she does have diffuse expiratory wheezing. In the situation of having a recently diagnosed upper respiratory infection this is likely a bronchitis, rule out pneumonia with x-ray, albuterol treatments, labs. EKG shows left ventricular hypertrophy but no other acute findings.  After receiving medications such as albuterol 5 mg and received a chest x-ray and blood work which were all normal the patient improved significantly improved dyspnea. She then became very tearful and started hallucinating that her grandmother was in the room. In the members are now here and states that she does not drink any alcohol, she is definitely hallucinating at this time, she does not appear to have any other signs of alcohol withdrawal, she does not have a history of psychosis per the family members  The  patient was given 1 mg of Ativan and had significant improvement in her mental status is back to normal, wheezing is minimal, vital signs remained normal, the  patient now states that she feels back to normal. This may have been related to the prednisone is now she states that she has not slept much in the last 3 days while taking the prednisone. She also states that her coughing is better and she feels comfortable going home  Meds given in ED:  Medications  albuterol (PROVENTIL) (2.5 MG/3ML) 0.083% nebulizer solution 5 mg (not administered)  albuterol (PROVENTIL HFA;VENTOLIN HFA) 108 (90 BASE) MCG/ACT inhaler 2 puff (not administered)  albuterol (PROVENTIL) (2.5 MG/3ML) 0.083% nebulizer solution 5 mg (5 mg Nebulization Given 12/30/13 1612)  LORazepam (ATIVAN) injection 1 mg (1 mg Intravenous Given 12/30/13 1900)    New Prescriptions   BENZONATATE (TESSALON) 100 MG CAPSULE    Take 1 capsule (100 mg total) by mouth every 8 (eight) hours.      Johnna Acosta, MD 12/30/13 [phone]

## 2013-12-31 ENCOUNTER — Emergency Department (HOSPITAL_COMMUNITY)
Admission: EM | Admit: 2013-12-31 | Discharge: 2013-12-31 | Discharge status: Against Medical Advice | Payer: Medicare Other

## 2014-01-02 ENCOUNTER — Emergency Department (HOSPITAL_COMMUNITY): Payer: Medicare Other

## 2014-01-02 ENCOUNTER — Emergency Department (HOSPITAL_COMMUNITY)
Admission: EM | Admit: 2014-01-02 | Discharge: 2014-01-02 | Disposition: A | Discharge status: Home / Self Care | Payer: Medicare Other | Attending: Emergency Medicine | Admitting: Emergency Medicine

## 2014-01-02 ENCOUNTER — Encounter (HOSPITAL_COMMUNITY): Payer: Self-pay | Admitting: Emergency Medicine

## 2014-01-02 DIAGNOSIS — E039 Hypothyroidism, unspecified: Secondary | ICD-10-CM | POA: Diagnosis not present

## 2014-01-02 DIAGNOSIS — R05 Cough: Secondary | ICD-10-CM | POA: Diagnosis present

## 2014-01-02 DIAGNOSIS — R0789 Other chest pain: Secondary | ICD-10-CM | POA: Insufficient documentation

## 2014-01-02 DIAGNOSIS — G8929 Other chronic pain: Secondary | ICD-10-CM | POA: Diagnosis not present

## 2014-01-02 DIAGNOSIS — R011 Cardiac murmur, unspecified: Secondary | ICD-10-CM | POA: Insufficient documentation

## 2014-01-02 DIAGNOSIS — Z853 Personal history of malignant neoplasm of breast: Secondary | ICD-10-CM | POA: Insufficient documentation

## 2014-01-02 DIAGNOSIS — J439 Emphysema, unspecified: Secondary | ICD-10-CM | POA: Insufficient documentation

## 2014-01-02 DIAGNOSIS — Z8701 Personal history of pneumonia (recurrent): Secondary | ICD-10-CM | POA: Diagnosis not present

## 2014-01-02 DIAGNOSIS — J40 Bronchitis, not specified as acute or chronic: Secondary | ICD-10-CM | POA: Insufficient documentation

## 2014-01-02 DIAGNOSIS — R0602 Shortness of breath: Secondary | ICD-10-CM | POA: Diagnosis not present

## 2014-01-02 DIAGNOSIS — J9801 Acute bronchospasm: Secondary | ICD-10-CM | POA: Insufficient documentation

## 2014-01-02 DIAGNOSIS — Z87891 Personal history of nicotine dependence: Secondary | ICD-10-CM | POA: Insufficient documentation

## 2014-01-02 DIAGNOSIS — I252 Old myocardial infarction: Secondary | ICD-10-CM | POA: Diagnosis not present

## 2014-01-02 DIAGNOSIS — I5042 Chronic combined systolic (congestive) and diastolic (congestive) heart failure: Secondary | ICD-10-CM | POA: Diagnosis not present

## 2014-01-02 DIAGNOSIS — E669 Obesity, unspecified: Secondary | ICD-10-CM | POA: Diagnosis not present

## 2014-01-02 DIAGNOSIS — Z9889 Other specified postprocedural states: Secondary | ICD-10-CM | POA: Insufficient documentation

## 2014-01-02 DIAGNOSIS — I10 Essential (primary) hypertension: Secondary | ICD-10-CM | POA: Diagnosis not present

## 2014-01-02 DIAGNOSIS — R110 Nausea: Secondary | ICD-10-CM | POA: Diagnosis not present

## 2014-01-02 DIAGNOSIS — Z8659 Personal history of other mental and behavioral disorders: Secondary | ICD-10-CM | POA: Diagnosis not present

## 2014-01-02 DIAGNOSIS — Z923 Personal history of irradiation: Secondary | ICD-10-CM | POA: Diagnosis not present

## 2014-01-02 DIAGNOSIS — J209 Acute bronchitis, unspecified: Secondary | ICD-10-CM | POA: Diagnosis not present

## 2014-01-02 DIAGNOSIS — I2510 Atherosclerotic heart disease of native coronary artery without angina pectoris: Secondary | ICD-10-CM | POA: Insufficient documentation

## 2014-01-02 DIAGNOSIS — Z862 Personal history of diseases of the blood and blood-forming organs and certain disorders involving the immune mechanism: Secondary | ICD-10-CM | POA: Insufficient documentation

## 2014-01-02 DIAGNOSIS — B349 Viral infection, unspecified: Secondary | ICD-10-CM

## 2014-01-02 DIAGNOSIS — Z880 Allergy status to penicillin: Secondary | ICD-10-CM | POA: Diagnosis not present

## 2014-01-02 MED ORDER — METHYLPREDNISOLONE SODIUM SUCC 125 MG IJ SOLR
125.0000 mg | Freq: Once | INTRAMUSCULAR | Status: AC
  Administered 2014-01-02: 125 mg via INTRAVENOUS
  Filled 2014-01-02: qty 2

## 2014-01-02 MED ORDER — CETIRIZINE HCL 10 MG PO CAPS: 10.0000 mg | ORAL_CAPSULE | Freq: Every evening | ORAL | Status: DC | PRN

## 2014-01-02 MED ORDER — PREDNISONE 10 MG PO TABS: 40.0000 mg | ORAL_TABLET | Freq: Every day | ORAL | Status: DC

## 2014-01-02 MED ORDER — IPRATROPIUM-ALBUTEROL 0.5-2.5 (3) MG/3ML IN SOLN
3.0000 mL | Freq: Once | RESPIRATORY_TRACT | Status: AC
  Administered 2014-01-02: 3 mL via RESPIRATORY_TRACT
  Filled 2014-01-02: qty 3

## 2014-01-02 NOTE — Discharge Instructions (Signed)
Acute Bronchitis Bronchitis is inflammation of the airways that extend from the windpipe into the lungs (bronchi). The inflammation often causes mucus to develop. This leads to a cough, which is the most common symptom of bronchitis.  In acute bronchitis, the condition usually develops suddenly and goes away over time, usually in a couple weeks. Smoking, allergies, and asthma can make bronchitis worse. Repeated episodes of bronchitis may cause further lung problems.  CAUSES Acute bronchitis is most often caused by the same virus that causes a cold. The virus can spread from person to person (contagious) through coughing, sneezing, and touching contaminated objects. SIGNS AND SYMPTOMS   Cough.   Fever.   Coughing up mucus.   Body aches.   Chest congestion.   Chills.   Shortness of breath.   Sore throat.  DIAGNOSIS  Acute bronchitis is usually diagnosed through a physical exam. Your health care provider will also ask you questions about your medical history. Tests, such as chest X-rays, are sometimes done to rule out other conditions.  TREATMENT  Acute bronchitis usually goes away in a couple weeks. Oftentimes, no medical treatment is necessary. Medicines are sometimes given for relief of fever or cough. Antibiotic medicines are usually not needed but may be prescribed in certain situations. In some cases, an inhaler may be recommended to help reduce shortness of breath and control the cough. A cool mist vaporizer may also be used to help thin bronchial secretions and make it easier to clear the chest.  HOME CARE INSTRUCTIONS  Get plenty of rest.   Drink enough fluids to keep your urine clear or pale yellow (unless you have a medical condition that requires fluid restriction). Increasing fluids may help thin your respiratory secretions (sputum) and reduce chest congestion, and it will prevent dehydration.   Take medicines only as directed by your health care provider.  If  you were prescribed an antibiotic medicine, finish it all even if you start to feel better.  Avoid smoking and secondhand smoke. Exposure to cigarette smoke or irritating chemicals will make bronchitis worse. If you are a smoker, consider using nicotine gum or skin patches to help control withdrawal symptoms. Quitting smoking will help your lungs heal faster.   Reduce the chances of another bout of acute bronchitis by washing your hands frequently, avoiding people with cold symptoms, and trying not to touch your hands to your mouth, nose, or eyes.   Keep all follow-up visits as directed by your health care provider.  SEEK MEDICAL CARE IF: Your symptoms do not improve after 1 week of treatment.  SEEK IMMEDIATE MEDICAL CARE IF:  You develop an increased fever or chills.   You have chest pain.   You have severe shortness of breath.  You have bloody sputum.   You develop dehydration.  You faint or repeatedly feel like you are going to pass out.  You develop repeated vomiting.  You develop a severe headache. MAKE SURE YOU:   Understand these instructions.  Will watch your condition.  Will get help right away if you are not doing well or get worse. Document Released: 04/15/2004 Document Revised: 07/23/2013 Document Reviewed: 08/29/2012 [facility] Patient Information 2015 [place], [place]. This information is not intended to replace advice given to you by your health care provider. Make sure you discuss any questions you have with your health care provider. Bronchospasm A bronchospasm is a spasm or tightening of the airways going into the lungs. During a bronchospasm breathing becomes more difficult because the  airways get smaller. When this happens there can be coughing, a whistling sound when breathing (wheezing), and difficulty breathing. Bronchospasm is often associated with asthma, but not all patients who experience a bronchospasm have asthma. CAUSES  A bronchospasm is caused  by inflammation or irritation of the airways. The inflammation or irritation may be triggered by:   Allergies (such as to animals, pollen, food, or mold). Allergens that cause bronchospasm may cause wheezing immediately after exposure or many hours later.   Infection. Viral infections are believed to be the most common cause of bronchospasm.   Exercise.   Irritants (such as pollution, cigarette smoke, strong odors, aerosol sprays, and paint fumes).   Weather changes. Winds increase molds and pollens in the air. Rain refreshes the air by washing irritants out. Cold air may cause inflammation.   Stress and emotional upset.  SIGNS AND SYMPTOMS   Wheezing.   Excessive nighttime coughing.   Frequent or severe coughing with a simple cold.   Chest tightness.   Shortness of breath.  DIAGNOSIS  Bronchospasm is usually diagnosed through a history and physical exam. Tests, such as chest X-rays, are sometimes done to look for other conditions. TREATMENT   Inhaled medicines can be given to open up your airways and help you breathe. The medicines can be given using either an inhaler or a nebulizer machine.  Corticosteroid medicines may be given for severe bronchospasm, usually when it is associated with asthma. HOME CARE INSTRUCTIONS   Always have a plan prepared for seeking medical care. Know when to call your health care provider and local emergency services (911 in the [place] Know where you can access local emergency care.  Only take medicines as directed by your health care provider.  If you were prescribed an inhaler or nebulizer machine, ask your health care provider to explain how to use it correctly. Always use a spacer with your inhaler if you were given one.  It is necessary to remain calm during an attack. Try to relax and breathe more slowly.  Control your home environment in the following ways:   Change your heating and air conditioning filter at least once a  month.   Limit your use of fireplaces and wood stoves.  Do not smoke and do not allow smoking in your home.   Avoid exposure to perfumes and fragrances.   Get rid of pests (such as roaches and mice) and their droppings.   Throw away plants if you see mold on them.   Keep your house clean and dust free.   Replace carpet with wood, tile, or vinyl flooring. Carpet can trap dander and dust.   Use allergy-proof pillows, mattress covers, and box spring covers.   Wash bed sheets and blankets every week in hot water and dry them in a dryer.   Use blankets that are made of polyester or cotton.   Wash hands frequently. SEEK MEDICAL CARE IF:   You have muscle aches.   You have chest pain.   The sputum changes from clear or white to yellow, green, gray, or bloody.   The sputum you cough up gets thicker.   There are problems that may be related to the medicine you are given, such as a rash, itching, swelling, or trouble breathing.  SEEK IMMEDIATE MEDICAL CARE IF:   You have worsening wheezing and coughing even after taking your prescribed medicines.   You have increased difficulty breathing.   You develop severe chest pain. MAKE SURE YOU:  Understand these instructions.  Will watch your condition.  Will get help right away if you are not doing well or get worse. Document Released: 03/11/2003 Document Revised: 03/13/2013 Document Reviewed: 08/28/2012 [facility] Patient Information 2015 [place], [place]. This information is not intended to replace advice given to you by your health care provider. Make sure you discuss any questions you have with your health care provider.

## 2014-01-02 NOTE — ED Provider Notes (Signed)
CSN: [id number]     Arrival date & time 01/02/14  1745 History   First MD Initiated Contact with Patient 01/02/14 1816     Chief Complaint  Patient presents with  . Cough  . Generalized Body Aches   Patient is a 53 y.o. female presenting with cough.  Cough Associated symptoms: chest pain (only with coughing), shortness of breath and wheezing   Associated symptoms: no chills and no fever     Patient is a 53 y.o. Female with PMH of CHF, emphysema, and asthma who presents to the ED with cough and wheezing.  Per the patient she has been coughing for the past two weeks.  She states that her cough is non-productive and paroxysmal in nature.  She states that sometimes that she is coughing so hard she feels like she is going to vomit.  Patient states that her cough is associated with wheezing, congestion, runny nose, chest pain only when coughing, and shortness of breath after coughing.  Patient states that thus far she has been treated with a zpack, steroids, an albuterol inhaler, tessalon pearls.  Patient states that nothing is helping.  Her grandchildren have similar symptoms.  Patient is also concerned that she has this due to black mold that was found in her house.  Patient was seen here two days ago an had a normal CXR and unremarkable lab work.  Past Medical History  Diagnosis Date  . Depression   . Hypothyroidism   . Congestive heart failure   . Emphysema of lung   . GERD (gastroesophageal reflux disease)   . Heart murmur   . Anemia 05/19/2011  . Nonischemic cardiomyopathy     LVEF 38%. MUGA, 2010, 50-55% by ECHO 04/14/11. Cardiologist Dr. [name]  . Pneumonia   . History of bronchitis   . Shortness of breath 07/16/11    "at all times"  . Blood transfusion   . Chronic lower back pain   . Breast cancer 07/16/2011    left breast lumpectomy=invasive ductal ca,2. cm,high grade ductal ca in situ,11 benign lymph nodes  . Obesity   . Allergy   . Neuromuscular disorder     tingling  left hand  . Myocardial infarction 2003  . Hyperlipidemia   . History of radiation therapy 09/06/11-11/24/11    left breast  60.4gray total dose  . Coronary artery disease     Negative cath 2001  . Anxiety   . Urticaria   . Other primary cardiomyopathies   . Chest pain, unspecified     Sharp midsternal chest pain at night with no radiation. Hard for her to take a deep breath in. NTG does help. Normal Myocardial perfusion study 05/14/11.  [name] Hypertension   . Chronic combined systolic and diastolic CHF (congestive heart failure)    Past Surgical History  Procedure Laterality Date  . Portacath placement  02/26/2011    Procedure: INSERTION PORT-A-CATH;  Surgeon: Harl Bowie, MD;  Location: [facility];  Service: General;  Laterality: Right;  . Breast biopsy      left breast  . Breast lumpectomy  07/16/11    w/LND; left.ER/PR=positive  . Abdominal hysterectomy  ~2006    partial  . Tubal ligation  1991  . Port-a-cath removal N/A 07/31/2012    Procedure: REMOVAL PORT-A-CATH;  Surgeon: Harl Bowie, MD;  Location: [facility];  Service: General;  Laterality: N/A;  . Cardiac catheterization  2001    negative for CAD   Family History  Problem  Relation Age of Onset  . Leukemia Mother   . Heart disease Maternal Grandmother    History  Substance Use Topics  . Smoking status: Former Smoker -- 1.00 packs/day for 40 years    Types: Cigarettes    Quit date: 02/20/2011  . Smokeless tobacco: Never Used  . Alcohol Use: 0.0 oz/week     Comment: 07/16/11 "wine coolers; eve   OB History   Grav Para Term Preterm Abortions TAB SAB Ect Mult Living                 Review of Systems  Constitutional: Negative for fever, chills and fatigue.  Respiratory: Positive for cough, chest tightness, shortness of breath and wheezing.   Cardiovascular: Positive for chest pain (only with coughing). Negative for palpitations and leg swelling.  Gastrointestinal: Positive for nausea. Negative for vomiting,  diarrhea, constipation and blood in stool.  Genitourinary: Negative for dysuria, urgency, frequency, hematuria and difficulty urinating.  All other systems reviewed and are negative.     Allergies  Aspirin; Docetaxel; Morphine and related; Penicillins; Dairy aid; Other; and Peanuts  Home Medications   Prior to Admission medications   Medication Sig Start Date End Date Taking? Authorizing Provider  albuterol (PROVENTIL HFA;VENTOLIN HFA) 108 (90 BASE) MCG/ACT inhaler Inhale 2 puffs into the lungs every 6 (six) hours as needed for wheezing or shortness of breath.   Yes Historical Provider, MD  anastrozole (ARIMIDEX) 1 MG tablet Take 1 mg by mouth every morning.   Yes Historical Provider, MD  benzonatate (TESSALON) 100 MG capsule Take 200 mg by mouth 3 (three) times daily as needed for cough (cough).   Yes Historical Provider, MD  carvedilol (COREG) 25 MG tablet Take 25 mg by mouth 2 (two) times daily with a meal.    Yes Historical Provider, MD  diphenhydramine-acetaminophen (TYLENOL PM) 25-500 MG TABS Take 3 tablets by mouth at bedtime as needed (sleep apnea).   Yes Historical Provider, MD  furosemide (LASIX) 40 MG tablet Take 80 mg by mouth 2 (two) times daily.   Yes Historical Provider, MD  isosorbide-hydrALAZINE (BIDIL) 20-37.5 MG per tablet Take 1 tablet by mouth 3 (three) times daily.   Yes Historical Provider, MD  levothyroxine (SYNTHROID, LEVOTHROID) 88 MCG tablet Take 88 mcg by mouth daily before breakfast.   Yes Historical Provider, MD  lisinopril (PRINIVIL,ZESTRIL) 20 MG tablet Take 20 mg by mouth every morning.    Yes Historical Provider, MD  Melatonin 10 MG TABS Take 3 tablets by mouth at bedtime.   Yes Historical Provider, MD  Menthol, Topical Analgesic, (ZIMS MAX-FREEZE) 3.7 % GEL Apply 1 application topically daily as needed (for pain). Apply to back and legs   Yes Historical Provider, MD  nitroGLYCERIN (NITROSTAT) 0.4 MG SL tablet Place 0.4 mg under the tongue every 5 (five)  minutes as needed. As needed for chest pain.   Yes Historical Provider, MD  omeprazole (PRILOSEC) 20 MG capsule Take 20 mg by mouth 2 (two) times daily.    Yes Historical Provider, MD  polyethylene glycol (MIRALAX / GLYCOLAX) packet Take 17 g by mouth daily as needed for moderate constipation.   Yes Historical Provider, MD  zolpidem (AMBIEN) 10 MG tablet Take 10 mg by mouth at bedtime.    Yes Historical Provider, MD  Cetirizine HCl (ZYRTEC ALLERGY) 10 MG CAPS Take 1 capsule (10 mg total) by mouth at bedtime as needed (congestion). 01/02/14   Julieann Drummonds A Forcucci, PA-C  predniSONE (DELTASONE) 10 MG tablet Take  4 tablets (40 mg total) by mouth daily. 01/02/14   Hannibal Skalla A Forcucci, PA-C   BP 112/46  Pulse 66  Temp(Src) 98.2 F (36.8 C) (Oral)  Resp 16  SpO2 98% Physical Exam  Nursing note and vitals reviewed. Constitutional: She is oriented to person, place, and time. She appears well-developed and well-nourished. No distress.  HENT:  Head: Normocephalic and atraumatic.  Mouth/Throat: Oropharynx is clear and moist. No oropharyngeal exudate.  Eyes: Conjunctivae and EOM are normal. Pupils are equal, round, and reactive to light. No scleral icterus.  Neck: Normal range of motion. Neck supple. No JVD present. No thyromegaly present.  Cardiovascular: Normal rate, regular rhythm, normal heart sounds and intact distal pulses.  Exam reveals no gallop and no friction rub.   No murmur heard. Pulmonary/Chest: Effort normal. No respiratory distress. She has wheezes (diffuse wheeze in all lung fields). She has no rales. She exhibits tenderness.  Abdominal: Soft. Bowel sounds are normal. She exhibits no distension and no mass. There is no tenderness. There is no rebound and no guarding.  Musculoskeletal: Normal range of motion.  Lymphadenopathy:    She has no cervical adenopathy.  Neurological: She is alert and oriented to person, place, and time. She has normal strength. No cranial nerve deficit or  sensory deficit. Coordination normal.  Skin: Skin is warm and dry. She is not diaphoretic.  Psychiatric: She has a normal mood and affect. Her behavior is normal. Judgment and thought content normal.    ED Course  Procedures (including critical care time) Labs Review Labs Reviewed - No data to display  Imaging Review Dg Chest 2 View  01/02/2014   CLINICAL DATA:  Cough, shortness of Breath, generalized body ache  EXAM: CHEST  2 VIEW  COMPARISON:  12/30/2013  FINDINGS: Cardiomediastinal silhouette is stable. Surgical clips in left axilla again noted. No acute infiltrate or pleural effusion. No pulmonary edema. Mild degenerative changes thoracic spine.  IMPRESSION: No active cardiopulmonary disease.   Electronically Signed   By: Lahoma Crocker M.D.   On: 01/02/2014 20:17     EKG Interpretation None      MDM   Final diagnoses:  Bronchitis  Acute bronchospasm due to viral infection   Patient is a 53 y.o. Female who presents to the ED with cough, myalgias, and wheezing.  Physical exam reveals alert non-toxic patient with diffuse wheezing in all lung fields.  Patient declined repeat blood work.  Review of visit on 12/30/13 reveals no evidence of CHF exacerbation or PNA.  CXR repeated today and reveals no signficant change.  Patient treated here with 125 mg of solumedrol and 2 duoneb treatments.  Will discharge the patient home with prednisone.  Have discussed symptomatic care for patient at home.  Patient to return for fever, shortness of breath, and any other concerning symptoms.  Patient to follow-up with her PCP in 1 week.  Patient seen by and discussed with Dr. Tomi Bamberger who agrees with the above plan and workup.     Cherylann Parr, PA-C 01/02/14 2153

## 2014-01-02 NOTE — ED Provider Notes (Signed)
Medical screening examination/treatment/procedure(s) were conducted as a shared visit with non-physician practitioner(s) and myself.  I personally evaluated the patient during the encounter.  Pt presented with recurrent cough, wheezing.  On my exam after treatment, no wheezing.  Will dc home on oral steroids.  Continue her medications.  Dorie Rank, MD 01/02/14 2211

## 2014-01-02 NOTE — ED Notes (Signed)
D/c IV upon discharge

## 2014-01-02 NOTE — ED Notes (Signed)
Pt c/o cough and sinus infection like symptoms for 2 weeks. Pt states that she has been seen here and states that all her blood work and xray were good but pt didn't know til after she left here that she has black mold in her home.

## 2014-01-07 DIAGNOSIS — K219 Gastro-esophageal reflux disease without esophagitis: Secondary | ICD-10-CM | POA: Diagnosis not present

## 2014-01-07 DIAGNOSIS — F329 Major depressive disorder, single episode, unspecified: Secondary | ICD-10-CM | POA: Diagnosis not present

## 2014-01-07 DIAGNOSIS — M179 Osteoarthritis of knee, unspecified: Secondary | ICD-10-CM | POA: Diagnosis not present

## 2014-01-07 DIAGNOSIS — I10 Essential (primary) hypertension: Secondary | ICD-10-CM | POA: Diagnosis not present

## 2014-01-07 DIAGNOSIS — I509 Heart failure, unspecified: Secondary | ICD-10-CM | POA: Diagnosis not present

## 2014-01-07 DIAGNOSIS — J209 Acute bronchitis, unspecified: Secondary | ICD-10-CM | POA: Diagnosis not present

## 2014-01-07 DIAGNOSIS — G4700 Insomnia, unspecified: Secondary | ICD-10-CM | POA: Diagnosis not present

## 2014-01-07 DIAGNOSIS — E039 Hypothyroidism, unspecified: Secondary | ICD-10-CM | POA: Diagnosis not present

## 2014-01-14 DIAGNOSIS — M179 Osteoarthritis of knee, unspecified: Secondary | ICD-10-CM | POA: Diagnosis not present

## 2014-01-14 DIAGNOSIS — K219 Gastro-esophageal reflux disease without esophagitis: Secondary | ICD-10-CM | POA: Diagnosis not present

## 2014-01-14 DIAGNOSIS — I509 Heart failure, unspecified: Secondary | ICD-10-CM | POA: Diagnosis not present

## 2014-01-14 DIAGNOSIS — I10 Essential (primary) hypertension: Secondary | ICD-10-CM | POA: Diagnosis not present

## 2014-01-14 DIAGNOSIS — C50919 Malignant neoplasm of unspecified site of unspecified female breast: Secondary | ICD-10-CM | POA: Diagnosis not present

## 2014-01-14 DIAGNOSIS — D509 Iron deficiency anemia, unspecified: Secondary | ICD-10-CM | POA: Diagnosis not present

## 2014-01-14 DIAGNOSIS — E039 Hypothyroidism, unspecified: Secondary | ICD-10-CM | POA: Diagnosis not present

## 2014-01-14 DIAGNOSIS — F329 Major depressive disorder, single episode, unspecified: Secondary | ICD-10-CM | POA: Diagnosis not present

## 2014-01-18 DIAGNOSIS — G4700 Insomnia, unspecified: Secondary | ICD-10-CM | POA: Diagnosis not present

## 2014-01-18 DIAGNOSIS — M179 Osteoarthritis of knee, unspecified: Secondary | ICD-10-CM | POA: Diagnosis not present

## 2014-01-18 DIAGNOSIS — I10 Essential (primary) hypertension: Secondary | ICD-10-CM | POA: Diagnosis not present

## 2014-01-18 DIAGNOSIS — K219 Gastro-esophageal reflux disease without esophagitis: Secondary | ICD-10-CM | POA: Diagnosis not present

## 2014-01-18 DIAGNOSIS — C50919 Malignant neoplasm of unspecified site of unspecified female breast: Secondary | ICD-10-CM | POA: Diagnosis not present

## 2014-01-18 DIAGNOSIS — E039 Hypothyroidism, unspecified: Secondary | ICD-10-CM | POA: Diagnosis not present

## 2014-01-18 DIAGNOSIS — F329 Major depressive disorder, single episode, unspecified: Secondary | ICD-10-CM | POA: Diagnosis not present

## 2014-01-18 DIAGNOSIS — I890 Lymphedema, not elsewhere classified: Secondary | ICD-10-CM | POA: Diagnosis not present

## 2014-02-20 DIAGNOSIS — Z79899 Other long term (current) drug therapy: Secondary | ICD-10-CM | POA: Diagnosis not present

## 2014-02-20 DIAGNOSIS — G8929 Other chronic pain: Secondary | ICD-10-CM | POA: Diagnosis not present

## 2014-02-20 DIAGNOSIS — M179 Osteoarthritis of knee, unspecified: Secondary | ICD-10-CM | POA: Diagnosis not present

## 2014-03-04 ENCOUNTER — Other Ambulatory Visit: Payer: Self-pay | Admitting: *Deleted

## 2014-03-04 MED ORDER — ANASTROZOLE 1 MG PO TABS: 1.0000 mg | ORAL_TABLET | Freq: Every day | ORAL | Status: DC

## 2014-03-05 ENCOUNTER — Ambulatory Visit (HOSPITAL_COMMUNITY): Payer: Medicare Other

## 2014-03-12 DIAGNOSIS — D509 Iron deficiency anemia, unspecified: Secondary | ICD-10-CM | POA: Diagnosis not present

## 2014-03-12 DIAGNOSIS — E039 Hypothyroidism, unspecified: Secondary | ICD-10-CM | POA: Diagnosis not present

## 2014-03-12 DIAGNOSIS — I10 Essential (primary) hypertension: Secondary | ICD-10-CM | POA: Diagnosis not present

## 2014-03-12 DIAGNOSIS — J209 Acute bronchitis, unspecified: Secondary | ICD-10-CM | POA: Diagnosis not present

## 2014-03-12 DIAGNOSIS — K219 Gastro-esophageal reflux disease without esophagitis: Secondary | ICD-10-CM | POA: Diagnosis not present

## 2014-03-12 DIAGNOSIS — M179 Osteoarthritis of knee, unspecified: Secondary | ICD-10-CM | POA: Diagnosis not present

## 2014-03-12 DIAGNOSIS — I890 Lymphedema, not elsewhere classified: Secondary | ICD-10-CM | POA: Diagnosis not present

## 2014-03-12 DIAGNOSIS — G4700 Insomnia, unspecified: Secondary | ICD-10-CM | POA: Diagnosis not present

## 2014-03-29 IMAGING — CT CT CHEST W/O CM
2 of 4 series · 15 of 36 positions shown, 18 images · non-contrast
Comparison: Chest CT - 02/24/2011; PET-CT -02/24/2011 ; chest
radiograph -07/24/2012

CLINICAL DATA: History of breast cancer post XRT and chemotherapy,
now with acute left upper extremity edema and shortness of breath,
evaluate for disease recurrence

EXAM:
CT CHEST WITHOUT CONTRAST
TECHNIQUE: Multidetector CT imaging of the chest was performed following the
standard protocol without IV contrast.

[Series 2: chest w/o st · axial · non-contrast · 0.74mm/px · z∈[-433,-178]mm · 12 of 61 slices shown, 15 images]
[im 5/61  mediastinal]
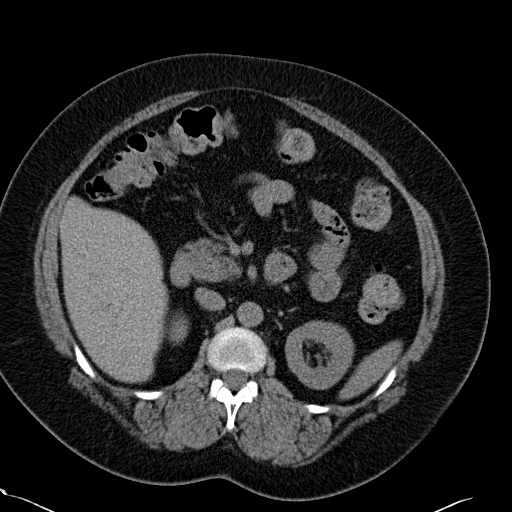
[im 5/61  lung]
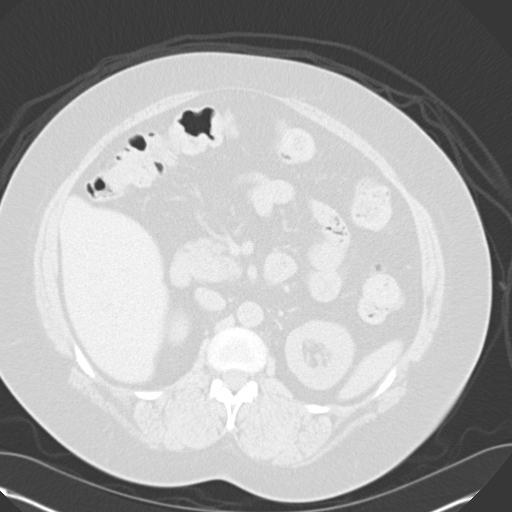
[im 9/61  lung]
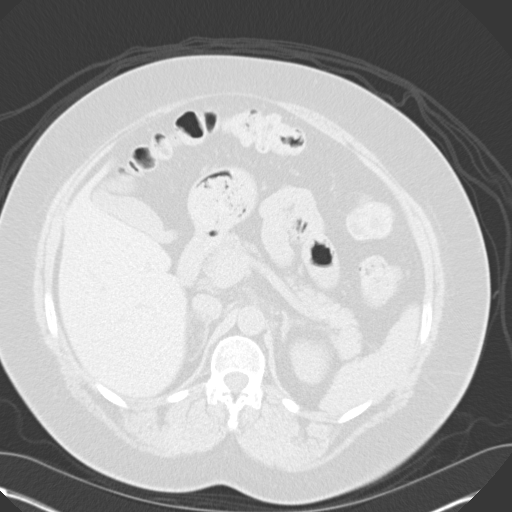
[im 13/61  lung]
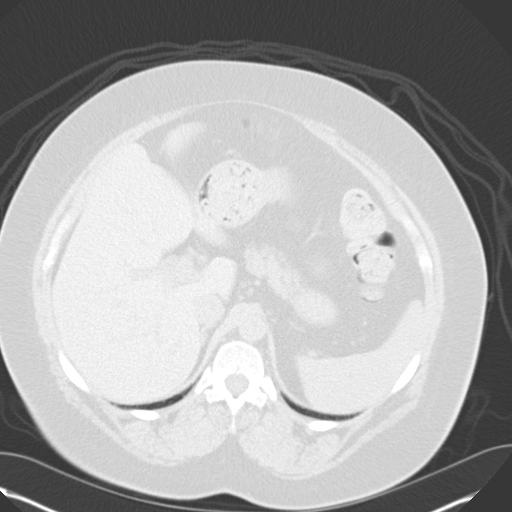
[im 18/61  lung]
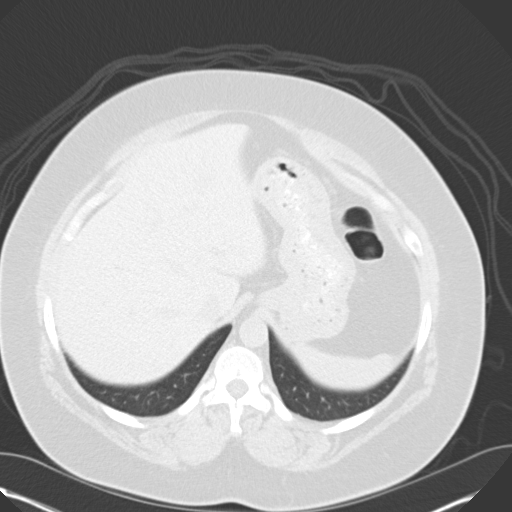
[im 22/61  mediastinal]
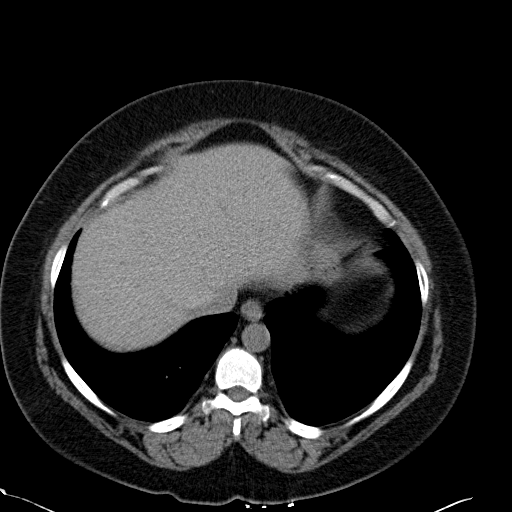
[im 22/61  lung]
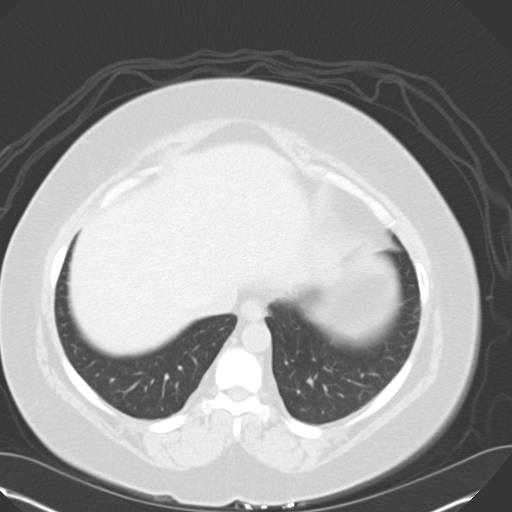
[im 26/61  lung]
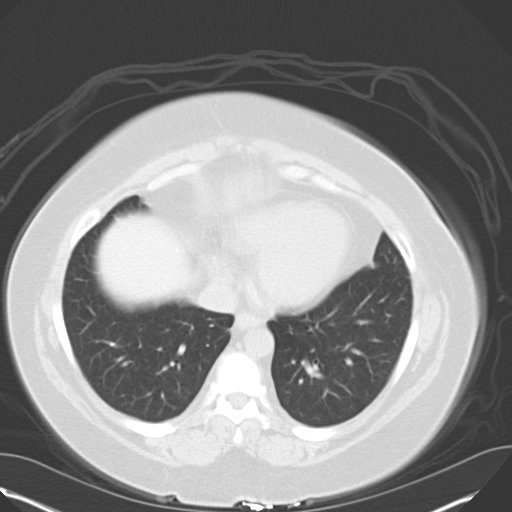
[im 35/61  lung]
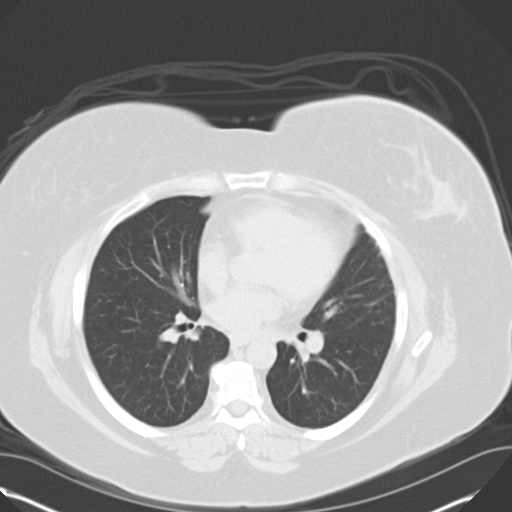
[im 39/61  lung]
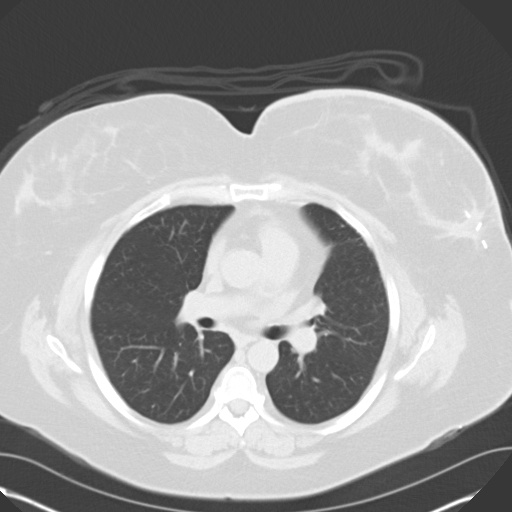
[im 43/61  mediastinal]
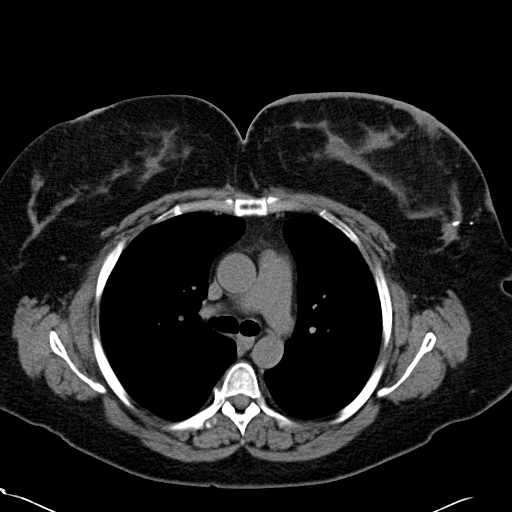
[im 43/61  lung]
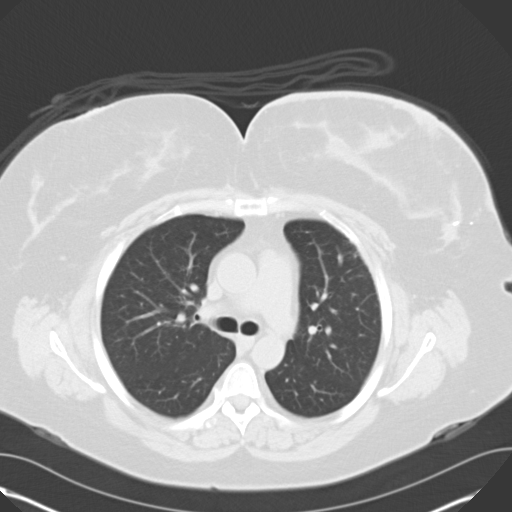
[im 48/61  lung]
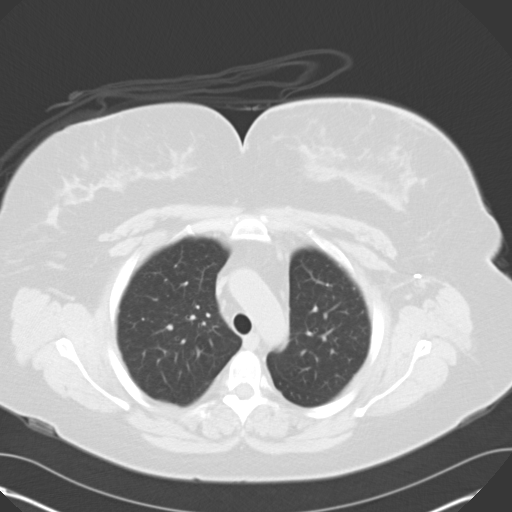
[im 52/61  lung]
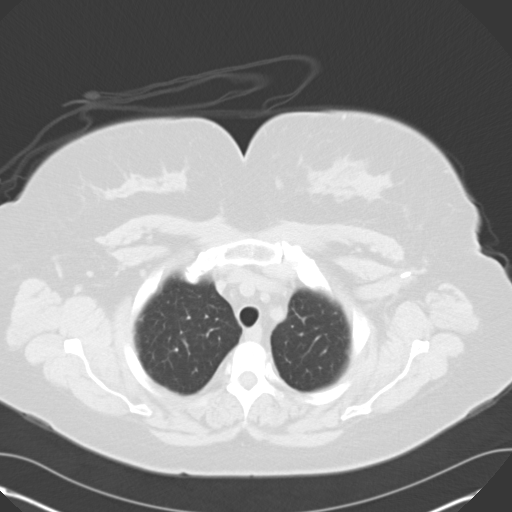
[im 56/61  lung]
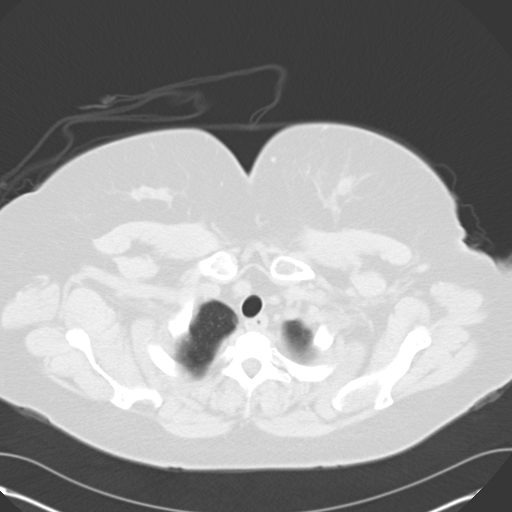

[Series 602: cor · coronal · 0.74mm/px · 3 of 95 slices shown]
[im 19/95  lung]
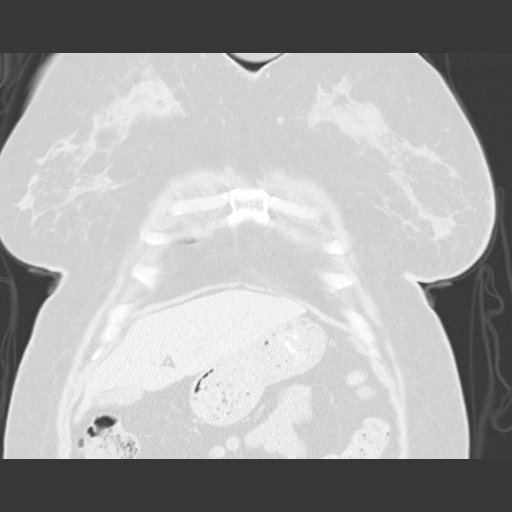
[im 38/95  lung]
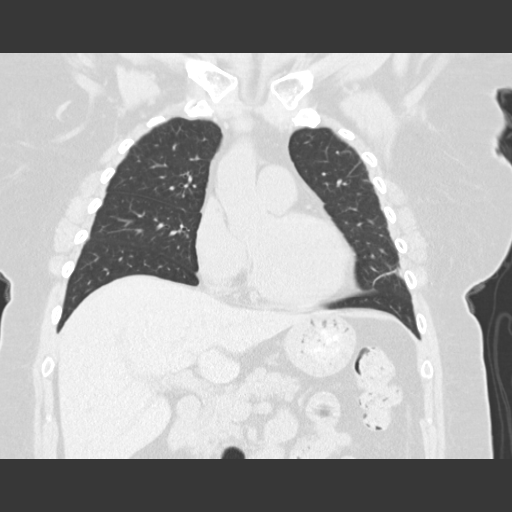
[im 57/95  lung]
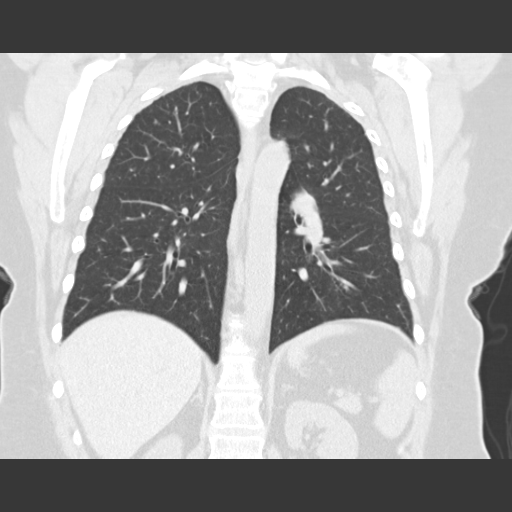

[15 of 36 positions shown; findings below may reference images not displayed]

FINDINGS: There are very minimal asymmetry subpleural reticular opacities
within the peripheral aspect of the left upper lobe and lingula,
possibly the sequela of provided history of prior radiation therapy.
No focal airspace opacities. No pleural effusion or pneumothorax. No
discrete pulmonary nodules. The central pulmonary airways are widely
patent.

Scattered shotty mediastinal lymph nodes are individually not
enlarged by size criteria with index AP window lymph node measuring
0.5 cm in greatest short axis diameter (image 21, series 2, not
enlarged by size criteria and grossly unchanged since the 6156
examination. No definite hilar adenopathy on this noncontrast
examination. Interval resolution of previously noted left axillary
lymphadenopathy.

Limited noncontrast evaluation of the upper abdomen is normal.

Interval removal of previously noted right subclavian vein approach
port a catheter.

There are surgical clips within the upper outer quadrant of the left
breast as well as the left axilla compatible with provided history
of breast cancer.

Regional soft tissues are otherwise normal. No acute or aggressive
osseous lesions.
IMPRESSION: 1. No acute cardiopulmonary disease on this noncontrast examination.
Specifically, No definite evidence metastatic disease to the chest.
If concern remains for acute left upper extremity edema, further
evaluation could be performed with left upper extremity venous
Doppler ultrasound as clinically indicated.
2. Postoperative change of the left breast and left axilla
compatible with provided history of breast cancer.

## 2014-04-09 DIAGNOSIS — I10 Essential (primary) hypertension: Secondary | ICD-10-CM | POA: Diagnosis not present

## 2014-04-09 DIAGNOSIS — F329 Major depressive disorder, single episode, unspecified: Secondary | ICD-10-CM | POA: Diagnosis not present

## 2014-04-09 DIAGNOSIS — I890 Lymphedema, not elsewhere classified: Secondary | ICD-10-CM | POA: Diagnosis not present

## 2014-04-09 DIAGNOSIS — C50919 Malignant neoplasm of unspecified site of unspecified female breast: Secondary | ICD-10-CM | POA: Diagnosis not present

## 2014-04-09 DIAGNOSIS — M545 Low back pain: Secondary | ICD-10-CM | POA: Diagnosis not present

## 2014-04-09 DIAGNOSIS — K219 Gastro-esophageal reflux disease without esophagitis: Secondary | ICD-10-CM | POA: Diagnosis not present

## 2014-04-09 DIAGNOSIS — Z6841 Body Mass Index (BMI) 40.0 and over, adult: Secondary | ICD-10-CM | POA: Diagnosis not present

## 2014-04-09 DIAGNOSIS — G4700 Insomnia, unspecified: Secondary | ICD-10-CM | POA: Diagnosis not present

## 2014-04-09 DIAGNOSIS — M179 Osteoarthritis of knee, unspecified: Secondary | ICD-10-CM | POA: Diagnosis not present

## 2014-04-09 DIAGNOSIS — I509 Heart failure, unspecified: Secondary | ICD-10-CM | POA: Diagnosis not present

## 2014-04-13 ENCOUNTER — Other Ambulatory Visit: Payer: Self-pay | Admitting: Oncology

## 2014-04-13 DIAGNOSIS — C50412 Malignant neoplasm of upper-outer quadrant of left female breast: Secondary | ICD-10-CM

## 2014-04-19 DIAGNOSIS — I5042 Chronic combined systolic (congestive) and diastolic (congestive) heart failure: Secondary | ICD-10-CM | POA: Diagnosis not present

## 2014-04-19 DIAGNOSIS — E662 Morbid (severe) obesity with alveolar hypoventilation: Secondary | ICD-10-CM | POA: Diagnosis not present

## 2014-04-19 DIAGNOSIS — I429 Cardiomyopathy, unspecified: Secondary | ICD-10-CM | POA: Diagnosis not present

## 2014-04-19 DIAGNOSIS — I10 Essential (primary) hypertension: Secondary | ICD-10-CM | POA: Diagnosis not present

## 2014-05-07 ENCOUNTER — Other Ambulatory Visit: Payer: Medicare Other

## 2014-05-09 DIAGNOSIS — F329 Major depressive disorder, single episode, unspecified: Secondary | ICD-10-CM | POA: Diagnosis not present

## 2014-05-09 DIAGNOSIS — E069 Thyroiditis, unspecified: Secondary | ICD-10-CM | POA: Diagnosis not present

## 2014-05-09 DIAGNOSIS — Z6841 Body Mass Index (BMI) 40.0 and over, adult: Secondary | ICD-10-CM | POA: Diagnosis not present

## 2014-05-09 DIAGNOSIS — G4700 Insomnia, unspecified: Secondary | ICD-10-CM | POA: Diagnosis not present

## 2014-05-09 DIAGNOSIS — I10 Essential (primary) hypertension: Secondary | ICD-10-CM | POA: Diagnosis not present

## 2014-05-09 DIAGNOSIS — D509 Iron deficiency anemia, unspecified: Secondary | ICD-10-CM | POA: Diagnosis not present

## 2014-05-09 DIAGNOSIS — C50919 Malignant neoplasm of unspecified site of unspecified female breast: Secondary | ICD-10-CM | POA: Diagnosis not present

## 2014-05-09 DIAGNOSIS — K219 Gastro-esophageal reflux disease without esophagitis: Secondary | ICD-10-CM | POA: Diagnosis not present

## 2014-05-09 DIAGNOSIS — I509 Heart failure, unspecified: Secondary | ICD-10-CM | POA: Diagnosis not present

## 2014-05-09 DIAGNOSIS — L309 Dermatitis, unspecified: Secondary | ICD-10-CM | POA: Diagnosis not present

## 2014-05-09 DIAGNOSIS — I890 Lymphedema, not elsewhere classified: Secondary | ICD-10-CM | POA: Diagnosis not present

## 2014-05-09 DIAGNOSIS — M179 Osteoarthritis of knee, unspecified: Secondary | ICD-10-CM | POA: Diagnosis not present

## 2014-05-17 ENCOUNTER — Other Ambulatory Visit: Payer: Self-pay | Admitting: Nurse Practitioner

## 2014-05-24 ENCOUNTER — Telehealth: Payer: Self-pay | Admitting: Oncology

## 2014-05-24 NOTE — Telephone Encounter (Signed)
per GM to move pt to HF GM on call-cld pt & left updated time & date

## 2014-05-28 DIAGNOSIS — F329 Major depressive disorder, single episode, unspecified: Secondary | ICD-10-CM | POA: Diagnosis not present

## 2014-05-28 DIAGNOSIS — L509 Urticaria, unspecified: Secondary | ICD-10-CM | POA: Diagnosis not present

## 2014-05-28 DIAGNOSIS — I10 Essential (primary) hypertension: Secondary | ICD-10-CM | POA: Diagnosis not present

## 2014-05-28 DIAGNOSIS — M179 Osteoarthritis of knee, unspecified: Secondary | ICD-10-CM | POA: Diagnosis not present

## 2014-05-28 DIAGNOSIS — G4700 Insomnia, unspecified: Secondary | ICD-10-CM | POA: Diagnosis not present

## 2014-05-28 DIAGNOSIS — E039 Hypothyroidism, unspecified: Secondary | ICD-10-CM | POA: Diagnosis not present

## 2014-05-28 DIAGNOSIS — K219 Gastro-esophageal reflux disease without esophagitis: Secondary | ICD-10-CM | POA: Diagnosis not present

## 2014-05-28 DIAGNOSIS — D509 Iron deficiency anemia, unspecified: Secondary | ICD-10-CM | POA: Diagnosis not present

## 2014-05-28 DIAGNOSIS — I509 Heart failure, unspecified: Secondary | ICD-10-CM | POA: Diagnosis not present

## 2014-05-28 DIAGNOSIS — Z6841 Body Mass Index (BMI) 40.0 and over, adult: Secondary | ICD-10-CM | POA: Diagnosis not present

## 2014-06-11 DIAGNOSIS — I890 Lymphedema, not elsewhere classified: Secondary | ICD-10-CM | POA: Diagnosis not present

## 2014-06-11 DIAGNOSIS — M159 Polyosteoarthritis, unspecified: Secondary | ICD-10-CM | POA: Diagnosis not present

## 2014-06-11 DIAGNOSIS — G4700 Insomnia, unspecified: Secondary | ICD-10-CM | POA: Diagnosis not present

## 2014-06-11 DIAGNOSIS — M545 Low back pain: Secondary | ICD-10-CM | POA: Diagnosis not present

## 2014-06-11 DIAGNOSIS — K219 Gastro-esophageal reflux disease without esophagitis: Secondary | ICD-10-CM | POA: Diagnosis not present

## 2014-06-11 DIAGNOSIS — I509 Heart failure, unspecified: Secondary | ICD-10-CM | POA: Diagnosis not present

## 2014-06-11 DIAGNOSIS — C50919 Malignant neoplasm of unspecified site of unspecified female breast: Secondary | ICD-10-CM | POA: Diagnosis not present

## 2014-06-11 DIAGNOSIS — E039 Hypothyroidism, unspecified: Secondary | ICD-10-CM | POA: Diagnosis not present

## 2014-06-11 DIAGNOSIS — I10 Essential (primary) hypertension: Secondary | ICD-10-CM | POA: Diagnosis not present

## 2014-06-11 DIAGNOSIS — F329 Major depressive disorder, single episode, unspecified: Secondary | ICD-10-CM | POA: Diagnosis not present

## 2014-06-11 DIAGNOSIS — D509 Iron deficiency anemia, unspecified: Secondary | ICD-10-CM | POA: Diagnosis not present

## 2014-06-24 ENCOUNTER — Other Ambulatory Visit (HOSPITAL_BASED_OUTPATIENT_CLINIC_OR_DEPARTMENT_OTHER): Payer: Medicare Other

## 2014-06-24 ENCOUNTER — Other Ambulatory Visit: Payer: Medicare Other

## 2014-06-24 ENCOUNTER — Encounter: Payer: Self-pay | Admitting: Nurse Practitioner

## 2014-06-24 ENCOUNTER — Other Ambulatory Visit: Payer: Self-pay | Admitting: *Deleted

## 2014-06-24 ENCOUNTER — Ambulatory Visit (HOSPITAL_BASED_OUTPATIENT_CLINIC_OR_DEPARTMENT_OTHER): Payer: Medicare Other | Admitting: Nurse Practitioner

## 2014-06-24 ENCOUNTER — Ambulatory Visit: Payer: Medicare Other | Admitting: Oncology

## 2014-06-24 ENCOUNTER — Telehealth: Payer: Self-pay | Admitting: Nurse Practitioner

## 2014-06-24 VITALS — BP 124/68 | HR 76 | Temp 98.5°F | Resp 18 | Wt 192.7 lb

## 2014-06-24 DIAGNOSIS — C773 Secondary and unspecified malignant neoplasm of axilla and upper limb lymph nodes: Secondary | ICD-10-CM | POA: Diagnosis not present

## 2014-06-24 DIAGNOSIS — E039 Hypothyroidism, unspecified: Secondary | ICD-10-CM

## 2014-06-24 DIAGNOSIS — F17200 Nicotine dependence, unspecified, uncomplicated: Secondary | ICD-10-CM

## 2014-06-24 DIAGNOSIS — C50912 Malignant neoplasm of unspecified site of left female breast: Secondary | ICD-10-CM

## 2014-06-24 DIAGNOSIS — C50412 Malignant neoplasm of upper-outer quadrant of left female breast: Secondary | ICD-10-CM

## 2014-06-24 DIAGNOSIS — K5900 Constipation, unspecified: Secondary | ICD-10-CM

## 2014-06-24 DIAGNOSIS — Z170 Estrogen receptor positive status [ER+]: Secondary | ICD-10-CM | POA: Diagnosis not present

## 2014-06-24 LAB — CBC WITH DIFFERENTIAL/PLATELET
BASO%: 0.3 % (ref 0.0–2.0)
BASOS ABS: 0 10*3/uL (ref 0.0–0.1)
EOS ABS: 0.1 10*3/uL (ref 0.0–0.5)
EOS%: 1.7 % (ref 0.0–7.0)
HCT: 38.3 % (ref 34.8–46.6)
HEMOGLOBIN: 12.1 g/dL (ref 11.6–15.9)
LYMPH#: 1.6 10*3/uL (ref 0.9–3.3)
LYMPH%: 24.7 % (ref 14.0–49.7)
MCH: 27.5 pg (ref 25.1–34.0)
MCHC: 31.7 g/dL (ref 31.5–36.0)
MCV: 87 fL (ref 79.5–101.0)
MONO#: 0.5 10*3/uL (ref 0.1–0.9)
MONO%: 8.2 % (ref 0.0–14.0)
NEUT#: 4.1 10*3/uL (ref 1.5–6.5)
NEUT%: 65.1 % (ref 38.4–76.8)
Platelets: 215 10*3/uL (ref 145–400)
RBC: 4.4 10*6/uL (ref 3.70–5.45)
RDW: 15.2 % — ABNORMAL HIGH (ref 11.2–14.5)
WBC: 6.3 10*3/uL (ref 3.9–10.3)

## 2014-06-24 LAB — COMPREHENSIVE METABOLIC PANEL (CC13)
ALBUMIN: 3.8 g/dL (ref 3.5–5.0)
ALK PHOS: 54 U/L (ref 40–150)
ALT: 25 U/L (ref 0–55)
AST: 17 U/L (ref 5–34)
Anion Gap: 9 mEq/L (ref 3–11)
BILIRUBIN TOTAL: 0.31 mg/dL (ref 0.20–1.20)
BUN: 19.4 mg/dL (ref 7.0–26.0)
CO2: 25 mEq/L (ref 22–29)
CREATININE: 1.5 mg/dL — AB (ref 0.6–1.1)
Calcium: 9.7 mg/dL (ref 8.4–10.4)
Chloride: 108 mEq/L (ref 98–109)
EGFR: 44 mL/min/{1.73_m2} — ABNORMAL LOW (ref >90)
GLUCOSE: 80 mg/dL (ref 70–140)
Potassium: 5.2 mEq/L — ABNORMAL HIGH (ref 3.5–5.1)
SODIUM: 142 meq/L (ref 136–145)
Total Protein: 7.1 g/dL (ref 6.4–8.3)

## 2014-06-24 MED ORDER — ANASTROZOLE 1 MG PO TABS: 1.0000 mg | ORAL_TABLET | Freq: Every day | ORAL | Status: DC

## 2014-06-24 NOTE — Progress Notes (Addendum)
ID: [name]   DOB: April 20, 1960  MR#: [id number]  WUJ#:[id number]  [name] GYN:  SU: Kimberly Keens, MD OTHER MD:  Kimberly Rudd, MD  CHIEF COMPLAINT:  Left Breast Cancer CURRENT TREATMENT: anastrozole 1mg  daily  BREAST CANCER HISTORY: [name] noted a change in her left breast in October 2012. [name] set her up for mammography at the [facility] 01/29/2011, and this showed an irregular high-density mass with ill-defined margins in the upper outer quadrant of the left breast. This was palpable and mobile. Ultrasound showed an irregular hypoechoic mass with lobulated margins measuring up to 2.9 cm. One left axillary lymph node was noted to be enlarged at 2.6 cm. There were no other abnormal lymph nodes noted.   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  With this information the patient was set up for bilateral breast MRIs 02/05/2011 showing in the left breast mass in question to measure up to 4.9 cm. There were multiple small nodules without fatty hila in the left axilla, suspicious for metastatic disease there. The right side was unremarkable and there was no suspicious internal mammary adenopathy.   On 02/08/2011 the patient underwent biopsy of the largest left axillary lymph node, and this was positive([id number]).  Patient was treated in the neoadjuvant setting and received 2 cycles of every 3 week docetaxel/carboplatin. This was discontinued after cycle 2 due to in tolerance with associated chest pain and significant PPE in both the upper and lower extremities. Her treatment course was switched to Q3 week IV CMF/ trastuzumab, but the trastuzumab was held secondary to worsening left ventricular ejection fraction a history of congestive heart  failure. (Echo on 06/15/2011 showed an EF between 35 and 40%.)   Essentially, the patient completed 4 q. three-week doses of  CMF, with last cycle given on 06/22/2011.   She is status post lumpectomy under the care of [name] on 07/16/2011 with what node removal, final pathology showing a residual T2 N0 tumor. Residual tumor was again ER and PR positive and HER-2/neu positive.  Patient received radiation therapy under the care of [name] from June of 2013 until September of 2013. She was then started on anastrozole at 1 mg daily in early November 2013.  Additional history is as detailed below.  INTERVAL HISTORY: [name] returns to clinic today for follow up of her breast cancer. She has been on anastrozole since November 2013 and is tolerating it well with few complaints. She has no hot flashes or vaginal changes. She has a history of lower back pain and uses percocet PRN for pain.   REVIEW OF SYSTEMS: [name] denies fevers, chills, nausea, or vomiting. She has had significant constipation for the past few days, likely the result of her narcotic use. She has to take miralax every other day and "a sip" of magnesium citrate to even have a bowel movement, and they are almost always hard. She is eating and drinking well. She sleeps poorly at night even with melatonin and ambien. She has shortness of breath with rest and activity, but has a history of congestive heart failure. She has picked back up smoking for the past 5 months when her PCP changed her from chantix to wellbutrin. She does not believe this is helping at all.  A detailed review of systems is otherwise stable.   PAST MEDICAL HISTORY: Past Medical History  Diagnosis Date  . Depression   . Hypothyroidism   . Congestive heart failure   . Emphysema of lung   . GERD (gastroesophageal reflux disease)   . Heart murmur   . Anemia 05/19/2011  . Nonischemic cardiomyopathy     LVEF 38%. MUGA, 2010, 50-55% by ECHO 04/14/11. Cardiologist Dr.  Fransico [name]  . Pneumonia   . History of bronchitis   . Shortness of breath 07/16/11    "at all times"  . Blood transfusion   . Chronic lower back pain   . Breast cancer 07/16/2011    left breast lumpectomy=invasive ductal ca,2. cm,high grade ductal ca in situ,11 benign lymph nodes  . Obesity   . Allergy   . Neuromuscular disorder     tingling left hand  . Myocardial infarction 2003  . Hyperlipidemia   . History of radiation therapy 09/06/11-11/24/11    left breast  60.4gray total dose  . Coronary artery disease     Negative cath 2001  . Anxiety   . Urticaria   . Other primary cardiomyopathies   . Chest pain, unspecified     Sharp midsternal chest pain at night with no radiation. Hard for her to take a deep breath in. NTG does help. Normal Myocardial perfusion study 05/14/11.  [name] Hypertension   . Chronic combined systolic and diastolic CHF (congestive heart failure)     PAST SURGICAL HISTORY: Past Surgical History  Procedure Laterality Date  . Portacath placement  02/26/2011    Procedure: INSERTION PORT-A-CATH;  Surgeon: Kimberly Bowie, MD;  Location: [place];  Service: General;  Laterality: Right;  . Breast biopsy      left breast  . Breast lumpectomy  07/16/11    w/LND; left.ER/PR=positive  . Abdominal hysterectomy  ~2006    partial  . Tubal ligation  1991  . Port-a-cath removal N/A 07/31/2012    Procedure: REMOVAL PORT-A-CATH;  Surgeon: Kimberly Bowie, MD;  Location: [facility];  Service: General;  Laterality: N/A;  . Cardiac catheterization  2001    negative for CAD    FAMILY HISTORY Family History  Problem Relation Age of Onset  . Leukemia Mother   . Heart disease Maternal Grandmother   The patient's father died at the age of 67 from renal failure. The patient's mother died at the age of 75 from acute leukemia. The patient has 2 brothers and 2 sisters, there is no history of breast or ovarian cancer in the family to her knowledge.   GYNECOLOGIC HISTORY: Menarche  age 53, menopause 2005, the patient never took hormone replacement. She is GX P3, first pregnancy to term age 68.  S/p TAH with right salpingo-oophorectomy remotely.  SOCIAL HISTORY:  (Updated December 2014) The patient is currently unemployed. She is divorced and lives by herself. Her children are [name], lives in [place] and works for the post office, she is 53 years old; [name], 50 a lives in [place]. [name] and works for IT [employer], and [name], 31, who lives in [place] and works in daycare. The patient has 5 grandchildren.  She attends the [employer] in [place].     ADVANCED DIRECTIVES:  (Updated 03/05/2013)   [name] tells me her friend, [name], who can be reached at [phone], would be her healthcare power of attorney. (I do not have a copy of that paperwork.)  HEALTH MAINTENANCE:  (  Updated December 2014) History  Substance Use Topics  . Smoking status: Current Every Day Smoker -- 1.00 packs/day for 40 years    Types: Cigarettes  . Smokeless tobacco: Never Used  . Alcohol Use: 0.0 oz/week     Comment: 07/16/11 "wine coolers; eve     Colonoscopy: never  PAP: not since hysterectomy  Bone density: Feb 2014, Normal  Lipid panel: UTD, [name]  Allergies  Allergen Reactions  . Aspirin Hives  . Docetaxel Other (See Comments)    Chest tightness and pain  . Morphine And Related Hives and Swelling  . Penicillins Hives  . Dairy [employer] [place]  . Other Hives  . Peanuts [Peanut Oil] Hives    Current Outpatient Prescriptions  Medication Sig Dispense Refill  . albuterol (PROVENTIL HFA;VENTOLIN HFA) 108 (90 BASE) MCG/ACT inhaler Inhale 2 puffs into the lungs every 6 (six) hours as needed for wheezing or shortness of breath.    . carvedilol (COREG) 25 MG tablet Take 25 mg by mouth 2 (two) times daily with a meal.     . Cetirizine HCl (ZYRTEC ALLERGY) 10 MG CAPS Take 1 capsule (10 mg total) by mouth at bedtime as needed (congestion). 30  capsule 0  . diphenhydramine-acetaminophen (TYLENOL PM) 25-500 MG TABS Take 3 tablets by mouth at bedtime as needed (sleep apnea).    . isosorbide-hydrALAZINE (BIDIL) 20-37.5 MG per tablet Take 1 tablet by mouth 3 (three) times daily.    [name] levothyroxine (SYNTHROID, LEVOTHROID) 88 MCG tablet Take 88 mcg by mouth daily before breakfast.    . lisinopril (PRINIVIL,ZESTRIL) 20 MG tablet Take 20 mg by mouth every morning.     . Melatonin 10 MG TABS Take 3 tablets by mouth at bedtime.    . Menthol, Topical Analgesic, (ZIMS MAX-FREEZE) 3.7 % GEL Apply 1 application topically daily as needed (for pain). Apply to back and legs    . nitroGLYCERIN (NITROSTAT) 0.4 MG SL tablet Place 0.4 mg under the tongue every 5 (five) minutes as needed. As needed for chest pain.    [name] omeprazole (PRILOSEC) 20 MG capsule Take 20 mg by mouth 2 (two) times daily.     [name] oxyCODONE-acetaminophen (PERCOCET/ROXICET) 5-325 MG per tablet     . polyethylene glycol (MIRALAX / GLYCOLAX) packet Take 17 g by mouth daily as needed for moderate constipation.    [name] zolpidem (AMBIEN) 10 MG tablet Take 10 mg by mouth at bedtime.     [name] anastrozole (ARIMIDEX) 1 MG tablet Take 1 tablet (1 mg total) by mouth daily. 90 tablet 2  . predniSONE (DELTASONE) 10 MG tablet Take 4 tablets (40 mg total) by mouth daily. (Patient not taking: Reported on 06/24/2014) 20 tablet 0  . spironolactone (ALDACTONE) 25 MG tablet      No current facility-administered medications for this visit.   Facility-Administered Medications Ordered in Other Visits  Medication Dose Route Frequency Provider Last Rate Last Dose  . sodium chloride 0.9 % injection 10 mL  10 mL Intracatheter PRN Chauncey Cruel, MD        OBJECTIVE: Middle-aged [place] American female who appears uncomfortable but is in no acute distress Filed Vitals:   06/24/14 1436  BP: 124/68  Pulse: 76  Temp: 98.5 F (36.9 C)  Resp: 18     Body mass index is 38.9 kg/(m^2).    ECOG FS: 1 Filed Weights    06/24/14 1436  Weight: 192 lb 11.2 oz (87.408 kg)     Physical Exam:  Skin: warm, dry  HEENT: sclerae anicteric, conjunctivae pink, oropharynx clear. No thrush or mucositis.  Lymph Nodes: No cervical or supraclavicular lymphadenopathy  Lungs: clear to auscultation bilaterally, no rales, wheezes, or rhonci  Heart: regular rate and rhythm  Abdomen: obese, soft, non tender, positive bowel sounds  Musculoskeletal: No focal spinal tenderness, grade 2 lymphedema to left arm and hand, no compression sleeve in place Neuro:non focal, well oriented, distant affect  Breasts: left breast status post lumpectomy and radiation. Palpable scar tissue to incision line. Left axilla benign. Right breast unremarkable.  LAB RESULTS: Lab Results  Component Value Date   WBC 6.3 06/24/2014   NEUTROABS 4.1 06/24/2014   HGB 12.1 06/24/2014   HCT 38.3 06/24/2014   MCV 87.0 06/24/2014   PLT 215 06/24/2014      Chemistry      Component Value Date/Time   NA 142 06/24/2014 1316   NA 139 12/30/2013 1631   K 5.2* 06/24/2014 1316   K 3.7 12/30/2013 1631   CL 98 12/30/2013 1631   CL 104 09/05/2012 1049   CO2 25 06/24/2014 1316   CO2 24 12/30/2013 1631   BUN 19.4 06/24/2014 1316   BUN 25* 12/30/2013 1631   CREATININE 1.5* 06/24/2014 1316   CREATININE 1.56* 12/30/2013 1631      Component Value Date/Time   CALCIUM 9.7 06/24/2014 1316   CALCIUM 9.8 12/30/2013 1631   ALKPHOS 54 06/24/2014 1316   ALKPHOS 76 10/20/2011 1324   AST 17 06/24/2014 1316   AST 22 10/20/2011 1324   ALT 25 06/24/2014 1316   ALT 20 10/20/2011 1324   BILITOT 0.31 06/24/2014 1316   BILITOT 0.3 10/20/2011 1324        STUDIES:  Most recent bilateral mammogram on 09/19/2013 was unremarkable.  Most recent bone density test on 05/03/2012 was normal.   ASSESSMENT: 54 y.o.  [facility] woman  (1) status post left breast biopsy on November 2012 for an invasive ductal carcinoma, grade 2, which was estrogen receptor positive at 99%,  progesterone receptor weakly positive at 2%, with an MIB-1 of 61%, and HER-2 amplification by CISH with a ratio of 2.87.   Biopsy of a left axillary node was also positive for cancer.  (2)  treated in the neoadjuvant setting and received 2 cycles of every 3 week docetaxel/carboplatin. This was discontinued after cycle 2 due to in tolerance with associated chest pain and significant PPE in both the upper and lower extremities.  (3)   Her treatment course was switched to Q3 week IV CMF/ trastuzumab, but the trastuzumab was held secondary to worsening left ventricular ejection fraction a history of congestive heart failure. (Echo on 06/15/2011 showed an EF between 35 and 40%.)   Essentially, the patient completed 4 q. three-week doses of  CMF, with last cycle given on 06/22/2011.   (4)  status post lumpectomy under the care of [name] on 07/16/2011 with what node removal, final pathology showing a residual T2 N0 tumor. Residual tumor was again ER and PR positive and HER-2/neu positive.  (5)  Patient received radiation therapy under the care of [name] from June of 2013 until September of 2013.   (6)  She was then started on anastrozole at 1 mg daily in early November 2013.  (7)  multiple comorbidities including hypothyroidism, history of congestive heart failure, COPD, GERD, gout, and depression.  (8)  severe swelling in the left upper extremity, worsening since November 2014   PLAN: As far as  her breast cancer is concerned, [name] is doing well today. The labs were reviewed in detail and were entirely stable. She is now 3 years out from her definitive surgery with no evidence of recurrent disease. She is tolerating the anastrozole well and will continue until November 2018 to complete 5 years of antiestrogen therapy. She was due for a repeat bone density scan in February, so I will place orders for this to be performed along with her next mammogram in July.   I have advised [name] to take her  miralax daily and add a stool softener such as colace to the mix. She is already drinking plenty of water and eating fibrous foods such as fruits and vegetable.  When she visits with her PCP she will ask to be taken off of the wellbutrin and switched back to chantix. She is unmotivated to quit smoking again until this occurs.   [name] will return in 1 year for labs and a follow up visit. She understands and agrees with this plan. She knows the goal of treatment in her case is cure. She has been encouraged to call with any issues that might arise before her next visit here.  Laurie Panda, NP     06/24/2014

## 2014-06-24 NOTE — Telephone Encounter (Signed)
per pof to sch pt appt-gave pt copy of sch °

## 2014-06-25 ENCOUNTER — Telehealth: Payer: Self-pay | Admitting: Nurse Practitioner

## 2014-06-25 DIAGNOSIS — I10 Essential (primary) hypertension: Secondary | ICD-10-CM | POA: Diagnosis not present

## 2014-06-25 DIAGNOSIS — I429 Cardiomyopathy, unspecified: Secondary | ICD-10-CM | POA: Diagnosis not present

## 2014-06-25 NOTE — Addendum Note (Signed)
Addended by: Marcelino Duster on: 06/25/2014 08:44 AM   Modules accepted: Orders

## 2014-06-25 NOTE — Telephone Encounter (Signed)
per pof to sch pt mamma & DEXA-cld & gave pt time/date/location-pt understood

## 2014-07-03 DIAGNOSIS — E780 Pure hypercholesterolemia: Secondary | ICD-10-CM | POA: Diagnosis not present

## 2014-07-03 DIAGNOSIS — I10 Essential (primary) hypertension: Secondary | ICD-10-CM | POA: Diagnosis not present

## 2014-07-03 DIAGNOSIS — I5032 Chronic diastolic (congestive) heart failure: Secondary | ICD-10-CM | POA: Diagnosis not present

## 2014-07-03 DIAGNOSIS — I129 Hypertensive chronic kidney disease with stage 1 through stage 4 chronic kidney disease, or unspecified chronic kidney disease: Secondary | ICD-10-CM | POA: Diagnosis not present

## 2014-07-10 DIAGNOSIS — I890 Lymphedema, not elsewhere classified: Secondary | ICD-10-CM | POA: Diagnosis not present

## 2014-07-10 DIAGNOSIS — F329 Major depressive disorder, single episode, unspecified: Secondary | ICD-10-CM | POA: Diagnosis not present

## 2014-07-10 DIAGNOSIS — D509 Iron deficiency anemia, unspecified: Secondary | ICD-10-CM | POA: Diagnosis not present

## 2014-07-10 DIAGNOSIS — G4700 Insomnia, unspecified: Secondary | ICD-10-CM | POA: Diagnosis not present

## 2014-07-10 DIAGNOSIS — J309 Allergic rhinitis, unspecified: Secondary | ICD-10-CM | POA: Diagnosis not present

## 2014-07-10 DIAGNOSIS — I509 Heart failure, unspecified: Secondary | ICD-10-CM | POA: Diagnosis not present

## 2014-07-10 DIAGNOSIS — I10 Essential (primary) hypertension: Secondary | ICD-10-CM | POA: Diagnosis not present

## 2014-07-10 DIAGNOSIS — M179 Osteoarthritis of knee, unspecified: Secondary | ICD-10-CM | POA: Diagnosis not present

## 2014-07-10 DIAGNOSIS — K219 Gastro-esophageal reflux disease without esophagitis: Secondary | ICD-10-CM | POA: Diagnosis not present

## 2014-07-10 DIAGNOSIS — Z6841 Body Mass Index (BMI) 40.0 and over, adult: Secondary | ICD-10-CM | POA: Diagnosis not present

## 2014-07-10 DIAGNOSIS — C50919 Malignant neoplasm of unspecified site of unspecified female breast: Secondary | ICD-10-CM | POA: Diagnosis not present

## 2014-07-10 DIAGNOSIS — E039 Hypothyroidism, unspecified: Secondary | ICD-10-CM | POA: Diagnosis not present

## 2014-08-06 DIAGNOSIS — G4700 Insomnia, unspecified: Secondary | ICD-10-CM | POA: Diagnosis not present

## 2014-08-06 DIAGNOSIS — M179 Osteoarthritis of knee, unspecified: Secondary | ICD-10-CM | POA: Diagnosis not present

## 2014-08-06 DIAGNOSIS — I10 Essential (primary) hypertension: Secondary | ICD-10-CM | POA: Diagnosis not present

## 2014-08-06 DIAGNOSIS — D509 Iron deficiency anemia, unspecified: Secondary | ICD-10-CM | POA: Diagnosis not present

## 2014-08-06 DIAGNOSIS — I509 Heart failure, unspecified: Secondary | ICD-10-CM | POA: Diagnosis not present

## 2014-08-06 DIAGNOSIS — I890 Lymphedema, not elsewhere classified: Secondary | ICD-10-CM | POA: Diagnosis not present

## 2014-08-06 DIAGNOSIS — J309 Allergic rhinitis, unspecified: Secondary | ICD-10-CM | POA: Diagnosis not present

## 2014-08-06 DIAGNOSIS — E039 Hypothyroidism, unspecified: Secondary | ICD-10-CM | POA: Diagnosis not present

## 2014-08-06 DIAGNOSIS — K219 Gastro-esophageal reflux disease without esophagitis: Secondary | ICD-10-CM | POA: Diagnosis not present

## 2014-08-06 DIAGNOSIS — C50919 Malignant neoplasm of unspecified site of unspecified female breast: Secondary | ICD-10-CM | POA: Diagnosis not present

## 2014-08-06 DIAGNOSIS — F329 Major depressive disorder, single episode, unspecified: Secondary | ICD-10-CM | POA: Diagnosis not present

## 2014-09-03 DIAGNOSIS — F329 Major depressive disorder, single episode, unspecified: Secondary | ICD-10-CM | POA: Diagnosis not present

## 2014-09-03 DIAGNOSIS — I10 Essential (primary) hypertension: Secondary | ICD-10-CM | POA: Diagnosis not present

## 2014-09-03 DIAGNOSIS — J309 Allergic rhinitis, unspecified: Secondary | ICD-10-CM | POA: Diagnosis not present

## 2014-09-03 DIAGNOSIS — M179 Osteoarthritis of knee, unspecified: Secondary | ICD-10-CM | POA: Diagnosis not present

## 2014-09-03 DIAGNOSIS — E039 Hypothyroidism, unspecified: Secondary | ICD-10-CM | POA: Diagnosis not present

## 2014-09-03 DIAGNOSIS — C50919 Malignant neoplasm of unspecified site of unspecified female breast: Secondary | ICD-10-CM | POA: Diagnosis not present

## 2014-09-03 DIAGNOSIS — I509 Heart failure, unspecified: Secondary | ICD-10-CM | POA: Diagnosis not present

## 2014-09-03 DIAGNOSIS — I890 Lymphedema, not elsewhere classified: Secondary | ICD-10-CM | POA: Diagnosis not present

## 2014-09-03 DIAGNOSIS — M545 Low back pain: Secondary | ICD-10-CM | POA: Diagnosis not present

## 2014-09-03 DIAGNOSIS — G4700 Insomnia, unspecified: Secondary | ICD-10-CM | POA: Diagnosis not present

## 2014-09-03 DIAGNOSIS — D509 Iron deficiency anemia, unspecified: Secondary | ICD-10-CM | POA: Diagnosis not present

## 2014-09-03 DIAGNOSIS — K219 Gastro-esophageal reflux disease without esophagitis: Secondary | ICD-10-CM | POA: Diagnosis not present

## 2014-09-17 DIAGNOSIS — I10 Essential (primary) hypertension: Secondary | ICD-10-CM | POA: Diagnosis not present

## 2014-09-17 DIAGNOSIS — I129 Hypertensive chronic kidney disease with stage 1 through stage 4 chronic kidney disease, or unspecified chronic kidney disease: Secondary | ICD-10-CM | POA: Diagnosis not present

## 2014-09-17 DIAGNOSIS — E780 Pure hypercholesterolemia: Secondary | ICD-10-CM | POA: Diagnosis not present

## 2014-09-17 DIAGNOSIS — I5032 Chronic diastolic (congestive) heart failure: Secondary | ICD-10-CM | POA: Diagnosis not present

## 2014-09-25 DIAGNOSIS — D509 Iron deficiency anemia, unspecified: Secondary | ICD-10-CM | POA: Diagnosis not present

## 2014-09-25 DIAGNOSIS — K219 Gastro-esophageal reflux disease without esophagitis: Secondary | ICD-10-CM | POA: Diagnosis not present

## 2014-09-25 DIAGNOSIS — L508 Other urticaria: Secondary | ICD-10-CM | POA: Diagnosis not present

## 2014-09-25 DIAGNOSIS — C50919 Malignant neoplasm of unspecified site of unspecified female breast: Secondary | ICD-10-CM | POA: Diagnosis not present

## 2014-09-25 DIAGNOSIS — J309 Allergic rhinitis, unspecified: Secondary | ICD-10-CM | POA: Diagnosis not present

## 2014-09-25 DIAGNOSIS — I10 Essential (primary) hypertension: Secondary | ICD-10-CM | POA: Diagnosis not present

## 2014-09-25 DIAGNOSIS — G4700 Insomnia, unspecified: Secondary | ICD-10-CM | POA: Diagnosis not present

## 2014-09-25 DIAGNOSIS — I890 Lymphedema, not elsewhere classified: Secondary | ICD-10-CM | POA: Diagnosis not present

## 2014-09-25 DIAGNOSIS — E039 Hypothyroidism, unspecified: Secondary | ICD-10-CM | POA: Diagnosis not present

## 2014-09-25 DIAGNOSIS — M179 Osteoarthritis of knee, unspecified: Secondary | ICD-10-CM | POA: Diagnosis not present

## 2014-09-25 DIAGNOSIS — I509 Heart failure, unspecified: Secondary | ICD-10-CM | POA: Diagnosis not present

## 2014-09-25 DIAGNOSIS — Z6841 Body Mass Index (BMI) 40.0 and over, adult: Secondary | ICD-10-CM | POA: Diagnosis not present

## 2014-09-30 DIAGNOSIS — L500 Allergic urticaria: Secondary | ICD-10-CM | POA: Diagnosis not present

## 2014-10-01 ENCOUNTER — Ambulatory Visit
Admission: RE | Admit: 2014-10-01 | Discharge: 2014-10-01 | Disposition: A | Discharge status: Home / Self Care | Payer: Medicare Other | Source: Ambulatory Visit | Attending: Nurse Practitioner | Admitting: Nurse Practitioner

## 2014-10-01 DIAGNOSIS — M810 Age-related osteoporosis without current pathological fracture: Secondary | ICD-10-CM

## 2014-10-01 DIAGNOSIS — Z13820 Encounter for screening for osteoporosis: Secondary | ICD-10-CM | POA: Diagnosis not present

## 2014-10-01 DIAGNOSIS — R928 Other abnormal and inconclusive findings on diagnostic imaging of breast: Secondary | ICD-10-CM | POA: Diagnosis not present

## 2014-10-01 DIAGNOSIS — C50912 Malignant neoplasm of unspecified site of left female breast: Secondary | ICD-10-CM

## 2014-10-01 DIAGNOSIS — Z780 Asymptomatic menopausal state: Secondary | ICD-10-CM | POA: Diagnosis not present

## 2014-10-09 ENCOUNTER — Other Ambulatory Visit: Payer: Self-pay | Admitting: Nurse Practitioner

## 2014-11-06 DIAGNOSIS — I10 Essential (primary) hypertension: Secondary | ICD-10-CM | POA: Diagnosis not present

## 2014-11-06 DIAGNOSIS — I480 Paroxysmal atrial fibrillation: Secondary | ICD-10-CM | POA: Diagnosis not present

## 2014-11-06 DIAGNOSIS — K219 Gastro-esophageal reflux disease without esophagitis: Secondary | ICD-10-CM | POA: Diagnosis not present

## 2014-11-06 DIAGNOSIS — D509 Iron deficiency anemia, unspecified: Secondary | ICD-10-CM | POA: Diagnosis not present

## 2014-11-06 DIAGNOSIS — E039 Hypothyroidism, unspecified: Secondary | ICD-10-CM | POA: Diagnosis not present

## 2014-11-06 DIAGNOSIS — F329 Major depressive disorder, single episode, unspecified: Secondary | ICD-10-CM | POA: Diagnosis not present

## 2014-11-06 DIAGNOSIS — M549 Dorsalgia, unspecified: Secondary | ICD-10-CM | POA: Diagnosis not present

## 2014-11-06 DIAGNOSIS — I509 Heart failure, unspecified: Secondary | ICD-10-CM | POA: Diagnosis not present

## 2014-11-06 DIAGNOSIS — Z6841 Body Mass Index (BMI) 40.0 and over, adult: Secondary | ICD-10-CM | POA: Diagnosis not present

## 2014-11-06 DIAGNOSIS — J309 Allergic rhinitis, unspecified: Secondary | ICD-10-CM | POA: Diagnosis not present

## 2014-11-06 DIAGNOSIS — M179 Osteoarthritis of knee, unspecified: Secondary | ICD-10-CM | POA: Diagnosis not present

## 2014-11-06 DIAGNOSIS — C50919 Malignant neoplasm of unspecified site of unspecified female breast: Secondary | ICD-10-CM | POA: Diagnosis not present

## 2014-11-18 DIAGNOSIS — J45901 Unspecified asthma with (acute) exacerbation: Secondary | ICD-10-CM | POA: Diagnosis not present

## 2014-11-18 DIAGNOSIS — L500 Allergic urticaria: Secondary | ICD-10-CM | POA: Diagnosis not present

## 2014-12-03 DIAGNOSIS — K219 Gastro-esophageal reflux disease without esophagitis: Secondary | ICD-10-CM | POA: Diagnosis not present

## 2014-12-03 DIAGNOSIS — I890 Lymphedema, not elsewhere classified: Secondary | ICD-10-CM | POA: Diagnosis not present

## 2014-12-03 DIAGNOSIS — M179 Osteoarthritis of knee, unspecified: Secondary | ICD-10-CM | POA: Diagnosis not present

## 2014-12-03 DIAGNOSIS — D509 Iron deficiency anemia, unspecified: Secondary | ICD-10-CM | POA: Diagnosis not present

## 2014-12-03 DIAGNOSIS — G4700 Insomnia, unspecified: Secondary | ICD-10-CM | POA: Diagnosis not present

## 2014-12-03 DIAGNOSIS — M545 Low back pain: Secondary | ICD-10-CM | POA: Diagnosis not present

## 2014-12-03 DIAGNOSIS — C50919 Malignant neoplasm of unspecified site of unspecified female breast: Secondary | ICD-10-CM | POA: Diagnosis not present

## 2014-12-03 DIAGNOSIS — J309 Allergic rhinitis, unspecified: Secondary | ICD-10-CM | POA: Diagnosis not present

## 2014-12-03 DIAGNOSIS — E039 Hypothyroidism, unspecified: Secondary | ICD-10-CM | POA: Diagnosis not present

## 2014-12-03 DIAGNOSIS — I10 Essential (primary) hypertension: Secondary | ICD-10-CM | POA: Diagnosis not present

## 2014-12-03 DIAGNOSIS — I509 Heart failure, unspecified: Secondary | ICD-10-CM | POA: Diagnosis not present

## 2014-12-03 DIAGNOSIS — Z6841 Body Mass Index (BMI) 40.0 and over, adult: Secondary | ICD-10-CM | POA: Diagnosis not present

## 2014-12-04 DIAGNOSIS — L509 Urticaria, unspecified: Secondary | ICD-10-CM

## 2014-12-04 DIAGNOSIS — J45909 Unspecified asthma, uncomplicated: Secondary | ICD-10-CM | POA: Insufficient documentation

## 2015-01-13 ENCOUNTER — Ambulatory Visit: Payer: Self-pay | Admitting: Allergy and Immunology

## 2015-01-26 IMAGING — CR DG CHEST 2V
2 series · 2 of 2 positions shown · non-contrast
Comparison: 12/30/2013

CLINICAL DATA: Cough, shortness of Breath, generalized body ache

EXAM:
CHEST  2 VIEW

[w chest pa]
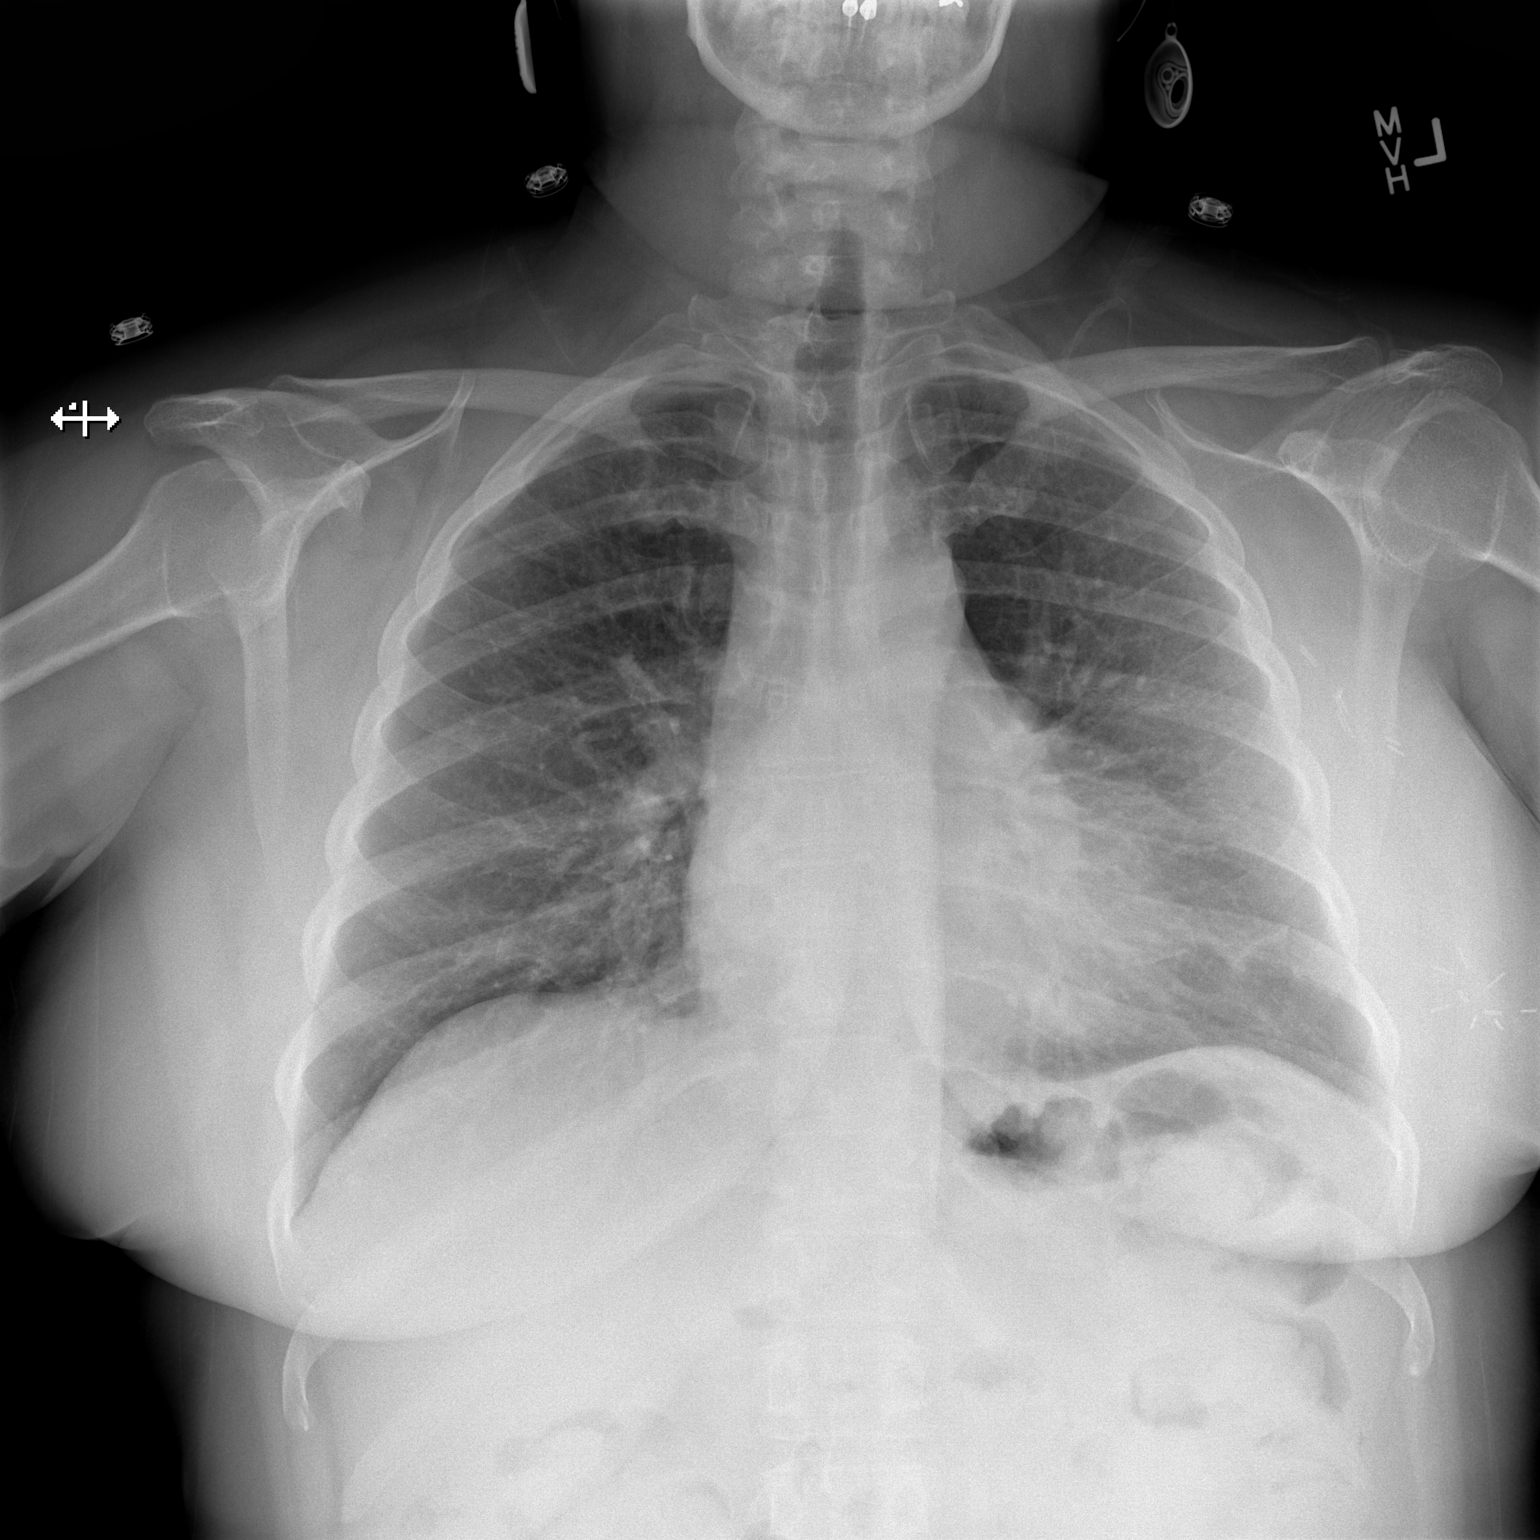

[w chest lat]
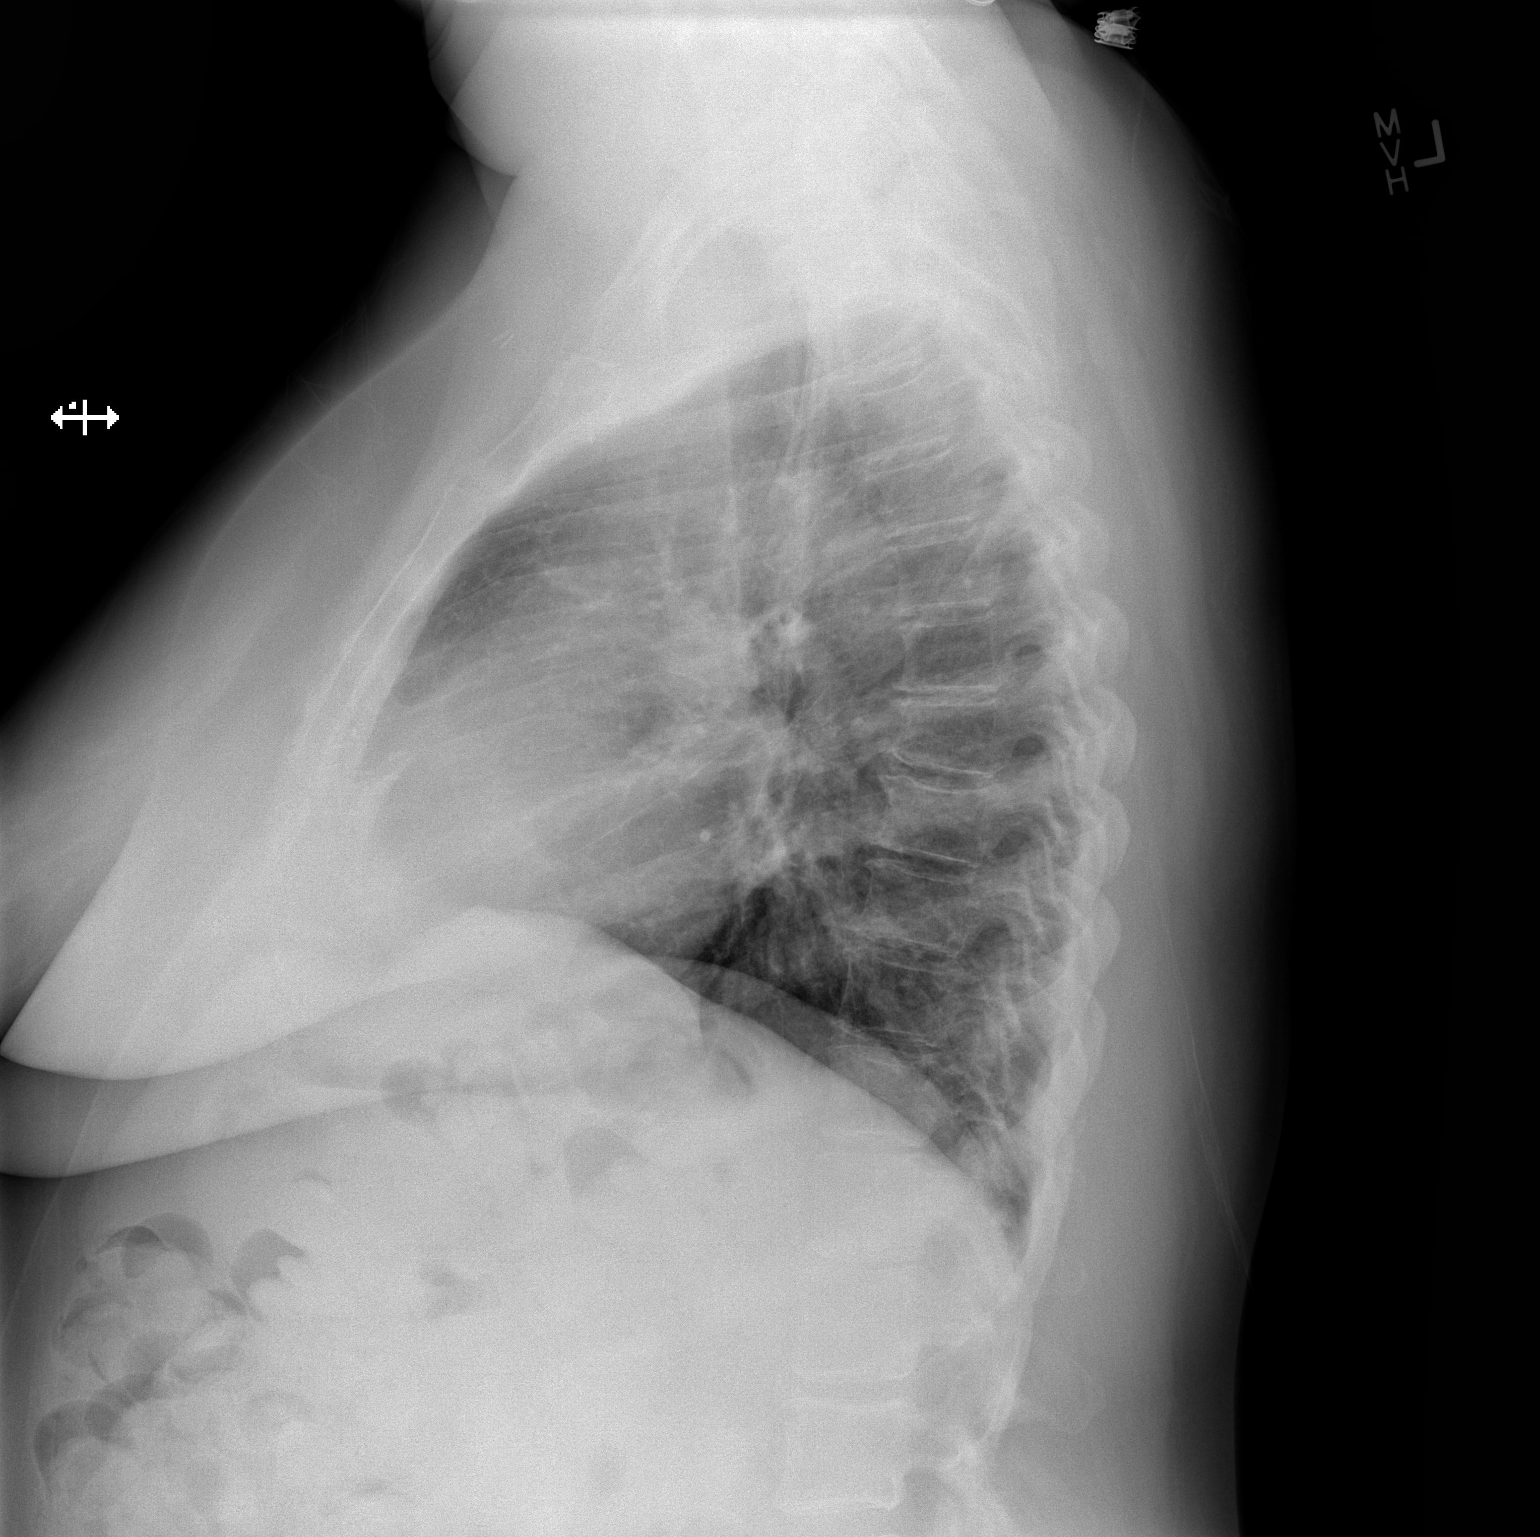

[2 of 2 positions shown; findings below may reference images not displayed]

FINDINGS: Cardiomediastinal silhouette is stable. Surgical clips in left
axilla again noted. No acute infiltrate or pleural effusion. No
pulmonary edema. Mild degenerative changes thoracic spine.
IMPRESSION: No active cardiopulmonary disease.

## 2015-02-26 DIAGNOSIS — I10 Essential (primary) hypertension: Secondary | ICD-10-CM | POA: Diagnosis not present

## 2015-03-26 DIAGNOSIS — C50919 Malignant neoplasm of unspecified site of unspecified female breast: Secondary | ICD-10-CM | POA: Diagnosis not present

## 2015-03-26 DIAGNOSIS — I890 Lymphedema, not elsewhere classified: Secondary | ICD-10-CM | POA: Diagnosis not present

## 2015-03-26 DIAGNOSIS — I10 Essential (primary) hypertension: Secondary | ICD-10-CM | POA: Diagnosis not present

## 2015-03-26 DIAGNOSIS — I509 Heart failure, unspecified: Secondary | ICD-10-CM | POA: Diagnosis not present

## 2015-03-26 DIAGNOSIS — J309 Allergic rhinitis, unspecified: Secondary | ICD-10-CM | POA: Diagnosis not present

## 2015-03-26 DIAGNOSIS — M545 Low back pain: Secondary | ICD-10-CM | POA: Diagnosis not present

## 2015-03-26 DIAGNOSIS — F329 Major depressive disorder, single episode, unspecified: Secondary | ICD-10-CM | POA: Diagnosis not present

## 2015-03-26 DIAGNOSIS — E039 Hypothyroidism, unspecified: Secondary | ICD-10-CM | POA: Diagnosis not present

## 2015-03-26 DIAGNOSIS — K219 Gastro-esophageal reflux disease without esophagitis: Secondary | ICD-10-CM | POA: Diagnosis not present

## 2015-03-26 DIAGNOSIS — D464 Refractory anemia, unspecified: Secondary | ICD-10-CM | POA: Diagnosis not present

## 2015-03-26 DIAGNOSIS — M179 Osteoarthritis of knee, unspecified: Secondary | ICD-10-CM | POA: Diagnosis not present

## 2015-04-03 ENCOUNTER — Ambulatory Visit: Payer: Medicare Other | Admitting: Allergy and Immunology

## 2015-04-03 DIAGNOSIS — K5900 Constipation, unspecified: Secondary | ICD-10-CM | POA: Diagnosis not present

## 2015-04-03 DIAGNOSIS — Z1211 Encounter for screening for malignant neoplasm of colon: Secondary | ICD-10-CM | POA: Diagnosis not present

## 2015-04-03 DIAGNOSIS — I509 Heart failure, unspecified: Secondary | ICD-10-CM | POA: Diagnosis not present

## 2015-04-04 ENCOUNTER — Other Ambulatory Visit: Payer: Self-pay | Admitting: Allergy and Immunology

## 2015-06-01 ENCOUNTER — Other Ambulatory Visit: Payer: Self-pay | Admitting: Allergy and Immunology

## 2015-06-08 DIAGNOSIS — S93402A Sprain of unspecified ligament of left ankle, initial encounter: Secondary | ICD-10-CM | POA: Diagnosis not present

## 2015-06-08 DIAGNOSIS — J449 Chronic obstructive pulmonary disease, unspecified: Secondary | ICD-10-CM | POA: Diagnosis not present

## 2015-06-08 DIAGNOSIS — I10 Essential (primary) hypertension: Secondary | ICD-10-CM | POA: Diagnosis not present

## 2015-06-08 DIAGNOSIS — M25572 Pain in left ankle and joints of left foot: Secondary | ICD-10-CM | POA: Diagnosis not present

## 2015-06-08 DIAGNOSIS — E079 Disorder of thyroid, unspecified: Secondary | ICD-10-CM | POA: Diagnosis not present

## 2015-06-08 DIAGNOSIS — M25472 Effusion, left ankle: Secondary | ICD-10-CM | POA: Diagnosis not present

## 2015-06-08 DIAGNOSIS — Z859 Personal history of malignant neoplasm, unspecified: Secondary | ICD-10-CM | POA: Diagnosis not present

## 2015-06-08 DIAGNOSIS — F17200 Nicotine dependence, unspecified, uncomplicated: Secondary | ICD-10-CM | POA: Diagnosis not present

## 2015-06-08 DIAGNOSIS — M7989 Other specified soft tissue disorders: Secondary | ICD-10-CM | POA: Diagnosis not present

## 2015-06-08 DIAGNOSIS — M79672 Pain in left foot: Secondary | ICD-10-CM | POA: Diagnosis not present

## 2015-06-08 DIAGNOSIS — S93602A Unspecified sprain of left foot, initial encounter: Secondary | ICD-10-CM | POA: Diagnosis not present

## 2015-06-24 ENCOUNTER — Other Ambulatory Visit (HOSPITAL_BASED_OUTPATIENT_CLINIC_OR_DEPARTMENT_OTHER): Payer: Medicare Other

## 2015-06-24 ENCOUNTER — Ambulatory Visit (HOSPITAL_BASED_OUTPATIENT_CLINIC_OR_DEPARTMENT_OTHER): Payer: Medicare Other | Admitting: Oncology

## 2015-06-24 ENCOUNTER — Telehealth: Payer: Self-pay | Admitting: Oncology

## 2015-06-24 VITALS — BP 138/87 | HR 86 | Temp 98.3°F | Resp 18 | Ht 59.0 in | Wt 196.8 lb

## 2015-06-24 DIAGNOSIS — C50812 Malignant neoplasm of overlapping sites of left female breast: Secondary | ICD-10-CM

## 2015-06-24 DIAGNOSIS — Z853 Personal history of malignant neoplasm of breast: Secondary | ICD-10-CM

## 2015-06-24 DIAGNOSIS — C50912 Malignant neoplasm of unspecified site of left female breast: Secondary | ICD-10-CM

## 2015-06-24 DIAGNOSIS — C50412 Malignant neoplasm of upper-outer quadrant of left female breast: Secondary | ICD-10-CM | POA: Diagnosis not present

## 2015-06-24 DIAGNOSIS — L299 Pruritus, unspecified: Secondary | ICD-10-CM

## 2015-06-24 LAB — CBC WITH DIFFERENTIAL/PLATELET
BASO%: 0.2 % (ref 0.0–2.0)
BASOS ABS: 0 10*3/uL (ref 0.0–0.1)
EOS%: 1.4 % (ref 0.0–7.0)
Eosinophils Absolute: 0.1 10*3/uL (ref 0.0–0.5)
HEMATOCRIT: 37.9 % (ref 34.8–46.6)
HEMOGLOBIN: 12.1 g/dL (ref 11.6–15.9)
LYMPH#: 0.8 10*3/uL — AB (ref 0.9–3.3)
LYMPH%: 11.6 % — ABNORMAL LOW (ref 14.0–49.7)
MCH: 27.1 pg (ref 25.1–34.0)
MCHC: 32.1 g/dL (ref 31.5–36.0)
MCV: 84.6 fL (ref 79.5–101.0)
MONO#: 0.4 10*3/uL (ref 0.1–0.9)
MONO%: 5.5 % (ref 0.0–14.0)
NEUT#: 5.8 10*3/uL (ref 1.5–6.5)
NEUT%: 81.3 % — ABNORMAL HIGH (ref 38.4–76.8)
Platelets: 261 10*3/uL (ref 145–400)
RBC: 4.48 10*6/uL (ref 3.70–5.45)
RDW: 14.7 % — AB (ref 11.2–14.5)
WBC: 7.2 10*3/uL (ref 3.9–10.3)

## 2015-06-24 LAB — COMPREHENSIVE METABOLIC PANEL
ALBUMIN: 3.7 g/dL (ref 3.5–5.0)
ALK PHOS: 76 U/L (ref 40–150)
ALT: 13 U/L (ref 0–55)
AST: 15 U/L (ref 5–34)
Anion Gap: 9 mEq/L (ref 3–11)
BILIRUBIN TOTAL: 0.33 mg/dL (ref 0.20–1.20)
BUN: 10.6 mg/dL (ref 7.0–26.0)
CALCIUM: 10.1 mg/dL (ref 8.4–10.4)
CO2: 32 mEq/L — ABNORMAL HIGH (ref 22–29)
CREATININE: 1.2 mg/dL — AB (ref 0.6–1.1)
Chloride: 104 mEq/L (ref 98–109)
EGFR: 58 mL/min/{1.73_m2} — ABNORMAL LOW (ref >90)
Glucose: 94 mg/dl (ref 70–140)
Potassium: 3.4 mEq/L — ABNORMAL LOW (ref 3.5–5.1)
Sodium: 144 mEq/L (ref 136–145)
TOTAL PROTEIN: 7.7 g/dL (ref 6.4–8.3)

## 2015-06-24 MED ORDER — ANASTROZOLE 1 MG PO TABS: 1.0000 mg | ORAL_TABLET | Freq: Every day | ORAL | Status: DC

## 2015-06-24 NOTE — Telephone Encounter (Signed)
appt made and avs printed °

## 2015-06-24 NOTE — Progress Notes (Signed)
ID: [name]   DOB: [birth year]  MR#: [id number]  FMB#:[id number]  PCP: [name] GYN:  SU: Kimberly Keens, MD OTHER MD:  Kimberly Rudd, MD, Kimberly Nakai MD  CHIEF COMPLAINT:  Left Breast Cancer  CURRENT TREATMENT: anastrozole 43m daily  BREAST CANCER HISTORY: From the earlier summary note:  LCatalinanoted a change in her left breast in October 2012. Dr. CTobie Lordsset [facility], and this showed an irregular high-density mass with ill-defined margins in the upper outer quadrant of the left breast. This was palpable and mobile. Ultrasound showed an irregular hypoechoic mass with lobulated margins measuring up to 2.9 cm. One left axillary lymph node was noted to be enlarged at 2.6 cm. There were no other abnormal lymph nodes noted.   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  With this information the patient was set up for bilateral breast MRIs 02/05/2011 showing in the left breast mass in question to measure up to 4.9 cm. There were multiple small nodules without fatty hila in the left axilla, suspicious for metastatic disease there. The right side was unremarkable and there was no suspicious internal mammary adenopathy.   On 02/08/2011 the patient underwent biopsy of the largest left axillary lymph node, and this was positive([id number]).  Patient was treated in the neoadjuvant setting and received 2 cycles of every 3 week docetaxel/carboplatin. This was discontinued after cycle 2 due to in tolerance with associated chest pain and significant PPE in both the upper and lower extremities. Her treatment course was switched to Q3 week IV CMF/ trastuzumab, but the trastuzumab was held secondary to worsening left  ventricular ejection fraction a history of congestive heart failure. (Echo on 06/15/2011 showed an EF between 35 and 40%.)   Essentially, the patient completed 4 q. three-week doses of  CMF, with last cycle given on 06/22/2011.   She is status post lumpectomy under the care of Dr. BNinfa [name] 07/16/2011 with what node removal, final pathology showing a residual T2 N0 tumor. Residual tumor was again ER and PR positive and HER-2/neu positive.  Patient received radiation therapy under the care of Dr. MLisbeth Renshawfrom June of 2013 until September of 2013. She was then started on anastrozole at 1 mg daily in early November 2013.  Additional history is as detailed below.  INTERVAL HISTORY: LTziporahreturns to clinic today for follow up of her estrogen receptor positive breast cancer. She continues on anastrozole, with good tolerance. She obtains it at a very good price.  REVIEW OF SYSTEMS: LKarnis plagued by chronic hives. She tells me she has seen her primary doctor several times and the allergist as well. They have tried various antihistamines and she is currently on steroids. Nothing seems to be helping. In addition she has a bit of a cough and recently was started on a Z-Pak. She tells me she fell this past Sunday while going to church and had a swollen ankle for while. That has subsided. Aside from these issues a detailed review of systems today was stable  PAST MEDICAL HISTORY: Past Medical History  Diagnosis Date  . Depression   . Hypothyroidism   . Congestive heart failure   . Emphysema of lung   . GERD (gastroesophageal reflux  disease)   . Heart murmur   . Anemia 05/19/2011  . Nonischemic cardiomyopathy (HCC)     LVEF 38%. MUGA, 2010, 50-55% by ECHO 04/14/11. Cardiologist Dr. Fransico [name]  . Pneumonia   . History of bronchitis   . Shortness of breath 07/16/11    "at all times"  . Blood transfusion   . Chronic lower back pain   . Breast cancer 07/16/2011    left breast lumpectomy=invasive  ductal ca,2. cm,high grade ductal ca in situ,11 benign lymph nodes  . Obesity   . Allergy   . Neuromuscular disorder     tingling left hand  . Myocardial infarction ([place]) 2003  . Hyperlipidemia   . History of radiation therapy 09/06/11-11/24/11    left breast  60.4gray total dose  . Coronary artery disease     Negative cath 2001  . Anxiety   . Urticaria   . Other primary cardiomyopathies   . Chest pain, unspecified     Sharp midsternal chest pain at night with no radiation. Hard for her to take a deep breath in. NTG does help. Normal Myocardial perfusion study 05/14/11.  [name] Hypertension   . Chronic combined systolic and diastolic CHF (congestive heart failure)     PAST SURGICAL HISTORY: Past Surgical History  Procedure Laterality Date  . Portacath placement  02/26/2011    Procedure: INSERTION PORT-A-CATH;  Surgeon: Kimberly Bowie, MD;  Location: [facility];  Service: General;  Laterality: Right;  . Breast biopsy      left breast  . Breast lumpectomy  07/16/11    w/LND; left.ER/PR=positive  . Abdominal hysterectomy  ~2006    partial  . Tubal ligation  1991  . Port-a-cath removal N/A 07/31/2012    Procedure: REMOVAL PORT-A-CATH;  Surgeon: Kimberly Bowie, MD;  Location[facility];  Service: General;  Laterality: N/A;  . Cardiac catheterization  2001    negative for CAD    FAMILY HISTORY Family History  Problem Relation Age of Onset  . Leukemia Mother   . Heart disease Maternal Grandmother   The patient's father died at the age of 64 from renal failure. The patient's mother died at the age of 60 from acute leukemia. The patient has 2 brothers and 2 sisters, there is no history of breast or ovarian cancer in the family to her knowledge.   GYNECOLOGIC HISTORY: Menarche age 89, menopause 2005, the patient never took hormone replacement. She is GX P3, first pregnancy to term age 38.  S/p TAH with right salpingo-oophorectomy remotely.  SOCIAL HISTORY:  (Updated December 2014) The  patient is currently unemployed. She is divorced and lives by herself. Her children are [name], lives in [place] and works for the post office, she is 54 years old; [name], 64 a lives in [place]. [place] and works for [employer], and [employer], 52, who lives in [place] and works in daycare. The patient has 5 grandchildren.  She attends the [employer] in [place].     ADVANCED DIRECTIVES:  (Updated 03/05/2013)   Suellyn tells me her friend, Jeanann Lewandowsky, who can be reached at [phone], would be her healthcare power of attorney. (I do not have a copy of that paperwork.)  HEALTH MAINTENANCE:  (Updated December 2014) Social History  Substance Use Topics  . Smoking status: Current Every Day Smoker -- 1.00 packs/day for 40 years    Types: Cigarettes  . Smokeless tobacco: Never Used  . Alcohol Use: 0.0 oz/week  Comment: 07/16/11 "wine coolers; eve     Colonoscopy: never  PAP: not since hysterectomy  Bone density: Feb 2014, Normal  Lipid panel: UTD, Dr. Tobie Lords  Allergies  Allergen Reactions  . Aspirin Hives  . Docetaxel Other (See Comments)    Chest tightness and pain  . Morphine And Related Hives and Swelling  . Penicillins Hives  . Dairy Aid [place]  . Other Hives  . Peanuts [Peanut Oil] Hives    Current Outpatient Prescriptions  Medication Sig Dispense Refill  . albuterol (PROVENTIL HFA;VENTOLIN HFA) 108 (90 BASE) MCG/ACT inhaler Inhale 2 puffs into the lungs every 6 (six) hours as needed for wheezing or shortness of breath.    . anastrozole (ARIMIDEX) 1 MG tablet Take 1 tablet (1 mg total) by mouth daily. 90 tablet 2  . budesonide (RHINOCORT ALLERGY) 32 MCG/ACT nasal spray Place 1 spray into both nostrils daily.    . budesonide-formoterol (SYMBICORT) 160-4.5 MCG/ACT inhaler Inhale 2 puffs into the lungs 2 (two) times daily.    . carvedilol (COREG) 25 MG tablet Take 25 mg by mouth 2 (two) times daily with a meal.     . Cetirizine HCl  (ZYRTEC ALLERGY) 10 MG CAPS Take 1 capsule (10 mg total) by mouth at bedtime as needed (congestion). 30 capsule 0  . diphenhydramine-acetaminophen (TYLENOL PM) 25-500 MG TABS Take 3 tablets by mouth at bedtime as needed (sleep apnea).    . fexofenadine (ALLEGRA) 180 MG tablet Take 180 mg by mouth daily.    . isosorbide-hydrALAZINE (BIDIL) 20-37.5 MG per tablet Take 1 tablet by mouth 3 (three) times daily.    [name] levothyroxine (SYNTHROID, LEVOTHROID) 88 MCG tablet Take 88 mcg by mouth daily before breakfast.    . Melatonin 10 MG TABS Take 3 tablets by mouth at bedtime.    . Menthol, Topical Analgesic, (ZIMS MAX-FREEZE) 3.7 % GEL Apply 1 application topically daily as needed (for pain). Apply to back and legs    . montelukast (SINGULAIR) 10 MG tablet TAKE ONE TABLET BY MOUTH EVERY DAY AS DIRECTED 30 tablet 5  . nitroGLYCERIN (NITROSTAT) 0.4 MG SL tablet Place 0.4 mg under the tongue every 5 (five) minutes as needed. As needed for chest pain.    [name] omeprazole (PRILOSEC) 20 MG capsule Take 20 mg by mouth 2 (two) times daily.     [name] oxyCODONE-acetaminophen (PERCOCET/ROXICET) 5-325 MG per tablet     . polyethylene glycol (MIRALAX / GLYCOLAX) packet Take 17 g by mouth daily as needed for moderate constipation.    . predniSONE (DELTASONE) 10 MG tablet Take 4 tablets (40 mg total) by mouth daily. (Patient not taking: Reported on 06/24/2014) 20 tablet 0  . ranitidine (ZANTAC) 300 MG tablet TAKE ONE TABLET BY MOUTH ONCE DAILY AS DIRECTED 30 tablet 5  . spironolactone (ALDACTONE) 25 MG tablet     . zolpidem (AMBIEN) 10 MG tablet Take 10 mg by mouth at bedtime.      No current facility-administered medications for this visit.   Facility-Administered Medications Ordered in Other Visits  Medication Dose Route Frequency Provider Last Rate Last Dose  . sodium chloride 0.9 % injection 10 mL  10 mL Intracatheter PRN Chauncey Cruel, MD        OBJECTIVE: Middle-aged [place] American female who appears stated  age 55 Vitals:   06/24/15 1338  BP: 138/87  Pulse: 86  Temp: 98.3 F (36.8 C)  Resp: 18     Body mass index is 39.73 kg/(m^2).  ECOG FS: 1 Filed Weights   06/24/15 1338  Weight: 196 lb 12.8 oz (89.268 kg)     Sclerae unicteric, pupils round and equal Oropharynx clear and moist-- no thrush or other lesions No cervical or supraclavicular adenopathy Lungs no rales or rhonchi Heart regular rate and rhythm Abd soft, obese, nontender, positive bowel sounds MSK no focal spinal tenderness, chronic left upper extremity lymphedema, grade 1 Neuro: nonfocal, well oriented, exacerbated affect Breasts: The right breast is unremarkable. The left breast is status post lumpectomy and radiation. There is no evidence of local recurrence. The left axilla is benign. Skin: The patient has multiple scattered mostly nonpalpable erythematous lesions which are very pruritic; there is no scaling or vesiculation  LAB RESULTS: Lab Results  Component Value Date   WBC 7.2 06/24/2015   NEUTROABS 5.8 06/24/2015   HGB 12.1 06/24/2015   HCT 37.9 06/24/2015   MCV 84.6 06/24/2015   PLT 261 06/24/2015      Chemistry      Component Value Date/Time   NA 142 06/24/2014 1316   NA 139 12/30/2013 1631   K 5.2* 06/24/2014 1316   K 3.7 12/30/2013 1631   CL 98 12/30/2013 1631   CL 104 09/05/2012 1049   CO2 25 06/24/2014 1316   CO2 24 12/30/2013 1631   BUN 19.4 06/24/2014 1316   BUN 25* 12/30/2013 1631   CREATININE 1.5* 06/24/2014 1316   CREATININE 1.56* 12/30/2013 1631      Component Value Date/Time   CALCIUM 9.7 06/24/2014 1316   CALCIUM 9.8 12/30/2013 1631   ALKPHOS 54 06/24/2014 1316   ALKPHOS 76 10/20/2011 1324   AST 17 06/24/2014 1316   AST 22 10/20/2011 1324   ALT 25 06/24/2014 1316   ALT 20 10/20/2011 1324   BILITOT 0.31 06/24/2014 1316   BILITOT 0.3 10/20/2011 1324        STUDIES:  Mammography July 2016 was unremarkable.  Most recent bone density test on 05/03/2012 was  normal.   ASSESSMENT: 55 y.o.  [facility] woman  (1) status post left breast biopsy on November 2012 for an invasive ductal carcinoma, grade 2, which was estrogen receptor positive at 99%, progesterone receptor weakly positive at 2%, with an MIB-1 of 61%, and HER-2 amplification by CISH with a ratio of 2.87.   Biopsy of a left axillary node was also positive for cancer.  (2)  treated in the neoadjuvant setting and received 2 cycles of every 3 week docetaxel/carboplatin. This was discontinued after cycle 2 due to in tolerance with associated chest pain and significant PPE in both the upper and lower extremities.  (3)   Her treatment course was switched to Q3 week IV CMF/ trastuzumab, but the trastuzumab was held secondary to worsening left ventricular ejection fraction a history of congestive heart failure. (Echo on 06/15/2011 showed an EF between 35 and 40%.)   Essentially, the patient completed 4 q. three-week doses of  CMF, with last cycle given on 06/22/2011.   (4)  status post lumpectomy under the care of Dr. Ninfa Linden on 07/16/2011, final pathology showing a residual T2 N0 tumor. Residual tumor was again ER and PR positive and HER-2/neu positive.  (5)  Patient received radiation therapy under the care of Dr. Lisbeth [name] from June of 2013 until September of 2013.   (6)  She was then started on anastrozole at 1 mg daily in early November 2013.  (7)  multiple comorbidities including hypothyroidism, history of congestive heart failure, COPD, GERD, gout, and  depression.  (8) chronic left upper extremity lymphedema  (9) persistent pruritic hives   PLAN: Ralph is now 4 years out from her definitive surgery for her breast cancer. There is no evidence of disease recurrence. This is very favorable.  What is really affecting her quality of life in a major way is the pruritic skin lesions. I suspect these are due to medication and the most likely culprit is her Percocet. I suggested she discuss this  with her primary care physician and consider switching to a different narcotic, if a nonnarcotic pain medicine is not sufficient. She could try to milligrams of Dilantin, for example and see if that works for her pain and does not cause the rash.  Otherwise I would consider referral to a tertiary care center for further evaluation.  She is also upset that she still has a bad cough despite the Z-Pak she was recently prescribed. I suggest that it may take a little time for the antibiotics to work but certainly if things don't progress she will call her primary care physician.  I'm going to see her again in November. That will be 4 years into her anastrozole treatment. She will then have one more visit a year later after which she will be "out of the cancer business" assuming no recurrence.    Chauncey Cruel, MD     06/24/2015

## 2015-07-15 DIAGNOSIS — E7800 Pure hypercholesterolemia, unspecified: Secondary | ICD-10-CM | POA: Diagnosis not present

## 2015-07-15 DIAGNOSIS — R0681 Apnea, not elsewhere classified: Secondary | ICD-10-CM | POA: Diagnosis not present

## 2015-07-15 DIAGNOSIS — I5032 Chronic diastolic (congestive) heart failure: Secondary | ICD-10-CM | POA: Diagnosis not present

## 2015-07-15 DIAGNOSIS — K219 Gastro-esophageal reflux disease without esophagitis: Secondary | ICD-10-CM | POA: Diagnosis not present

## 2015-07-15 DIAGNOSIS — I10 Essential (primary) hypertension: Secondary | ICD-10-CM | POA: Diagnosis not present

## 2015-07-16 DIAGNOSIS — J208 Acute bronchitis due to other specified organisms: Secondary | ICD-10-CM | POA: Diagnosis not present

## 2015-07-16 DIAGNOSIS — Z79899 Other long term (current) drug therapy: Secondary | ICD-10-CM | POA: Diagnosis not present

## 2015-07-16 DIAGNOSIS — M1991 Primary osteoarthritis, unspecified site: Secondary | ICD-10-CM | POA: Diagnosis not present

## 2015-07-16 DIAGNOSIS — I890 Lymphedema, not elsewhere classified: Secondary | ICD-10-CM | POA: Diagnosis not present

## 2015-07-16 DIAGNOSIS — K219 Gastro-esophageal reflux disease without esophagitis: Secondary | ICD-10-CM | POA: Diagnosis not present

## 2015-07-16 DIAGNOSIS — C50919 Malignant neoplasm of unspecified site of unspecified female breast: Secondary | ICD-10-CM | POA: Diagnosis not present

## 2015-07-16 DIAGNOSIS — F17200 Nicotine dependence, unspecified, uncomplicated: Secondary | ICD-10-CM | POA: Diagnosis not present

## 2015-07-16 DIAGNOSIS — I10 Essential (primary) hypertension: Secondary | ICD-10-CM | POA: Diagnosis not present

## 2015-07-16 DIAGNOSIS — J309 Allergic rhinitis, unspecified: Secondary | ICD-10-CM | POA: Diagnosis not present

## 2015-10-08 DIAGNOSIS — M25562 Pain in left knee: Secondary | ICD-10-CM | POA: Diagnosis not present

## 2015-10-08 DIAGNOSIS — K219 Gastro-esophageal reflux disease without esophagitis: Secondary | ICD-10-CM | POA: Diagnosis not present

## 2015-10-08 DIAGNOSIS — C50919 Malignant neoplasm of unspecified site of unspecified female breast: Secondary | ICD-10-CM | POA: Diagnosis not present

## 2015-10-08 DIAGNOSIS — M1991 Primary osteoarthritis, unspecified site: Secondary | ICD-10-CM | POA: Diagnosis not present

## 2015-10-08 DIAGNOSIS — I890 Lymphedema, not elsewhere classified: Secondary | ICD-10-CM | POA: Diagnosis not present

## 2015-10-08 DIAGNOSIS — I10 Essential (primary) hypertension: Secondary | ICD-10-CM | POA: Diagnosis not present

## 2015-10-08 DIAGNOSIS — F329 Major depressive disorder, single episode, unspecified: Secondary | ICD-10-CM | POA: Diagnosis not present

## 2015-10-08 DIAGNOSIS — J309 Allergic rhinitis, unspecified: Secondary | ICD-10-CM | POA: Diagnosis not present

## 2015-10-08 DIAGNOSIS — I509 Heart failure, unspecified: Secondary | ICD-10-CM | POA: Diagnosis not present

## 2015-10-08 DIAGNOSIS — E039 Hypothyroidism, unspecified: Secondary | ICD-10-CM | POA: Diagnosis not present

## 2015-10-08 DIAGNOSIS — F17200 Nicotine dependence, unspecified, uncomplicated: Secondary | ICD-10-CM | POA: Diagnosis not present

## 2015-11-07 DIAGNOSIS — M25562 Pain in left knee: Secondary | ICD-10-CM | POA: Diagnosis not present

## 2015-11-27 ENCOUNTER — Other Ambulatory Visit: Payer: Self-pay | Admitting: Allergy and Immunology

## 2015-11-29 ENCOUNTER — Other Ambulatory Visit: Payer: Self-pay | Admitting: Allergy and Immunology

## 2015-12-01 ENCOUNTER — Telehealth: Payer: Self-pay | Admitting: Allergy and Immunology

## 2015-12-01 MED ORDER — MONTELUKAST SODIUM 10 MG PO TABS: 10.0000 mg | ORAL_TABLET | Freq: Every day | ORAL | 0 refills | Status: DC

## 2015-12-01 NOTE — Telephone Encounter (Signed)
She had called asking for refills on her Montelukast 10 mg. She was told she needed to have an appointment first. She took the first available appointment which was September 27. She has been out of the medication for 3 days now and wants to know if she can have 1 refill given to get her through until her appointment in 2 weeks.

## 2015-12-01 NOTE — Telephone Encounter (Signed)
Sent rx in pt notified

## 2015-12-03 DIAGNOSIS — I10 Essential (primary) hypertension: Secondary | ICD-10-CM | POA: Diagnosis not present

## 2015-12-04 ENCOUNTER — Other Ambulatory Visit: Payer: Self-pay | Admitting: Allergy and Immunology

## 2015-12-09 ENCOUNTER — Other Ambulatory Visit: Payer: Self-pay | Admitting: *Deleted

## 2015-12-09 ENCOUNTER — Telehealth: Payer: Self-pay | Admitting: Allergy and Immunology

## 2015-12-09 MED ORDER — RANITIDINE HCL 300 MG PO TABS: ORAL_TABLET | ORAL | 0 refills | Status: DC

## 2015-12-09 NOTE — Telephone Encounter (Signed)
Needs a refill on Zantac.  [facility] in [place]

## 2015-12-09 NOTE — Telephone Encounter (Signed)
Rx sent to pharmacy   

## 2015-12-17 ENCOUNTER — Ambulatory Visit: Payer: Medicare Other | Admitting: Allergy and Immunology

## 2015-12-24 ENCOUNTER — Ambulatory Visit (INDEPENDENT_AMBULATORY_CARE_PROVIDER_SITE_OTHER): Payer: Medicare Other | Admitting: Allergy and Immunology

## 2015-12-24 ENCOUNTER — Encounter: Payer: Self-pay | Admitting: Allergy and Immunology

## 2015-12-24 VITALS — BP 138/88 | HR 84 | Resp 20

## 2015-12-24 DIAGNOSIS — L509 Urticaria, unspecified: Secondary | ICD-10-CM

## 2015-12-24 DIAGNOSIS — J4540 Moderate persistent asthma, uncomplicated: Secondary | ICD-10-CM | POA: Diagnosis not present

## 2015-12-24 MED ORDER — BUDESONIDE-FORMOTEROL FUMARATE 160-4.5 MCG/ACT IN AERO: 2.0000 | INHALATION_SPRAY | Freq: Two times a day (BID) | RESPIRATORY_TRACT | 5 refills | Status: DC

## 2015-12-24 MED ORDER — MONTELUKAST SODIUM 10 MG PO TABS: 10.0000 mg | ORAL_TABLET | Freq: Every day | ORAL | 5 refills | Status: DC

## 2015-12-24 NOTE — Patient Instructions (Signed)
  1. Continue Symbicort 160 - 2 inhalations one-2 times per day depending on disease activity  2. Continue montelukast 10 mg daily  3. Continue fexofenadine 180 mg daily  4. Continue Ventolin HFA 2 puffs every 4-6 hours if needed  5. Use nasal saline to left nostril several times a day until healed  6. Return to clinic in 1 year or earlier if problem

## 2015-12-24 NOTE — Progress Notes (Signed)
Follow-up Note  Referring Provider: Cyndi Bender, PA-C Primary Provider: Cyndi Bender, PA-C Date of Office Visit: 12/24/2015  Subjective:   [name] (DOB: 08-06-1960) is a 55 y.o. female who returns to [facility] on 12/24/2015 in re-evaluation of the following:  HPI: [name] returns to this clinic in reevaluation of her asthma and chronic urticaria. I last saw her in his clinic in August 2016.  During the interval her asthma has been under excellent control. She uses her Symbicort about every day or every other day but only one time per day and while doing so she's had no flareups of her asthma and rarely uses a short acting bronchodilator and can exercise without any difficulty and has not required a systemic steroid to treat an exacerbation.  Her urticaria is doing wonderful as long she continues to use a combination of Allegra and montelukast.  She does complain about having an irritation inside her nose that she has to pick right commonly. Apparently this involves left nasal airway. She's never noticed any blood or pain. She does not have any sneezing or nasal congestion nor anosmia or decreased ability to taste or ugly nasal discharge. She does not use her nasal steroid at this point.  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   anastrozole 1 MG tablet Commonly known as:  ARIMIDEX Take 1 tablet (1 mg total) by mouth daily.   carvedilol 25 MG tablet Commonly known as:  COREG Take 25 mg by mouth 2 (two) times daily with a meal.   Cetirizine HCl 10 MG Caps Commonly known as:  ZYRTEC ALLERGY Take 1 capsule (10 mg total) by mouth at bedtime as needed (congestion).   diphenhydramine-acetaminophen 25-500 MG  Tabs tablet Commonly known as:  TYLENOL PM Take 3 tablets by mouth at bedtime as needed (sleep apnea).   fexofenadine 180 MG tablet Commonly known as:  ALLEGRA Take 180 mg by mouth daily.   levothyroxine 88 MCG tablet Commonly known as:  SYNTHROID, LEVOTHROID Take 88 mcg by mouth daily before breakfast.   Melatonin 10 MG Tabs Take 3 tablets by mouth at bedtime.   montelukast 10 MG tablet Commonly known as:  SINGULAIR Take 1 tablet (10 mg total) by mouth daily. as directed   nitroGLYCERIN 0.4 MG SL tablet Commonly known as:  NITROSTAT Place 0.4 mg under the tongue every 5 (five) minutes as needed. As needed for chest pain.   omeprazole 20 MG capsule Commonly known as:  PRILOSEC Take 20 mg by mouth 2 (two) times daily.   oxyCODONE-acetaminophen 5-325 MG tablet Commonly known as:  PERCOCET/ROXICET   polyethylene glycol packet Commonly known as:  MIRALAX / GLYCOLAX Take 17 g by mouth daily as needed for moderate constipation.   RHINOCORT ALLERGY 32 MCG/ACT nasal spray Generic drug:  budesonide Place 1 spray into both nostrils daily.   sertraline 100 MG tablet Commonly known as:  ZOLOFT   SYMBICORT 160-4.5 MCG/ACT inhaler Generic drug:  budesonide-formoterol Inhale 2 puffs into the lungs 2 (two) times daily.   zolpidem 10 MG tablet Commonly known as:  AMBIEN Take 10 mg by mouth at bedtime.       Past Medical History:  Diagnosis Date  . Allergy   . Anemia 05/19/2011  . Anxiety   . Blood transfusion   . Breast cancer ([place]) 07/16/2011   left breast lumpectomy=invasive ductal ca,2. cm,high grade ductal ca in situ,11 benign lymph nodes  . Chest pain, unspecified    Sharp midsternal chest pain at night with no radiation. Hard for her to take a deep breath in. NTG does help. Normal Myocardial perfusion study 05/14/11.  [name] Chronic combined systolic and diastolic CHF (congestive heart failure) ([place])   . Chronic lower back pain   . Congestive heart failure ([place])   .  Coronary artery disease    Negative cath 2001  . Depression   . Emphysema of lung ([place])   . GERD (gastroesophageal reflux disease)   . Heart murmur   . History of bronchitis   . History of radiation therapy 09/06/11-11/24/11   left breast  60.4gray total dose  . Hyperlipidemia   . Hypertension   . Hypothyroidism   . Myocardial infarction 2003  . Neuromuscular disorder (HCC)    tingling left hand  . Nonischemic cardiomyopathy (HCC)    LVEF 38%. MUGA, 2010, 50-55% by ECHO 04/14/11. Cardiologist Dr. [name]  . Obesity   . Other primary cardiomyopathies   . Pneumonia   . Shortness of breath 07/16/11   "at all times"  . Urticaria     Past Surgical History:  Procedure Laterality Date  . ABDOMINAL HYSTERECTOMY  ~2006   partial  . BREAST BIOPSY     left breast  . BREAST LUMPECTOMY  07/16/11   w/LND; left.ER/PR=positive  . CARDIAC CATHETERIZATION  2001   negative for CAD  . PORT-A-CATH REMOVAL N/A 07/31/2012   Procedure: REMOVAL PORT-A-CATH;  Surgeon: Harl Bowie, MD;  Location[facility];  Service: General;  Laterality: N/A;  . PORTACATH PLACEMENT  02/26/2011   Procedure: INSERTION PORT-A-CATH;  Surgeon: Harl Bowie, MD;  Location: [facility];  Service: General;  Laterality: Right;  . TUBAL LIGATION  1991    Allergies  Allergen Reactions  . Aspirin Hives  . Docetaxel Other (See Comments)    Chest tightness and pain  . Morphine And Related Hives and Swelling  . Penicillins Hives  . Dairy Aid [place]  . Other Hives  . Peanuts [Peanut Oil] Hives    Review of systems negative except as noted in HPI / PMHx or noted below:  Review of Systems  Constitutional: Negative.   HENT: Negative.   Eyes: Negative.   Respiratory: Negative.   Cardiovascular: Negative.   Gastrointestinal: Negative.   Genitourinary: Negative.   Musculoskeletal: Negative.   Skin: Negative.   Neurological: Negative.   Endo/Heme/Allergies: Negative.   Psychiatric/Behavioral: Negative.        Objective:   Vitals:   12/24/15 1353  BP: 138/88  Pulse: 84  Resp: 20          Physical Exam  Constitutional: She is well-developed, well-nourished, and in no distress.  HENT:  Head: Normocephalic.  Right Ear: Tympanic membrane, external ear and ear canal normal.  Left Ear: Tympanic membrane, external ear and ear canal normal.  Nose: Nose normal. No mucosal edema or rhinorrhea.  Mouth/Throat: Uvula is midline, oropharynx is clear and moist and mucous membranes are normal. No oropharyngeal exudate.  Eyes: Conjunctivae are normal.  Neck: Trachea normal. No tracheal tenderness present. No tracheal deviation present. No thyromegaly present.  Cardiovascular: Normal rate, regular rhythm, S1 normal, S2 normal and normal heart sounds.  No murmur heard. Pulmonary/Chest: Breath sounds normal. No stridor. No respiratory distress. She has no wheezes. She has no rales.  Musculoskeletal: She exhibits no edema.  Lymphadenopathy:       Head (right side): No tonsillar adenopathy present.       Head (left side): No tonsillar adenopathy present.    She has no cervical adenopathy.  Neurological: She is alert. Gait normal.  Skin: No rash noted. She is not diaphoretic. No erythema. Nails show no clubbing.  Psychiatric: Mood and affect normal.    Diagnostics:    Spirometry was performed and demonstrated an FEV1 of 1.94 at 107 % of predicted.  Assessment and Plan:   1. Asthma, moderate persistent, well-controlled   2. Urticaria     1. Continue Symbicort 160 - 2 inhalations one-2 times per day depending on disease activity  2. Continue montelukast 10 mg daily  3. Continue fexofenadine 180 mg daily  4. Continue Ventolin HFA 2 puffs every 4-6 hours if needed  5. Use nasal saline to left nostril several times a day until healed  6. Return to clinic in 1 year or earlier if problem  Salley appears to be doing quite well on her current plan. Although her Symbicort uses is somewhat  unique this does appear to be working for her quite well and we'll continue to have her use Symbicort and montelukast. I do not see any obvious abnormality of her nasal airway on exam today and we've given her some nasal  Saline to use to hopefully heal up any irritation that is occurring in the airway. If this is not responding well then we may need to refer her to an ENT doctor for further evaluation. I will see her back in this clinic in 1 year or earlier if there is a problem  Allena Katz, MD [place]

## 2016-01-05 ENCOUNTER — Other Ambulatory Visit: Payer: Self-pay | Admitting: Allergy and Immunology

## 2016-02-02 ENCOUNTER — Other Ambulatory Visit: Payer: Self-pay | Admitting: *Deleted

## 2016-02-02 DIAGNOSIS — C50812 Malignant neoplasm of overlapping sites of left female breast: Secondary | ICD-10-CM

## 2016-02-03 ENCOUNTER — Ambulatory Visit: Payer: Medicare Other | Admitting: Oncology

## 2016-02-03 ENCOUNTER — Other Ambulatory Visit: Payer: Medicare Other

## 2016-02-04 ENCOUNTER — Encounter: Payer: Self-pay | Admitting: Oncology

## 2016-02-05 DIAGNOSIS — K649 Unspecified hemorrhoids: Secondary | ICD-10-CM | POA: Diagnosis not present

## 2016-02-18 DIAGNOSIS — Z853 Personal history of malignant neoplasm of breast: Secondary | ICD-10-CM | POA: Diagnosis not present

## 2016-03-16 ENCOUNTER — Other Ambulatory Visit: Payer: Self-pay | Admitting: Nurse Practitioner

## 2016-04-16 ENCOUNTER — Other Ambulatory Visit: Payer: Self-pay | Admitting: *Deleted

## 2016-04-16 ENCOUNTER — Telehealth: Payer: Self-pay | Admitting: *Deleted

## 2016-04-16 MED ORDER — ANASTROZOLE 1 MG PO TABS: 1.0000 mg | ORAL_TABLET | Freq: Every day | ORAL | 0 refills | Status: DC

## 2016-04-16 NOTE — Telephone Encounter (Signed)
[place] states pt has changed pharmacy from [facility] to [facility] in [place].  (Added pharmacy to chart).  Pharmacy says pt is requesting refill on Anastrozole sent to [facility].  please.

## 2016-04-20 ENCOUNTER — Telehealth: Payer: Self-pay | Admitting: Oncology

## 2016-04-20 NOTE — Telephone Encounter (Signed)
lvm to inform pt of r/s appt to 2/13 at 2 pm per LOS

## 2016-04-27 DIAGNOSIS — K219 Gastro-esophageal reflux disease without esophagitis: Secondary | ICD-10-CM | POA: Diagnosis not present

## 2016-04-27 DIAGNOSIS — J208 Acute bronchitis due to other specified organisms: Secondary | ICD-10-CM | POA: Diagnosis not present

## 2016-04-27 DIAGNOSIS — Z1389 Encounter for screening for other disorder: Secondary | ICD-10-CM | POA: Diagnosis not present

## 2016-04-27 DIAGNOSIS — C50919 Malignant neoplasm of unspecified site of unspecified female breast: Secondary | ICD-10-CM | POA: Diagnosis not present

## 2016-04-27 DIAGNOSIS — I890 Lymphedema, not elsewhere classified: Secondary | ICD-10-CM | POA: Diagnosis not present

## 2016-04-27 DIAGNOSIS — J309 Allergic rhinitis, unspecified: Secondary | ICD-10-CM | POA: Diagnosis not present

## 2016-04-27 DIAGNOSIS — F17200 Nicotine dependence, unspecified, uncomplicated: Secondary | ICD-10-CM | POA: Diagnosis not present

## 2016-05-04 ENCOUNTER — Other Ambulatory Visit (HOSPITAL_BASED_OUTPATIENT_CLINIC_OR_DEPARTMENT_OTHER): Payer: Medicare Other

## 2016-05-04 ENCOUNTER — Ambulatory Visit (HOSPITAL_BASED_OUTPATIENT_CLINIC_OR_DEPARTMENT_OTHER): Payer: Medicare Other | Admitting: Adult Health

## 2016-05-04 ENCOUNTER — Encounter: Payer: Self-pay | Admitting: Adult Health

## 2016-05-04 VITALS — BP 145/80 | HR 70 | Temp 97.8°F | Resp 18 | Ht 59.0 in | Wt 190.9 lb

## 2016-05-04 DIAGNOSIS — Z170 Estrogen receptor positive status [ER+]: Secondary | ICD-10-CM | POA: Diagnosis not present

## 2016-05-04 DIAGNOSIS — C50412 Malignant neoplasm of upper-outer quadrant of left female breast: Secondary | ICD-10-CM

## 2016-05-04 DIAGNOSIS — C50812 Malignant neoplasm of overlapping sites of left female breast: Secondary | ICD-10-CM

## 2016-05-04 DIAGNOSIS — E039 Hypothyroidism, unspecified: Secondary | ICD-10-CM

## 2016-05-04 DIAGNOSIS — Z853 Personal history of malignant neoplasm of breast: Secondary | ICD-10-CM | POA: Diagnosis not present

## 2016-05-04 LAB — CBC WITH DIFFERENTIAL/PLATELET
BASO%: 0.2 % (ref 0.0–2.0)
BASOS ABS: 0 10*3/uL (ref 0.0–0.1)
EOS ABS: 0.2 10*3/uL (ref 0.0–0.5)
EOS%: 3.2 % (ref 0.0–7.0)
HCT: 40.1 % (ref 34.8–46.6)
HGB: 13.3 g/dL (ref 11.6–15.9)
LYMPH#: 2 10*3/uL (ref 0.9–3.3)
LYMPH%: 32.9 % (ref 14.0–49.7)
MCH: 28.5 pg (ref 25.1–34.0)
MCHC: 33.2 g/dL (ref 31.5–36.0)
MCV: 85.8 fL (ref 79.5–101.0)
MONO#: 0.5 10*3/uL (ref 0.1–0.9)
MONO%: 8.4 % (ref 0.0–14.0)
NEUT#: 3.4 10*3/uL (ref 1.5–6.5)
NEUT%: 55.3 % (ref 38.4–76.8)
Platelets: 235 10*3/uL (ref 145–400)
RBC: 4.67 10*6/uL (ref 3.70–5.45)
RDW: 13.3 % (ref 11.2–14.5)
WBC: 6.2 10*3/uL (ref 3.9–10.3)

## 2016-05-04 LAB — COMPREHENSIVE METABOLIC PANEL
ALBUMIN: 4.2 g/dL (ref 3.5–5.0)
ALK PHOS: 79 U/L (ref 40–150)
ALT: 20 U/L (ref 0–55)
ANION GAP: 11 meq/L (ref 3–11)
AST: 20 U/L (ref 5–34)
BUN: 11.3 mg/dL (ref 7.0–26.0)
CO2: 29 mEq/L (ref 22–29)
Calcium: 10.1 mg/dL (ref 8.4–10.4)
Chloride: 103 mEq/L (ref 98–109)
Creatinine: 1.3 mg/dL — ABNORMAL HIGH (ref 0.6–1.1)
EGFR: 55 mL/min/{1.73_m2} — AB (ref >90)
Glucose: 88 mg/dl (ref 70–140)
Potassium: 2.9 mEq/L — CL (ref 3.5–5.1)
Sodium: 144 mEq/L (ref 136–145)
Total Bilirubin: 0.46 mg/dL (ref 0.20–1.20)
Total Protein: 8.2 g/dL (ref 6.4–8.3)

## 2016-05-04 NOTE — Progress Notes (Signed)
ID: [name]   DOB: [birth year]  MR#: [id number]  EPP#:[id number]  PCP: Cyndi Bender, PA-C GYN:  SU: [name], MD OTHER MD:  Kyung Rudd, MD, Cher Nakai MD  CHIEF COMPLAINT:  Left Breast Cancer  CURRENT TREATMENT: anastrozole '1mg'$  daily  BREAST CANCER HISTORY: From the earlier summary note:  [name] noted a change in her left breast in October 2012. Dr. Tobie Lords set her up for mammography at the [facility] 01/29/2011, and this showed an irregular high-density mass with ill-defined margins in the upper outer quadrant of the left breast. This was palpable and mobile. Ultrasound showed an irregular hypoechoic mass with lobulated margins measuring up to 2.9 cm. One left axillary lymph node was noted to be enlarged at 2.6 cm. There were no other abnormal lymph nodes noted.   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  With this information the patient was set up for bilateral breast MRIs 02/05/2011 showing in the left breast mass in question to measure up to 4.9 cm. There were multiple small nodules without fatty hila in the left axilla, suspicious for metastatic disease there. The right side was unremarkable and there was no suspicious internal mammary adenopathy.   On 02/08/2011 the patient underwent biopsy of the largest left axillary lymph node, and this was positive([id number]).  Patient was treated in the neoadjuvant setting and received 2 cycles of every 3 week docetaxel/carboplatin. This was discontinued after cycle 2 due to in tolerance with associated chest pain and significant PPE in both the upper and lower extremities. Her treatment course was switched to Q3 week IV CMF/ trastuzumab, but the trastuzumab was held secondary to worsening left  ventricular ejection fraction a history of congestive heart failure. (Echo on 06/15/2011 showed an EF between 35 and 40%.)   Essentially, the patient completed 4 q. three-week doses of  CMF, with last cycle given on 06/22/2011.   She is status post lumpectomy under the care of Dr. Ninfa Linden on 07/16/2011 with what node removal, final pathology showing a residual T2 N0 tumor. Residual tumor was again ER and PR positive and HER-2/neu positive.  Patient received radiation therapy under the care of Dr. Lisbeth Renshaw from June of 2013 until September of 2013. She was then started on anastrozole at 1 mg daily in early November 2013.  Additional history is as detailed below.  INTERVAL HISTORY: [name] is here today for follow up.  She is doing well today.  She is taking arimidex daily and is tolerating it well.  She does have joint aches and pains due to the arimidex.  She is due to stop in November, 2018 and she is looking forward to this date.  She undergoes mammo at the breast center, last DEXA in 09/2014 and it was normal.  She has not had a mammo since that time.  She declines making a mammogram appointment today due to finances.  She does have left arm lymphedema that is chronic.  She does not wear a sleeve or wrap it any longer due to the fact that she doesn't feel that it works.     REVIEW OF SYSTEMS: A complete 10 point ROS was conducted and is negative except what is noted above.   PAST MEDICAL HISTORY: Past Medical History:  Diagnosis Date  .  Allergy   . Anemia 05/19/2011  . Anxiety   . Blood transfusion   . Breast cancer ([place]) 07/16/2011   left breast lumpectomy=invasive ductal ca,2. cm,high grade ductal ca in situ,11 benign lymph nodes  . Chest pain, unspecified    Sharp midsternal chest pain at night with no radiation. Hard for her to take a deep breath in. NTG does help. Normal Myocardial perfusion study 05/14/11.  [name] Chronic combined systolic and diastolic CHF (congestive heart failure) ([place])   .  Chronic lower back pain   . Congestive heart failure ([place])   . Coronary artery disease    Negative cath 2001  . Depression   . Emphysema of lung ([place])   . GERD (gastroesophageal reflux disease)   . Heart murmur   . History of bronchitis   . History of radiation therapy 09/06/11-11/24/11   left breast  60.4gray total dose  . Hyperlipidemia   . Hypertension   . Hypothyroidism   . Myocardial infarction 2003  . Neuromuscular disorder (HCC)    tingling left hand  . Nonischemic cardiomyopathy (HCC)    LVEF 38%. MUGA, 2010, 50-55% by ECHO 04/14/11. Cardiologist Dr. [name]  . Obesity   . Other primary cardiomyopathies   . Pneumonia   . Shortness of breath 07/16/11   "at all times"  . Urticaria     PAST SURGICAL HISTORY: Past Surgical History:  Procedure Laterality Date  . ABDOMINAL HYSTERECTOMY  ~2006   partial  . BREAST BIOPSY     left breast  . BREAST LUMPECTOMY  07/16/11   w/LND; left.ER/PR=positive  . CARDIAC CATHETERIZATION  2001   negative for CAD  . PORT-A-CATH REMOVAL N/A 07/31/2012   Procedure: REMOVAL PORT-A-CATH;  Surgeon: Harl Bowie, MD;  Location: [facility];  Service: General;  Laterality: N/A;  . PORTACATH PLACEMENT  02/26/2011   Procedure: INSERTION PORT-A-CATH;  Surgeon: Harl Bowie, MD;  Location: [facility];  Service: General;  Laterality: Right;  . TUBAL LIGATION  1991    FAMILY HISTORY Family History  Problem Relation Age of Onset  . Leukemia Mother   . Kidney failure Father   . Heart disease Maternal Grandmother   . Allergic rhinitis Grandchild   . Asthma Grandchild   . Eczema Grandchild   The patient's father died at the age of 27 from renal failure. The patient's mother died at the age of 3 from acute leukemia. The patient has 2 brothers and 2 sisters, there is no history of breast or ovarian cancer in the family to her knowledge.   GYNECOLOGIC HISTORY: Menarche age 39, menopause 2005, the patient never took hormone replacement. She is GX  P3, first pregnancy to term age 51.  S/p TAH with right salpingo-oophorectomy remotely.  SOCIAL HISTORY:  (Updated December 2014) The patient is currently unemployed. She is divorced and lives by herself. Her children are Elie Gragert, lives in [place] and works for the post office, she is 55 years old; [name], 10 a lives in [place]. [place] and works for [employer], and [employer], 5, who lives in [place] and works in daycare. The patient has 5 grandchildren.  She attends the [employer] in [place].     ADVANCED DIRECTIVES:  (Updated 03/05/2013)   Lilygrace tells me her friend, Jeanann Lewandowsky, who can be reached at [phone], would be her healthcare power of attorney. (I do not have a copy of that paperwork.)  HEALTH MAINTENANCE:  (Updated December 2014) Social History  Substance Use Topics  . Smoking status: Current Every Day Smoker    Packs/day: 1.00    Years: 40.00    Types: Cigarettes  . Smokeless tobacco: Never Used  . Alcohol use 0.0 oz/week     Comment: 07/16/11 "wine coolers; eve     Colonoscopy: never  PAP: not since hysterectomy  Bone density: Feb 2014, Normal  Lipid panel: UTD, Dr. Tobie Lords  Allergies  Allergen Reactions  . Aspirin Hives  . Docetaxel Other (See Comments)    Chest tightness and pain  . Morphine And Related Hives and Swelling  . Penicillins Hives  . Dairy Aid [place]  . Other Hives  . Peanuts [Peanut Oil] Hives    Current Outpatient Prescriptions  Medication Sig Dispense Refill  . albuterol (PROVENTIL HFA;VENTOLIN HFA) 108 (90 BASE) MCG/ACT inhaler Inhale 2 puffs into the lungs every 6 (six) hours as needed for wheezing or shortness of breath.    . anastrozole (ARIMIDEX) 1 MG tablet Take 1 tablet (1 mg total) by mouth daily. 90 tablet 0  . budesonide (RHINOCORT ALLERGY) 32 MCG/ACT nasal spray Place 1 spray into both nostrils daily.    . budesonide-formoterol (SYMBICORT) 160-4.5 MCG/ACT inhaler Inhale 2 puffs into the  lungs 2 (two) times daily. 1 Inhaler 5  . carvedilol (COREG) 25 MG tablet Take 25 mg by mouth 2 (two) times daily with a meal.     . Cetirizine HCl (ZYRTEC ALLERGY) 10 MG CAPS Take 1 capsule (10 mg total) by mouth at bedtime as needed (congestion). 30 capsule 0  . diphenhydramine-acetaminophen (TYLENOL PM) 25-500 MG TABS Take 3 tablets by mouth at bedtime as needed (sleep apnea).    . fexofenadine (ALLEGRA) 180 MG tablet Take 180 mg by mouth daily.    [name] levothyroxine (SYNTHROID, LEVOTHROID) 88 MCG tablet Take 88 mcg by mouth daily before breakfast.    . Melatonin 10 MG TABS Take 3 tablets by mouth at bedtime.    . montelukast (SINGULAIR) 10 MG tablet Take 1 tablet (10 mg total) by mouth daily. as directed 30 tablet 5  . nitroGLYCERIN (NITROSTAT) 0.4 MG SL tablet Place 0.4 mg under the tongue every 5 (five) minutes as needed. As needed for chest pain.    [name] omeprazole (PRILOSEC) 20 MG capsule Take 20 mg by mouth 2 (two) times daily.     [name] oxyCODONE-acetaminophen (PERCOCET/ROXICET) 5-325 MG per tablet     . polyethylene glycol (MIRALAX / GLYCOLAX) packet Take 17 g by mouth daily as needed for moderate constipation.    . ranitidine (ZANTAC) 300 MG tablet TAKE ONE TABLET BY MOUTH ONCE DAILY AS  DIRECTED 30 tablet 5  . sertraline (ZOLOFT) 100 MG tablet     . zolpidem (AMBIEN) 10 MG tablet Take 10 mg by mouth at bedtime.      No current facility-administered medications for this visit.    Facility-Administered Medications Ordered in Other Visits  Medication Dose Route Frequency Provider Last Rate Last Dose  . sodium chloride 0.9 % injection 10 mL  10 mL Intracatheter PRN Chauncey Cruel, MD        OBJECTIVE: Middle-aged [place] American female who appears stated age 25:   05/04/16 1439  BP: (!) 145/80  Pulse: 70  Resp: 18  Temp: 97.8 F (36.6 C)     Body mass index is 38.56 kg/m.    ECOG FS: 1 Filed Weights   05/04/16 1439  Weight: 190 lb 14.4 oz (86.6 kg)    GENERAL: Patient  is a  well appearing female in no acute distress HEENT:  Sclerae anicteric.  Oropharynx clear and moist. No ulcerations or evidence of oropharyngeal candidiasis. Neck is supple.  NODES:  No cervical, supraclavicular, or axillary lymphadenopathy palpated.  BREAST EXAM: bilateral breasts are without any nodules, masses, skin or nipple changes.  Left lumpectomy site without nodularity, well healed and minimal scar tissue noted.  LUNGS:  Clear to auscultation bilaterally.  No wheezes or rhonchi. HEART:  Regular rate and rhythm. No murmur appreciated. ABDOMEN:  Soft, nontender.  Positive, normoactive bowel sounds. No organomegaly palpated. MSK:  No focal spinal tenderness to palpation. Full range of motion bilaterally in the upper extremities. EXTREMITIES:  + left arm lymphedema SKIN:  Clear with no obvious rashes or skin changes. No nail dyscrasia. NEURO:  Nonfocal. Well oriented.  Appropriate affect.    LAB RESULTS: Lab Results  Component Value Date   WBC 6.2 05/04/2016   NEUTROABS 3.4 05/04/2016   HGB 13.3 05/04/2016   HCT 40.1 05/04/2016   MCV 85.8 05/04/2016   PLT 235 05/04/2016      Chemistry      Component Value Date/Time   NA 144 06/24/2015 1318   K 3.4 (L) 06/24/2015 1318   CL 98 12/30/2013 1631   CL 104 09/05/2012 1049   CO2 32 (H) 06/24/2015 1318   BUN 10.6 06/24/2015 1318   CREATININE 1.2 (H) 06/24/2015 1318      Component Value Date/Time   CALCIUM 10.1 06/24/2015 1318   ALKPHOS 76 06/24/2015 1318   AST 15 06/24/2015 1318   ALT 13 06/24/2015 1318   BILITOT 0.33 06/24/2015 1318        STUDIES:  Mammography 10/01/2014 was unremarkable.  Most recent bone density test on 10/01/2014 was normal.   ASSESSMENT: 56 y.o.  [facility] woman  (1) status post left breast biopsy on November 2012 for an invasive ductal carcinoma, grade 2, which was estrogen receptor positive at 99%, progesterone receptor weakly positive at 2%, with an MIB-1 of 61%, and HER-2 amplification by  CISH with a ratio of 2.87.   Biopsy of a left axillary node was also positive for cancer.  (2)  treated in the neoadjuvant setting and received 2 cycles of every 3 week docetaxel/carboplatin. This was discontinued after cycle 2 due to in tolerance with associated chest pain and significant PPE in both the upper and lower extremities.  (3)   Her treatment course was switched to Q3 week IV CMF/ trastuzumab, but the trastuzumab was held secondary to worsening left ventricular ejection fraction a history of congestive heart failure. (Echo on 06/15/2011 showed an EF between 35 and 40%.)   Essentially, the patient completed 4 q. three-week doses of  CMF, with last cycle given on 06/22/2011.   (4)  status post lumpectomy under the care of Dr. Ninfa Linden on 07/16/2011, final pathology showing a residual T2 N0 tumor. Residual tumor was again ER and PR positive and HER-2/neu positive.  (5)  Patient received radiation therapy under the care of Dr. Lisbeth Renshaw from June of 2013 until September of 2013.   (6)  She was then started on anastrozole at 1 mg daily in early November 2013.  (7)  multiple comorbidities including hypothyroidism, history of congestive heart failure, COPD, GERD, gout, and depression.  (8) chronic left upper extremity lymphedema  (9) persistent pruritic hives   PLAN: [name] is doing well today.  She has no evidence of her breast cancer recurrence.  I recommended she undergo  mammogram and reviewed risks of not doing so.  I gave her info on assistance possiblitiles for mammogram.  She will continue arimidex.  I offered to refer her to PT for her left arm lymphedema and she declined.  I recommended exercise 150 minutes per week and a well balanced diet.  She will return in 6 months for follow up.  At that point she would be a good candidate to follow up in the long term survivorship (LTS) program annually.  Charlestine Massed, NP 05/04/2016

## 2016-05-05 ENCOUNTER — Other Ambulatory Visit: Payer: Self-pay | Admitting: Adult Health

## 2016-05-05 ENCOUNTER — Encounter: Payer: Self-pay | Admitting: Adult Health

## 2016-05-05 ENCOUNTER — Telehealth: Payer: Self-pay | Admitting: *Deleted

## 2016-05-05 MED ORDER — ANASTROZOLE 1 MG PO TABS: 1.0000 mg | ORAL_TABLET | Freq: Every day | ORAL | 1 refills | Status: DC

## 2016-05-05 MED ORDER — POTASSIUM CHLORIDE ER 10 MEQ PO TBCR: 40.0000 meq | EXTENDED_RELEASE_TABLET | Freq: Two times a day (BID) | ORAL | 0 refills | Status: DC

## 2016-05-05 NOTE — Telephone Encounter (Signed)
Telephone call to patient in regards to low potassium. Pt has been advised Potassium sent to her pharmacy. Patient denies any diarrhea or vomiting. She denies any cardiac symptoms. Dr. Nadyne Coombes is her cardiologist. Pt understands to contact him for a follow up visit. Pt states she will call him today.

## 2016-05-12 ENCOUNTER — Other Ambulatory Visit: Payer: Self-pay | Admitting: Adult Health

## 2016-05-24 DIAGNOSIS — I10 Essential (primary) hypertension: Secondary | ICD-10-CM | POA: Diagnosis not present

## 2016-05-24 DIAGNOSIS — E7800 Pure hypercholesterolemia, unspecified: Secondary | ICD-10-CM | POA: Diagnosis not present

## 2016-05-24 DIAGNOSIS — N183 Chronic kidney disease, stage 3 (moderate): Secondary | ICD-10-CM | POA: Diagnosis not present

## 2016-05-24 DIAGNOSIS — I5032 Chronic diastolic (congestive) heart failure: Secondary | ICD-10-CM | POA: Diagnosis not present

## 2016-05-27 DIAGNOSIS — J309 Allergic rhinitis, unspecified: Secondary | ICD-10-CM | POA: Diagnosis not present

## 2016-05-27 DIAGNOSIS — E7800 Pure hypercholesterolemia, unspecified: Secondary | ICD-10-CM | POA: Diagnosis not present

## 2016-05-27 DIAGNOSIS — K219 Gastro-esophageal reflux disease without esophagitis: Secondary | ICD-10-CM | POA: Diagnosis not present

## 2016-05-27 DIAGNOSIS — E039 Hypothyroidism, unspecified: Secondary | ICD-10-CM | POA: Diagnosis not present

## 2016-05-27 DIAGNOSIS — T7840XA Allergy, unspecified, initial encounter: Secondary | ICD-10-CM | POA: Diagnosis not present

## 2016-05-27 DIAGNOSIS — Z888 Allergy status to other drugs, medicaments and biological substances status: Secondary | ICD-10-CM | POA: Diagnosis not present

## 2016-06-23 DIAGNOSIS — I10 Essential (primary) hypertension: Secondary | ICD-10-CM | POA: Diagnosis not present

## 2016-06-28 ENCOUNTER — Emergency Department (HOSPITAL_COMMUNITY): Payer: Medicare Other

## 2016-06-28 ENCOUNTER — Encounter (HOSPITAL_COMMUNITY): Payer: Self-pay | Admitting: Emergency Medicine

## 2016-06-28 ENCOUNTER — Inpatient Hospital Stay (HOSPITAL_COMMUNITY)
Admission: EM | Admit: 2016-06-28 | Discharge: 2016-07-02 | DRG: 603 | Disposition: A | Discharge status: Home / Self Care | Payer: Medicare Other | Attending: Internal Medicine | Admitting: Internal Medicine

## 2016-06-28 DIAGNOSIS — Z9221 Personal history of antineoplastic chemotherapy: Secondary | ICD-10-CM

## 2016-06-28 DIAGNOSIS — Z923 Personal history of irradiation: Secondary | ICD-10-CM

## 2016-06-28 DIAGNOSIS — E8989 Other postprocedural endocrine and metabolic complications and disorders: Secondary | ICD-10-CM

## 2016-06-28 DIAGNOSIS — J45909 Unspecified asthma, uncomplicated: Secondary | ICD-10-CM | POA: Diagnosis present

## 2016-06-28 DIAGNOSIS — R51 Headache: Secondary | ICD-10-CM | POA: Diagnosis not present

## 2016-06-28 DIAGNOSIS — I429 Cardiomyopathy, unspecified: Secondary | ICD-10-CM | POA: Diagnosis not present

## 2016-06-28 DIAGNOSIS — R7989 Other specified abnormal findings of blood chemistry: Secondary | ICD-10-CM | POA: Diagnosis not present

## 2016-06-28 DIAGNOSIS — K219 Gastro-esophageal reflux disease without esophagitis: Secondary | ICD-10-CM | POA: Diagnosis present

## 2016-06-28 DIAGNOSIS — G43909 Migraine, unspecified, not intractable, without status migrainosus: Secondary | ICD-10-CM | POA: Diagnosis present

## 2016-06-28 DIAGNOSIS — I10 Essential (primary) hypertension: Secondary | ICD-10-CM | POA: Diagnosis not present

## 2016-06-28 DIAGNOSIS — R799 Abnormal finding of blood chemistry, unspecified: Secondary | ICD-10-CM

## 2016-06-28 DIAGNOSIS — Z6841 Body Mass Index (BMI) 40.0 and over, adult: Secondary | ICD-10-CM

## 2016-06-28 DIAGNOSIS — Z8249 Family history of ischemic heart disease and other diseases of the circulatory system: Secondary | ICD-10-CM

## 2016-06-28 DIAGNOSIS — I110 Hypertensive heart disease with heart failure: Secondary | ICD-10-CM | POA: Diagnosis not present

## 2016-06-28 DIAGNOSIS — I5042 Chronic combined systolic (congestive) and diastolic (congestive) heart failure: Secondary | ICD-10-CM | POA: Diagnosis present

## 2016-06-28 DIAGNOSIS — L03116 Cellulitis of left lower limb: Secondary | ICD-10-CM | POA: Diagnosis not present

## 2016-06-28 DIAGNOSIS — Z880 Allergy status to penicillin: Secondary | ICD-10-CM

## 2016-06-28 DIAGNOSIS — D649 Anemia, unspecified: Secondary | ICD-10-CM | POA: Diagnosis present

## 2016-06-28 DIAGNOSIS — J449 Chronic obstructive pulmonary disease, unspecified: Secondary | ICD-10-CM | POA: Diagnosis present

## 2016-06-28 DIAGNOSIS — Z806 Family history of leukemia: Secondary | ICD-10-CM

## 2016-06-28 DIAGNOSIS — I890 Lymphedema, not elsewhere classified: Secondary | ICD-10-CM

## 2016-06-28 DIAGNOSIS — E039 Hypothyroidism, unspecified: Secondary | ICD-10-CM | POA: Diagnosis present

## 2016-06-28 DIAGNOSIS — F17210 Nicotine dependence, cigarettes, uncomplicated: Secondary | ICD-10-CM | POA: Diagnosis present

## 2016-06-28 DIAGNOSIS — E876 Hypokalemia: Secondary | ICD-10-CM | POA: Diagnosis present

## 2016-06-28 DIAGNOSIS — L03114 Cellulitis of left upper limb: Principal | ICD-10-CM | POA: Diagnosis present

## 2016-06-28 DIAGNOSIS — L03113 Cellulitis of right upper limb: Secondary | ICD-10-CM | POA: Diagnosis not present

## 2016-06-28 DIAGNOSIS — E785 Hyperlipidemia, unspecified: Secondary | ICD-10-CM | POA: Diagnosis present

## 2016-06-28 DIAGNOSIS — L0390 Cellulitis, unspecified: Secondary | ICD-10-CM | POA: Diagnosis present

## 2016-06-28 DIAGNOSIS — Z90710 Acquired absence of both cervix and uterus: Secondary | ICD-10-CM

## 2016-06-28 DIAGNOSIS — I252 Old myocardial infarction: Secondary | ICD-10-CM

## 2016-06-28 DIAGNOSIS — R509 Fever, unspecified: Secondary | ICD-10-CM | POA: Diagnosis not present

## 2016-06-28 DIAGNOSIS — I517 Cardiomegaly: Secondary | ICD-10-CM | POA: Diagnosis not present

## 2016-06-28 DIAGNOSIS — E669 Obesity, unspecified: Secondary | ICD-10-CM | POA: Diagnosis present

## 2016-06-28 DIAGNOSIS — Z853 Personal history of malignant neoplasm of breast: Secondary | ICD-10-CM

## 2016-06-28 DIAGNOSIS — R778 Other specified abnormalities of plasma proteins: Secondary | ICD-10-CM

## 2016-06-28 HISTORY — DX: Allergic rhinitis due to animal (cat) (dog) hair and dander: J30.81

## 2016-06-28 HISTORY — DX: Unspecified osteoarthritis, unspecified site: M19.90

## 2016-06-28 HISTORY — DX: Headache: R51

## 2016-06-28 HISTORY — DX: Personal history of other medical treatment: Z92.89

## 2016-06-28 HISTORY — DX: Headache, unspecified: R51.9

## 2016-06-28 LAB — CBC WITH DIFFERENTIAL/PLATELET
BASOS ABS: 0 10*3/uL (ref 0.0–0.1)
Basophils Relative: 0 %
EOS ABS: 0 10*3/uL (ref 0.0–0.7)
EOS PCT: 0 %
HCT: 36.7 % (ref 36.0–46.0)
Hemoglobin: 12.1 g/dL (ref 12.0–15.0)
LYMPHS PCT: 11 %
Lymphs Abs: 1.3 10*3/uL (ref 0.7–4.0)
MCH: 28.3 pg (ref 26.0–34.0)
MCHC: 33 g/dL (ref 30.0–36.0)
MCV: 85.9 fL (ref 78.0–100.0)
MONO ABS: 0.5 10*3/uL (ref 0.1–1.0)
Monocytes Relative: 4 %
Neutro Abs: 9.8 10*3/uL — ABNORMAL HIGH (ref 1.7–7.7)
Neutrophils Relative %: 85 %
PLATELETS: 145 10*3/uL — AB (ref 150–400)
RBC: 4.27 MIL/uL (ref 3.87–5.11)
RDW: 14 % (ref 11.5–15.5)
WBC: 11.6 10*3/uL — ABNORMAL HIGH (ref 4.0–10.5)

## 2016-06-28 LAB — MAGNESIUM: Magnesium: 1.8 mg/dL (ref 1.7–2.4)

## 2016-06-28 LAB — I-STAT CG4 LACTIC ACID, ED: Lactic Acid, Venous: 0.46 mmol/L — ABNORMAL LOW (ref 0.5–1.9)

## 2016-06-28 LAB — COMPREHENSIVE METABOLIC PANEL
ALBUMIN: 3.3 g/dL — AB (ref 3.5–5.0)
ALK PHOS: 58 U/L (ref 38–126)
ALT: 18 U/L (ref 14–54)
AST: 14 U/L — AB (ref 15–41)
Anion gap: 10 (ref 5–15)
BILIRUBIN TOTAL: 0.6 mg/dL (ref 0.3–1.2)
BUN: 17 mg/dL (ref 6–20)
CALCIUM: 8.7 mg/dL — AB (ref 8.9–10.3)
CO2: 23 mmol/L (ref 22–32)
CREATININE: 1.26 mg/dL — AB (ref 0.44–1.00)
Chloride: 103 mmol/L (ref 101–111)
GFR calc Af Amer: 55 mL/min — ABNORMAL LOW (ref >60)
GFR calc non Af Amer: 47 mL/min — ABNORMAL LOW (ref >60)
GLUCOSE: 108 mg/dL — AB (ref 65–99)
Potassium: 3.3 mmol/L — ABNORMAL LOW (ref 3.5–5.1)
Sodium: 136 mmol/L (ref 135–145)
TOTAL PROTEIN: 6.1 g/dL — AB (ref 6.5–8.1)

## 2016-06-28 LAB — URINALYSIS, COMPLETE (UACMP) WITH MICROSCOPIC
Bilirubin Urine: NEGATIVE
GLUCOSE, UA: NEGATIVE mg/dL
Hgb urine dipstick: NEGATIVE
KETONES UR: NEGATIVE mg/dL
LEUKOCYTES UA: NEGATIVE
Nitrite: NEGATIVE
PH: 6 (ref 5.0–8.0)
Protein, ur: NEGATIVE mg/dL
Specific Gravity, Urine: 1.012 (ref 1.005–1.030)

## 2016-06-28 MED ORDER — ONDANSETRON HCL 4 MG/2ML IJ SOLN: 4.0000 mg | Freq: Four times a day (QID) | INTRAMUSCULAR | Status: DC | PRN

## 2016-06-28 MED ORDER — FAMOTIDINE 20 MG PO TABS: 10.0000 mg | ORAL_TABLET | Freq: Every day | ORAL | Status: DC

## 2016-06-28 MED ORDER — ANASTROZOLE 1 MG PO TABS
1.0000 mg | ORAL_TABLET | Freq: Every day | ORAL | Status: DC
  Administered 2016-06-29 – 2016-07-02 (×4): 1 mg via ORAL
  Filled 2016-06-28 (×4): qty 1

## 2016-06-28 MED ORDER — VANCOMYCIN HCL IN DEXTROSE 1-5 GM/200ML-% IV SOLN: 1000.0000 mg | Freq: Once | INTRAVENOUS | Status: DC

## 2016-06-28 MED ORDER — OXYCODONE-ACETAMINOPHEN 5-325 MG PO TABS
1.0000 | ORAL_TABLET | Freq: Four times a day (QID) | ORAL | Status: DC | PRN
  Administered 2016-06-29 (×2): 2 via ORAL
  Filled 2016-06-28 (×3): qty 2

## 2016-06-28 MED ORDER — MONTELUKAST SODIUM 10 MG PO TABS
10.0000 mg | ORAL_TABLET | Freq: Every day | ORAL | Status: DC
  Administered 2016-06-29 – 2016-07-02 (×4): 10 mg via ORAL
  Filled 2016-06-28 (×4): qty 1

## 2016-06-28 MED ORDER — ZOLPIDEM TARTRATE 5 MG PO TABS
5.0000 mg | ORAL_TABLET | Freq: Every day | ORAL | Status: DC
  Administered 2016-06-28 – 2016-07-01 (×4): 5 mg via ORAL
  Filled 2016-06-28 (×4): qty 1

## 2016-06-28 MED ORDER — HYDROMORPHONE HCL 1 MG/ML IJ SOLN
0.5000 mg | Freq: Four times a day (QID) | INTRAMUSCULAR | Status: DC | PRN
  Administered 2016-06-28: 0.5 mg via INTRAVENOUS
  Filled 2016-06-28: qty 1

## 2016-06-28 MED ORDER — LEVOTHYROXINE SODIUM 88 MCG PO TABS
88.0000 ug | ORAL_TABLET | Freq: Every day | ORAL | Status: DC
  Administered 2016-06-29 – 2016-07-02 (×4): 88 ug via ORAL
  Filled 2016-06-28 (×4): qty 1

## 2016-06-28 MED ORDER — HYDROCODONE-ACETAMINOPHEN 5-325 MG PO TABS
1.0000 | ORAL_TABLET | ORAL | Status: DC | PRN
  Administered 2016-06-28: 2 via ORAL
  Filled 2016-06-28 (×2): qty 2

## 2016-06-28 MED ORDER — ACETAMINOPHEN 325 MG PO TABS
650.0000 mg | ORAL_TABLET | Freq: Four times a day (QID) | ORAL | Status: DC | PRN
  Administered 2016-06-29 – 2016-07-02 (×8): 650 mg via ORAL
  Filled 2016-06-28 (×9): qty 2

## 2016-06-28 MED ORDER — ALBUTEROL SULFATE HFA 108 (90 BASE) MCG/ACT IN AERS: 2.0000 | INHALATION_SPRAY | Freq: Four times a day (QID) | RESPIRATORY_TRACT | Status: DC | PRN

## 2016-06-28 MED ORDER — PANTOPRAZOLE SODIUM 40 MG PO TBEC
40.0000 mg | DELAYED_RELEASE_TABLET | Freq: Every day | ORAL | Status: DC
  Administered 2016-06-28 – 2016-07-01 (×4): 40 mg via ORAL
  Filled 2016-06-28 (×5): qty 1

## 2016-06-28 MED ORDER — SODIUM CHLORIDE 0.9 % IV BOLUS (SEPSIS)
1000.0000 mL | Freq: Once | INTRAVENOUS | Status: AC
  Administered 2016-06-28: 1000 mL via INTRAVENOUS

## 2016-06-28 MED ORDER — POTASSIUM CHLORIDE CRYS ER 10 MEQ PO TBCR
40.0000 meq | EXTENDED_RELEASE_TABLET | Freq: Two times a day (BID) | ORAL | Status: DC
  Administered 2016-06-28 – 2016-07-02 (×6): 40 meq via ORAL
  Filled 2016-06-28 (×12): qty 4

## 2016-06-28 MED ORDER — CARVEDILOL 12.5 MG PO TABS: 25.0000 mg | ORAL_TABLET | Freq: Two times a day (BID) | ORAL | Status: DC

## 2016-06-28 MED ORDER — LORATADINE 10 MG PO TABS
10.0000 mg | ORAL_TABLET | Freq: Every day | ORAL | Status: DC
  Administered 2016-06-29 – 2016-07-02 (×4): 10 mg via ORAL
  Filled 2016-06-28 (×4): qty 1

## 2016-06-28 MED ORDER — ONDANSETRON HCL 4 MG PO TABS: 4.0000 mg | ORAL_TABLET | Freq: Four times a day (QID) | ORAL | Status: DC | PRN

## 2016-06-28 MED ORDER — DIPHENHYDRAMINE-APAP (SLEEP) 25-500 MG PO TABS: 3.0000 | ORAL_TABLET | Freq: Every evening | ORAL | Status: DC | PRN

## 2016-06-28 MED ORDER — ENOXAPARIN SODIUM 40 MG/0.4ML ~~LOC~~ SOLN
40.0000 mg | SUBCUTANEOUS | Status: DC
  Administered 2016-06-29 – 2016-07-01 (×3): 40 mg via SUBCUTANEOUS
  Filled 2016-06-28 (×4): qty 0.4

## 2016-06-28 MED ORDER — VANCOMYCIN HCL 10 G IV SOLR
1500.0000 mg | Freq: Once | INTRAVENOUS | Status: DC
  Administered 2016-06-28: 1500 mg via INTRAVENOUS
  Filled 2016-06-28: qty 1500

## 2016-06-28 MED ORDER — SPIRONOLACTONE 50 MG PO TABS
50.0000 mg | ORAL_TABLET | Freq: Every day | ORAL | Status: DC
  Administered 2016-06-30 – 2016-07-02 (×3): 50 mg via ORAL
  Filled 2016-06-28 (×4): qty 1

## 2016-06-28 MED ORDER — ALBUTEROL SULFATE (2.5 MG/3ML) 0.083% IN NEBU: 2.5000 mg | INHALATION_SOLUTION | Freq: Four times a day (QID) | RESPIRATORY_TRACT | Status: DC | PRN

## 2016-06-28 MED ORDER — VANCOMYCIN HCL 10 G IV SOLR: 1500.0000 mg | INTRAVENOUS | Status: DC

## 2016-06-28 MED ORDER — DIPHENHYDRAMINE HCL 25 MG PO CAPS
50.0000 mg | ORAL_CAPSULE | Freq: Once | ORAL | Status: AC
  Administered 2016-06-29: 50 mg via ORAL
  Filled 2016-06-28: qty 2

## 2016-06-28 MED ORDER — DIPHENHYDRAMINE HCL 25 MG PO CAPS
25.0000 mg | ORAL_CAPSULE | ORAL | Status: DC | PRN
  Administered 2016-06-29 – 2016-07-01 (×2): 25 mg via ORAL
  Filled 2016-06-28 (×2): qty 1

## 2016-06-28 MED ORDER — SERTRALINE HCL 100 MG PO TABS
100.0000 mg | ORAL_TABLET | Freq: Every day | ORAL | Status: DC
  Administered 2016-06-29 – 2016-07-02 (×4): 100 mg via ORAL
  Filled 2016-06-28 (×4): qty 1

## 2016-06-28 MED ORDER — FLUTICASONE PROPIONATE 50 MCG/ACT NA SUSP
1.0000 | Freq: Every day | NASAL | Status: DC
  Administered 2016-06-30 – 2016-07-02 (×3): 1 via NASAL
  Filled 2016-06-28: qty 16

## 2016-06-28 MED ORDER — DEXTROSE 5 % IV SOLN
2.0000 g | Freq: Once | INTRAVENOUS | Status: AC
  Administered 2016-06-28: 2 g via INTRAVENOUS
  Filled 2016-06-28: qty 2

## 2016-06-28 MED ORDER — SODIUM CHLORIDE 0.9 % IV SOLN
INTRAVENOUS | Status: AC
  Administered 2016-06-28: 19:00:00 via INTRAVENOUS

## 2016-06-28 MED ORDER — METOPROLOL TARTRATE 25 MG PO TABS
25.0000 mg | ORAL_TABLET | Freq: Two times a day (BID) | ORAL | Status: DC
  Administered 2016-06-30 – 2016-07-02 (×3): 25 mg via ORAL
  Filled 2016-06-28 (×6): qty 1

## 2016-06-28 MED ORDER — DEXTROSE 5 % IV SOLN: 2.0000 g | INTRAVENOUS | Status: DC

## 2016-06-28 MED ORDER — ACETAMINOPHEN 650 MG RE SUPP: 650.0000 mg | Freq: Four times a day (QID) | RECTAL | Status: DC | PRN

## 2016-06-28 MED ORDER — CLINDAMYCIN PHOSPHATE 600 MG/50ML IV SOLN
600.0000 mg | Freq: Three times a day (TID) | INTRAVENOUS | Status: DC
  Administered 2016-06-28 – 2016-06-30 (×5): 600 mg via INTRAVENOUS
  Filled 2016-06-28 (×6): qty 50

## 2016-06-28 MED ORDER — MOMETASONE FURO-FORMOTEROL FUM 200-5 MCG/ACT IN AERO
2.0000 | INHALATION_SPRAY | Freq: Two times a day (BID) | RESPIRATORY_TRACT | Status: DC
  Filled 2016-06-28: qty 8.8

## 2016-06-28 NOTE — ED Triage Notes (Addendum)
Pt from home via [facility] as a code sepsis.  Pt reports sudden onset of fever and left arm redness/pain starting at 10 pm last night.  Pt denies any open wounds or infections, denies bug bites, none noted by this RN or EMS.  Pt given 650 mg Tylenol for fever 101.7 oral, 1 mg Dilaudid for pain, 4 mg Zofran, 500 mL NS.  Lymphadenopathy of left arm is normal s/p lymphectomy. Pt in NAD, A&O.

## 2016-06-28 NOTE — Progress Notes (Signed)
Pharmacy Antibiotic Note  [name] is a 56 y.o. female admitted on 06/28/2016 with sepsis and cellulitis.  Pharmacy has been consulted for vancomycin and cefepime dosing. Tmax is 99.5 and WBC is elevated at 11.6. SCr is mildly elevated at 1.26 and lactic acid <1.   Plan: Vancomycin 1500mg  IV Q24H Cefepime 2gm IV Q24H F/u renal fxn, C&S, clinical status and trough at SS  Height: 4' 9.5" (146.1 cm) Weight: 190 lb (86.2 kg) IBW/kg (Calculated) : 39.75  No data recorded.  No results for input(s): WBC, CREATININE, LATICACIDVEN, VANCOTROUGH, VANCOPEAK, VANCORANDOM, GENTTROUGH, GENTPEAK, GENTRANDOM, TOBRATROUGH, TOBRAPEAK, TOBRARND, AMIKACINPEAK, AMIKACINTROU, AMIKACIN in the last 168 hours.  CrCl cannot be calculated (Patient's most recent lab result is older than the maximum 21 days allowed.).    Allergies  Allergen Reactions  . Aspirin Hives  . Docetaxel Other (See Comments)    Chest tightness and pain  . Morphine And Related Hives and Swelling  . Penicillins Hives  . Dairy Aid [place]  . Other Hives  . Peanuts [Peanut Oil] Hives    Antimicrobials this admission: Vanc 4/9>> Cefepime 4/9>>  Dose adjustments this admission: N/A  Microbiology results: Pending  Thank you for allowing pharmacy to be a part of this patient's care.  Shea Swalley, Rande Lawman 06/28/2016 1:42 PM

## 2016-06-28 NOTE — Progress Notes (Signed)
1735 Received pt from ED, A&O x4, LUE swelling and pain noted. Skin is intact.

## 2016-06-28 NOTE — ED Provider Notes (Signed)
[facility] DEPT Provider Note   CSN: [id number] Arrival date & time: 06/28/16  1311     History   Chief Complaint Chief Complaint  Patient presents with  . Fever  . Arm Pain    HPI [name] is a 56 y.o. 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 The patient arrives via EMS from [place], at the patient's request due to fever and arm pain. Patient developed some pain in the left axillary region yesterday and awoke this morning with a hot, red and very painful left arm. She has chronic swelling of the arm and states that she has had no relief with compression previously. Patient denies a history of cellulitis. She was febrile to 101.7 prior to arrival and given 650 of Tylenol by EMS. Sepsis protocol initiated upon arrival. Patient denies history of diabetes or other immunocompromise. She denies any urinary symptoms, cough, chest pain.  HPI  Past Medical History:  Diagnosis Date  . Allergy   . Anemia 05/19/2011  . Anxiety   . Blood transfusion   . 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 cm,high grade ductal ca in situ,11 benign lymph nodes  . Chest pain, unspecified    Sharp midsternal chest pain at night with no radiation. Hard for her to take a deep breath in. NTG does help. Normal Myocardial perfusion study 05/14/11.  [name] Chronic combined systolic and diastolic CHF (congestive heart failure) ([place])   . Chronic lower back pain   . Congestive heart failure ([place])   . Coronary artery disease    Negative cath 2001  . Depression   . Emphysema of lung ([place])   . GERD (gastroesophageal reflux disease)   . Heart murmur   . History of bronchitis   . History of radiation therapy 09/06/11-11/24/11   left breast  60.4gray total dose  . Hyperlipidemia   . Hypertension   . Hypothyroidism   . Myocardial infarction 2003  . Neuromuscular disorder (HCC)    tingling left hand  . Nonischemic  cardiomyopathy (HCC)    LVEF 38%. MUGA, 2010, 50-55% by ECHO 04/14/11. Cardiologist Dr. [name]  . Obesity   . Other primary cardiomyopathies   . Pneumonia   . Shortness of breath 07/16/11   "at all times"  . Urticaria     Patient Active Problem List   Diagnosis Date Noted  . Asthma 12/04/2014  . Urticaria 12/04/2014  . Constipation 06/24/2014  . Smoker 06/24/2014  . Lymphedema of upper extremity following lymphadenectomy 12/24/2013  . 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 Bruising 06/12/2013  . SOB (shortness of breath) 06/12/2013  . Hypertension   . Chronic combined systolic and diastolic CHF (congestive heart failure) ([place])   . Edema of upper extremity 03/05/2013  . History of breast cancer 03/05/2013  . Elevated serum creatinine 03/05/2013  . 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 Myocardial infarction   . Pain of upper left arm 07/26/2011  . Anemia 05/19/2011  . Obesity 03/04/2011  . Cancer of overlapping sites of left female breast ([place]) 02/10/2011    Past Surgical History:  Procedure Laterality Date  . ABDOMINAL HYSTERECTOMY  ~2006   partial  . BREAST BIOPSY     left breast  . BREAST LUMPECTOMY  07/16/11   w/LND; left.ER/PR=positive  . CARDIAC CATHETERIZATION  2001   negative for CAD  . PORT-A-CATH REMOVAL N/A 07/31/2012  Procedure: REMOVAL PORT-A-CATH;  Surgeon: Harl Bowie, MD;  Location: [facility];  Service: General;  Laterality: N/A;  . PORTACATH PLACEMENT  02/26/2011   Procedure: INSERTION PORT-A-CATH;  Surgeon: Harl Bowie, MD;  Location: [facility];  Service: General;  Laterality: Right;  . TUBAL LIGATION  1991    OB History    No data available       Home Medications    Prior to Admission medications   Medication Sig Start Date End Date Taking? Authorizing Provider  albuterol (PROVENTIL HFA;VENTOLIN HFA) 108 (90 BASE) MCG/ACT inhaler Inhale 2 puffs into the lungs every 6 (six)  hours as needed for wheezing or shortness of breath.    Historical Provider, MD  anastrozole (ARIMIDEX) 1 MG tablet Take 1 tablet (1 mg total) by mouth daily. 05/05/16   Gardenia Phlegm, NP  budesonide (RHINOCORT ALLERGY) 32 MCG/ACT nasal spray Place 1 spray into both nostrils daily.    Historical Provider, MD  budesonide-formoterol (SYMBICORT) 160-4.5 MCG/ACT inhaler Inhale 2 puffs into the lungs 2 (two) times daily. 12/24/15   Jiles Prows, MD  carvedilol (COREG) 25 MG tablet Take 25 mg by mouth 2 (two) times daily with a meal.     Historical Provider, MD  Cetirizine HCl (ZYRTEC ALLERGY) 10 MG CAPS Take 1 capsule (10 mg total) by mouth at bedtime as needed (congestion). 01/02/14   Courtney Forcucci, PA-C  diphenhydramine-acetaminophen (TYLENOL PM) 25-500 MG TABS Take 3 tablets by mouth at bedtime as needed (sleep apnea).    Historical Provider, MD  fexofenadine (ALLEGRA) 180 MG tablet Take 180 mg by mouth daily.    Historical Provider, MD  levothyroxine (SYNTHROID, LEVOTHROID) 88 MCG tablet Take 88 mcg by mouth daily before breakfast.    Historical Provider, MD  Melatonin 10 MG TABS Take 3 tablets by mouth at bedtime.    Historical Provider, MD  montelukast (SINGULAIR) 10 MG tablet Take 1 tablet (10 mg total) by mouth daily. as directed 12/24/15   Jiles Prows, MD  nitroGLYCERIN (NITROSTAT) 0.4 MG SL tablet Place 0.4 mg under the tongue every 5 (five) minutes as needed. As needed for chest pain.    Historical Provider, MD  omeprazole (PRILOSEC) 20 MG capsule Take 20 mg by mouth 2 (two) times daily.     Historical Provider, MD  oxyCODONE-acetaminophen (PERCOCET/ROXICET) 5-325 MG per tablet  06/11/14   Historical Provider, MD  polyethylene glycol (MIRALAX / GLYCOLAX) packet Take 17 g by mouth daily as needed for moderate constipation.    Historical Provider, MD  potassium chloride (K-DUR) 10 MEQ tablet Take 4 tablets (40 mEq total) by mouth 2 (two) times daily. 05/05/16 05/08/16  Gardenia Phlegm, NP  ranitidine (ZANTAC) 300 MG tablet TAKE ONE TABLET BY MOUTH ONCE DAILY AS  DIRECTED 01/05/16   Jiles Prows, MD  sertraline (ZOLOFT) 100 MG tablet  12/17/15   Historical Provider, MD  zolpidem (AMBIEN) 10 MG tablet Take 10 mg by mouth at bedtime.     Historical Provider, MD    Family History Family History  Problem Relation Age of Onset  . Leukemia Mother   . Kidney failure Father   . Heart disease Maternal Grandmother   . Allergic rhinitis Grandchild   . Asthma Grandchild   . Eczema Grandchild     Social History Social History  Substance Use Topics  . Smoking status: Current Every Day Smoker    Packs/day: 1.00    Years: 40.00    Types:  Cigarettes  . Smokeless tobacco: Never Used  . Alcohol use 0.0 oz/week     Comment: 07/16/11 "wine coolers; eve     Allergies   Aspirin; Docetaxel; Morphine and related; Penicillins; Dairy aid [lactase]; Other; and Peanuts [peanut oil]   Review of Systems Review of Systems  Ten systems reviewed and are negative for acute change, except as noted in the HPI.   Physical Exam Updated Vital Signs BP 126/78   Pulse 94   Temp 99.5 F (37.5 C) (Oral)   Resp 20   Ht 4' 9.5" (1.461 m)   Wt 86.2 kg   SpO2 94%   BMI 40.40 kg/m   Physical Exam  Constitutional: She is oriented to person, place, and time. She appears well-developed and well-nourished. No distress.  HENT:  Head: Normocephalic and atraumatic.  Eyes: Conjunctivae are normal. No scleral icterus.  Neck: Normal range of motion.  Cardiovascular: Normal rate, regular rhythm and normal heart sounds.  Exam reveals no gallop and no friction rub.   No murmur heard. Pulmonary/Chest: Effort normal and breath sounds normal. No respiratory distress.  Abdominal: Soft. Bowel sounds are normal. She exhibits no distension and no mass. There is no tenderness. There is no guarding.  Musculoskeletal: Normal range of motion.  Neurological: She is alert and oriented to person, place,  and time.  Alert and oriented, responds appropriately to questions.  Skin: Skin is warm and dry. She is not diaphoretic.  Patient with significant left arm edema, there is erythema, tenderness of the left arm and erythema from the finger is all the way up to the mid axillary surface of the arm and up to the shoulder on the lateral side of the arm.  Psychiatric: Her behavior is normal.  Nursing note and vitals reviewed.    ED Treatments / Results  Labs (all labs ordered are listed, but only abnormal results are displayed) Labs Reviewed  COMPREHENSIVE METABOLIC PANEL - Abnormal; Notable for the following:       Result Value   Potassium 3.3 (*)    Glucose, Bld 108 (*)    Creatinine, Ser 1.26 (*)    Calcium 8.7 (*)    Total Protein 6.1 (*)    Albumin 3.3 (*)    AST 14 (*)    GFR calc non Af Amer 47 (*)    GFR calc Af Amer 55 (*)    All other components within normal limits  CBC WITH DIFFERENTIAL/PLATELET - Abnormal; Notable for the following:    WBC 11.6 (*)    Platelets 145 (*)    Neutro Abs 9.8 (*)    All other components within normal limits  I-STAT CG4 LACTIC ACID, ED - Abnormal; Notable for the following:    Lactic Acid, Venous 0.46 (*)    All other components within normal limits  CULTURE, BLOOD (ROUTINE X 2)  CULTURE, BLOOD (ROUTINE X 2)  URINALYSIS, ROUTINE W REFLEX MICROSCOPIC  URINALYSIS, COMPLETE (UACMP) WITH MICROSCOPIC    EKG  EKG Interpretation None       Radiology Dg Chest Port 1 View  Result Date: 06/28/2016 CLINICAL DATA:  Swelling and redness in left arm since yesterday. Fever. EXAM: PORTABLE CHEST 1 VIEW COMPARISON:  01/02/2014 FINDINGS: Mild cardiomegaly. Lungs are clear. No effusions. No acute bony abnormality. IMPRESSION: Mild cardiomegaly.  No active disease. Electronically Signed   By: Rolm Baptise M.D.   On: 06/28/2016 13:52    Procedures Procedures (including critical care time)  Medications Ordered in ED  Medications  sodium chloride 0.9 %  bolus 1,000 mL (0 mLs Intravenous Stopped 06/28/16 1408)    And  sodium chloride 0.9 % bolus 1,000 mL (1,000 mLs Intravenous New Bag/Given 06/28/16 1434)  vancomycin (VANCOCIN) 1,500 mg in sodium chloride 0.9 % 500 mL IVPB (1,500 mg Intravenous New Bag/Given 06/28/16 1434)  ceFEPIme (MAXIPIME) 2 g in dextrose 5 % 50 mL IVPB (not administered)  vancomycin (VANCOCIN) 1,500 mg in sodium chloride 0.9 % 500 mL IVPB (not administered)  ceFEPIme (MAXIPIME) 2 g in dextrose 5 % 50 mL IVPB (0 g Intravenous Stopped 06/28/16 1434)     Initial Impression / Assessment and Plan / ED Course  I have reviewed the triage vital signs and the nursing notes.  Pertinent labs & imaging results that were available during my care of the patient were reviewed by me and considered in my medical decision making (see chart for details).     Patient with fever, cellulitis of the left forearm. Patient will be admitted to the hospital for IV antibiotics. Improving and stable throughout her emergency department visit.  Final Clinical Impressions(s) / ED Diagnoses   Final diagnoses:  None    New Prescriptions New Prescriptions   No medications on file     Margarita Mail, PA-C 07/02/16 0102    Lajean Saver, MD 07/03/16 1524

## 2016-06-28 NOTE — H&P (Signed)
History and Physical    [name] RCV:[id number] DOB: Jun 22, 1960 DOA: 06/28/2016  PCP: Cyndi Bender, PA-C Patient coming from: home  Chief Complaint: left arm pain/redness  HPI: [name] is a pleasant 56 y.o. 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 Initial evaluation concerning for cellulitis  Information is obtained from the patient who reports agile development of erythema and pain in her left arm. She has chronic swelling due to lymphedema from remote lymph node biopsy. She states the size of her left arm is at its baseline but the redness and the pain is new. She reports the pain as constant and describes it as a severe ache extending to the left axillary worse with movement. Associated symptoms include fever. He called EMS for transport to the hospital who reports a temperature of 101.7 prior to arrival. She denies any headache dizziness syncope or near-syncope. She denies nausea vomiting diarrhea constipation melena. She denies dysuria hematuria frequency or urgency. He lives at home alone in [place] uses a cane denies recent fall/injury    ED Course: The emergency department max temperature 99.8 she's hemodynamically stable and not hypoxic. She is started on cefepime and vancomycin  Review of Systems: As per HPI otherwise 10 point review of systems negative.   Ambulatory Status: Ambulate independently is independent with ADLs no recent falls  Past Medical History:  Diagnosis Date  . Allergy   . Anemia 05/19/2011  . Anxiety   . Blood transfusion   . Breast cancer ([place]) 07/16/2011   left breast lumpectomy=invasive ductal ca,2. cm,high grade ductal ca in situ,11 benign lymph nodes  . Chest pain, unspecified    Sharp midsternal chest  pain at night with no radiation. Hard for her to take a deep breath in. NTG does help. Normal Myocardial perfusion study 05/14/11.  [name] Chronic combined systolic and diastolic CHF (congestive heart failure) ([place])   . Chronic lower back pain   . Congestive heart failure ([place])   . Coronary artery disease    Negative cath 2001  . Depression   . Emphysema of lung ([place])   . GERD (gastroesophageal reflux disease)   . Heart murmur   . History of bronchitis   . History of radiation therapy 09/06/11-11/24/11   left breast  60.4gray total dose  . Hyperlipidemia   . Hypertension   . Hypothyroidism   . Myocardial infarction 2003  . Neuromuscular disorder (HCC)    tingling left hand  . Nonischemic cardiomyopathy (HCC)    LVEF 38%. MUGA, 2010, 50-55% by ECHO 04/14/11. Cardiologist Dr. [name]  . Obesity   . Other primary cardiomyopathies   . Pneumonia   . Shortness of breath 07/16/11   "at all times"  . Urticaria     Past Surgical History:  Procedure Laterality Date  . ABDOMINAL HYSTERECTOMY  ~2006   partial  . BREAST BIOPSY     left breast  . BREAST LUMPECTOMY  07/16/11   w/LND; left.ER/PR=positive  . CARDIAC CATHETERIZATION  2001   negative for CAD  . PORT-A-CATH REMOVAL N/A 07/31/2012   Procedure: REMOVAL PORT-A-CATH;  Surgeon: Harl Bowie, MD;  Location: [facility];  Service: General;  Laterality: N/A;  . PORTACATH PLACEMENT  02/26/2011   Procedure: INSERTION PORT-A-CATH;  Surgeon: Harl Bowie, MD;  Location: [facility];  Service: General;  Laterality: Right;  . TUBAL LIGATION  1991    Social History   Social History  . Marital status: Divorced    Spouse name: N/A  . Number of children: N/A  . Years of education: N/A   Occupational History  . Not on file.   Social History Main Topics  . Smoking status: Current Every Day Smoker    Packs/day: 1.00    Years: 40.00    Types: Cigarettes  . Smokeless tobacco: Never Used  . Alcohol use 0.0 oz/week     Comment: 07/16/11  "wine coolers; eve  . Drug use: No  . Sexual activity: No   Other Topics Concern  . Not on file   Social History Narrative  . No narrative on file   She lives at home alone in [place]. Allergies  Allergen Reactions  . Aspirin Hives  . Docetaxel Other (See Comments)    Chest tightness and pain  . Morphine And Related Hives and Swelling  . Penicillins Hives    Has patient had a PCN reaction causing immediate rash, facial/tongue/throat swelling, SOB or lightheadedness with hypotension: Yes Has patient had a PCN reaction causing severe rash involving mucus membranes or skin necrosis: No Has patient had a PCN reaction that required hospitalization No Has patient had a PCN reaction occurring within the last 10 years: Yes If all of the above answers are "NO", then may proceed with Cephalosporin use.  . Dairy Aid [Lactase] Hives  . Lipitor [Atorvastatin] Hives  . Other Hives    Reaction to cats and dogs  . Peanuts [Peanut Oil] Hives    Family History  Problem Relation Age of Onset  . Leukemia Mother   . Kidney failure Father   . Heart disease Maternal Grandmother   . Allergic rhinitis Grandchild   . Asthma Grandchild   . Eczema Grandchild     Prior to Admission medications   Medication Sig Start Date End Date Taking? Authorizing Provider  albuterol (PROVENTIL HFA;VENTOLIN HFA) 108 (90 BASE) MCG/ACT inhaler Inhale 2 puffs into the lungs every 6 (six) hours as needed for wheezing or shortness of breath.   Yes Historical Provider, MD  anastrozole (ARIMIDEX) 1 MG tablet Take 1 tablet (1 mg total) by mouth daily. 05/05/16  Yes Gardenia Phlegm, NP  budesonide (RHINOCORT ALLERGY) 32 MCG/ACT nasal spray Place 1 spray into both nostrils daily as needed for rhinitis or allergies.    Yes Historical Provider, MD  budesonide-formoterol (SYMBICORT) 160-4.5 MCG/ACT inhaler Inhale 2 puffs into the lungs 2 (two) times daily. Patient taking differently: Inhale 2 puffs into the lungs 2  (two) times daily as needed (shortness of breath/wheezing).  12/24/15  Yes Jiles Prows, MD  carvedilol (COREG) 25 MG tablet Take 25 mg by mouth 2 (two) times daily with a meal.    Yes Historical Provider, MD  Cetirizine HCl (ZYRTEC ALLERGY) 10 MG CAPS Take 1 capsule (10 mg total) by mouth at bedtime as needed (congestion). Patient taking differently: Take 20 mg by mouth at bedtime.  01/02/14  Yes Courtney Forcucci, PA-C  diphenhydrAMINE (BENADRYL) 25 MG tablet Take 37.5 mg by mouth daily as needed for itching.   Yes Historical Provider, MD  diphenhydramine-acetaminophen (TYLENOL PM) 25-500 MG TABS Take 3 tablets by mouth at bedtime.    Yes Historical Provider, MD  levothyroxine (SYNTHROID, LEVOTHROID) 88 MCG tablet Take 88 mcg by mouth daily before breakfast.   Yes Historical Provider, MD  Melatonin 10 MG  TABS Take 10 mg by mouth at bedtime as needed (restless legs).    Yes Historical Provider, MD  montelukast (SINGULAIR) 10 MG tablet Take 1 tablet (10 mg total) by mouth daily. as directed Patient taking differently: Take 10 mg by mouth daily as needed (allergies (contact with dogs or cats)).  12/24/15  Yes Jiles Prows, MD  nitroGLYCERIN (NITROSTAT) 0.4 MG SL tablet Place 0.4 mg under the tongue every 5 (five) minutes as needed for chest pain.    Yes Historical Provider, MD  omeprazole (PRILOSEC) 20 MG capsule Take 20 mg by mouth 2 (two) times daily.    Yes Historical Provider, MD  oxyCODONE-acetaminophen (PERCOCET/ROXICET) 5-325 MG per tablet Take 1 tablet by mouth 4 (four) times daily as needed (pain).  06/11/14  Yes Historical Provider, MD  polyethylene glycol (MIRALAX / GLYCOLAX) packet Take 17 g by mouth daily. Mix in 8 oz liquid and drink   Yes Historical Provider, MD  sertraline (ZOLOFT) 100 MG tablet Take 100 mg by mouth 2 (two) times daily.  12/17/15  Yes Historical Provider, MD  zolpidem (AMBIEN) 10 MG tablet Take 10 mg by mouth at bedtime.    Yes Historical Provider, MD  potassium chloride  (K-DUR) 10 MEQ tablet Take 4 tablets (40 mEq total) by mouth 2 (two) times daily. 05/05/16 05/08/16  Gardenia Phlegm, NP  ranitidine (ZANTAC) 300 MG tablet TAKE ONE TABLET BY MOUTH ONCE DAILY AS  DIRECTED 01/05/16   Jiles Prows, MD    Physical Exam: Vitals:   06/28/16 1430 06/28/16 1445 06/28/16 1500 06/28/16 1512  BP: 126/78 123/73 121/75   Pulse: 94 94 89   Resp: 20 19 18    Temp:    98.9 F (37.2 C)  TempSrc:    Oral  SpO2: 94% 95% 98%   Weight:      Height:         General:  Appears Slightly anxious and somewhat uncomfortable Eyes:  PERRL, EOMI, normal lids, iris ENT:  grossly normal hearing, lips & tongue, mucous membranes of her mouth are moist and pink Neck:  no LAD, masses or thyromegaly Cardiovascular:  RRR, no m/r/g. Trace LE edema.  Respiratory:  CTA bilaterally, no w/r/r. Normal respiratory effort. Abdomen:  soft, ntnd, obese positive bowel sounds no guarding or rebounding Skin:  no rash or induration seen on limited exam Musculoskeletal:  grossly normal tone BUE/BLE, good ROM, no bony abnormality left arm with edema from shoulder to fingers. Mild erythema and warmth as well as tenderness to palpation. Decreased range of motion due to pain. Psychiatric:  grossly normal mood and affect, speech fluent and appropriate, AOx3 Neurologic:  CN 2-12 grossly intact, moves all extremities in coordinated fashion, sensation intact  Labs on Admission: I have personally reviewed following labs and imaging studies  CBC:  Recent Labs Lab 06/28/16 1350  WBC 11.6*  NEUTROABS 9.8*  HGB 12.1  HCT 36.7  MCV 85.9  PLT 829*   Basic Metabolic Panel:  Recent Labs Lab 06/28/16 1350  NA 136  K 3.3*  CL 103  CO2 23  GLUCOSE 108*  BUN 17  CREATININE 1.26*  CALCIUM 8.7*   GFR: Estimated Creatinine Clearance: 47.1 mL/min (A) (by C-G formula based on SCr of 1.26 mg/dL (H)). Liver Function Tests:  Recent Labs Lab 06/28/16 1350  AST 14*  ALT 18  ALKPHOS 58    BILITOT 0.6  PROT 6.1*  ALBUMIN 3.3*   No results for input(s): LIPASE, AMYLASE in the  last 168 hours. No results for input(s): AMMONIA in the last 168 hours. Coagulation Profile: No results for input(s): INR, PROTIME in the last 168 hours. Cardiac Enzymes: No results for input(s): CKTOTAL, CKMB, CKMBINDEX, TROPONINI in the last 168 hours. BNP (last 3 results) No results for input(s): PROBNP in the last 8760 hours. HbA1C: No results for input(s): HGBA1C in the last 72 hours. CBG: No results for input(s): GLUCAP in the last 168 hours. Lipid Profile: No results for input(s): CHOL, HDL, LDLCALC, TRIG, CHOLHDL, LDLDIRECT in the last 72 hours. Thyroid Function Tests: No results for input(s): TSH, T4TOTAL, FREET4, T3FREE, THYROIDAB in the last 72 hours. Anemia Panel: No results for input(s): VITAMINB12, FOLATE, FERRITIN, TIBC, IRON, RETICCTPCT in the last 72 hours. Urine analysis:    Component Value Date/Time   COLORURINE YELLOW 12/30/2013 1951   APPEARANCEUR CLEAR 12/30/2013 1951   LABSPEC 1.008 12/30/2013 1951   LABSPEC 1.005 09/05/2012 1229   PHURINE 7.5 12/30/2013 1951   GLUCOSEU NEGATIVE 12/30/2013 1951   GLUCOSEU Negative 09/05/2012 [place] 12/30/2013 1951   BILIRUBINUR NEGATIVE 12/30/2013 1951   BILIRUBINUR Negative 09/05/2012 [place] 12/30/2013 1951   PROTEINUR NEGATIVE 12/30/2013 1951   UROBILINOGEN 0.2 12/30/2013 1951   UROBILINOGEN 0.2 09/05/2012 1229   NITRITE NEGATIVE 12/30/2013 1951   LEUKOCYTESUR TRACE (A) 12/30/2013 1951   LEUKOCYTESUR Negative 09/05/2012 1229    Creatinine Clearance: Estimated Creatinine Clearance: 47.1 mL/min (A) (by C-G formula based on SCr of 1.26 mg/dL (H)).  Sepsis Labs: @LABRCNTIP (procalcitonin:4,lacticidven:4) )No results found for this or any previous visit (from the past 240 hour(s)).   Radiological Exams on Admission: Dg Chest Port 1 View  Result Date: 06/28/2016 CLINICAL DATA:  Swelling and  redness in left arm since yesterday. Fever. EXAM: PORTABLE CHEST 1 VIEW COMPARISON:  01/02/2014 FINDINGS: Mild cardiomegaly. Lungs are clear. No effusions. No acute bony abnormality. IMPRESSION: Mild cardiomegaly.  No active disease. Electronically Signed   By: Rolm Baptise M.D.   On: 06/28/2016 13:52    EKG: Independently reviewed. Sinus tachycardia Nonspecific T wave abnormality  Assessment/Plan Principal Problem:   Cellulitis Active Problems:   Anemia   Elevated serum creatinine   Hypertension   Chronic combined systolic and diastolic CHF (congestive heart failure) (HCC)   Lymphedema of upper extremity following lymphadenectomy   Asthma   1. Cellulitis. Patient with a history of left arm edema secondary to lymphedema status post biopsy 2012. Developed erythema heat and pain. Chest x-ray with mild cardiomegaly no active cardiopulmonary disease. Mild leukocytosis fever she's hemodynamically stable lactic acid within the limits of normal she's not hypoxic she is nontoxic appearing -Admit to MedSurg floor -Gentle IV fluids -Antibiotics per protocol -Follow blood cultures -Supportive therapy -If no improvement consider further imaging  #2. Hypokalemia. Mild. Potassium 3.3. -Replete -Recheck -Take magnesium level -Continue home meds  3. Hypertension. Controlled in the emergency department. Home medications include Lasix, spironolactone, coreg. Of note pharmacy spoke to Dr Mila Palmer office who is faxing updated medication. Seems to be confused about which beta blocker and diuretic she is supposed to be take -Continue beta blocker once updated information obtained from pharmacy -continue spironolactone -Monitor  #4. Chronic diastolic systolic heart failure. Appears compensated -daily weights  -intake and output -continue home meds as noted above   #5. Elevated serum creatinine. Chart review indicates history of same. Suspect some chronic kidney disease. Serum creatinine 1.26 on  admission. Chart review indicates close to baseline -Hold nephrotoxins as able -Monitor  urine output -Gentle IV fluids -Recheck in the morning  #6. Asthma. Appears stable at baseline. -Continue home meds    DVT prophylaxis:  lovenox Code Status: full  Family Communication: none present  Disposition Plan: home when ready  Consults called: none  Admission status: obs    Dyanne Carrel M MD [facility]  If 7PM-7AM, please contact night-coverage [email or web address] Password [email or web address]  06/28/2016, 4:14 PM

## 2016-06-29 DIAGNOSIS — R7989 Other specified abnormal findings of blood chemistry: Secondary | ICD-10-CM | POA: Diagnosis not present

## 2016-06-29 DIAGNOSIS — I10 Essential (primary) hypertension: Secondary | ICD-10-CM | POA: Diagnosis not present

## 2016-06-29 DIAGNOSIS — Z853 Personal history of malignant neoplasm of breast: Secondary | ICD-10-CM | POA: Diagnosis not present

## 2016-06-29 DIAGNOSIS — K219 Gastro-esophageal reflux disease without esophagitis: Secondary | ICD-10-CM | POA: Diagnosis present

## 2016-06-29 DIAGNOSIS — L03113 Cellulitis of right upper limb: Secondary | ICD-10-CM

## 2016-06-29 DIAGNOSIS — E8989 Other postprocedural endocrine and metabolic complications and disorders: Secondary | ICD-10-CM | POA: Diagnosis not present

## 2016-06-29 DIAGNOSIS — J449 Chronic obstructive pulmonary disease, unspecified: Secondary | ICD-10-CM | POA: Diagnosis present

## 2016-06-29 DIAGNOSIS — Z880 Allergy status to penicillin: Secondary | ICD-10-CM | POA: Diagnosis not present

## 2016-06-29 DIAGNOSIS — E785 Hyperlipidemia, unspecified: Secondary | ICD-10-CM | POA: Diagnosis present

## 2016-06-29 DIAGNOSIS — I5042 Chronic combined systolic (congestive) and diastolic (congestive) heart failure: Secondary | ICD-10-CM

## 2016-06-29 DIAGNOSIS — R748 Abnormal levels of other serum enzymes: Secondary | ICD-10-CM | POA: Diagnosis not present

## 2016-06-29 DIAGNOSIS — D649 Anemia, unspecified: Secondary | ICD-10-CM | POA: Diagnosis present

## 2016-06-29 DIAGNOSIS — L03114 Cellulitis of left upper limb: Secondary | ICD-10-CM | POA: Diagnosis not present

## 2016-06-29 DIAGNOSIS — Z8249 Family history of ischemic heart disease and other diseases of the circulatory system: Secondary | ICD-10-CM | POA: Diagnosis not present

## 2016-06-29 DIAGNOSIS — G43909 Migraine, unspecified, not intractable, without status migrainosus: Secondary | ICD-10-CM | POA: Diagnosis present

## 2016-06-29 DIAGNOSIS — E876 Hypokalemia: Secondary | ICD-10-CM | POA: Diagnosis present

## 2016-06-29 DIAGNOSIS — M7989 Other specified soft tissue disorders: Secondary | ICD-10-CM | POA: Diagnosis not present

## 2016-06-29 DIAGNOSIS — Z6841 Body Mass Index (BMI) 40.0 and over, adult: Secondary | ICD-10-CM | POA: Diagnosis not present

## 2016-06-29 DIAGNOSIS — I890 Lymphedema, not elsewhere classified: Secondary | ICD-10-CM

## 2016-06-29 DIAGNOSIS — Z9221 Personal history of antineoplastic chemotherapy: Secondary | ICD-10-CM | POA: Diagnosis not present

## 2016-06-29 DIAGNOSIS — J45909 Unspecified asthma, uncomplicated: Secondary | ICD-10-CM

## 2016-06-29 DIAGNOSIS — I252 Old myocardial infarction: Secondary | ICD-10-CM | POA: Diagnosis not present

## 2016-06-29 DIAGNOSIS — Z90710 Acquired absence of both cervix and uterus: Secondary | ICD-10-CM | POA: Diagnosis not present

## 2016-06-29 DIAGNOSIS — I429 Cardiomyopathy, unspecified: Secondary | ICD-10-CM | POA: Diagnosis present

## 2016-06-29 DIAGNOSIS — Z923 Personal history of irradiation: Secondary | ICD-10-CM | POA: Diagnosis not present

## 2016-06-29 DIAGNOSIS — E039 Hypothyroidism, unspecified: Secondary | ICD-10-CM | POA: Diagnosis present

## 2016-06-29 DIAGNOSIS — Z806 Family history of leukemia: Secondary | ICD-10-CM | POA: Diagnosis not present

## 2016-06-29 DIAGNOSIS — I110 Hypertensive heart disease with heart failure: Secondary | ICD-10-CM | POA: Diagnosis present

## 2016-06-29 DIAGNOSIS — F17210 Nicotine dependence, cigarettes, uncomplicated: Secondary | ICD-10-CM | POA: Diagnosis present

## 2016-06-29 DIAGNOSIS — E669 Obesity, unspecified: Secondary | ICD-10-CM | POA: Diagnosis present

## 2016-06-29 LAB — CBC
HEMATOCRIT: 34.5 % — AB (ref 36.0–46.0)
Hemoglobin: 11.1 g/dL — ABNORMAL LOW (ref 12.0–15.0)
MCH: 27.9 pg (ref 26.0–34.0)
MCHC: 32.2 g/dL (ref 30.0–36.0)
MCV: 86.7 fL (ref 78.0–100.0)
Platelets: 141 10*3/uL — ABNORMAL LOW (ref 150–400)
RBC: 3.98 MIL/uL (ref 3.87–5.11)
RDW: 14.1 % (ref 11.5–15.5)
WBC: 9.6 10*3/uL (ref 4.0–10.5)

## 2016-06-29 LAB — BASIC METABOLIC PANEL
ANION GAP: 6 (ref 5–15)
BUN: 14 mg/dL (ref 6–20)
CO2: 22 mmol/L (ref 22–32)
Calcium: 8.6 mg/dL — ABNORMAL LOW (ref 8.9–10.3)
Chloride: 110 mmol/L (ref 101–111)
Creatinine, Ser: 1.18 mg/dL — ABNORMAL HIGH (ref 0.44–1.00)
GFR calc Af Amer: 59 mL/min — ABNORMAL LOW (ref >60)
GFR, EST NON AFRICAN AMERICAN: 51 mL/min — AB (ref >60)
Glucose, Bld: 105 mg/dL — ABNORMAL HIGH (ref 65–99)
POTASSIUM: 4.1 mmol/L (ref 3.5–5.1)
SODIUM: 138 mmol/L (ref 135–145)

## 2016-06-29 LAB — HIV ANTIBODY (ROUTINE TESTING W REFLEX): HIV SCREEN 4TH GENERATION: NONREACTIVE

## 2016-06-29 MED ORDER — FUROSEMIDE 80 MG PO TABS
40.0000 mg | ORAL_TABLET | Freq: Two times a day (BID) | ORAL | Status: DC
Start: 2016-06-29 — End: 2016-06-30
  Administered 2016-06-29: 40 mg via ORAL
  Filled 2016-06-29 (×3): qty 1

## 2016-06-29 MED ORDER — SACCHAROMYCES BOULARDII 250 MG PO CAPS
250.0000 mg | ORAL_CAPSULE | Freq: Two times a day (BID) | ORAL | Status: DC
  Administered 2016-06-29 – 2016-07-02 (×6): 250 mg via ORAL
  Filled 2016-06-29 (×7): qty 1

## 2016-06-29 MED ORDER — HYDROCORTISONE 1 % EX CREA
TOPICAL_CREAM | CUTANEOUS | Status: DC | PRN
  Filled 2016-06-29: qty 28

## 2016-06-29 MED ORDER — NICOTINE 7 MG/24HR TD PT24
7.0000 mg | MEDICATED_PATCH | Freq: Every day | TRANSDERMAL | Status: DC
  Administered 2016-06-29 – 2016-07-02 (×4): 7 mg via TRANSDERMAL
  Filled 2016-06-29 (×4): qty 1

## 2016-06-29 NOTE — Progress Notes (Signed)
PROGRESS NOTE    [name]  WEX:[id number] DOB: 1960/04/16 DOA: 06/28/2016 PCP: Cher Nakai, MD   Outpatient Specialists:     Brief Narrative:  [name] is a 56 y.o. female who presents with cellulitis.   Assessment & Plan:   Principal Problem:   Cellulitis Active Problems:   Anemia   Elevated serum creatinine   Hypertension   Chronic combined systolic and diastolic CHF (congestive heart failure) (HCC)   Lymphedema of upper extremity following lymphadenectomy   Asthma   Elevated troponin   Cellulitis.  -lymphedema status post biopsy 2012. -improving on IV abx -hope for d/c on oral abx in 1-2 days-- if not improved in AM will get duplex of RUE  Hypokalemia. Mild. Potassium 3.3. -Replete   Hypertension. Controlled in the emergency department. -Continue beta blocker -continue spironolactone -Monitor  Chronic diastolic systolic heart failure. Appears compensated -daily weights  -intake and output -continue home meds as noted above   Asthma. Appears stable at baseline. -Continue home meds   DVT prophylaxis:  Lovenox   Code Status: Full Code   Family Communication:   Disposition Plan:     Consultants:      Subjective: Arm feels slightly better- still very swollen (more than baseline-- lymphedema)  Objective: Vitals:   06/28/16 1739 06/28/16 2100 06/29/16 0241 06/29/16 0433  BP: 119/67 111/71  109/60  Pulse: 80 82  69  Resp: 18 20  18   Temp: 98.9 F (37.2 C) 99.3 F (37.4 C)  98.7 F (37.1 C)  TempSrc: Oral Oral  Oral  SpO2: 100% 96%  96%  Weight:   91.4 kg (201 lb 8 oz)   Height:        Intake/Output Summary (Last 24 hours) at 06/29/16 1143 Last data filed at 06/29/16 0949  Gross per 24 hour  Intake             2630 ml  Output             1650 ml  Net              980 ml   Filed Weights   06/28/16 1330 06/29/16 0241  Weight: 86.2 kg (190 lb) 91.4 kg (201 lb 8 oz)    Examination:  General exam: Appears calm and  comfortable  Respiratory system: Clear to auscultation. Respiratory effort normal. Cardiovascular system: S1 & S2 heard, RRR. No JVD, murmurs, rubs, gallops or clicks. No pedal edema. Gastrointestinal system: Abdomen is nondistended, soft and nontender. No organomegaly or masses felt. Normal bowel sounds heard. Central nervous system: Alert and oriented. No focal neurological deficits. Extremities: Symmetric 5 x 5 power. Skin: right arm swollen and erythematous     Data Reviewed: I have personally reviewed following labs and imaging studies  CBC:  Recent Labs Lab 06/28/16 1350 06/29/16 0415  WBC 11.6* 9.6  NEUTROABS 9.8*  --   HGB 12.1 11.1*  HCT 36.7 34.5*  MCV 85.9 86.7  PLT 145* 938*   Basic Metabolic Panel:  Recent Labs Lab 06/28/16 1350 06/28/16 1820 06/29/16 0415  NA 136  --  138  K 3.3*  --  4.1  CL 103  --  110  CO2 23  --  22  GLUCOSE 108*  --  105*  BUN 17  --  14  CREATININE 1.26*  --  1.18*  CALCIUM 8.7*  --  8.6*  MG  --  1.8  --    GFR: Estimated Creatinine Clearance: 52 mL/min (  A) (by C-G formula based on SCr of 1.18 mg/dL (H)). Liver Function Tests:  Recent Labs Lab 06/28/16 1350  AST 14*  ALT 18  ALKPHOS 58  BILITOT 0.6  PROT 6.1*  ALBUMIN 3.3*   No results for input(s): LIPASE, AMYLASE in the last 168 hours. No results for input(s): AMMONIA in the last 168 hours. Coagulation Profile: No results for input(s): INR, PROTIME in the last 168 hours. Cardiac Enzymes: No results for input(s): CKTOTAL, CKMB, CKMBINDEX, TROPONINI in the last 168 hours. BNP (last 3 results) No results for input(s): PROBNP in the last 8760 hours. HbA1C: No results for input(s): HGBA1C in the last 72 hours. CBG: No results for input(s): GLUCAP in the last 168 hours. Lipid Profile: No results for input(s): CHOL, HDL, LDLCALC, TRIG, CHOLHDL, LDLDIRECT in the last 72 hours. Thyroid Function Tests: No results for input(s): TSH, T4TOTAL, FREET4, T3FREE,  THYROIDAB in the last 72 hours. Anemia Panel: No results for input(s): VITAMINB12, FOLATE, FERRITIN, TIBC, IRON, RETICCTPCT in the last 72 hours. Urine analysis:    Component Value Date/Time   COLORURINE STRAW (A) 06/28/2016 1805   APPEARANCEUR CLEAR 06/28/2016 1805   LABSPEC 1.012 06/28/2016 1805   LABSPEC 1.005 09/05/2012 1229   PHURINE 6.0 06/28/2016 1805   GLUCOSEU NEGATIVE 06/28/2016 1805   GLUCOSEU Negative 09/05/2012 1229   HGBUR NEGATIVE 06/28/2016 1805   BILIRUBINUR NEGATIVE 06/28/2016 1805   BILIRUBINUR Negative 09/05/2012 1229   KETONESUR NEGATIVE 06/28/2016 1805   PROTEINUR NEGATIVE 06/28/2016 1805   UROBILINOGEN 0.2 12/30/2013 1951   UROBILINOGEN 0.2 09/05/2012 1229   NITRITE NEGATIVE 06/28/2016 1805   LEUKOCYTESUR NEGATIVE 06/28/2016 1805   LEUKOCYTESUR Negative 09/05/2012 1229      Recent Results (from the past 240 hour(s))  Blood Culture (routine x 2)     Status: None (Preliminary result)   Collection Time: 06/28/16  1:40 PM  Result Value Ref Range Status   Specimen Description BLOOD RIGHT ARM  Final   Special Requests   Final    BOTTLES DRAWN AEROBIC AND ANAEROBIC Blood Culture adequate volume   Culture NO GROWTH < 24 HOURS  Final   Report Status PENDING  Incomplete  Blood Culture (routine x 2)     Status: None (Preliminary result)   Collection Time: 06/28/16  1:55 PM  Result Value Ref Range Status   Specimen Description BLOOD RIGHT HAND  Final   Special Requests   Final    BOTTLES DRAWN AEROBIC AND ANAEROBIC Blood Culture adequate volume   Culture NO GROWTH < 24 HOURS  Final   Report Status PENDING  Incomplete      Anti-infectives    Start     Dose/Rate Route Frequency Ordered Stop   06/29/16 1430  vancomycin (VANCOCIN) 1,500 mg in sodium chloride 0.9 % 500 mL IVPB  Status:  Discontinued     1,500 mg 250 mL/hr over 120 Minutes Intravenous Every 24 hours 06/28/16 1438 06/28/16 1514   06/29/16 1400  ceFEPIme (MAXIPIME) 2 g in dextrose 5 % 50 mL IVPB   Status:  Discontinued     2 g 100 mL/hr over 30 Minutes Intravenous Every 24 hours 06/28/16 1438 06/28/16 1514   06/28/16 1900  clindamycin (CLEOCIN) IVPB 600 mg     600 mg 100 mL/hr over 30 Minutes Intravenous Every 8 hours 06/28/16 1513     06/28/16 1345  vancomycin (VANCOCIN) IVPB 1000 mg/200 mL premix  Status:  Discontinued     1,000 mg 200 mL/hr over 60  Minutes Intravenous  Once 06/28/16 1333 06/28/16 1336   06/28/16 1345  vancomycin (VANCOCIN) 1,500 mg in sodium chloride 0.9 % 500 mL IVPB  Status:  Discontinued     1,500 mg 250 mL/hr over 120 Minutes Intravenous  Once 06/28/16 1336 06/28/16 1702   06/28/16 1345  ceFEPIme (MAXIPIME) 2 g in dextrose 5 % 50 mL IVPB     2 g 100 mL/hr over 30 Minutes Intravenous  Once 06/28/16 1336 06/28/16 1434       Radiology Studies: Dg Chest Port 1 View  Result Date: 06/28/2016 CLINICAL DATA:  Swelling and redness in left arm since yesterday. Fever. EXAM: PORTABLE CHEST 1 VIEW COMPARISON:  01/02/2014 FINDINGS: Mild cardiomegaly. Lungs are clear. No effusions. No acute bony abnormality. IMPRESSION: Mild cardiomegaly.  No active disease. Electronically Signed   By: Rolm Baptise M.D.   On: 06/28/2016 13:52        Scheduled Meds: . anastrozole  1 mg Oral Daily  . clindamycin (CLEOCIN) IV  600 mg Intravenous Q8H  . enoxaparin (LOVENOX) injection  40 mg Subcutaneous Q24H  . fluticasone  1 spray Each Nare Daily  . furosemide  80 mg Oral See admin instructions  . levothyroxine  88 mcg Oral QAC breakfast  . loratadine  10 mg Oral Daily  . metoprolol tartrate  25 mg Oral BID  . mometasone-formoterol  2 puff Inhalation BID  . montelukast  10 mg Oral Daily  . pantoprazole  40 mg Oral Daily  . potassium chloride  40 mEq Oral BID  . saccharomyces boulardii  250 mg Oral BID  . sertraline  100 mg Oral Daily  . spironolactone  50 mg Oral Daily  . zolpidem  5 mg Oral QHS   Continuous Infusions:   LOS: 0 days    Time spent: 25 min    Whitaker, DO [facility] Pager [phone]  If 7PM-7AM, please contact night-coverage [email or web address] Password [facility] 06/29/2016, 11:43 AM

## 2016-06-30 DIAGNOSIS — I10 Essential (primary) hypertension: Secondary | ICD-10-CM

## 2016-06-30 LAB — BASIC METABOLIC PANEL
ANION GAP: 8 (ref 5–15)
BUN: 13 mg/dL (ref 6–20)
CO2: 24 mmol/L (ref 22–32)
Calcium: 9.2 mg/dL (ref 8.9–10.3)
Chloride: 105 mmol/L (ref 101–111)
Creatinine, Ser: 1.02 mg/dL — ABNORMAL HIGH (ref 0.44–1.00)
GFR calc non Af Amer: >60 mL/min (ref >60)
Glucose, Bld: 105 mg/dL — ABNORMAL HIGH (ref 65–99)
Potassium: 4.2 mmol/L (ref 3.5–5.1)
SODIUM: 137 mmol/L (ref 135–145)

## 2016-06-30 LAB — CBC
HEMATOCRIT: 36.8 % (ref 36.0–46.0)
HEMOGLOBIN: 11.9 g/dL — AB (ref 12.0–15.0)
MCH: 28.1 pg (ref 26.0–34.0)
MCHC: 32.3 g/dL (ref 30.0–36.0)
MCV: 87 fL (ref 78.0–100.0)
Platelets: 145 10*3/uL — ABNORMAL LOW (ref 150–400)
RBC: 4.23 MIL/uL (ref 3.87–5.11)
RDW: 14.3 % (ref 11.5–15.5)
WBC: 5.8 10*3/uL (ref 4.0–10.5)

## 2016-06-30 MED ORDER — FAMOTIDINE IN NACL 20-0.9 MG/50ML-% IV SOLN
20.0000 mg | Freq: Two times a day (BID) | INTRAVENOUS | Status: DC
  Administered 2016-06-30 – 2016-07-01 (×4): 20 mg via INTRAVENOUS
  Filled 2016-06-30 (×7): qty 50

## 2016-06-30 MED ORDER — POLYETHYLENE GLYCOL 3350 17 G PO PACK
17.0000 g | PACK | Freq: Every day | ORAL | Status: DC
  Administered 2016-06-30 – 2016-07-02 (×3): 17 g via ORAL
  Filled 2016-06-30 (×3): qty 1

## 2016-06-30 MED ORDER — DOXYCYCLINE HYCLATE 100 MG IV SOLR
100.0000 mg | Freq: Two times a day (BID) | INTRAVENOUS | Status: DC
  Administered 2016-06-30 – 2016-07-01 (×4): 100 mg via INTRAVENOUS
  Filled 2016-06-30 (×6): qty 100

## 2016-06-30 MED ORDER — FUROSEMIDE 80 MG PO TABS
80.0000 mg | ORAL_TABLET | Freq: Two times a day (BID) | ORAL | Status: DC
  Administered 2016-06-30: 80 mg via ORAL

## 2016-06-30 MED ORDER — FUROSEMIDE 80 MG PO TABS
80.0000 mg | ORAL_TABLET | Freq: Two times a day (BID) | ORAL | Status: DC
  Administered 2016-06-30 – 2016-07-02 (×4): 80 mg via ORAL
  Filled 2016-06-30 (×5): qty 1

## 2016-06-30 NOTE — Consult Note (Signed)
[facility] CM Primary Care Navigator  06/30/2016  [name] 10/15/1960 [id number]   Met with patientat the bedside to identify possible discharge needs. Patient reports having persistent increased pain, warmth and swelling to left arm that had led to this admission. Patient endorses Dr. Cher Nakai with [facility] her primary care provider.   Patient shared using [facility] in [place] to obtain medications without difficulty.   Patient reports managing her own medications at home straight out of the containers.   She verbalized being able to drive prior to admission but daughter [name]) can provide transportation to her doctors' appointments when needed.  Patient reports that she is the caregiver for herself and has no family close by her, since they are living in [place]. [facility] list of personal care services was provided as herresource when needed, with the understanding that she will pay out of the pocket for it.  Anticipated discharge plan is home according to patient.  Patient expressed understanding to call primary care provider's officewhen she gets back homefor a post discharge follow-up appointment within a week or sooner if needed.Patient letter (with PCP's contact number) was provided as a reminder.  Discussed withpatientabout [facility] CM services available for healthmanagement but she denies any needs or concerns at this time.  Per MD note, patient's chronic diastolic systolic heart failure appears compensated and asthma appears stable at baseline. [facility] care management contact information provided for any future needs that may arise.   For questions, please contact:  Dannielle Huh, BSN, RN- [facility] Primary Care Navigator  Telephone: [phone] [place]

## 2016-06-30 NOTE — Progress Notes (Signed)
PROGRESS NOTE    [name]  ERD:[id number] DOB: 11/30/1960 DOA: 06/28/2016 PCP: Cher Nakai, MD   Outpatient Specialists:     Brief Narrative:  [name] is a 56 y.o. female who presents with cellulitis.  H/o lymphedema in left arm.  Started on clindamycin and developed hives.  Redness slow to improve.  Will check duplex of LUE to r/o DVT.   Assessment & Plan:   Principal Problem:   Cellulitis Active Problems:   Anemia   Elevated serum creatinine   Hypertension   Chronic combined systolic and diastolic CHF (congestive heart failure) (HCC)   Lymphedema of upper extremity following lymphadenectomy   Asthma   Elevated troponin   Cellulitis.  -lymphedema status post biopsy 2012. -check LUE duplex for DVT -change abx from clinda to doxy due to hives and PCN allergy- add benadryl/pepcid and continue claratin -elevate extremity -WBC count improved  Hypokalemia. Mild. Potassium 3.3. -Repleted   Hypertension. Controlled in the emergency department. -Continue beta blocker -continue spironolactone -continue lasix -patient follows with Dr. Einar Gip and it appears has been confused about how to take medications  Chronic diastolic systolic heart failure. Appears compensated -daily weights  -intake and output -continue home meds as noted above   Asthma. Appears stable at baseline. -Continue home meds   DVT prophylaxis:  Lovenox   Code Status: Full Code   Family Communication: Called MPOA as requested by patient-- Santiago Glad  Disposition Plan:     Consultants:      Subjective: c/o itching and hives- benadryl cream helps  Objective: Vitals:   06/29/16 0433 06/29/16 1449 06/29/16 2047 06/30/16 0503  BP: 109/60 (!) 104/57 102/81 119/63  Pulse: 69 78 79 81  Resp: 18 18 18 17   Temp: 98.7 F (37.1 C) 98.8 F (37.1 C) 98.2 F (36.8 C) 98.2 F (36.8 C)  TempSrc: Oral Oral Oral Oral  SpO2: 96% 96% 100% 97%  Weight:    92.8 kg (204 lb 9.6 oz)  Height:          Intake/Output Summary (Last 24 hours) at 06/30/16 1331 Last data filed at 06/30/16 0935  Gross per 24 hour  Intake              530 ml  Output              800 ml  Net             -270 ml   Filed Weights   06/28/16 1330 06/29/16 0241 06/30/16 0503  Weight: 86.2 kg (190 lb) 91.4 kg (201 lb 8 oz) 92.8 kg (204 lb 9.6 oz)    Examination:  General exam: uncomfortable appearing Respiratory system: diminished, good effort Cardiovascular system: S1 & S2 heard, RRR. No JVD, murmurs, rubs, gallops or clicks. No pedal edema. Gastrointestinal system: Abdomen is nondistended, soft and nontender. No organomegaly or masses felt. Normal bowel sounds heard. Central nervous system: Alert and oriented. No focal neurological deficits. Extremities: Symmetric 5 x 5 power. Skin: right arm swollen, warm-- hives across chest and upper back     Data Reviewed: I have personally reviewed following labs and imaging studies  CBC:  Recent Labs Lab 06/28/16 1350 06/29/16 0415 06/30/16 0648  WBC 11.6* 9.6 5.8  NEUTROABS 9.8*  --   --   HGB 12.1 11.1* 11.9*  HCT 36.7 34.5* 36.8  MCV 85.9 86.7 87.0  PLT 145* 141* 563*   Basic Metabolic Panel:  Recent Labs Lab 06/28/16 1350 06/28/16 1820 06/29/16 0415  06/30/16 0648  NA 136  --  138 137  K 3.3*  --  4.1 4.2  CL 103  --  110 105  CO2 23  --  22 24  GLUCOSE 108*  --  105* 105*  BUN 17  --  14 13  CREATININE 1.26*  --  1.18* 1.02*  CALCIUM 8.7*  --  8.6* 9.2  MG  --  1.8  --   --    GFR: Estimated Creatinine Clearance: 60.7 mL/min (A) (by C-G formula based on SCr of 1.02 mg/dL (H)). Liver Function Tests:  Recent Labs Lab 06/28/16 1350  AST 14*  ALT 18  ALKPHOS 58  BILITOT 0.6  PROT 6.1*  ALBUMIN 3.3*   No results for input(s): LIPASE, AMYLASE in the last 168 hours. No results for input(s): AMMONIA in the last 168 hours. Coagulation Profile: No results for input(s): INR, PROTIME in the last 168 hours. Cardiac Enzymes: No  results for input(s): CKTOTAL, CKMB, CKMBINDEX, TROPONINI in the last 168 hours. BNP (last 3 results) No results for input(s): PROBNP in the last 8760 hours. HbA1C: No results for input(s): HGBA1C in the last 72 hours. CBG: No results for input(s): GLUCAP in the last 168 hours. Lipid Profile: No results for input(s): CHOL, HDL, LDLCALC, TRIG, CHOLHDL, LDLDIRECT in the last 72 hours. Thyroid Function Tests: No results for input(s): TSH, T4TOTAL, FREET4, T3FREE, THYROIDAB in the last 72 hours. Anemia Panel: No results for input(s): VITAMINB12, FOLATE, FERRITIN, TIBC, IRON, RETICCTPCT in the last 72 hours. Urine analysis:    Component Value Date/Time   COLORURINE STRAW (A) 06/28/2016 1805   APPEARANCEUR CLEAR 06/28/2016 1805   LABSPEC 1.012 06/28/2016 1805   LABSPEC 1.005 09/05/2012 1229   PHURINE 6.0 06/28/2016 1805   GLUCOSEU NEGATIVE 06/28/2016 1805   GLUCOSEU Negative 09/05/2012 1229   HGBUR NEGATIVE 06/28/2016 1805   BILIRUBINUR NEGATIVE 06/28/2016 1805   BILIRUBINUR Negative 09/05/2012 1229   KETONESUR NEGATIVE 06/28/2016 1805   PROTEINUR NEGATIVE 06/28/2016 1805   UROBILINOGEN 0.2 12/30/2013 1951   UROBILINOGEN 0.2 09/05/2012 1229   NITRITE NEGATIVE 06/28/2016 1805   LEUKOCYTESUR NEGATIVE 06/28/2016 1805   LEUKOCYTESUR Negative 09/05/2012 1229      Recent Results (from the past 240 hour(s))  Blood Culture (routine x 2)     Status: None (Preliminary result)   Collection Time: 06/28/16  1:40 PM  Result Value Ref Range Status   Specimen Description BLOOD RIGHT ARM  Final   Special Requests   Final    BOTTLES DRAWN AEROBIC AND ANAEROBIC Blood Culture adequate volume   Culture NO GROWTH 2 DAYS  Final   Report Status PENDING  Incomplete  Blood Culture (routine x 2)     Status: None (Preliminary result)   Collection Time: 06/28/16  1:55 PM  Result Value Ref Range Status   Specimen Description BLOOD RIGHT HAND  Final   Special Requests   Final    BOTTLES DRAWN AEROBIC  AND ANAEROBIC Blood Culture adequate volume   Culture NO GROWTH 2 DAYS  Final   Report Status PENDING  Incomplete      Anti-infectives    Start     Dose/Rate Route Frequency Ordered Stop   06/30/16 1000  doxycycline (VIBRAMYCIN) 100 mg in dextrose 5 % 250 mL IVPB     100 mg 125 mL/hr over 120 Minutes Intravenous Every 12 hours 06/30/16 0925     06/29/16 1430  vancomycin (VANCOCIN) 1,500 mg in sodium chloride 0.9 % 500  mL IVPB  Status:  Discontinued     1,500 mg 250 mL/hr over 120 Minutes Intravenous Every 24 hours 06/28/16 1438 06/28/16 1514   06/29/16 1400  ceFEPIme (MAXIPIME) 2 g in dextrose 5 % 50 mL IVPB  Status:  Discontinued     2 g 100 mL/hr over 30 Minutes Intravenous Every 24 hours 06/28/16 1438 06/28/16 1514   06/28/16 1900  clindamycin (CLEOCIN) IVPB 600 mg  Status:  Discontinued     600 mg 100 mL/hr over 30 Minutes Intravenous Every 8 hours 06/28/16 1513 06/30/16 0917   06/28/16 1345  vancomycin (VANCOCIN) IVPB 1000 mg/200 mL premix  Status:  Discontinued     1,000 mg 200 mL/hr over 60 Minutes Intravenous  Once 06/28/16 1333 06/28/16 1336   06/28/16 1345  vancomycin (VANCOCIN) 1,500 mg in sodium chloride 0.9 % 500 mL IVPB  Status:  Discontinued     1,500 mg 250 mL/hr over 120 Minutes Intravenous  Once 06/28/16 1336 06/28/16 1702   06/28/16 1345  ceFEPIme (MAXIPIME) 2 g in dextrose 5 % 50 mL IVPB     2 g 100 mL/hr over 30 Minutes Intravenous  Once 06/28/16 1336 06/28/16 1434       Radiology Studies: Dg Chest Port 1 View  Result Date: 06/28/2016 CLINICAL DATA:  Swelling and redness in left arm since yesterday. Fever. EXAM: PORTABLE CHEST 1 VIEW COMPARISON:  01/02/2014 FINDINGS: Mild cardiomegaly. Lungs are clear. No effusions. No acute bony abnormality. IMPRESSION: Mild cardiomegaly.  No active disease. Electronically Signed   By: Rolm Baptise M.D.   On: 06/28/2016 13:52        Scheduled Meds: . anastrozole  1 mg Oral Daily  . doxycycline (VIBRAMYCIN) IV  100 mg  Intravenous Q12H  . enoxaparin (LOVENOX) injection  40 mg Subcutaneous Q24H  . famotidine (PEPCID) IV  20 mg Intravenous Q12H  . fluticasone  1 spray Each Nare Daily  . furosemide  80 mg Oral BID  . levothyroxine  88 mcg Oral QAC breakfast  . loratadine  10 mg Oral Daily  . metoprolol tartrate  25 mg Oral BID  . mometasone-formoterol  2 puff Inhalation BID  . montelukast  10 mg Oral Daily  . nicotine  7 mg Transdermal Daily  . pantoprazole  40 mg Oral Daily  . polyethylene glycol  17 g Oral Daily  . potassium chloride  40 mEq Oral BID  . saccharomyces boulardii  250 mg Oral BID  . sertraline  100 mg Oral Daily  . spironolactone  50 mg Oral Daily  . zolpidem  5 mg Oral QHS   Continuous Infusions:   LOS: 1 day    Time spent: 25 min    [place], DO [facility] Pager [phone]  If 7PM-7AM, please contact night-coverage [email or web address] Password [email or web address] 06/30/2016, 1:31 PM

## 2016-07-01 ENCOUNTER — Inpatient Hospital Stay (HOSPITAL_COMMUNITY): Payer: Medicare Other

## 2016-07-01 DIAGNOSIS — R748 Abnormal levels of other serum enzymes: Secondary | ICD-10-CM

## 2016-07-01 DIAGNOSIS — M7989 Other specified soft tissue disorders: Secondary | ICD-10-CM

## 2016-07-01 DIAGNOSIS — G43909 Migraine, unspecified, not intractable, without status migrainosus: Secondary | ICD-10-CM | POA: Diagnosis present

## 2016-07-01 MED ORDER — KETOROLAC TROMETHAMINE 30 MG/ML IJ SOLN
30.0000 mg | Freq: Once | INTRAMUSCULAR | Status: AC
  Administered 2016-07-01: 30 mg via INTRAVENOUS
  Filled 2016-07-01: qty 1

## 2016-07-01 MED ORDER — DIPHENHYDRAMINE HCL 50 MG/ML IJ SOLN
25.0000 mg | Freq: Once | INTRAMUSCULAR | Status: AC
  Administered 2016-07-01: 25 mg via INTRAVENOUS
  Filled 2016-07-01: qty 1

## 2016-07-01 MED ORDER — METOCLOPRAMIDE HCL 5 MG/ML IJ SOLN
5.0000 mg | Freq: Once | INTRAMUSCULAR | Status: AC
  Administered 2016-07-01: 5 mg via INTRAVENOUS
  Filled 2016-07-01: qty 2

## 2016-07-01 NOTE — Progress Notes (Signed)
[facility]                                                                              Patient Demographics  [name], is a 56 y.o. female, DOB - [birth year], IOX:[id number]  Admit date - 06/28/2016   Admitting Physician Waldemar Dickens, MD  Outpatient Primary MD for the patient is [facility], MD  Outpatient specialists:   LOS - 2  days    Chief Complaint  Patient presents with  . Fever  . Arm Pain       Brief summary   Patient is a 56 year old female with CHF, chronic left arm lymphedema, obesity, hypertension, breast CA status post chemotherapy, hypothyroidism, COPD presented with left arm pain and redness concerning for cellulitis. Patient also reported fever of 101.73F on admission   Assessment & Plan    Principal Problem: Cellulitis Left upper extremity.  -lymphedema status post biopsy 2012. -Left upper extremity duplex negative for DVT -Continue IV doxycycline, elevate left arm  -WBC count improved  Active problems  Hypertension.  -Continue metoprolol, spironolactone, Lasix  -patient follows with Dr. Einar Gip   Chronic systolic and diastolic systolic heart failure. Appears compensated -daily weights  -intake and output -Last echo in system 06/2013 showed EF of 30-35% with grade 1 diastolic dysfunction - Continue metoprolol, Lasix, Aldactone, unclear why patient is not on ACE inhibitor   Asthma. Appears stable at baseline. -Currently stable, continue albuterol nebs as needed   Migraine headache - Complaining of migraine headache since Sunday, place on toradol, reglan, benadryl x1   Code Status: full code   DVT Prophylaxis:  Lovenox  Family Communication: Discussed in detail with the patient, all imaging results, lab results explained to the patient    Disposition Plan:   Time Spent in minutes 25 minutes  Procedures:  Doppler ultrasound of the left upper extremityPreliminary findings: No evidence of DVT or superficial  thrombosis.   Consultants:   None  Antimicrobials:   IV doxycycline 4/11   Medications  Scheduled Meds: . anastrozole  1 mg Oral Daily  . doxycycline (VIBRAMYCIN) IV  100 mg Intravenous Q12H  . enoxaparin (LOVENOX) injection  40 mg Subcutaneous Q24H  . famotidine (PEPCID) IV  20 mg Intravenous Q12H  . fluticasone  1 spray Each Nare Daily  . furosemide  80 mg Oral BID  . levothyroxine  88 mcg Oral QAC breakfast  . loratadine  10 mg Oral Daily  . metoprolol tartrate  25 mg Oral BID  . mometasone-formoterol  2 puff Inhalation BID  . montelukast  10 mg Oral Daily  . nicotine  7 mg Transdermal Daily  . pantoprazole  40 mg Oral Daily  . polyethylene glycol  17 g Oral Daily  . potassium chloride  40 mEq Oral BID  . saccharomyces boulardii  250 mg Oral BID  . sertraline  100 mg Oral Daily  . spironolactone  50 mg Oral Daily  . zolpidem  5 mg Oral QHS   Continuous Infusions: PRN Meds:.acetaminophen **OR** acetaminophen, albuterol, diphenhydrAMINE, hydrocortisone cream, ondansetron **OR** ondansetron (ZOFRAN) IV   Antibiotics   Anti-infectives    Start  Dose/Rate Route Frequency Ordered Stop   06/30/16 1000  doxycycline (VIBRAMYCIN) 100 mg in dextrose 5 % 250 mL IVPB     100 mg 125 mL/hr over 120 Minutes Intravenous Every 12 hours 06/30/16 0925     06/29/16 1430  vancomycin (VANCOCIN) 1,500 mg in sodium chloride 0.9 % 500 mL IVPB  Status:  Discontinued     1,500 mg 250 mL/hr over 120 Minutes Intravenous Every 24 hours 06/28/16 1438 06/28/16 1514   06/29/16 1400  ceFEPIme (MAXIPIME) 2 g in dextrose 5 % 50 mL IVPB  Status:  Discontinued     2 g 100 mL/hr over 30 Minutes Intravenous Every 24 hours 06/28/16 1438 06/28/16 1514   06/28/16 1900  clindamycin (CLEOCIN) IVPB 600 mg  Status:  Discontinued     600 mg 100 mL/hr over 30 Minutes Intravenous Every 8 hours 06/28/16 1513 06/30/16 0917   06/28/16 1345  vancomycin (VANCOCIN) IVPB 1000 mg/200 mL premix  Status:   Discontinued     1,000 mg 200 mL/hr over 60 Minutes Intravenous  Once 06/28/16 1333 06/28/16 1336   06/28/16 1345  vancomycin (VANCOCIN) 1,500 mg in sodium chloride 0.9 % 500 mL IVPB  Status:  Discontinued     1,500 mg 250 mL/hr over 120 Minutes Intravenous  Once 06/28/16 1336 06/28/16 1702   06/28/16 1345  ceFEPIme (MAXIPIME) 2 g in dextrose 5 % 50 mL IVPB     2 g 100 mL/hr over 30 Minutes Intravenous  Once 06/28/16 1336 06/28/16 1434        Subjective:   [name] was seen and examined today.  Feels her left upper arm redness is improving, swelling and pain is improving. Patient denies dizziness, chest pain, shortness of breath, abdominal pain, N/V/D/C, new weakness, numbess, tingling. No acute events overnight.    Objective:   Vitals:   06/30/16 1336 06/30/16 2030 07/01/16 0440 07/01/16 0957  BP: 124/83 127/77 (!) 133/94 106/71  Pulse: 71 73 69 77  Resp: 20 20 18 16   Temp: 98.5 F (36.9 C) 98.3 F (36.8 C) 98.3 F (36.8 C) 97.7 F (36.5 C)  TempSrc: Oral Oral Oral Oral  SpO2: 97% 100% 98% 97%  Weight:      Height:        Intake/Output Summary (Last 24 hours) at 07/01/16 1428 Last data filed at 07/01/16 1020  Gross per 24 hour  Intake              660 ml  Output             2800 ml  Net            -2140 ml     Wt Readings from Last 3 Encounters:  06/30/16 92.8 kg (204 lb 9.6 oz)  05/04/16 86.6 kg (190 lb 14.4 oz)  06/24/15 89.3 kg (196 lb 12.8 oz)     Exam  General: Alert and oriented x 3, NAD  HEENT:    Neck: Supple, no JVD, no masses  Cardiovascular: S1 S2 auscultated, no rubs, murmurs or gallops. Regular rate and rhythm.  Respiratory: Clear to auscultation bilaterally, no wheezing, rales or rhonchi  Gastrointestinal: Soft, nontender, nondistended, + bowel sounds  Ext: no cyanosis clubbing, Right arm lymphedema   Neuro: AAOx3, Cr N's II- XII. Strength 5/5 upper and lower extremities bilaterally  Skin: Right arm erythema, swelling is  improving  Psych: Normal affect and demeanor, alert and oriented x3    Data Reviewed:  I have personally  reviewed following labs and imaging studies  Micro Results Recent Results (from the past 240 hour(s))  Blood Culture (routine x 2)     Status: None (Preliminary result)   Collection Time: 06/28/16  1:40 PM  Result Value Ref Range Status   Specimen Description BLOOD RIGHT ARM  Final   Special Requests   Final    BOTTLES DRAWN AEROBIC AND ANAEROBIC Blood Culture adequate volume   Culture NO GROWTH 3 DAYS  Final   Report Status PENDING  Incomplete  Blood Culture (routine x 2)     Status: None (Preliminary result)   Collection Time: 06/28/16  1:55 PM  Result Value Ref Range Status   Specimen Description BLOOD RIGHT HAND  Final   Special Requests   Final    BOTTLES DRAWN AEROBIC AND ANAEROBIC Blood Culture adequate volume   Culture NO GROWTH 3 DAYS  Final   Report Status PENDING  Incomplete    Radiology Reports Dg Chest Port 1 View  Result Date: 06/28/2016 CLINICAL DATA:  Swelling and redness in left arm since yesterday. Fever. EXAM: PORTABLE CHEST 1 VIEW COMPARISON:  01/02/2014 FINDINGS: Mild cardiomegaly. Lungs are clear. No effusions. No acute bony abnormality. IMPRESSION: Mild cardiomegaly.  No active disease. Electronically Signed   By: Rolm Baptise M.D.   On: 06/28/2016 13:52    Lab Data:  CBC:  Recent Labs Lab 06/28/16 1350 06/29/16 0415 06/30/16 0648  WBC 11.6* 9.6 5.8  NEUTROABS 9.8*  --   --   HGB 12.1 11.1* 11.9*  HCT 36.7 34.5* 36.8  MCV 85.9 86.7 87.0  PLT 145* 141* 161*   Basic Metabolic Panel:  Recent Labs Lab 06/28/16 1350 06/28/16 1820 06/29/16 0415 06/30/16 0648  NA 136  --  138 137  K 3.3*  --  4.1 4.2  CL 103  --  110 105  CO2 23  --  22 24  GLUCOSE 108*  --  105* 105*  BUN 17  --  14 13  CREATININE 1.26*  --  1.18* 1.02*  CALCIUM 8.7*  --  8.6* 9.2  MG  --  1.8  --   --    GFR: Estimated Creatinine Clearance: 60.7 mL/min (A) (by  C-G formula based on SCr of 1.02 mg/dL (H)). Liver Function Tests:  Recent Labs Lab 06/28/16 1350  AST 14*  ALT 18  ALKPHOS 58  BILITOT 0.6  PROT 6.1*  ALBUMIN 3.3*   No results for input(s): LIPASE, AMYLASE in the last 168 hours. No results for input(s): AMMONIA in the last 168 hours. Coagulation Profile: No results for input(s): INR, PROTIME in the last 168 hours. Cardiac Enzymes: No results for input(s): CKTOTAL, CKMB, CKMBINDEX, TROPONINI in the last 168 hours. BNP (last 3 results) No results for input(s): PROBNP in the last 8760 hours. HbA1C: No results for input(s): HGBA1C in the last 72 hours. CBG: No results for input(s): GLUCAP in the last 168 hours. Lipid Profile: No results for input(s): CHOL, HDL, LDLCALC, TRIG, CHOLHDL, LDLDIRECT in the last 72 hours. Thyroid Function Tests: No results for input(s): TSH, T4TOTAL, FREET4, T3FREE, THYROIDAB in the last 72 hours. Anemia Panel: No results for input(s): VITAMINB12, FOLATE, FERRITIN, TIBC, IRON, RETICCTPCT in the last 72 hours. Urine analysis:    Component Value Date/Time   COLORURINE STRAW (A) 06/28/2016 1805   APPEARANCEUR CLEAR 06/28/2016 1805   LABSPEC 1.012 06/28/2016 1805   LABSPEC 1.005 09/05/2012 1229   PHURINE 6.0 06/28/2016 1805   GLUCOSEU NEGATIVE 06/28/2016  [place] Negative 09/05/2012 [place] 06/28/2016 [place] 06/28/2016 1805   BILIRUBINUR Negative 09/05/2012 [place] 06/28/2016 1805   PROTEINUR NEGATIVE 06/28/2016 1805   UROBILINOGEN 0.2 12/30/2013 1951   UROBILINOGEN 0.2 09/05/2012 1229   NITRITE NEGATIVE 06/28/2016 1805   LEUKOCYTESUR NEGATIVE 06/28/2016 1805   LEUKOCYTESUR Negative 09/05/2012 1229     Ripudeep Rai M.D. [facility] 07/01/2016, 2:28 PM  Pager: [phone] Between 7am to 7pm - call Pager - [phone]  After 7pm go to [email or web address] - password [email or web address]  Call night coverage person covering after 7pm

## 2016-07-01 NOTE — Progress Notes (Signed)
Patient left floor without an order. Dr. Tana Coast text paged.

## 2016-07-01 NOTE — Progress Notes (Addendum)
*  PRELIMINARY RESULTS* Vascular Ultrasound Left upper extremity venous duplex has been completed.  Preliminary findings: No evidence of DVT or superficial thrombosis.   Landry Mellow, RDMS, RVT  07/01/2016, 1:40 PM

## 2016-07-02 DIAGNOSIS — L03114 Cellulitis of left upper limb: Principal | ICD-10-CM

## 2016-07-02 MED ORDER — DOXYCYCLINE HYCLATE 100 MG PO TABS: 100.0000 mg | ORAL_TABLET | Freq: Two times a day (BID) | ORAL | Status: DC

## 2016-07-02 MED ORDER — SACCHAROMYCES BOULARDII 250 MG PO CAPS: 250.0000 mg | ORAL_CAPSULE | Freq: Two times a day (BID) | ORAL | 0 refills | Status: DC

## 2016-07-02 MED ORDER — POTASSIUM CHLORIDE ER 20 MEQ PO TBCR: 20.0000 meq | EXTENDED_RELEASE_TABLET | Freq: Every day | ORAL | 3 refills | Status: DC

## 2016-07-02 MED ORDER — DOXYCYCLINE HYCLATE 100 MG PO TABS: 100.0000 mg | ORAL_TABLET | Freq: Two times a day (BID) | ORAL | 0 refills | Status: DC

## 2016-07-02 MED ORDER — BUTALBITAL-APAP-CAFFEINE 50-325-40 MG PO TABS: 1.0000 | ORAL_TABLET | Freq: Four times a day (QID) | ORAL | 0 refills | Status: DC | PRN

## 2016-07-02 MED ORDER — HYDROCORTISONE 1 % EX CREA: TOPICAL_CREAM | CUTANEOUS | 0 refills | Status: DC | PRN

## 2016-07-02 MED ORDER — POTASSIUM CHLORIDE ER 20 MEQ PO TBCR: 20.0000 meq | EXTENDED_RELEASE_TABLET | Freq: Every day | ORAL | 2 refills | Status: DC

## 2016-07-02 MED ORDER — TRAMADOL HCL 50 MG PO TABS
50.0000 mg | ORAL_TABLET | Freq: Once | ORAL | Status: AC
  Administered 2016-07-02: 50 mg via ORAL
  Filled 2016-07-02: qty 1

## 2016-07-02 MED ORDER — DOXYCYCLINE HYCLATE 100 MG PO TABS
100.0000 mg | ORAL_TABLET | Freq: Two times a day (BID) | ORAL | Status: DC
  Administered 2016-07-02: 100 mg via ORAL
  Filled 2016-07-02: qty 1

## 2016-07-02 NOTE — Care Management Note (Signed)
Case Management Note  Patient Details  Name: CHACE BISCH MRN: [id number] Date of Birth: [birth year]  Subjective/Objective:                    Action/Plan:  No needs identified  Expected Discharge Date:  07/02/16               Expected Discharge Plan:  Home/Self Care  In-House Referral:     Discharge planning Services     Post Acute Care Choice:    Choice offered to:     DME Arranged:    DME Agency:     HH Arranged:    [place] Agency:     Status of Service:  Completed, signed off  If discussed at [employer] of Stay Meetings, dates discussed:    Additional Comments:  Marilu Favre, RN 07/02/2016, 10:02 AM

## 2016-07-02 NOTE — Care Management Important Message (Signed)
Important Message  Patient Details  Name: [name] MRN: [id number] Date of Birth: May 11, 1960   Medicare Important Message Given:  Yes    [name] 07/02/2016, 2:52 PM

## 2016-07-02 NOTE — Progress Notes (Signed)
Late entry - patient was discharged to home. Discharge instructions were reviewed with patient and patient verbalized understanding. Patient was also given a rx's (4). Patient left the unit via wheelchair.   Vergia Alberts n 07/02/16 1:44 PM

## 2016-07-02 NOTE — Discharge Summary (Signed)
Physician Discharge Summary   Patient ID: [name] MRN: [id number] DOB/AGE: June 05, 1960 56 y.o.  Admit date: 06/28/2016 Discharge date: 07/02/2016  Primary Care Physician:  Cher Nakai, MD  Discharge Diagnoses:    . Left arm Cellulitis . Asthma . Anemia . Chronic combined systolic and diastolic CHF (congestive heart failure) ([place]) . Elevated serum creatinine . Hypertension . Migraine Hypokalemia   Consults: None  Recommendations for Outpatient Follow-up:  1. Please repeat CBC/BMET at next visit   DIET: Heart healthy diet    Allergies:   Allergies  Allergen Reactions  . Aspirin Hives  . Docetaxel Other (See Comments)    Chest tightness and pain  . Morphine And Related Hives and Swelling  . Penicillins Hives    Has patient had a PCN reaction causing immediate rash, facial/tongue/throat swelling, SOB or lightheadedness with hypotension: Yes Has patient had a PCN reaction causing severe rash involving mucus membranes or skin necrosis: No Has patient had a PCN reaction that required hospitalization No Has patient had a PCN reaction occurring within the last 10 years: Yes If all of the above answers are "NO", then may proceed with Cephalosporin use.  . Clindamycin/Lincomycin Hives  . Dairy Aid [place]  . Lipitor [Atorvastatin] Hives  . Other Hives    Reaction to cats and dogs  . Peanuts [Peanut Oil] Hives     DISCHARGE MEDICATIONS: Discharge Medication List as of 07/02/2016 11:13 AM    START taking these medications   Details  butalbital-acetaminophen-caffeine (FIORICET, ESGIC) 50-325-40 MG tablet Take 1-2 tablets by mouth every 6 (six) hours as needed for headache or migraine., Starting Fri 07/02/2016, Until Sat 07/02/2017, Print    doxycycline (VIBRA-TABS) 100 MG tablet Take 1 tablet (100 mg total) by mouth 2 (two) times daily. X 7 days, Starting Fri 07/02/2016, Print    hydrocortisone cream 1 % Apply topically as needed for itching. Apply to affected area  for itching, Starting Fri 07/02/2016, Print    saccharomyces boulardii (FLORASTOR) 250 MG capsule Take 1 capsule (250 mg total) by mouth 2 (two) times daily., Starting Fri 07/02/2016, Print      CONTINUE these medications which have NOT CHANGED   Details  albuterol (PROVENTIL HFA;VENTOLIN HFA) 108 (90 BASE) MCG/ACT inhaler Inhale 2 puffs into the lungs every 6 (six) hours as needed for wheezing or shortness of breath., Historical Med    anastrozole (ARIMIDEX) 1 MG tablet Take 1 tablet (1 mg total) by mouth daily., Starting Wed 05/05/2016, Normal    budesonide (RHINOCORT ALLERGY) 32 MCG/ACT nasal spray Place 1 spray into both nostrils daily as needed for rhinitis or allergies. , Historical Med    budesonide-formoterol (SYMBICORT) 160-4.5 MCG/ACT inhaler Inhale 2 puffs into the lungs 2 (two) times daily., Starting Wed 12/24/2015, Normal    Cetirizine HCl (ZYRTEC ALLERGY) 10 MG CAPS Take 1 capsule (10 mg total) by mouth at bedtime as needed (congestion)., Starting Wed 01/02/2014, Print    diphenhydrAMINE (BENADRYL) 25 MG tablet Take 37.5 mg by mouth daily as needed for itching., Historical Med    diphenhydramine-acetaminophen (TYLENOL PM) 25-500 MG TABS Take 3 tablets by mouth at bedtime. , Historical Med    furosemide (LASIX) 40 MG tablet Take 80 mg by mouth See admin instructions. Take 2 tablets (40 mg) by mouth every morning and afternoon, may also take an extra tablet (20 mg) if needed for fluid build-up, Starting Sat 05/08/2016, Historical Med    levothyroxine (SYNTHROID, LEVOTHROID) 88 MCG tablet Take 88 mcg by mouth  daily before breakfast., Historical Med    Melatonin 10 MG TABS Take 10 mg by mouth at bedtime as needed (restless legs). , Historical Med    montelukast (SINGULAIR) 10 MG tablet Take 1 tablet (10 mg total) by mouth daily. as directed, Starting Wed 12/24/2015, Normal    nitroGLYCERIN (NITROSTAT) 0.4 MG SL tablet Place 0.4 mg under the tongue every 5 (five) minutes as needed for  chest pain. , Historical Med    omeprazole (PRILOSEC) 20 MG capsule Take 20 mg by mouth 2 (two) times daily. , Historical Med    oxyCODONE-acetaminophen (PERCOCET) 7.5-325 MG tablet Take 1 tablet by mouth., Starting Wed 06/23/2016, Historical Med    polyethylene glycol (MIRALAX / GLYCOLAX) packet Take 17 g by mouth daily. Mix in 8 oz liquid and drink, Historical Med    sertraline (ZOLOFT) 100 MG tablet Take 100 mg by mouth 2 (two) times daily. , Starting Wed 12/17/2015, Historical Med    zolpidem (AMBIEN) 10 MG tablet Take 10 mg by mouth at bedtime. , Historical Med    metoprolol (TOPROL-XL) 200 MG 24 hr tablet Take 200 mg by mouth daily., Starting Mon 05/24/2016, Historical Med    spironolactone (ALDACTONE) 50 MG tablet Take 50 mg by mouth daily., Starting Mon 06/21/2016, Historical Med    Potassium K Dur 20 mEq                    Take 20 mEq by mouth daily starting 07/03/16           Brief H and P: For complete details please refer to admission H and P, but in brief Patient is a 56 year old female with CHF, chronic left arm lymphedema, obesity, hypertension, breast CA status post chemotherapy, hypothyroidism, COPD presented with left arm pain and redness concerning for cellulitis. Patient also reported fever of 101.39F on admission  Hospital Course:  Cellulitis Left upper extremity.  -lymphedema status post biopsy 2012. -Left upper extremity duplex was negative for DVT -Patient was placed on IV doxycycline and recommended to elevate left arm.   -WBC count has improved, patient was transitioned to oral doxycycline    Hypertension.  -Continue metoprolol, spironolactone, Lasix  -patient follows with Dr. Einar Gip   Chronic systolic and diastolic systolic heart failure. Appears compensated -Last echo in system 06/2013 showed EF of 30-35% with grade 1 diastolic dysfunction - Continue metoprolol, Lasix, Aldactone, unclear why patient is not on ACE inhibitor. Recommended to follow up with  her outpatient cardiologist, Dr. Einar Gip  Asthma. Appears stable at baseline. -Currently stable, continue albuterol nebs as needed   Migraine headache - Complaining of migraine headache since Sunday, placed on toradol, reglan, benadryl x1 which improved her migraine headache. Placed on fioricet as needed. Over-the-counter and recommended to follow up outpatient with neurology if needed  Hypokalemia K 2.9 on admission, patient was not taking potassium at the time of admission despite being on Lasix. She was discharged on potassium and recommended to follow-up with Dr. Einar Gip   Day of Discharge BP 106/74 (BP Location: Right Arm)   Pulse 68   Temp 97.8 F (36.6 C) (Oral)   Resp 17   Ht 4' 9.5" (1.461 m)   Wt 92.8 kg (204 lb 9.6 oz)   SpO2 99%   BMI 43.51 kg/m   Physical Exam: General: Alert and awake oriented x3 not in any acute distress. HEENT: anicteric sclera, pupils reactive to light and accommodation CVS: S1-S2 clear no murmur rubs or gallops Chest: clear to  auscultation bilaterally, no wheezing rales or rhonchi Abdomen: soft nontender, nondistended, normal bowel sounds Extremities: no cyanosis, clubbing or edema noted bilaterally, left arm cellulitis improving Neuro: Cranial nerves II-XII intact, no focal neurological deficits   The results of significant diagnostics from this hospitalization (including imaging, microbiology, ancillary and laboratory) are listed below for reference.    LAB RESULTS: Basic Metabolic Panel:  Recent Labs Lab 06/28/16 1820 06/29/16 0415 06/30/16 0648  NA  --  138 137  K  --  4.1 4.2  CL  --  110 105  CO2  --  22 24  GLUCOSE  --  105* 105*  BUN  --  14 13  CREATININE  --  1.18* 1.02*  CALCIUM  --  8.6* 9.2  MG 1.8  --   --    Liver Function Tests:  Recent Labs Lab 06/28/16 1350  AST 14*  ALT 18  ALKPHOS 58  BILITOT 0.6  PROT 6.1*  ALBUMIN 3.3*   No results for input(s): LIPASE, AMYLASE in the last 168 hours. No results  for input(s): AMMONIA in the last 168 hours. CBC:  Recent Labs Lab 06/28/16 1350 06/29/16 0415 06/30/16 0648  WBC 11.6* 9.6 5.8  NEUTROABS 9.8*  --   --   HGB 12.1 11.1* 11.9*  HCT 36.7 34.5* 36.8  MCV 85.9 86.7 87.0  PLT 145* 141* 145*   Cardiac Enzymes: No results for input(s): CKTOTAL, CKMB, CKMBINDEX, TROPONINI in the last 168 hours. BNP: Invalid input(s): POCBNP CBG: No results for input(s): GLUCAP in the last 168 hours.  Significant Diagnostic Studies:  Dg Chest Port 1 View  Result Date: 06/28/2016 CLINICAL DATA:  Swelling and redness in left arm since yesterday. Fever. EXAM: PORTABLE CHEST 1 VIEW COMPARISON:  01/02/2014 FINDINGS: Mild cardiomegaly. Lungs are clear. No effusions. No acute bony abnormality. IMPRESSION: Mild cardiomegaly.  No active disease. Electronically Signed   By: Rolm Baptise M.D.   On: 06/28/2016 13:52    2D ECHO:   Disposition and Follow-up: Discharge Instructions    Diet - low sodium heart healthy    Complete by:  As directed    Increase activity slowly    Complete by:  As directed        DISPOSITION: Home DISCHARGE FOLLOW-UP Follow-up Information    Fitz,KEUNG, MD. Schedule an appointment as soon as possible for a visit in 2 week(s).   Specialty:  Internal Medicine Contact information: [address] [address] [phone]        Adrian Prows, MD. Schedule an appointment as soon as possible for a visit in 10 day(s).   Specialty:  Cardiology Contact information: [address] [place] [address] [phone]            Time spent on Discharge: 29 minutes  Signed:   Estill Cotta M.D. [facility] 07/02/2016, 12:40 PM Pager: [phone]

## 2016-07-03 LAB — CULTURE, BLOOD (ROUTINE X 2)
CULTURE: NO GROWTH
CULTURE: NO GROWTH
SPECIAL REQUESTS: ADEQUATE
Special Requests: ADEQUATE

## 2016-07-15 ENCOUNTER — Other Ambulatory Visit: Payer: Self-pay | Admitting: Allergy and Immunology

## 2016-07-21 DIAGNOSIS — I890 Lymphedema, not elsewhere classified: Secondary | ICD-10-CM | POA: Diagnosis not present

## 2016-07-21 DIAGNOSIS — K219 Gastro-esophageal reflux disease without esophagitis: Secondary | ICD-10-CM | POA: Diagnosis not present

## 2016-07-21 DIAGNOSIS — I10 Essential (primary) hypertension: Secondary | ICD-10-CM | POA: Diagnosis not present

## 2016-07-21 DIAGNOSIS — I509 Heart failure, unspecified: Secondary | ICD-10-CM | POA: Diagnosis not present

## 2016-07-21 DIAGNOSIS — C50919 Malignant neoplasm of unspecified site of unspecified female breast: Secondary | ICD-10-CM | POA: Diagnosis not present

## 2016-07-21 DIAGNOSIS — M545 Low back pain: Secondary | ICD-10-CM | POA: Diagnosis not present

## 2016-07-21 DIAGNOSIS — J0101 Acute recurrent maxillary sinusitis: Secondary | ICD-10-CM | POA: Diagnosis not present

## 2016-07-21 DIAGNOSIS — E7800 Pure hypercholesterolemia, unspecified: Secondary | ICD-10-CM | POA: Diagnosis not present

## 2016-07-21 DIAGNOSIS — D464 Refractory anemia, unspecified: Secondary | ICD-10-CM | POA: Diagnosis not present

## 2016-07-21 DIAGNOSIS — J309 Allergic rhinitis, unspecified: Secondary | ICD-10-CM | POA: Diagnosis not present

## 2016-07-21 DIAGNOSIS — Z888 Allergy status to other drugs, medicaments and biological substances status: Secondary | ICD-10-CM | POA: Diagnosis not present

## 2016-07-21 DIAGNOSIS — F341 Dysthymic disorder: Secondary | ICD-10-CM | POA: Diagnosis not present

## 2016-08-19 DIAGNOSIS — E039 Hypothyroidism, unspecified: Secondary | ICD-10-CM | POA: Diagnosis not present

## 2016-08-19 DIAGNOSIS — M545 Low back pain: Secondary | ICD-10-CM | POA: Diagnosis not present

## 2016-08-19 DIAGNOSIS — J309 Allergic rhinitis, unspecified: Secondary | ICD-10-CM | POA: Diagnosis not present

## 2016-08-19 DIAGNOSIS — I509 Heart failure, unspecified: Secondary | ICD-10-CM | POA: Diagnosis not present

## 2016-08-19 DIAGNOSIS — I10 Essential (primary) hypertension: Secondary | ICD-10-CM | POA: Diagnosis not present

## 2016-08-19 DIAGNOSIS — F341 Dysthymic disorder: Secondary | ICD-10-CM | POA: Diagnosis not present

## 2016-08-19 DIAGNOSIS — E7800 Pure hypercholesterolemia, unspecified: Secondary | ICD-10-CM | POA: Diagnosis not present

## 2016-08-19 DIAGNOSIS — F17200 Nicotine dependence, unspecified, uncomplicated: Secondary | ICD-10-CM | POA: Diagnosis not present

## 2016-08-19 DIAGNOSIS — D464 Refractory anemia, unspecified: Secondary | ICD-10-CM | POA: Diagnosis not present

## 2016-08-19 DIAGNOSIS — K219 Gastro-esophageal reflux disease without esophagitis: Secondary | ICD-10-CM | POA: Diagnosis not present

## 2016-08-19 DIAGNOSIS — Z888 Allergy status to other drugs, medicaments and biological substances status: Secondary | ICD-10-CM | POA: Diagnosis not present

## 2016-09-12 DIAGNOSIS — Z1211 Encounter for screening for malignant neoplasm of colon: Secondary | ICD-10-CM | POA: Diagnosis not present

## 2016-09-12 DIAGNOSIS — Z1212 Encounter for screening for malignant neoplasm of rectum: Secondary | ICD-10-CM | POA: Diagnosis not present

## 2016-10-13 DIAGNOSIS — Z888 Allergy status to other drugs, medicaments and biological substances status: Secondary | ICD-10-CM | POA: Diagnosis not present

## 2016-10-13 DIAGNOSIS — F17200 Nicotine dependence, unspecified, uncomplicated: Secondary | ICD-10-CM | POA: Diagnosis not present

## 2016-10-13 DIAGNOSIS — I10 Essential (primary) hypertension: Secondary | ICD-10-CM | POA: Diagnosis not present

## 2016-10-13 DIAGNOSIS — I890 Lymphedema, not elsewhere classified: Secondary | ICD-10-CM | POA: Diagnosis not present

## 2016-10-13 DIAGNOSIS — R195 Other fecal abnormalities: Secondary | ICD-10-CM | POA: Diagnosis not present

## 2016-10-13 DIAGNOSIS — C50919 Malignant neoplasm of unspecified site of unspecified female breast: Secondary | ICD-10-CM | POA: Diagnosis not present

## 2016-10-13 DIAGNOSIS — K219 Gastro-esophageal reflux disease without esophagitis: Secondary | ICD-10-CM | POA: Diagnosis not present

## 2016-10-13 DIAGNOSIS — I509 Heart failure, unspecified: Secondary | ICD-10-CM | POA: Diagnosis not present

## 2016-10-13 DIAGNOSIS — J309 Allergic rhinitis, unspecified: Secondary | ICD-10-CM | POA: Diagnosis not present

## 2016-10-13 DIAGNOSIS — E7800 Pure hypercholesterolemia, unspecified: Secondary | ICD-10-CM | POA: Diagnosis not present

## 2016-10-22 DIAGNOSIS — I5032 Chronic diastolic (congestive) heart failure: Secondary | ICD-10-CM | POA: Diagnosis not present

## 2016-10-22 DIAGNOSIS — I129 Hypertensive chronic kidney disease with stage 1 through stage 4 chronic kidney disease, or unspecified chronic kidney disease: Secondary | ICD-10-CM | POA: Diagnosis not present

## 2016-10-22 DIAGNOSIS — N183 Chronic kidney disease, stage 3 (moderate): Secondary | ICD-10-CM | POA: Diagnosis not present

## 2016-10-22 DIAGNOSIS — I10 Essential (primary) hypertension: Secondary | ICD-10-CM | POA: Diagnosis not present

## 2016-10-27 DIAGNOSIS — R195 Other fecal abnormalities: Secondary | ICD-10-CM | POA: Diagnosis not present

## 2016-10-27 DIAGNOSIS — Z1211 Encounter for screening for malignant neoplasm of colon: Secondary | ICD-10-CM | POA: Diagnosis not present

## 2016-12-17 ENCOUNTER — Other Ambulatory Visit: Payer: Self-pay | Admitting: *Deleted

## 2016-12-17 ENCOUNTER — Telehealth: Payer: Self-pay | Admitting: *Deleted

## 2016-12-17 MED ORDER — ANASTROZOLE 1 MG PO TABS: 1.0000 mg | ORAL_TABLET | Freq: Every day | ORAL | 1 refills | Status: DC

## 2016-12-17 NOTE — Telephone Encounter (Signed)
"  I would love Dr. Virgie Dad nurse to call me [phone]) to let me know if I have any refills on the cancer medicine I taken for the last five years."  [facility] has refilled anastrozole started early November 2013 after radiation in addition to sending scheduling message for F/U before graduation to Surviv[employer].

## 2016-12-17 NOTE — Telephone Encounter (Signed)
Pt called to inquire if she needed to continue on the anastrazole ?  Per chart review noted pt started medication November 2013 - she was last seen in Feb 2018 with plan to follow up with MD in 6 months- not noted appointment scheduled.  This RN informed pt to continue above medication and she needs to see MD in November to for discussion of stopping the medication and appropriate follow up.  LOS sent to scheduling.

## 2016-12-20 ENCOUNTER — Telehealth: Payer: Self-pay | Admitting: Oncology

## 2016-12-20 NOTE — Telephone Encounter (Signed)
Left voicemail for patient regarding appt scheduled appt per 9/28 sch msg.

## 2016-12-22 ENCOUNTER — Telehealth: Payer: Self-pay

## 2016-12-22 NOTE — Telephone Encounter (Signed)
Vm received from pt stating she needs to reschedule her 10/12 appt.  msg sent to scheduling to call pt and reschedule to a time that is convenient for the pt.

## 2016-12-24 ENCOUNTER — Telehealth: Payer: Self-pay | Admitting: Oncology

## 2016-12-24 NOTE — Telephone Encounter (Signed)
Spoke with patient and rescheduled appts per 10/4 sch msg.

## 2016-12-31 ENCOUNTER — Ambulatory Visit: Payer: Medicare Other | Admitting: Oncology

## 2016-12-31 ENCOUNTER — Telehealth: Payer: Self-pay | Admitting: Oncology

## 2016-12-31 NOTE — Telephone Encounter (Signed)
Called patient to reschedule her 10/15 to 10/22

## 2017-01-03 ENCOUNTER — Ambulatory Visit: Payer: Medicare Other | Admitting: Oncology

## 2017-01-05 DIAGNOSIS — K219 Gastro-esophageal reflux disease without esophagitis: Secondary | ICD-10-CM | POA: Diagnosis not present

## 2017-01-05 DIAGNOSIS — R51 Headache: Secondary | ICD-10-CM | POA: Diagnosis not present

## 2017-01-05 DIAGNOSIS — I10 Essential (primary) hypertension: Secondary | ICD-10-CM | POA: Diagnosis not present

## 2017-01-05 DIAGNOSIS — F341 Dysthymic disorder: Secondary | ICD-10-CM | POA: Diagnosis not present

## 2017-01-05 DIAGNOSIS — E039 Hypothyroidism, unspecified: Secondary | ICD-10-CM | POA: Diagnosis not present

## 2017-01-05 DIAGNOSIS — I509 Heart failure, unspecified: Secondary | ICD-10-CM | POA: Diagnosis not present

## 2017-01-05 DIAGNOSIS — E7800 Pure hypercholesterolemia, unspecified: Secondary | ICD-10-CM | POA: Diagnosis not present

## 2017-01-05 DIAGNOSIS — I890 Lymphedema, not elsewhere classified: Secondary | ICD-10-CM | POA: Diagnosis not present

## 2017-01-05 DIAGNOSIS — D464 Refractory anemia, unspecified: Secondary | ICD-10-CM | POA: Diagnosis not present

## 2017-01-05 DIAGNOSIS — J309 Allergic rhinitis, unspecified: Secondary | ICD-10-CM | POA: Diagnosis not present

## 2017-01-09 NOTE — Progress Notes (Signed)
No show

## 2017-01-10 ENCOUNTER — Encounter: Payer: Self-pay | Admitting: Oncology

## 2017-01-10 ENCOUNTER — Ambulatory Visit: Payer: Medicare Other | Admitting: Oncology

## 2017-01-11 ENCOUNTER — Other Ambulatory Visit: Payer: Self-pay | Admitting: Allergy and Immunology

## 2017-01-12 DIAGNOSIS — I509 Heart failure, unspecified: Secondary | ICD-10-CM | POA: Diagnosis not present

## 2017-01-12 DIAGNOSIS — E7800 Pure hypercholesterolemia, unspecified: Secondary | ICD-10-CM | POA: Diagnosis not present

## 2017-01-12 DIAGNOSIS — E039 Hypothyroidism, unspecified: Secondary | ICD-10-CM | POA: Diagnosis not present

## 2017-01-12 DIAGNOSIS — I10 Essential (primary) hypertension: Secondary | ICD-10-CM | POA: Diagnosis not present

## 2017-01-12 DIAGNOSIS — J309 Allergic rhinitis, unspecified: Secondary | ICD-10-CM | POA: Diagnosis not present

## 2017-01-12 DIAGNOSIS — K219 Gastro-esophageal reflux disease without esophagitis: Secondary | ICD-10-CM | POA: Diagnosis not present

## 2017-01-12 DIAGNOSIS — Z888 Allergy status to other drugs, medicaments and biological substances status: Secondary | ICD-10-CM | POA: Diagnosis not present

## 2017-01-12 DIAGNOSIS — F17200 Nicotine dependence, unspecified, uncomplicated: Secondary | ICD-10-CM | POA: Diagnosis not present

## 2017-01-12 DIAGNOSIS — I890 Lymphedema, not elsewhere classified: Secondary | ICD-10-CM | POA: Diagnosis not present

## 2017-01-12 DIAGNOSIS — C50919 Malignant neoplasm of unspecified site of unspecified female breast: Secondary | ICD-10-CM | POA: Diagnosis not present

## 2017-01-12 DIAGNOSIS — F341 Dysthymic disorder: Secondary | ICD-10-CM | POA: Diagnosis not present

## 2017-01-12 DIAGNOSIS — R51 Headache: Secondary | ICD-10-CM | POA: Diagnosis not present

## 2017-02-02 DIAGNOSIS — I10 Essential (primary) hypertension: Secondary | ICD-10-CM | POA: Diagnosis not present

## 2017-02-12 ENCOUNTER — Other Ambulatory Visit: Payer: Self-pay | Admitting: Allergy and Immunology

## 2017-02-15 ENCOUNTER — Other Ambulatory Visit: Payer: Self-pay | Admitting: *Deleted

## 2017-02-15 MED ORDER — RANITIDINE HCL 300 MG PO TABS: 300.0000 mg | ORAL_TABLET | Freq: Every day | ORAL | 0 refills | Status: DC

## 2017-03-09 ENCOUNTER — Other Ambulatory Visit: Payer: Self-pay | Admitting: Allergy and Immunology

## 2017-03-09 NOTE — Telephone Encounter (Signed)
RF on Zantac denied, pt needs an OV

## 2017-03-19 ENCOUNTER — Other Ambulatory Visit: Payer: Self-pay | Admitting: Allergy and Immunology

## 2017-03-28 ENCOUNTER — Encounter: Payer: Self-pay | Admitting: Allergy

## 2017-03-28 ENCOUNTER — Ambulatory Visit (INDEPENDENT_AMBULATORY_CARE_PROVIDER_SITE_OTHER): Payer: Medicare Other | Admitting: Allergy

## 2017-03-28 VITALS — BP 130/88 | HR 76 | Resp 18

## 2017-03-28 DIAGNOSIS — F17200 Nicotine dependence, unspecified, uncomplicated: Secondary | ICD-10-CM | POA: Diagnosis not present

## 2017-03-28 DIAGNOSIS — L509 Urticaria, unspecified: Secondary | ICD-10-CM

## 2017-03-28 DIAGNOSIS — J4540 Moderate persistent asthma, uncomplicated: Secondary | ICD-10-CM

## 2017-03-28 MED ORDER — FEXOFENADINE HCL 180 MG PO TABS
180.0000 mg | ORAL_TABLET | Freq: Every day | ORAL | 11 refills | Status: DC
Start: 2017-03-28 — End: 2022-03-10

## 2017-03-28 MED ORDER — BUDESONIDE-FORMOTEROL FUMARATE 160-4.5 MCG/ACT IN AERO: INHALATION_SPRAY | RESPIRATORY_TRACT | 11 refills | Status: DC

## 2017-03-28 MED ORDER — MONTELUKAST SODIUM 10 MG PO TABS: 10.0000 mg | ORAL_TABLET | Freq: Every day | ORAL | 11 refills | Status: AC

## 2017-03-28 NOTE — Patient Instructions (Addendum)
  1. Continue Symbicort 160 - 2 inhalations one-2 times per day as needed depending on disease activity  2. Continue montelukast 10 mg daily  3. Continue fexofenadine 180 mg daily  4. Continue Ventolin HFA 2 puffs every 4-6 hours if needed  5. Return to clinic in 1 year or earlier if problem

## 2017-03-28 NOTE — Progress Notes (Signed)
Follow-up Note  RE: [name] MRN: [id number] DOB: Aug 02, 1960 Date of Office Visit: 03/28/2017   History of present illness: [name] is a 57 y.o. female presenting today for follow-up of asthma and urticaria. She was last seen in the office on 12/24/15 by Dr. Neldon Mc.    She reports she has CHF with a EF of 35% so she struggles with SOB throughout the day with occasional chest tightness.  She also reports wheezing that is worse night.  She denies any significant cough.  She has symbicort that she reports she uses only when she needs it which is when she feels like she is having severe difficulty breathing.  She states there are other techniques she usually tries first like cooling herself off as getting overheated can induce asthma type symptoms for her.  She reports she only uses the Symbicort as needed as when she uses this she has visual disturbance and will see things moving in her vision.  She states this also happens with other inhaled steroid medications she has tried in the past.  She reports she uses symbicort about twice a month.  She has albuterol but states she also uses this pretty rarely as she will reach for the Symbicort first if she needs to use an inhaler.  She is currently still smoking about 6 cigarettes a day and she states she is not interested in quitting or cutting back at this time.    With her hives she states she has not had an outbreak in the past 3 months.  But her last outbreak 3 months ago she did require prednisone.  She states she usually will have an outbreak if she is exposed to any animals she tries to stay away from.  She does take Allegra daily.  She is also supposed to take singular daily but she has not been doing this and is unsure why she stopped taking this medication but is willing to get back on this.  She states she follows with her cardiologist very frequently.  She continues to have issues with lymphedema secondary to breast cancer  treatment.  Review of systems: Review of Systems  Constitutional: Negative for chills, fever and malaise/fatigue.  HENT: Negative for congestion, ear discharge, ear pain, nosebleeds, sinus pain and sore throat.   Eyes: Negative for pain, discharge and redness.  Respiratory: Positive for shortness of breath and wheezing. Negative for hemoptysis and sputum production.   Cardiovascular: Negative for chest pain.  Gastrointestinal: Negative for abdominal pain, constipation, diarrhea, nausea and vomiting.  Musculoskeletal: Negative for myalgias.  Skin: Negative for itching and rash.  Neurological: Negative for headaches.    All other systems negative unless noted above in HPI  Past medical/social/surgical/family history have been reviewed and are unchanged unless specifically indicated below.  No changes  Medication List: Allergies as of 03/28/2017      Reactions   Aspirin Hives   Docetaxel Other (See Comments)   Chest tightness and pain   Morphine And Related Hives, Swelling   Penicillins Hives   Has patient had a PCN reaction causing immediate rash, facial/tongue/throat swelling, SOB or lightheadedness with hypotension: Yes Has patient had a PCN reaction causing severe rash involving mucus membranes or skin necrosis: No Has patient had a PCN reaction that required hospitalization No Has patient had a PCN reaction occurring within the last 10 years: Yes If all of the above answers are "NO", then may proceed with Cephalosporin use.   Clindamycin/lincomycin Hives  Dairy Aid [lactase] Hives   Lipitor [atorvastatin] Hives   Lisinopril Hives   Other Hives   Reaction to cats and dogs   Peanuts [peanut Oil] Hives      Medication List        Accurate as of 03/28/17  5:16 PM. Always use your most recent med list.          albuterol 108 (90 Base) MCG/ACT inhaler Commonly known as:  PROVENTIL HFA;VENTOLIN HFA Inhale 2 puffs into the lungs every 6 (six) hours as needed for wheezing  or shortness of breath.   amLODipine 5 MG tablet Commonly known as:  NORVASC   anastrozole 1 MG tablet Commonly known as:  ARIMIDEX Take 1 tablet (1 mg total) by mouth daily.   budesonide-formoterol 160-4.5 MCG/ACT inhaler Commonly known as:  SYMBICORT Inhale 2 puffs into the lungs 2 (two) times daily.   diphenhydrAMINE 25 MG tablet Commonly known as:  BENADRYL Take 37.5 mg by mouth daily as needed for itching.   diphenhydramine-acetaminophen 25-500 MG Tabs tablet Commonly known as:  TYLENOL PM Take 3 tablets by mouth at bedtime.   fexofenadine 180 MG tablet Commonly known as:  ALLEGRA Take 180 mg by mouth daily.   hydrALAZINE 50 MG tablet Commonly known as:  APRESOLINE   hydrocortisone cream 1 % Apply topically as needed for itching. Apply to affected area for itching   isosorbide mononitrate 60 MG 24 hr tablet Commonly known as:  IMDUR   levothyroxine 88 MCG tablet Commonly known as:  SYNTHROID, LEVOTHROID Take 88 mcg by mouth daily before breakfast.   Melatonin 10 MG Tabs Take 10 mg by mouth at bedtime as needed (restless legs).   metoprolol 200 MG 24 hr tablet Commonly known as:  TOPROL-XL Take 200 mg by mouth daily.   montelukast 10 MG tablet Commonly known as:  SINGULAIR Take 1 tablet (10 mg total) by mouth daily. as directed   nitroGLYCERIN 0.4 MG SL tablet Commonly known as:  NITROSTAT Place 0.4 mg under the tongue every 5 (five) minutes as needed for chest pain.   omeprazole 20 MG capsule Commonly known as:  PRILOSEC Take 20 mg by mouth 2 (two) times daily.   oxyCODONE-acetaminophen 7.5-325 MG tablet Commonly known as:  PERCOCET Take 1 tablet by mouth.   polyethylene glycol packet Commonly known as:  MIRALAX / GLYCOLAX Take 17 g by mouth daily. Mix in 8 oz liquid and drink   RHINOCORT ALLERGY 32 MCG/ACT nasal spray Generic drug:  budesonide Place 1 spray into both nostrils daily as needed for rhinitis or allergies.   spironolactone 50 MG  tablet Commonly known as:  ALDACTONE Take 50 mg by mouth daily.   zolpidem 10 MG tablet Commonly known as:  AMBIEN Take 10 mg by mouth at bedtime.       Known medication allergies: Allergies  Allergen Reactions  . Aspirin Hives  . Docetaxel Other (See Comments)    Chest tightness and pain  . Morphine And Related Hives and Swelling  . Penicillins Hives    Has patient had a PCN reaction causing immediate rash, facial/tongue/throat swelling, SOB or lightheadedness with hypotension: Yes Has patient had a PCN reaction causing severe rash involving mucus membranes or skin necrosis: No Has patient had a PCN reaction that required hospitalization No Has patient had a PCN reaction occurring within the last 10 years: Yes If all of the above answers are "NO", then may proceed with Cephalosporin use.  . Clindamycin/Lincomycin Hives  .  Dairy Aid [place]  . Lipitor [Atorvastatin] Hives  . Lisinopril Hives  . Other Hives    Reaction to cats and dogs  . Peanuts [Peanut Oil] Hives     Physical examination: Blood pressure 130/88, pulse 76, resp. rate 18.  General: Alert, interactive, in no acute distress. HEENT: PERRLA, TMs pearly gray, turbinates minimally edematous without discharge, post-pharynx non erythematous. Neck: Supple without lymphadenopathy. Lungs: Clear to auscultation without wheezing, rhonchi or rales. {no increased work of breathing. CV: Normal S1, S2 without murmurs. Abdomen: Nondistended, nontender. Skin: Warm and dry, without lesions or rashes. Extremities:  No clubbing, cyanosis.  Lymphedema of the left arm including the hand. Neuro:   Grossly intact.  Diagnositics/Labs:  Spirometry: FEV1: 1.75L  98%, FVC: 2.16L  96%, ratio consistent with nonobstructive pattern  Assessment and plan:   Moderate persistent asthma -she has compounded symptoms due to history of  congestive heart failure.  She has wheezing but denies any significant cough or chest  tightness  sensations.  She has had visual disturbances with the use of inhaled  corticosteroid and that she will use her Symbicort more so as a rescue which she will  continue to do so.  I have asked that she resume her Singulair daily to have a  maintenance medication on board.  She will continue to have routine close follow-up  with her cardiologist.  Urticaria -she tries her best to avoid known triggers which include animal exposure.  As  above I have asked her to resume use of her Singulair daily and continue her Allegra  which will help with control of urticaria. Tobacco dependence - I also discussed the importance of tobacco cessation however  she  is not interested in cutting back or quitting at this time and have asked that she let  us  know whenever she does have this interest  1. Continue Symbicort 160 - 2 inhalations one-2 times per day as needed depending on disease activity  2. Continue montelukast 10 mg daily  3. Continue fexofenadine 180 mg daily  4. Continue Ventolin HFA 2 puffs every 4-6 hours if needed  5. Return to clinic in 1 year or earlier if problem  I appreciate the opportunity to take part in Zenola's care. Please do not hesitate to contact me with questions.  Sincerely,   Prudy Feeler, MD Allergy/Immunology [facility]

## 2017-03-30 ENCOUNTER — Other Ambulatory Visit: Payer: Self-pay | Admitting: Allergy and Immunology

## 2017-05-16 DIAGNOSIS — J3489 Other specified disorders of nose and nasal sinuses: Secondary | ICD-10-CM | POA: Diagnosis not present

## 2017-05-16 DIAGNOSIS — J020 Streptococcal pharyngitis: Secondary | ICD-10-CM | POA: Diagnosis not present

## 2017-05-23 DIAGNOSIS — F17200 Nicotine dependence, unspecified, uncomplicated: Secondary | ICD-10-CM | POA: Diagnosis not present

## 2017-05-23 DIAGNOSIS — I129 Hypertensive chronic kidney disease with stage 1 through stage 4 chronic kidney disease, or unspecified chronic kidney disease: Secondary | ICD-10-CM | POA: Diagnosis not present

## 2017-05-23 DIAGNOSIS — N183 Chronic kidney disease, stage 3 (moderate): Secondary | ICD-10-CM | POA: Diagnosis not present

## 2017-05-23 DIAGNOSIS — I5032 Chronic diastolic (congestive) heart failure: Secondary | ICD-10-CM | POA: Diagnosis not present

## 2017-05-23 DIAGNOSIS — I10 Essential (primary) hypertension: Secondary | ICD-10-CM | POA: Diagnosis not present

## 2017-05-23 DIAGNOSIS — Z720 Tobacco use: Secondary | ICD-10-CM | POA: Diagnosis not present

## 2017-05-25 DIAGNOSIS — Z888 Allergy status to other drugs, medicaments and biological substances status: Secondary | ICD-10-CM | POA: Diagnosis not present

## 2017-05-25 DIAGNOSIS — I509 Heart failure, unspecified: Secondary | ICD-10-CM | POA: Diagnosis not present

## 2017-05-25 DIAGNOSIS — D464 Refractory anemia, unspecified: Secondary | ICD-10-CM | POA: Diagnosis not present

## 2017-05-25 DIAGNOSIS — K219 Gastro-esophageal reflux disease without esophagitis: Secondary | ICD-10-CM | POA: Diagnosis not present

## 2017-05-25 DIAGNOSIS — F17200 Nicotine dependence, unspecified, uncomplicated: Secondary | ICD-10-CM | POA: Diagnosis not present

## 2017-05-25 DIAGNOSIS — E7800 Pure hypercholesterolemia, unspecified: Secondary | ICD-10-CM | POA: Diagnosis not present

## 2017-05-25 DIAGNOSIS — I890 Lymphedema, not elsewhere classified: Secondary | ICD-10-CM | POA: Diagnosis not present

## 2017-05-25 DIAGNOSIS — E039 Hypothyroidism, unspecified: Secondary | ICD-10-CM | POA: Diagnosis not present

## 2017-05-25 DIAGNOSIS — R51 Headache: Secondary | ICD-10-CM | POA: Diagnosis not present

## 2017-05-25 DIAGNOSIS — C50919 Malignant neoplasm of unspecified site of unspecified female breast: Secondary | ICD-10-CM | POA: Diagnosis not present

## 2017-05-25 DIAGNOSIS — J309 Allergic rhinitis, unspecified: Secondary | ICD-10-CM | POA: Diagnosis not present

## 2017-05-25 DIAGNOSIS — I10 Essential (primary) hypertension: Secondary | ICD-10-CM | POA: Diagnosis not present

## 2017-06-21 DIAGNOSIS — Z888 Allergy status to other drugs, medicaments and biological substances status: Secondary | ICD-10-CM | POA: Diagnosis not present

## 2017-06-21 DIAGNOSIS — J309 Allergic rhinitis, unspecified: Secondary | ICD-10-CM | POA: Diagnosis not present

## 2017-06-21 DIAGNOSIS — I890 Lymphedema, not elsewhere classified: Secondary | ICD-10-CM | POA: Diagnosis not present

## 2017-06-21 DIAGNOSIS — E039 Hypothyroidism, unspecified: Secondary | ICD-10-CM | POA: Diagnosis not present

## 2017-06-21 DIAGNOSIS — F17200 Nicotine dependence, unspecified, uncomplicated: Secondary | ICD-10-CM | POA: Diagnosis not present

## 2017-06-21 DIAGNOSIS — K219 Gastro-esophageal reflux disease without esophagitis: Secondary | ICD-10-CM | POA: Diagnosis not present

## 2017-06-21 DIAGNOSIS — D464 Refractory anemia, unspecified: Secondary | ICD-10-CM | POA: Diagnosis not present

## 2017-06-21 DIAGNOSIS — E7800 Pure hypercholesterolemia, unspecified: Secondary | ICD-10-CM | POA: Diagnosis not present

## 2017-06-21 DIAGNOSIS — R51 Headache: Secondary | ICD-10-CM | POA: Diagnosis not present

## 2017-06-21 DIAGNOSIS — I10 Essential (primary) hypertension: Secondary | ICD-10-CM | POA: Diagnosis not present

## 2017-06-21 DIAGNOSIS — I509 Heart failure, unspecified: Secondary | ICD-10-CM | POA: Diagnosis not present

## 2017-06-21 DIAGNOSIS — C50919 Malignant neoplasm of unspecified site of unspecified female breast: Secondary | ICD-10-CM | POA: Diagnosis not present

## 2017-06-27 DIAGNOSIS — R51 Headache: Secondary | ICD-10-CM | POA: Diagnosis not present

## 2017-06-27 DIAGNOSIS — F17200 Nicotine dependence, unspecified, uncomplicated: Secondary | ICD-10-CM | POA: Diagnosis not present

## 2017-06-27 DIAGNOSIS — E039 Hypothyroidism, unspecified: Secondary | ICD-10-CM | POA: Diagnosis not present

## 2017-06-27 DIAGNOSIS — D464 Refractory anemia, unspecified: Secondary | ICD-10-CM | POA: Diagnosis not present

## 2017-06-27 DIAGNOSIS — I890 Lymphedema, not elsewhere classified: Secondary | ICD-10-CM | POA: Diagnosis not present

## 2017-06-27 DIAGNOSIS — I509 Heart failure, unspecified: Secondary | ICD-10-CM | POA: Diagnosis not present

## 2017-06-27 DIAGNOSIS — J309 Allergic rhinitis, unspecified: Secondary | ICD-10-CM | POA: Diagnosis not present

## 2017-06-27 DIAGNOSIS — C50919 Malignant neoplasm of unspecified site of unspecified female breast: Secondary | ICD-10-CM | POA: Diagnosis not present

## 2017-06-27 DIAGNOSIS — Z888 Allergy status to other drugs, medicaments and biological substances status: Secondary | ICD-10-CM | POA: Diagnosis not present

## 2017-06-27 DIAGNOSIS — M25561 Pain in right knee: Secondary | ICD-10-CM | POA: Diagnosis not present

## 2017-06-27 DIAGNOSIS — E7800 Pure hypercholesterolemia, unspecified: Secondary | ICD-10-CM | POA: Diagnosis not present

## 2017-06-27 DIAGNOSIS — K219 Gastro-esophageal reflux disease without esophagitis: Secondary | ICD-10-CM | POA: Diagnosis not present

## 2017-06-28 DIAGNOSIS — E669 Obesity, unspecified: Secondary | ICD-10-CM | POA: Diagnosis not present

## 2017-06-28 DIAGNOSIS — E785 Hyperlipidemia, unspecified: Secondary | ICD-10-CM | POA: Diagnosis not present

## 2017-06-28 DIAGNOSIS — Z6841 Body Mass Index (BMI) 40.0 and over, adult: Secondary | ICD-10-CM | POA: Diagnosis not present

## 2017-06-28 DIAGNOSIS — Z0000 Encounter for general adult medical examination without abnormal findings: Secondary | ICD-10-CM | POA: Diagnosis not present

## 2017-07-19 DIAGNOSIS — J309 Allergic rhinitis, unspecified: Secondary | ICD-10-CM | POA: Diagnosis not present

## 2017-07-19 DIAGNOSIS — E7800 Pure hypercholesterolemia, unspecified: Secondary | ICD-10-CM | POA: Diagnosis not present

## 2017-07-19 DIAGNOSIS — K219 Gastro-esophageal reflux disease without esophagitis: Secondary | ICD-10-CM | POA: Diagnosis not present

## 2017-07-19 DIAGNOSIS — Z888 Allergy status to other drugs, medicaments and biological substances status: Secondary | ICD-10-CM | POA: Diagnosis not present

## 2017-08-16 DIAGNOSIS — Z888 Allergy status to other drugs, medicaments and biological substances status: Secondary | ICD-10-CM | POA: Diagnosis not present

## 2017-08-16 DIAGNOSIS — E7800 Pure hypercholesterolemia, unspecified: Secondary | ICD-10-CM | POA: Diagnosis not present

## 2017-08-16 DIAGNOSIS — K219 Gastro-esophageal reflux disease without esophagitis: Secondary | ICD-10-CM | POA: Diagnosis not present

## 2017-08-16 DIAGNOSIS — J309 Allergic rhinitis, unspecified: Secondary | ICD-10-CM | POA: Diagnosis not present

## 2017-09-09 ENCOUNTER — Telehealth: Payer: Self-pay | Admitting: *Deleted

## 2017-09-09 NOTE — Telephone Encounter (Signed)
Faxed [facility] to [facility], attn: Audie Pinto; release [phone]

## 2017-09-12 ENCOUNTER — Other Ambulatory Visit: Payer: Self-pay | Admitting: Allergy and Immunology

## 2017-09-13 DIAGNOSIS — Z888 Allergy status to other drugs, medicaments and biological substances status: Secondary | ICD-10-CM | POA: Diagnosis not present

## 2017-09-13 DIAGNOSIS — Z1339 Encounter for screening examination for other mental health and behavioral disorders: Secondary | ICD-10-CM | POA: Diagnosis not present

## 2017-09-13 DIAGNOSIS — F17200 Nicotine dependence, unspecified, uncomplicated: Secondary | ICD-10-CM | POA: Diagnosis not present

## 2017-09-13 DIAGNOSIS — E7800 Pure hypercholesterolemia, unspecified: Secondary | ICD-10-CM | POA: Diagnosis not present

## 2017-09-13 DIAGNOSIS — K219 Gastro-esophageal reflux disease without esophagitis: Secondary | ICD-10-CM | POA: Diagnosis not present

## 2017-09-13 DIAGNOSIS — J309 Allergic rhinitis, unspecified: Secondary | ICD-10-CM | POA: Diagnosis not present

## 2017-09-16 DIAGNOSIS — M7521 Bicipital tendinitis, right shoulder: Secondary | ICD-10-CM | POA: Diagnosis not present

## 2017-09-16 DIAGNOSIS — M79601 Pain in right arm: Secondary | ICD-10-CM | POA: Diagnosis not present

## 2017-10-11 DIAGNOSIS — E7800 Pure hypercholesterolemia, unspecified: Secondary | ICD-10-CM | POA: Diagnosis not present

## 2017-10-11 DIAGNOSIS — Z888 Allergy status to other drugs, medicaments and biological substances status: Secondary | ICD-10-CM | POA: Diagnosis not present

## 2017-10-11 DIAGNOSIS — F17200 Nicotine dependence, unspecified, uncomplicated: Secondary | ICD-10-CM | POA: Diagnosis not present

## 2017-10-11 DIAGNOSIS — J309 Allergic rhinitis, unspecified: Secondary | ICD-10-CM | POA: Diagnosis not present

## 2017-10-11 DIAGNOSIS — K219 Gastro-esophageal reflux disease without esophagitis: Secondary | ICD-10-CM | POA: Diagnosis not present

## 2017-11-08 DIAGNOSIS — J309 Allergic rhinitis, unspecified: Secondary | ICD-10-CM | POA: Diagnosis not present

## 2017-11-08 DIAGNOSIS — Z888 Allergy status to other drugs, medicaments and biological substances status: Secondary | ICD-10-CM | POA: Diagnosis not present

## 2017-11-08 DIAGNOSIS — K219 Gastro-esophageal reflux disease without esophagitis: Secondary | ICD-10-CM | POA: Diagnosis not present

## 2017-11-08 DIAGNOSIS — E7800 Pure hypercholesterolemia, unspecified: Secondary | ICD-10-CM | POA: Diagnosis not present

## 2017-11-08 DIAGNOSIS — F17200 Nicotine dependence, unspecified, uncomplicated: Secondary | ICD-10-CM | POA: Diagnosis not present

## 2017-11-08 DIAGNOSIS — I10 Essential (primary) hypertension: Secondary | ICD-10-CM | POA: Diagnosis not present

## 2017-12-06 DIAGNOSIS — K219 Gastro-esophageal reflux disease without esophagitis: Secondary | ICD-10-CM | POA: Diagnosis not present

## 2017-12-06 DIAGNOSIS — E7800 Pure hypercholesterolemia, unspecified: Secondary | ICD-10-CM | POA: Diagnosis not present

## 2017-12-06 DIAGNOSIS — J309 Allergic rhinitis, unspecified: Secondary | ICD-10-CM | POA: Diagnosis not present

## 2017-12-06 DIAGNOSIS — Z888 Allergy status to other drugs, medicaments and biological substances status: Secondary | ICD-10-CM | POA: Diagnosis not present

## 2017-12-06 DIAGNOSIS — F17200 Nicotine dependence, unspecified, uncomplicated: Secondary | ICD-10-CM | POA: Diagnosis not present

## 2017-12-26 DIAGNOSIS — R112 Nausea with vomiting, unspecified: Secondary | ICD-10-CM | POA: Diagnosis not present

## 2017-12-26 DIAGNOSIS — R1013 Epigastric pain: Secondary | ICD-10-CM | POA: Diagnosis not present

## 2018-01-04 DIAGNOSIS — Z888 Allergy status to other drugs, medicaments and biological substances status: Secondary | ICD-10-CM | POA: Diagnosis not present

## 2018-01-04 DIAGNOSIS — F17200 Nicotine dependence, unspecified, uncomplicated: Secondary | ICD-10-CM | POA: Diagnosis not present

## 2018-01-04 DIAGNOSIS — K219 Gastro-esophageal reflux disease without esophagitis: Secondary | ICD-10-CM | POA: Diagnosis not present

## 2018-01-04 DIAGNOSIS — E7800 Pure hypercholesterolemia, unspecified: Secondary | ICD-10-CM | POA: Diagnosis not present

## 2018-01-04 DIAGNOSIS — J309 Allergic rhinitis, unspecified: Secondary | ICD-10-CM | POA: Diagnosis not present

## 2018-02-01 DIAGNOSIS — Z888 Allergy status to other drugs, medicaments and biological substances status: Secondary | ICD-10-CM | POA: Diagnosis not present

## 2018-02-01 DIAGNOSIS — J309 Allergic rhinitis, unspecified: Secondary | ICD-10-CM | POA: Diagnosis not present

## 2018-02-01 DIAGNOSIS — K219 Gastro-esophageal reflux disease without esophagitis: Secondary | ICD-10-CM | POA: Diagnosis not present

## 2018-02-01 DIAGNOSIS — F17200 Nicotine dependence, unspecified, uncomplicated: Secondary | ICD-10-CM | POA: Diagnosis not present

## 2018-02-01 DIAGNOSIS — E7800 Pure hypercholesterolemia, unspecified: Secondary | ICD-10-CM | POA: Diagnosis not present

## 2018-02-01 DIAGNOSIS — I10 Essential (primary) hypertension: Secondary | ICD-10-CM | POA: Diagnosis not present

## 2018-03-01 DIAGNOSIS — J309 Allergic rhinitis, unspecified: Secondary | ICD-10-CM | POA: Diagnosis not present

## 2018-03-01 DIAGNOSIS — F17200 Nicotine dependence, unspecified, uncomplicated: Secondary | ICD-10-CM | POA: Diagnosis not present

## 2018-03-01 DIAGNOSIS — E7800 Pure hypercholesterolemia, unspecified: Secondary | ICD-10-CM | POA: Diagnosis not present

## 2018-03-01 DIAGNOSIS — Z888 Allergy status to other drugs, medicaments and biological substances status: Secondary | ICD-10-CM | POA: Diagnosis not present

## 2018-03-01 DIAGNOSIS — K219 Gastro-esophageal reflux disease without esophagitis: Secondary | ICD-10-CM | POA: Diagnosis not present

## 2018-03-29 DIAGNOSIS — K219 Gastro-esophageal reflux disease without esophagitis: Secondary | ICD-10-CM | POA: Diagnosis not present

## 2018-03-29 DIAGNOSIS — Z888 Allergy status to other drugs, medicaments and biological substances status: Secondary | ICD-10-CM | POA: Diagnosis not present

## 2018-03-29 DIAGNOSIS — J309 Allergic rhinitis, unspecified: Secondary | ICD-10-CM | POA: Diagnosis not present

## 2018-03-29 DIAGNOSIS — F17200 Nicotine dependence, unspecified, uncomplicated: Secondary | ICD-10-CM | POA: Diagnosis not present

## 2018-03-29 DIAGNOSIS — E7800 Pure hypercholesterolemia, unspecified: Secondary | ICD-10-CM | POA: Diagnosis not present

## 2018-04-26 DIAGNOSIS — K219 Gastro-esophageal reflux disease without esophagitis: Secondary | ICD-10-CM | POA: Diagnosis not present

## 2018-04-26 DIAGNOSIS — F17200 Nicotine dependence, unspecified, uncomplicated: Secondary | ICD-10-CM | POA: Diagnosis not present

## 2018-04-26 DIAGNOSIS — E7800 Pure hypercholesterolemia, unspecified: Secondary | ICD-10-CM | POA: Diagnosis not present

## 2018-04-26 DIAGNOSIS — Z888 Allergy status to other drugs, medicaments and biological substances status: Secondary | ICD-10-CM | POA: Diagnosis not present

## 2018-04-26 DIAGNOSIS — J309 Allergic rhinitis, unspecified: Secondary | ICD-10-CM | POA: Diagnosis not present

## 2018-05-24 DIAGNOSIS — F17200 Nicotine dependence, unspecified, uncomplicated: Secondary | ICD-10-CM | POA: Diagnosis not present

## 2018-05-24 DIAGNOSIS — K219 Gastro-esophageal reflux disease without esophagitis: Secondary | ICD-10-CM | POA: Diagnosis not present

## 2018-05-24 DIAGNOSIS — I10 Essential (primary) hypertension: Secondary | ICD-10-CM | POA: Diagnosis not present

## 2018-05-24 DIAGNOSIS — I509 Heart failure, unspecified: Secondary | ICD-10-CM | POA: Diagnosis not present

## 2018-05-24 DIAGNOSIS — D464 Refractory anemia, unspecified: Secondary | ICD-10-CM | POA: Diagnosis not present

## 2018-06-05 DIAGNOSIS — H35352 Cystoid macular degeneration, left eye: Secondary | ICD-10-CM | POA: Diagnosis not present

## 2018-06-05 DIAGNOSIS — H25813 Combined forms of age-related cataract, bilateral: Secondary | ICD-10-CM | POA: Diagnosis not present

## 2018-06-05 DIAGNOSIS — H524 Presbyopia: Secondary | ICD-10-CM | POA: Diagnosis not present

## 2018-06-05 DIAGNOSIS — H35373 Puckering of macula, bilateral: Secondary | ICD-10-CM | POA: Diagnosis not present

## 2018-06-05 DIAGNOSIS — H5213 Myopia, bilateral: Secondary | ICD-10-CM | POA: Diagnosis not present

## 2018-06-05 DIAGNOSIS — I10 Essential (primary) hypertension: Secondary | ICD-10-CM | POA: Diagnosis not present

## 2018-06-05 DIAGNOSIS — H52223 Regular astigmatism, bilateral: Secondary | ICD-10-CM | POA: Diagnosis not present

## 2018-06-09 ENCOUNTER — Other Ambulatory Visit: Payer: Self-pay | Admitting: Cardiology

## 2018-06-21 DIAGNOSIS — C50919 Malignant neoplasm of unspecified site of unspecified female breast: Secondary | ICD-10-CM | POA: Diagnosis not present

## 2018-06-21 DIAGNOSIS — I509 Heart failure, unspecified: Secondary | ICD-10-CM | POA: Diagnosis not present

## 2018-06-21 DIAGNOSIS — K219 Gastro-esophageal reflux disease without esophagitis: Secondary | ICD-10-CM | POA: Diagnosis not present

## 2018-06-21 DIAGNOSIS — D464 Refractory anemia, unspecified: Secondary | ICD-10-CM | POA: Diagnosis not present

## 2018-06-21 DIAGNOSIS — F17200 Nicotine dependence, unspecified, uncomplicated: Secondary | ICD-10-CM | POA: Diagnosis not present

## 2018-07-12 ENCOUNTER — Other Ambulatory Visit: Payer: Self-pay | Admitting: Cardiology

## 2018-07-12 NOTE — Telephone Encounter (Signed)
Will need OV for future refills

## 2018-07-19 DIAGNOSIS — F17200 Nicotine dependence, unspecified, uncomplicated: Secondary | ICD-10-CM | POA: Diagnosis not present

## 2018-07-19 DIAGNOSIS — K219 Gastro-esophageal reflux disease without esophagitis: Secondary | ICD-10-CM | POA: Diagnosis not present

## 2018-07-19 DIAGNOSIS — C50919 Malignant neoplasm of unspecified site of unspecified female breast: Secondary | ICD-10-CM | POA: Diagnosis not present

## 2018-07-19 DIAGNOSIS — D464 Refractory anemia, unspecified: Secondary | ICD-10-CM | POA: Diagnosis not present

## 2018-07-19 DIAGNOSIS — I509 Heart failure, unspecified: Secondary | ICD-10-CM | POA: Diagnosis not present

## 2018-08-15 DIAGNOSIS — Z1331 Encounter for screening for depression: Secondary | ICD-10-CM | POA: Diagnosis not present

## 2018-08-15 DIAGNOSIS — Z0000 Encounter for general adult medical examination without abnormal findings: Secondary | ICD-10-CM | POA: Diagnosis not present

## 2018-08-15 DIAGNOSIS — Z6837 Body mass index (BMI) 37.0-37.9, adult: Secondary | ICD-10-CM | POA: Diagnosis not present

## 2018-08-15 DIAGNOSIS — E785 Hyperlipidemia, unspecified: Secondary | ICD-10-CM | POA: Diagnosis not present

## 2018-08-15 DIAGNOSIS — Z9181 History of falling: Secondary | ICD-10-CM | POA: Diagnosis not present

## 2018-08-16 DIAGNOSIS — I10 Essential (primary) hypertension: Secondary | ICD-10-CM | POA: Diagnosis not present

## 2018-08-16 DIAGNOSIS — K219 Gastro-esophageal reflux disease without esophagitis: Secondary | ICD-10-CM | POA: Diagnosis not present

## 2018-08-16 DIAGNOSIS — I509 Heart failure, unspecified: Secondary | ICD-10-CM | POA: Diagnosis not present

## 2018-08-16 DIAGNOSIS — F17200 Nicotine dependence, unspecified, uncomplicated: Secondary | ICD-10-CM | POA: Diagnosis not present

## 2018-08-16 DIAGNOSIS — D464 Refractory anemia, unspecified: Secondary | ICD-10-CM | POA: Diagnosis not present

## 2018-08-22 DIAGNOSIS — H25812 Combined forms of age-related cataract, left eye: Secondary | ICD-10-CM | POA: Diagnosis not present

## 2018-08-22 DIAGNOSIS — Z01818 Encounter for other preprocedural examination: Secondary | ICD-10-CM | POA: Diagnosis not present

## 2018-09-07 ENCOUNTER — Other Ambulatory Visit: Payer: Self-pay | Admitting: Cardiology

## 2018-09-13 DIAGNOSIS — K219 Gastro-esophageal reflux disease without esophagitis: Secondary | ICD-10-CM | POA: Diagnosis not present

## 2018-09-13 DIAGNOSIS — D464 Refractory anemia, unspecified: Secondary | ICD-10-CM | POA: Diagnosis not present

## 2018-09-13 DIAGNOSIS — F17200 Nicotine dependence, unspecified, uncomplicated: Secondary | ICD-10-CM | POA: Diagnosis not present

## 2018-09-13 DIAGNOSIS — I10 Essential (primary) hypertension: Secondary | ICD-10-CM | POA: Diagnosis not present

## 2018-09-13 DIAGNOSIS — I509 Heart failure, unspecified: Secondary | ICD-10-CM | POA: Diagnosis not present

## 2018-09-19 DIAGNOSIS — H52223 Regular astigmatism, bilateral: Secondary | ICD-10-CM | POA: Diagnosis not present

## 2018-09-19 DIAGNOSIS — F17210 Nicotine dependence, cigarettes, uncomplicated: Secondary | ICD-10-CM | POA: Diagnosis not present

## 2018-09-19 DIAGNOSIS — H259 Unspecified age-related cataract: Secondary | ICD-10-CM | POA: Diagnosis not present

## 2018-09-19 DIAGNOSIS — E079 Disorder of thyroid, unspecified: Secondary | ICD-10-CM | POA: Diagnosis not present

## 2018-09-19 DIAGNOSIS — Z8659 Personal history of other mental and behavioral disorders: Secondary | ICD-10-CM | POA: Diagnosis not present

## 2018-09-19 DIAGNOSIS — Z961 Presence of intraocular lens: Secondary | ICD-10-CM | POA: Diagnosis not present

## 2018-09-19 DIAGNOSIS — H3552 Pigmentary retinal dystrophy: Secondary | ICD-10-CM | POA: Diagnosis not present

## 2018-09-19 DIAGNOSIS — I10 Essential (primary) hypertension: Secondary | ICD-10-CM | POA: Diagnosis not present

## 2018-09-19 DIAGNOSIS — Z79899 Other long term (current) drug therapy: Secondary | ICD-10-CM | POA: Diagnosis not present

## 2018-09-19 DIAGNOSIS — H524 Presbyopia: Secondary | ICD-10-CM | POA: Diagnosis not present

## 2018-09-19 DIAGNOSIS — H5213 Myopia, bilateral: Secondary | ICD-10-CM | POA: Diagnosis not present

## 2018-09-19 DIAGNOSIS — H26492 Other secondary cataract, left eye: Secondary | ICD-10-CM | POA: Diagnosis not present

## 2018-09-19 DIAGNOSIS — H25812 Combined forms of age-related cataract, left eye: Secondary | ICD-10-CM | POA: Diagnosis not present

## 2018-10-04 DIAGNOSIS — Z20828 Contact with and (suspected) exposure to other viral communicable diseases: Secondary | ICD-10-CM | POA: Diagnosis not present

## 2018-10-05 ENCOUNTER — Other Ambulatory Visit: Payer: Self-pay | Admitting: Cardiology

## 2018-10-07 ENCOUNTER — Other Ambulatory Visit: Payer: Self-pay | Admitting: Cardiology

## 2018-10-11 DIAGNOSIS — D644 Congenital dyserythropoietic anemia: Secondary | ICD-10-CM | POA: Diagnosis not present

## 2018-10-11 DIAGNOSIS — I10 Essential (primary) hypertension: Secondary | ICD-10-CM | POA: Diagnosis not present

## 2018-10-11 DIAGNOSIS — K219 Gastro-esophageal reflux disease without esophagitis: Secondary | ICD-10-CM | POA: Diagnosis not present

## 2018-10-11 DIAGNOSIS — F17200 Nicotine dependence, unspecified, uncomplicated: Secondary | ICD-10-CM | POA: Diagnosis not present

## 2018-10-11 DIAGNOSIS — I509 Heart failure, unspecified: Secondary | ICD-10-CM | POA: Diagnosis not present

## 2018-10-13 DIAGNOSIS — E7800 Pure hypercholesterolemia, unspecified: Secondary | ICD-10-CM | POA: Diagnosis not present

## 2018-10-13 DIAGNOSIS — F17200 Nicotine dependence, unspecified, uncomplicated: Secondary | ICD-10-CM | POA: Diagnosis not present

## 2018-10-13 DIAGNOSIS — K219 Gastro-esophageal reflux disease without esophagitis: Secondary | ICD-10-CM | POA: Diagnosis not present

## 2018-10-13 DIAGNOSIS — D464 Refractory anemia, unspecified: Secondary | ICD-10-CM | POA: Diagnosis not present

## 2018-10-13 DIAGNOSIS — I509 Heart failure, unspecified: Secondary | ICD-10-CM | POA: Diagnosis not present

## 2018-10-17 HISTORY — PX: CATARACT EXTRACTION, BILATERAL: SHX1313

## 2018-10-20 DIAGNOSIS — I509 Heart failure, unspecified: Secondary | ICD-10-CM | POA: Diagnosis not present

## 2018-10-20 DIAGNOSIS — D464 Refractory anemia, unspecified: Secondary | ICD-10-CM | POA: Diagnosis not present

## 2018-10-20 DIAGNOSIS — K219 Gastro-esophageal reflux disease without esophagitis: Secondary | ICD-10-CM | POA: Diagnosis not present

## 2018-10-20 DIAGNOSIS — E7800 Pure hypercholesterolemia, unspecified: Secondary | ICD-10-CM | POA: Diagnosis not present

## 2018-10-20 DIAGNOSIS — F17200 Nicotine dependence, unspecified, uncomplicated: Secondary | ICD-10-CM | POA: Diagnosis not present

## 2018-10-24 DIAGNOSIS — K219 Gastro-esophageal reflux disease without esophagitis: Secondary | ICD-10-CM | POA: Diagnosis not present

## 2018-10-24 DIAGNOSIS — D464 Refractory anemia, unspecified: Secondary | ICD-10-CM | POA: Diagnosis not present

## 2018-10-24 DIAGNOSIS — I509 Heart failure, unspecified: Secondary | ICD-10-CM | POA: Diagnosis not present

## 2018-10-24 DIAGNOSIS — C50919 Malignant neoplasm of unspecified site of unspecified female breast: Secondary | ICD-10-CM | POA: Diagnosis not present

## 2018-10-31 ENCOUNTER — Other Ambulatory Visit: Payer: Self-pay | Admitting: Cardiology

## 2018-11-01 DIAGNOSIS — Z9842 Cataract extraction status, left eye: Secondary | ICD-10-CM | POA: Diagnosis not present

## 2018-11-01 DIAGNOSIS — H25811 Combined forms of age-related cataract, right eye: Secondary | ICD-10-CM | POA: Diagnosis not present

## 2018-11-01 DIAGNOSIS — Z961 Presence of intraocular lens: Secondary | ICD-10-CM | POA: Diagnosis not present

## 2018-11-01 DIAGNOSIS — Z9841 Cataract extraction status, right eye: Secondary | ICD-10-CM | POA: Diagnosis not present

## 2018-11-06 ENCOUNTER — Other Ambulatory Visit: Payer: Self-pay

## 2018-11-06 MED ORDER — HYDRALAZINE HCL 50 MG PO TABS: 50.0000 mg | ORAL_TABLET | Freq: Three times a day (TID) | ORAL | 0 refills | Status: DC

## 2018-11-09 DIAGNOSIS — Z9841 Cataract extraction status, right eye: Secondary | ICD-10-CM | POA: Diagnosis not present

## 2018-11-09 DIAGNOSIS — Z961 Presence of intraocular lens: Secondary | ICD-10-CM | POA: Diagnosis not present

## 2018-11-09 DIAGNOSIS — Z9842 Cataract extraction status, left eye: Secondary | ICD-10-CM | POA: Diagnosis not present

## 2018-11-09 DIAGNOSIS — H25811 Combined forms of age-related cataract, right eye: Secondary | ICD-10-CM | POA: Diagnosis not present

## 2018-11-15 ENCOUNTER — Encounter: Payer: Self-pay | Admitting: Cardiology

## 2018-11-15 ENCOUNTER — Ambulatory Visit (INDEPENDENT_AMBULATORY_CARE_PROVIDER_SITE_OTHER): Payer: Medicare Other | Admitting: Cardiology

## 2018-11-15 ENCOUNTER — Other Ambulatory Visit: Payer: Self-pay

## 2018-11-15 VITALS — BP 144/81 | HR 77 | Temp 98.4°F | Ht <= 58 in | Wt 190.8 lb

## 2018-11-15 DIAGNOSIS — I5032 Chronic diastolic (congestive) heart failure: Secondary | ICD-10-CM | POA: Diagnosis not present

## 2018-11-15 DIAGNOSIS — I129 Hypertensive chronic kidney disease with stage 1 through stage 4 chronic kidney disease, or unspecified chronic kidney disease: Secondary | ICD-10-CM

## 2018-11-15 DIAGNOSIS — N183 Chronic kidney disease, stage 3 unspecified: Secondary | ICD-10-CM

## 2018-11-15 DIAGNOSIS — R0789 Other chest pain: Secondary | ICD-10-CM | POA: Diagnosis not present

## 2018-11-15 DIAGNOSIS — R0609 Other forms of dyspnea: Secondary | ICD-10-CM

## 2018-11-15 DIAGNOSIS — I10 Essential (primary) hypertension: Secondary | ICD-10-CM

## 2018-11-15 DIAGNOSIS — F17200 Nicotine dependence, unspecified, uncomplicated: Secondary | ICD-10-CM

## 2018-11-15 MED ORDER — METOPROLOL SUCCINATE ER 200 MG PO TB24: 200.0000 mg | ORAL_TABLET | Freq: Every day | ORAL | 3 refills | Status: DC

## 2018-11-15 MED ORDER — ISOSORBIDE MONONITRATE ER 60 MG PO TB24: 60.0000 mg | ORAL_TABLET | Freq: Every day | ORAL | 3 refills | Status: DC

## 2018-11-15 MED ORDER — SPIRONOLACTONE 50 MG PO TABS: 50.0000 mg | ORAL_TABLET | Freq: Every morning | ORAL | 3 refills | Status: DC

## 2018-11-15 MED ORDER — NITROGLYCERIN 0.4 MG SL SUBL: 0.4000 mg | SUBLINGUAL_TABLET | SUBLINGUAL | 4 refills | Status: DC | PRN

## 2018-11-15 MED ORDER — AMLODIPINE BESYLATE 5 MG PO TABS: 5.0000 mg | ORAL_TABLET | Freq: Every day | ORAL | 3 refills | Status: DC

## 2018-11-15 MED ORDER — HYDRALAZINE HCL 50 MG PO TABS: 50.0000 mg | ORAL_TABLET | Freq: Three times a day (TID) | ORAL | 3 refills | Status: DC

## 2018-11-15 NOTE — Progress Notes (Signed)
Primary Physician:  Cher Nakai, MD   Patient ID: [name], female    DOB: April 23, 1960, 58 y.o.   MRN: [id number]  Subjective:    Chief Complaint  Patient presents with  . Congestive Heart Failure  . Hypertension  . New Patient (Initial Visit)    HPI: [name]  is a 58 y.o. female  with history of left breast cancer status post lumpectomy and received radiation and chemotherapy 2012 and had developed nonischemic dilated cardiomyopathy with severe LV systolic dysfunction and normal coronary arteries at that time. With aggressive medical therapy her ejection fraction improved in 2015. She also has hypertension,  hyperlipidemia, stage III chronic kidney disease, hyperlipidemia, morbid obesity, tobacco use disorder. She was last seen in march 2019 and now presents for CHF follow up.  She is presently doing well without any significant complaints today. She has dyspnea on exertion that is unchanged. States that she is able to do her normal household chores without any difficulty. She does have some occasional episodes of chest discomfort after eating, has history of GERD. She is requesting refill on nitroglycerin. No worsening leg edema, no PND or orthopnea. She has been working to lose weight. States labs with her PCP are stable.   She was given prescription for Chantix at her last appointment; however, was unable to afford this. She does report that she has been able to cut back to 6 cigarettes per day.   Past Medical History:  Diagnosis Date  . Anemia 05/19/2011   "w/cancer tx"  . Animal dander allergy    "cats and dogs"  . Anxiety   . Arthritis    "lower back" (06/28/2016)  . Breast cancer ([place]) 07/16/2011   left breast lumpectomy=invasive ductal ca,2. cm,high grade ductal ca in situ,11 benign lymph nodes  . Chest pain, unspecified    Sharp midsternal chest pain at night with no radiation. Hard for her to take a deep breath in. NTG does help. Normal Myocardial perfusion study  05/14/11.  [name] Chronic combined systolic and diastolic CHF (congestive heart failure) ([place])   . Chronic lower back pain   . Chronic lower back pain   . Congestive heart failure ([place])   . Coronary artery disease    Negative cath 2001  . Daily headache    "just recently; I'm not used to having headaches" (06/28/2016)  . Depression   . Emphysema of lung ([place])   . GERD (gastroesophageal reflux disease)   . Heart murmur   . History of blood transfusion 2013   "w/cancer tx"  . History of bronchitis   . History of radiation therapy 09/06/11-11/24/11   left breast  60.4gray total dose  . Hyperlipidemia   . Hypertension   . Hypothyroidism   . Myocardial infarction ([place]) 2003  . Neuromuscular disorder (HCC)    tingling left hand  . Nonischemic cardiomyopathy (HCC)    LVEF 38%. MUGA, 2010, 50-55% by ECHO 04/14/11. Cardiologist Dr. [name]  . Obesity   . Other primary cardiomyopathies   . Shortness of breath 07/16/11   "at all times"  . Urticaria     Past Surgical History:  Procedure Laterality Date  . ABDOMINAL HYSTERECTOMY  ~2006   partial  . BREAST BIOPSY Left   . BREAST LUMPECTOMY Left 07/16/11   w/LND; left.ER/PR=positive  . CARDIAC CATHETERIZATION  2001   negative for CAD  . CATARACT EXTRACTION, BILATERAL Bilateral 10/17/2018   10/28/2018- Right 10/17/2018-left eye  . CESAREAN SECTION  1981; 1985; 1991  . PORT-A-CATH REMOVAL N/A 07/31/2012   Procedure: REMOVAL PORT-A-CATH;  Surgeon: Harl Bowie, MD;  Location: [facility];  Service: General;  Laterality: N/A;  . PORTACATH PLACEMENT Right 02/26/2011   Procedure: INSERTION PORT-A-CATH;  Surgeon: Harl Bowie, MD;  Location: [facility];  Service: General;  Laterality: Right;  . TUBAL LIGATION  1991    Social History   Socioeconomic History  . Marital status: Divorced    Spouse name: Not on file  . Number of children: 3  . Years of education: Not on file  . Highest education level: Not on file  Occupational History  .  Not on file  Social Needs  . Financial resource strain: Not on file  . Food insecurity    Worry: Not on file    Inability: Not on file  . Transportation needs    Medical: Not on file    Non-medical: Not on file  Tobacco Use  . Smoking status: Current Every Day Smoker    Packs/day: 0.13    Years: 30.00    Pack years: 3.90    Types: Cigarettes  . Smokeless tobacco: Never Used  Substance and Sexual Activity  . Alcohol use: Not Currently    Comment: 06/28/2016 "don't drink anything anymroe"  . Drug use: No  . Sexual activity: Not Currently  Lifestyle  . Physical activity    Days per week: Not on file    Minutes per session: Not on file  . Stress: Not on file  Relationships  . Social Herbalist on phone: Not on file    Gets together: Not on file    Attends religious service: Not on file    Active member of club or organization: Not on file    Attends meetings of clubs or organizations: Not on file    Relationship status: Not on file  . Intimate partner violence    Fear of current or ex partner: Not on file    Emotionally abused: Not on file    Physically abused: Not on file    Forced sexual activity: Not on file  Other Topics Concern  . Not on file  Social History Narrative  . Not on file    Review of Systems  Constitution: Positive for malaise/fatigue. Negative for decreased appetite, weight gain and weight loss.  Eyes: Negative for visual disturbance.  Cardiovascular: Positive for dyspnea on exertion. Negative for chest pain, claudication, leg swelling, orthopnea, palpitations and syncope.  Respiratory: Negative for hemoptysis and wheezing.   Endocrine: Negative for cold intolerance and heat intolerance.  Hematologic/Lymphatic: Does not bruise/bleed easily.  Skin: Negative for nail changes.  Musculoskeletal: Positive for back pain (chronic) and joint pain (neck, mid back, and right shoulder). Negative for muscle weakness and myalgias.  Gastrointestinal:  Negative for abdominal pain, change in bowel habit, nausea and vomiting.  Neurological: Negative for difficulty with concentration, dizziness, focal weakness and headaches.  Psychiatric/Behavioral: Positive for depression. Negative for altered mental status and suicidal ideas. The patient is nervous/anxious.   All other systems reviewed and are negative.     Objective:  Blood pressure (!) 144/81, pulse 77, temperature 98.4 F (36.9 C), height 4\' 9"  (1.448 m), weight 190 lb 12.8 oz (86.5 kg), SpO2 98 %. Body mass index is 41.29 kg/m.    Physical Exam  Constitutional: She is oriented to person, place, and time. Vital signs are normal. She appears well-developed and well-nourished.  Short statured, morbidly obese  HENT:  Head: Normocephalic and atraumatic.  Neck: Normal range of motion.  Cardiovascular: Normal rate, regular rhythm, normal heart sounds and intact distal pulses.  Pulses:      Femoral pulses are 1+ on the right side and 1+ on the left side.      Popliteal pulses are 1+ on the right side and 1+ on the left side.       Dorsalis pedis pulses are 1+ on the right side and 1+ on the left side.       Posterior tibial pulses are 1+ on the right side and 1+ on the left side.  No edema Pulses difficult to feel due to bodily habitus  Pulmonary/Chest: Effort normal and breath sounds normal. No accessory muscle usage. No respiratory distress.  Abdominal: Soft. Bowel sounds are normal.  Obese, pannus present  Musculoskeletal: Normal range of motion.  Neurological: She is alert and oriented to person, place, and time.  Skin: Skin is warm and dry.  Vitals reviewed.  Radiology: No results found.  Laboratory examination:    CMP Latest Ref Rng & Units 06/30/2016 06/29/2016 06/28/2016  Glucose 65 - 99 mg/dL 105(H) 105(H) 108(H)  BUN 6 - 20 mg/dL 13 14 17   Creatinine 0.44 - 1.00 mg/dL 1.02(H) 1.18(H) 1.26(H)  Sodium 135 - 145 mmol/L 137 138 136  Potassium 3.5 - 5.1 mmol/L 4.2 4.1 3.3(L)   Chloride 101 - 111 mmol/L 105 110 103  CO2 22 - 32 mmol/L 24 22 23   Calcium 8.9 - 10.3 mg/dL 9.2 8.6(L) 8.7(L)  Total Protein 6.5 - 8.1 g/dL - - 6.1(L)  Total Bilirubin 0.3 - 1.2 mg/dL - - 0.6  Alkaline Phos 38 - 126 U/L - - 58  AST 15 - 41 U/L - - 14(L)  ALT 14 - 54 U/L - - 18   CBC Latest Ref Rng & Units 06/30/2016 06/29/2016 06/28/2016  WBC 4.0 - 10.5 K/uL 5.8 9.6 11.6(H)  Hemoglobin 12.0 - 15.0 g/dL 11.9(L) 11.1(L) 12.1  Hematocrit 36.0 - 46.0 % 36.8 34.5(L) 36.7  Platelets 150 - 400 K/uL 145(L) 141(L) 145(L)   Lipid Panel  No results found for: CHOL, TRIG, HDL, CHOLHDL, VLDL, LDLCALC, LDLDIRECT HEMOGLOBIN A1C No results found for: HGBA1C, MPG TSH No results for input(s): TSH in the last 8760 hours.  PRN Meds:. Medications Discontinued During This Encounter  Medication Reason  . fexofenadine (ALLEGRA) 180 MG tablet Error  . ranitidine (ZANTAC) 300 MG tablet Patient Preference  . metoprolol (TOPROL-XL) 200 MG 24 hr tablet Reorder  . amLODipine (NORVASC) 5 MG tablet Reorder  . spironolactone (ALDACTONE) 50 MG tablet Reorder  . isosorbide mononitrate (IMDUR) 60 MG 24 hr tablet Reorder  . hydrALAZINE (APRESOLINE) 50 MG tablet Reorder  . nitroGLYCERIN (NITROSTAT) 0.4 MG SL tablet Reorder   Current Meds  Medication Sig  . albuterol (PROVENTIL HFA;VENTOLIN HFA) 108 (90 BASE) MCG/ACT inhaler Inhale 2 puffs into the lungs every 6 (six) hours as needed for wheezing or shortness of breath.  [name] amLODipine (NORVASC) 5 MG tablet Take 1 tablet (5 mg total) by mouth daily.  [name] anastrozole (ARIMIDEX) 1 MG tablet Take 1 tablet (1 mg total) by mouth daily.  . budesonide (RHINOCORT ALLERGY) 32 MCG/ACT nasal spray Place 1 spray into both nostrils daily as needed for rhinitis or allergies.   . budesonide-formoterol (SYMBICORT) 160-4.5 MCG/ACT inhaler Inhale two puffs twice daily as directed.  Rinse, gargle, and spit after use.  . diphenhydrAMINE (BENADRYL) 25 MG tablet Take 37.5 mg by  mouth daily  as needed for itching.  . diphenhydramine-acetaminophen (TYLENOL PM) 25-500 MG TABS Take 3 tablets by mouth at bedtime.   . fexofenadine (ALLEGRA) 180 MG tablet Take 1 tablet (180 mg total) by mouth daily.  . hydrALAZINE (APRESOLINE) 50 MG tablet Take 1 tablet (50 mg total) by mouth 3 (three) times daily.  . hydrocortisone cream 1 % Apply topically as needed for itching. Apply to affected area for itching  . isosorbide mononitrate (IMDUR) 60 MG 24 hr tablet Take 1 tablet (60 mg total) by mouth daily.  [name] levothyroxine (SYNTHROID, LEVOTHROID) 88 MCG tablet Take 88 mcg by mouth daily before breakfast.  . Melatonin 10 MG TABS Take 10 mg by mouth at bedtime as needed (restless legs).   . metoprolol (TOPROL-XL) 200 MG 24 hr tablet Take 1 tablet (200 mg total) by mouth daily.  . montelukast (SINGULAIR) 10 MG tablet Take 1 tablet (10 mg total) by mouth daily.  . nitroGLYCERIN (NITROSTAT) 0.4 MG SL tablet Place 1 tablet (0.4 mg total) under the tongue every 5 (five) minutes as needed for chest pain.  [name] omeprazole (PRILOSEC) 20 MG capsule Take 20 mg by mouth 2 (two) times daily.   [name] oxyCODONE-acetaminophen (PERCOCET) 7.5-325 MG tablet Take 1 tablet by mouth.  . polyethylene glycol (MIRALAX / GLYCOLAX) packet Take 17 g by mouth daily. Mix in 8 oz liquid and drink  . zolpidem (AMBIEN) 10 MG tablet Take 10 mg by mouth at bedtime.   . [DISCONTINUED] amLODipine (NORVASC) 5 MG tablet   . [DISCONTINUED] hydrALAZINE (APRESOLINE) 50 MG tablet Take 1 tablet (50 mg total) by mouth 3 (three) times daily.  . [DISCONTINUED] isosorbide mononitrate (IMDUR) 60 MG 24 hr tablet take 1 tablet by mouth daily  . [DISCONTINUED] metoprolol (TOPROL-XL) 200 MG 24 hr tablet Take 200 mg by mouth daily.  . [DISCONTINUED] nitroGLYCERIN (NITROSTAT) 0.4 MG SL tablet Place 0.4 mg under the tongue every 5 (five) minutes as needed for chest pain.     Cardiac Studies:    Echo- 06/25/2014 1. Left ventricle cavity is normal in size.  Borderline Normal global wall motion. Visual EF is 50-55%. Calculated EF 50%. 2. Left atrial cavity is mildly dilated. 3. Mild mitral regurgitation. 4. Mild tricuspid regurgitation. No evidence of pulmonary hypertension. 5. c.f. echo. of 06/20/2013, LVEF has improved from 30%.  Coronary Angiogram 08/07/2010: Normal coronary arteries, moderately depressed ejection fraction with a EF of 30-35% with global hypokinesis. Non isvchemic dilated cardiomyopathy.  Assessment:   Chronic diastolic heart failure (HCC) - Plan: EKG 12-Lead, PCV ECHOCARDIOGRAM COMPLETE  Essential hypertension - Plan: EKG 12-Lead  CKD (chronic kidney disease) stage 3, GFR 30-59 ml/min (HCC)  Morbid obesity (HCC)  Shortness of breath  Tobacco use disorder  EKG 11/15/2018: Normal sinus rhythm at 73 bpm, left atrial enlargement, normal axis, PRWP cannot exclude anterior infarct old. Nonspecific T wave abnormality.   Recommendations:   Patient is here for follow-up for chronic diastolic heart failure.  She is presently doing well without any new complaints.  Shortness of breath has remained stable.  She has been able to remain active with doing household chores that she tolerates well.  Blood pressure is generally well controlled, but slightly elevated today.  She monitors regularly at home.  We will continue with current medications.  She previously had severe LV dysfunction that improved with medications.  She has not had echocardiogram since 2016, I have recommended repeating this for surveillance.    She does have atypical  chest pain that is suggestive of GERD etiology. Encouraged her to discuss with her PCP. Can use Tums or rolaids as needed. I have refilled nitroglycerin for as needed use.   She has had labs with her PCP that she states were within normal limits.  She is on cholesterol medication, but is unsure of the correct medication and will notify this.  She has lost weight since last seen by [place] 1 year ago,  encouraged her to continue to work on this.  She had previously been given prescription for Chantix, but was unable to afford this.  She has been able to cut back to 6 cigarettes/day, congratulated her on this and provided positive reinforcement.  Encouraged her to continue to work on complete smoking cessation.  She is aware of long-term risk of continued smoking.  I will see her back in 6 months or sooner if problems.  Miquel Dunn, MSN, APRN, FNP-C [facility]. [place] Office: [phone] Fax: [phone]

## 2018-11-21 DIAGNOSIS — K219 Gastro-esophageal reflux disease without esophagitis: Secondary | ICD-10-CM | POA: Diagnosis not present

## 2018-11-21 DIAGNOSIS — I509 Heart failure, unspecified: Secondary | ICD-10-CM | POA: Diagnosis not present

## 2018-11-21 DIAGNOSIS — M179 Osteoarthritis of knee, unspecified: Secondary | ICD-10-CM | POA: Diagnosis not present

## 2018-11-21 DIAGNOSIS — D464 Refractory anemia, unspecified: Secondary | ICD-10-CM | POA: Diagnosis not present

## 2018-12-04 DIAGNOSIS — R197 Diarrhea, unspecified: Secondary | ICD-10-CM | POA: Diagnosis not present

## 2018-12-19 DIAGNOSIS — I509 Heart failure, unspecified: Secondary | ICD-10-CM | POA: Diagnosis not present

## 2018-12-19 DIAGNOSIS — D464 Refractory anemia, unspecified: Secondary | ICD-10-CM | POA: Diagnosis not present

## 2018-12-19 DIAGNOSIS — K219 Gastro-esophageal reflux disease without esophagitis: Secondary | ICD-10-CM | POA: Diagnosis not present

## 2018-12-19 DIAGNOSIS — T7840XA Allergy, unspecified, initial encounter: Secondary | ICD-10-CM | POA: Diagnosis not present

## 2018-12-25 DIAGNOSIS — K219 Gastro-esophageal reflux disease without esophagitis: Secondary | ICD-10-CM | POA: Diagnosis not present

## 2018-12-25 DIAGNOSIS — D464 Refractory anemia, unspecified: Secondary | ICD-10-CM | POA: Diagnosis not present

## 2018-12-25 DIAGNOSIS — T7840XA Allergy, unspecified, initial encounter: Secondary | ICD-10-CM | POA: Diagnosis not present

## 2018-12-25 DIAGNOSIS — I509 Heart failure, unspecified: Secondary | ICD-10-CM | POA: Diagnosis not present

## 2019-02-05 DIAGNOSIS — I10 Essential (primary) hypertension: Secondary | ICD-10-CM | POA: Diagnosis not present

## 2019-02-05 DIAGNOSIS — C50919 Malignant neoplasm of unspecified site of unspecified female breast: Secondary | ICD-10-CM | POA: Diagnosis not present

## 2019-02-05 DIAGNOSIS — D464 Refractory anemia, unspecified: Secondary | ICD-10-CM | POA: Diagnosis not present

## 2019-02-05 DIAGNOSIS — I509 Heart failure, unspecified: Secondary | ICD-10-CM | POA: Diagnosis not present

## 2019-02-05 DIAGNOSIS — K219 Gastro-esophageal reflux disease without esophagitis: Secondary | ICD-10-CM | POA: Diagnosis not present

## 2019-04-10 ENCOUNTER — Other Ambulatory Visit: Payer: Self-pay | Admitting: Cardiology

## 2019-05-02 DIAGNOSIS — I509 Heart failure, unspecified: Secondary | ICD-10-CM | POA: Diagnosis not present

## 2019-05-02 DIAGNOSIS — Z20822 Contact with and (suspected) exposure to covid-19: Secondary | ICD-10-CM | POA: Diagnosis not present

## 2019-05-02 DIAGNOSIS — K219 Gastro-esophageal reflux disease without esophagitis: Secondary | ICD-10-CM | POA: Diagnosis not present

## 2019-05-02 DIAGNOSIS — D464 Refractory anemia, unspecified: Secondary | ICD-10-CM | POA: Diagnosis not present

## 2019-05-09 ENCOUNTER — Other Ambulatory Visit: Payer: Medicare Other

## 2019-05-14 ENCOUNTER — Other Ambulatory Visit: Payer: Self-pay | Admitting: Cardiology

## 2019-05-16 ENCOUNTER — Other Ambulatory Visit: Payer: Self-pay

## 2019-05-16 ENCOUNTER — Ambulatory Visit: Payer: Medicare Other | Admitting: Cardiology

## 2019-05-16 MED ORDER — HYDRALAZINE HCL 50 MG PO TABS: 50.0000 mg | ORAL_TABLET | Freq: Three times a day (TID) | ORAL | 1 refills | Status: DC

## 2019-05-16 NOTE — Progress Notes (Deleted)
Primary Physician:  Cher Nakai, MD   Patient ID: [name], female    DOB: 09/07/1960, 59 y.o.   MRN: [id number]  Subjective:    No chief complaint on file.   HPI: [name]  is a 59 y.o. female  with history of left breast cancer status post lumpectomy and received radiation and chemotherapy 2012 and had developed nonischemic dilated cardiomyopathy with severe LV systolic dysfunction and normal coronary arteries at that time. With aggressive medical therapy her ejection fraction improved in 2015. She also has hypertension,  hyperlipidemia, stage III chronic kidney disease, hyperlipidemia, morbid obesity, tobacco use disorder. She was last seen in march 2019 and now presents for CHF follow up.  She is presently doing well without any significant complaints today. She has dyspnea on exertion that is unchanged. States that she is able to do her normal household chores without any difficulty. She does have some occasional episodes of chest discomfort after eating, has history of GERD. She is requesting refill on nitroglycerin. No worsening leg edema, no PND or orthopnea. She has been working to lose weight. States labs with her PCP are stable.   She was given prescription for Chantix at her last appointment; however, was unable to afford this. She does report that she has been able to cut back to 6 cigarettes per day.   Past Medical History:  Diagnosis Date  . Anemia 05/19/2011   "w/cancer tx"  . Animal dander allergy    "cats and dogs"  . Anxiety   . Arthritis    "lower back" (06/28/2016)  . Breast cancer ([place]) 07/16/2011   left breast lumpectomy=invasive ductal ca,2. cm,high grade ductal ca in situ,11 benign lymph nodes  . Chest pain, unspecified    Sharp midsternal chest pain at night with no radiation. Hard for her to take a deep breath in. NTG does help. Normal Myocardial perfusion study 05/14/11.  [name] Chronic combined systolic and diastolic CHF (congestive heart failure) ([place])   .  Chronic lower back pain   . Chronic lower back pain   . Congestive heart failure ([place])   . Coronary artery disease    Negative cath 2001  . Daily headache    "just recently; I'm not used to having headaches" (06/28/2016)  . Depression   . Emphysema of lung ([place])   . GERD (gastroesophageal reflux disease)   . Heart murmur   . History of blood transfusion 2013   "w/cancer tx"  . History of bronchitis   . History of radiation therapy 09/06/11-11/24/11   left breast  60.4gray total dose  . Hyperlipidemia   . Hypertension   . Hypothyroidism   . Myocardial infarction ([place]) 2003  . Neuromuscular disorder ([facility])    tingling left hand  . Nonischemic cardiomyopathy ([facility])    LVEF 38%. MUGA, 2010, 50-55% by ECHO 04/14/11. Cardiologist Dr. [name]  . Obesity   . Other primary cardiomyopathies   . Shortness of breath 07/16/11   "at all times"  . Urticaria     Past Surgical History:  Procedure Laterality Date  . ABDOMINAL HYSTERECTOMY  ~2006   partial  . BREAST BIOPSY Left   . BREAST LUMPECTOMY Left 07/16/11   w/LND; left.ER/PR=positive  . CARDIAC CATHETERIZATION  2001   negative for CAD  . CATARACT EXTRACTION, BILATERAL Bilateral 10/17/2018   10/28/2018- Right 10/17/2018-left eye  . [place]; 1985; 1991  . PORT-A-CATH REMOVAL N/A 07/31/2012   Procedure: REMOVAL PORT-A-CATH;  Surgeon:  Harl Bowie, MD;  Location: [facility];  Service: General;  Laterality: N/A;  . PORTACATH PLACEMENT Right 02/26/2011   Procedure: INSERTION PORT-A-CATH;  Surgeon: Harl Bowie, MD;  Location: [facility];  Service: General;  Laterality: Right;  . TUBAL LIGATION  1991    Social History   Socioeconomic History  . Marital status: Divorced    Spouse name: Not on file  . Number of children: 3  . Years of education: Not on file  . Highest education level: Not on file  Occupational History  . Not on file  Tobacco Use  . Smoking status: Current Every Day Smoker    Packs/day: 0.13     Years: 30.00    Pack years: 3.90    Types: Cigarettes  . Smokeless tobacco: Never Used  Substance and Sexual Activity  . Alcohol use: Not Currently    Comment: 06/28/2016 "don't drink anything anymroe"  . Drug use: No  . Sexual activity: Not Currently  Other Topics Concern  . Not on file  Social History Narrative  . Not on file   Social Determinants of Health   Financial Resource Strain:   . Difficulty of Paying Living Expenses: Not on file  Food Insecurity:   . Worried About Charity fundraiser in the Last Year: Not on file  . Ran Out of Food in the Last Year: Not on file  Transportation Needs:   . Lack of Transportation (Medical): Not on file  . Lack of Transportation (Non-Medical): Not on file  Physical Activity:   . Days of Exercise per Week: Not on file  . Minutes of Exercise per Session: Not on file  Stress:   . Feeling of Stress : Not on file  Social Connections:   . Frequency of Communication with Friends and Family: Not on file  . Frequency of Social Gatherings with Friends and Family: Not on file  . Attends Religious Services: Not on file  . Active Member of Clubs or Organizations: Not on file  . Attends Archivist Meetings: Not on file  . Marital Status: Not on file  Intimate Partner Violence:   . Fear of Current or Ex-Partner: Not on file  . Emotionally Abused: Not on file  . Physically Abused: Not on file  . Sexually Abused: Not on file    Review of Systems  Constitution: Positive for malaise/fatigue. Negative for decreased appetite, weight gain and weight loss.  Eyes: Negative for visual disturbance.  Cardiovascular: Positive for dyspnea on exertion. Negative for chest pain, claudication, leg swelling, orthopnea, palpitations and syncope.  Respiratory: Negative for hemoptysis and wheezing.   Endocrine: Negative for cold intolerance and heat intolerance.  Hematologic/Lymphatic: Does not bruise/bleed easily.  Skin: Negative for nail changes.    Musculoskeletal: Positive for back pain (chronic) and joint pain (neck, mid back, and right shoulder). Negative for muscle weakness and myalgias.  Gastrointestinal: Negative for abdominal pain, change in bowel habit, nausea and vomiting.  Neurological: Negative for difficulty with concentration, dizziness, focal weakness and headaches.  Psychiatric/Behavioral: Positive for depression. Negative for altered mental status and suicidal ideas. The patient is nervous/anxious.   All other systems reviewed and are negative.     Objective:  There were no vitals taken for this visit. There is no height or weight on file to calculate BMI.    Physical Exam  Constitutional: She is oriented to person, place, and time. Vital signs are normal. She appears well-developed and well-nourished.  Short statured,  morbidly obese  HENT:  Head: Normocephalic and atraumatic.  Cardiovascular: Normal rate, regular rhythm, normal heart sounds and intact distal pulses.  Pulses:      Femoral pulses are 1+ on the right side and 1+ on the left side.      Popliteal pulses are 1+ on the right side and 1+ on the left side.       Dorsalis pedis pulses are 1+ on the right side and 1+ on the left side.       Posterior tibial pulses are 1+ on the right side and 1+ on the left side.  No edema Pulses difficult to feel due to bodily habitus  Pulmonary/Chest: Effort normal and breath sounds normal. No accessory muscle usage. No respiratory distress.  Abdominal: Soft. Bowel sounds are normal.  Obese, pannus present  Musculoskeletal:        General: Normal range of motion.     Cervical back: Normal range of motion.  Neurological: She is alert and oriented to person, place, and time.  Skin: Skin is warm and dry.  Vitals reviewed.  Radiology: No results found.  Laboratory examination:    CMP Latest Ref Rng & Units 06/30/2016 06/29/2016 06/28/2016  Glucose 65 - 99 mg/dL 105(H) 105(H) 108(H)  BUN 6 - 20 mg/dL 13 14 17    Creatinine 0.44 - 1.00 mg/dL 1.02(H) 1.18(H) 1.26(H)  Sodium 135 - 145 mmol/L 137 138 136  Potassium 3.5 - 5.1 mmol/L 4.2 4.1 3.3(L)  Chloride 101 - 111 mmol/L 105 110 103  CO2 22 - 32 mmol/L 24 22 23   Calcium 8.9 - 10.3 mg/dL 9.2 8.6(L) 8.7(L)  Total Protein 6.5 - 8.1 g/dL - - 6.1(L)  Total Bilirubin 0.3 - 1.2 mg/dL - - 0.6  Alkaline Phos 38 - 126 U/L - - 58  AST 15 - 41 U/L - - 14(L)  ALT 14 - 54 U/L - - 18   CBC Latest Ref Rng & Units 06/30/2016 06/29/2016 06/28/2016  WBC 4.0 - 10.5 K/uL 5.8 9.6 11.6(H)  Hemoglobin 12.0 - 15.0 g/dL 11.9(L) 11.1(L) 12.1  Hematocrit 36.0 - 46.0 % 36.8 34.5(L) 36.7  Platelets 150 - 400 K/uL 145(L) 141(L) 145(L)   Lipid Panel  No results found for: CHOL, TRIG, HDL, CHOLHDL, VLDL, LDLCALC, LDLDIRECT HEMOGLOBIN A1C No results found for: HGBA1C, MPG TSH No results for input(s): TSH in the last 8760 hours.  PRN Meds:. There are no discontinued medications. No outpatient medications have been marked as taking for the 05/16/19 encounter (Appointment) with Miquel Dunn, NP.    Cardiac Studies:    Echo- 06/25/2014 1. Left ventricle cavity is normal in size. Borderline Normal global wall motion. Visual EF is 50-55%. Calculated EF 50%. 2. Left atrial cavity is mildly dilated. 3. Mild mitral regurgitation. 4. Mild tricuspid regurgitation. No evidence of pulmonary hypertension. 5. c.f. echo. of 06/20/2013, LVEF has improved from 30%.  Coronary Angiogram 08/07/2010: Normal coronary arteries, moderately depressed ejection fraction with a EF of 30-35% with global hypokinesis. Non isvchemic dilated cardiomyopathy.  Assessment:   No diagnosis found.  EKG 11/15/2018: Normal sinus rhythm at 73 bpm, left atrial enlargement, normal axis, PRWP cannot exclude anterior infarct old. Nonspecific T wave abnormality.   Recommendations:   Patient is here for follow-up for chronic diastolic heart failure.  She is presently doing well without any new complaints.   Shortness of breath has remained stable.  She has been able to remain active with doing household chores that she tolerates well.  Blood pressure is generally well controlled, but slightly elevated today.  She monitors regularly at home.  We will continue with current medications.  She previously had severe LV dysfunction that improved with medications.  She has not had echocardiogram since 2016, I have recommended repeating this for surveillance.    She does have atypical chest pain that is suggestive of GERD etiology. Encouraged her to discuss with her PCP. Can use Tums or rolaids as needed. I have refilled nitroglycerin for as needed use.   She has had labs with her PCP that she states were within normal limits.  She is on cholesterol medication, but is unsure of the correct medication and will notify this.  She has lost weight since last seen by [place] 1 year ago, encouraged her to continue to work on this.  She had previously been given prescription for Chantix, but was unable to afford this.  She has been able to cut back to 6 cigarettes/day, congratulated her on this and provided positive reinforcement.  Encouraged her to continue to work on complete smoking cessation.  She is aware of long-term risk of continued smoking.  I will see her back in 6 months or sooner if problems.  Miquel Dunn, MSN, APRN, FNP-C [facility]. [place] Office: [phone] Fax: [phone]

## 2019-05-24 ENCOUNTER — Ambulatory Visit: Payer: Medicare Other

## 2019-05-24 ENCOUNTER — Other Ambulatory Visit: Payer: Self-pay

## 2019-05-24 DIAGNOSIS — I5032 Chronic diastolic (congestive) heart failure: Secondary | ICD-10-CM

## 2019-05-31 ENCOUNTER — Other Ambulatory Visit: Payer: Self-pay

## 2019-05-31 ENCOUNTER — Encounter: Payer: Self-pay | Admitting: Cardiology

## 2019-05-31 ENCOUNTER — Telehealth: Payer: Medicare Other | Admitting: Cardiology

## 2019-05-31 VITALS — Ht <= 58 in | Wt 184.0 lb

## 2019-05-31 DIAGNOSIS — E782 Mixed hyperlipidemia: Secondary | ICD-10-CM | POA: Diagnosis not present

## 2019-05-31 DIAGNOSIS — I420 Dilated cardiomyopathy: Secondary | ICD-10-CM

## 2019-05-31 DIAGNOSIS — F17200 Nicotine dependence, unspecified, uncomplicated: Secondary | ICD-10-CM | POA: Diagnosis not present

## 2019-05-31 DIAGNOSIS — Z720 Tobacco use: Secondary | ICD-10-CM

## 2019-05-31 DIAGNOSIS — Z712 Person consulting for explanation of examination or test findings: Secondary | ICD-10-CM | POA: Diagnosis not present

## 2019-05-31 DIAGNOSIS — I5042 Chronic combined systolic (congestive) and diastolic (congestive) heart failure: Secondary | ICD-10-CM | POA: Diagnosis not present

## 2019-05-31 DIAGNOSIS — R0600 Dyspnea, unspecified: Secondary | ICD-10-CM | POA: Diagnosis not present

## 2019-05-31 DIAGNOSIS — I10 Essential (primary) hypertension: Secondary | ICD-10-CM

## 2019-05-31 DIAGNOSIS — R0609 Other forms of dyspnea: Secondary | ICD-10-CM

## 2019-05-31 DIAGNOSIS — Z6841 Body Mass Index (BMI) 40.0 and over, adult: Secondary | ICD-10-CM | POA: Diagnosis not present

## 2019-05-31 MED ORDER — FUROSEMIDE 20 MG PO TABS: 20.0000 mg | ORAL_TABLET | Freq: Every morning | ORAL | 3 refills | Status: DC

## 2019-05-31 MED ORDER — HYDRALAZINE HCL 50 MG PO TABS: 50.0000 mg | ORAL_TABLET | Freq: Three times a day (TID) | ORAL | 1 refills | Status: DC

## 2019-05-31 MED ORDER — AMLODIPINE BESYLATE 5 MG PO TABS: 5.0000 mg | ORAL_TABLET | Freq: Every day | ORAL | 3 refills | Status: DC

## 2019-05-31 MED ORDER — ROSUVASTATIN CALCIUM 10 MG PO TABS: 10.0000 mg | ORAL_TABLET | Freq: Every day | ORAL | 2 refills | Status: DC

## 2019-05-31 NOTE — Progress Notes (Signed)
Primary Physician:  Cher Nakai, MD   Patient ID: [name], female    DOB: 1960-12-12, 59 y.o.   MRN: [id number]  Subjective:   Virtual Visit via Telephone Note: Patient unable to use video assisted device.  This visit type was conducted due to national recommendations for restrictions regarding the COVID-19 Pandemic (e.g. social distancing).  This format is felt to be most appropriate for this patient at this time.  All issues noted in this document were discussed and addressed.  No physical exam was performed.  The patient has consented to conduct a Telehealth visit and understands insurance will be billed.   I connected with@, on 05/31/19 at [place] and verified that I am speaking with the correct person using two identifiers.   I discussed the limitations of evaluation and management by telemedicine and the availability of in person appointments. The patient expressed understanding and agreed to proceed.   I have discussed with patient regarding the safety during COVID Pandemic and steps and precautions to be taken including social distancing, frequent hand wash and use of detergent soap, gels with the patient. I asked the patient to avoid touching mouth, nose, eyes, ears with the hands. I encouraged regular walking around the neighborhood and exercise and regular diet, as long as social distancing can be maintained.   Chief Complaint  Patient presents with  . Shortness of Breath  . Results    HPI: [name]  is a 59 y.o. 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 With aggressive medical therapy her ejection fraction improved in 2015. She also has  hypertension,  hyperlipidemia, stage III chronic kidney disease, hyperlipidemia, morbid obesity, tobacco use disorder.    Shortness of breath: Patient states that she continues to have shortness of breath most predominant with effort related activities.  This is a chronic condition for her and at times she does have flareups.  Patient states that the shortness of breath has been more noticeable over the last 2 months she chronically uses 3 pillows at night no worsening of orthopnea, paroxysmal nocturnal dyspnea or lower extremity swelling.  Patient denies any excessive weight gain.  She states that she is compliant with dietary recommendations.  No chest pain at rest or with effort related activities.  She continues to smoke but has decreased her cigarette use by approximately 1/day.  She states that she is currently using the nicotine patch as well.  Patient had an echocardiogram since last office visit and her LVEF is 45 to 50% as compared to her prior study which was 50-55%.  We went through her medications extensively over the phone to make sure that she is taking guideline directed medical therapy.  Difficult to uptitrate medical therapy as I do not have most recent vital signs to review or evaluate.  Patient is requesting refills on multiple medications as noted below.  Past Medical History:  Diagnosis Date  . Anemia 05/19/2011   "w/cancer tx"  . Animal dander allergy    "cats and dogs"  . Anxiety   . Arthritis    "lower back" (06/28/2016)  . Breast cancer ([facility]) 07/16/2011   left breast lumpectomy=invasive ductal ca,2. cm,high grade ductal ca in situ,11 benign  lymph nodes  . Chest pain, unspecified    Sharp midsternal chest pain at night with no radiation. Hard for her to take a deep breath in. NTG does help. Normal Myocardial perfusion study 05/14/11.  [name] Chronic combined systolic and diastolic CHF (congestive heart failure) ([place])   . Chronic lower back pain   . Chronic lower back pain   .  Congestive heart failure ([facility])   . Coronary artery disease    Negative cath 2001  . Daily headache    "just recently; I'm not used to having headaches" (06/28/2016)  . Depression   . Emphysema of lung ([facility])   . GERD (gastroesophageal reflux disease)   . Heart murmur   . History of blood transfusion 2013   "w/cancer tx"  . History of bronchitis   . History of radiation therapy 09/06/11-11/24/11   left breast  60.4gray total dose  . Hyperlipidemia   . Hypertension   . Hypothyroidism   . Myocardial infarction ([place]) 2003  . Neuromuscular disorder (HCC)    tingling left hand  . Nonischemic cardiomyopathy (HCC)    LVEF 38%. MUGA, 2010, 50-55% by ECHO 04/14/11. Cardiologist Dr. [name]  . Obesity   . Other primary cardiomyopathies   . Shortness of breath 07/16/11   "at all times"  . Urticaria     Past Surgical History:  Procedure Laterality Date  . ABDOMINAL HYSTERECTOMY  ~2006   partial  . BREAST BIOPSY Left   . BREAST LUMPECTOMY Left 07/16/11   w/LND; left.ER/PR=positive  . CARDIAC CATHETERIZATION  2001   negative for CAD  . CATARACT EXTRACTION, BILATERAL Bilateral 10/17/2018   10/28/2018- Right 10/17/2018-left eye  . [place]; 1985; 1991  . PORT-A-CATH REMOVAL N/A 07/31/2012   Procedure: REMOVAL PORT-A-CATH;  Surgeon: Harl Bowie, MD;  Location: [facility];  Service: General;  Laterality: N/A;  . PORTACATH PLACEMENT Right 02/26/2011   Procedure: INSERTION PORT-A-CATH;  Surgeon: Harl Bowie, MD;  Location: [facility];  Service: General;  Laterality: Right;  . TUBAL LIGATION  1991    Social History   Socioeconomic History  . Marital status: Divorced    Spouse name: Not on file  . Number of children: 3  . Years of education: Not on file  . Highest education level: Not on file  Occupational History  . Not on file  Tobacco Use  . Smoking status: Current Every Day Smoker    Packs/day: 0.13    Years: 30.00    Pack years: 3.90    Types: Cigarettes  .  Smokeless tobacco: Never Used  Substance and Sexual Activity  . Alcohol use: Not Currently    Comment: 06/28/2016 "don't drink anything anymroe"  . Drug use: No  . Sexual activity: Not Currently  Other Topics Concern  . Not on file  Social History Narrative  . Not on file   Social Determinants of Health   Financial Resource Strain:   . Difficulty of Paying Living Expenses:   Food Insecurity:   . Worried About Charity fundraiser in the Last Year:   . Arboriculturist in the Last Year:   Transportation Needs:   . Film/video editor (Medical):   [name] Lack of Transportation (Non-Medical):   Physical Activity:   . Days of Exercise per Week:   . Minutes of Exercise per Session:   Stress:   . Feeling of Stress :   Social Connections:   . Frequency of Communication with  Friends and Family:   . Frequency of Social Gatherings with Friends and Family:   . Attends Religious Services:   . Active Member of Clubs or Organizations:   . Attends Archivist Meetings:   [name] Marital Status:   Intimate Partner Violence:   . Fear of Current or Ex-Partner:   . Emotionally Abused:   [name] Physically Abused:   . Sexually Abused:     Review of Systems  Constitution: Positive for malaise/fatigue. Negative for decreased appetite, weight gain and weight loss.  Eyes: Negative for visual disturbance.  Cardiovascular: Positive for dyspnea on exertion. Negative for chest pain, claudication, leg swelling, orthopnea, palpitations and syncope.  Respiratory: Negative for hemoptysis and wheezing.   Endocrine: Negative for cold intolerance and heat intolerance.  Hematologic/Lymphatic: Does not bruise/bleed easily.  Skin: Negative for nail changes.  Musculoskeletal: Positive for back pain (chronic) and joint pain (neck, mid back, and right shoulder). Negative for muscle weakness and myalgias.  Gastrointestinal: Negative for abdominal pain, change in bowel habit, nausea and vomiting.  Neurological:  Negative for difficulty with concentration, dizziness, focal weakness and headaches.  Psychiatric/Behavioral: Negative for altered mental status and suicidal ideas.  All other systems reviewed and are negative.     Objective:  Height 4\' 9"  (1.448 m), weight 184 lb (83.5 kg). Body mass index is 39.82 kg/m. This is a telephone encounter.  Physical examination was not performed during this encounter.  Radiology: No results found.  Laboratory examination:    CMP Latest Ref Rng & Units 06/30/2016 06/29/2016 06/28/2016  Glucose 65 - 99 mg/dL 105(H) 105(H) 108(H)  BUN 6 - 20 mg/dL 13 14 17   Creatinine 0.44 - 1.00 mg/dL 1.02(H) 1.18(H) 1.26(H)  Sodium 135 - 145 mmol/L 137 138 136  Potassium 3.5 - 5.1 mmol/L 4.2 4.1 3.3(L)  Chloride 101 - 111 mmol/L 105 110 103  CO2 22 - 32 mmol/L 24 22 23   Calcium 8.9 - 10.3 mg/dL 9.2 8.6(L) 8.7(L)  Total Protein 6.5 - 8.1 g/dL - - 6.1(L)  Total Bilirubin 0.3 - 1.2 mg/dL - - 0.6  Alkaline Phos 38 - 126 U/L - - 58  AST 15 - 41 U/L - - 14(L)  ALT 14 - 54 U/L - - 18   CBC Latest Ref Rng & Units 06/30/2016 06/29/2016 06/28/2016  WBC 4.0 - 10.5 K/uL 5.8 9.6 11.6(H)  Hemoglobin 12.0 - 15.0 g/dL 11.9(L) 11.1(L) 12.1  Hematocrit 36.0 - 46.0 % 36.8 34.5(L) 36.7  Platelets 150 - 400 K/uL 145(L) 141(L) 145(L)   Lipid Panel  01/2019: Total cholesterol 256, LDL 181, HDL 81, triglycerides 76.   Medications Discontinued During This Encounter  Medication Reason  . amLODipine (NORVASC) 5 MG tablet Reorder  . hydrALAZINE (APRESOLINE) 50 MG tablet Reorder  . rosuvastatin (CRESTOR) 10 MG tablet Reorder  . albuterol (PROVENTIL HFA;VENTOLIN HFA) 108 (90 BASE) MCG/ACT inhaler Patient Discharge  . hydrocortisone cream 1 % Patient Discharge  . isosorbide mononitrate (IMDUR) 60 MG 24 hr tablet Patient Discharge   Current Meds  Medication Sig  . amLODipine (NORVASC) 5 MG tablet Take 1 tablet (5 mg total) by mouth daily.  [name] anastrozole (ARIMIDEX) 1 MG tablet Take 1 tablet  (1 mg total) by mouth daily.  . budesonide (RHINOCORT ALLERGY) 32 MCG/ACT nasal spray Place 1 spray into both nostrils daily as needed for rhinitis or allergies.   . budesonide-formoterol (SYMBICORT) 160-4.5 MCG/ACT inhaler Inhale two puffs twice daily as directed.  Rinse, gargle, and spit after use.  [name]  diphenhydrAMINE (BENADRYL) 25 MG tablet Take 37.5 mg by mouth daily as needed for itching.  . diphenhydramine-acetaminophen (TYLENOL PM) 25-500 MG TABS Take 3 tablets by mouth at bedtime.   . fexofenadine (ALLEGRA) 180 MG tablet Take 1 tablet (180 mg total) by mouth daily.  . hydrALAZINE (APRESOLINE) 50 MG tablet Take 1 tablet (50 mg total) by mouth 3 (three) times daily.  . isosorbide mononitrate (IMDUR) 60 MG 24 hr tablet Take 1 tablet (60 mg total) by mouth daily.  [name] levothyroxine (SYNTHROID, LEVOTHROID) 88 MCG tablet Take 88 mcg by mouth daily before breakfast.  . Melatonin 10 MG TABS Take 10 mg by mouth at bedtime as needed (restless legs).   . metoprolol (TOPROL-XL) 200 MG 24 hr tablet Take 1 tablet (200 mg total) by mouth daily.  . montelukast (SINGULAIR) 10 MG tablet Take 1 tablet (10 mg total) by mouth daily.  . nitroGLYCERIN (NITROSTAT) 0.4 MG SL tablet Place 1 tablet (0.4 mg total) under the tongue every 5 (five) minutes as needed for chest pain.  [name] omeprazole (PRILOSEC) 20 MG capsule Take 20 mg by mouth 2 (two) times daily.   [name] oxyCODONE-acetaminophen (PERCOCET) 7.5-325 MG tablet Take 1 tablet by mouth.  . polyethylene glycol (MIRALAX / GLYCOLAX) packet Take 17 g by mouth daily. Mix in 8 oz liquid and drink  . rosuvastatin (CRESTOR) 10 MG tablet Take 1 tablet (10 mg total) by mouth at bedtime.  [name] spironolactone (ALDACTONE) 50 MG tablet Take 1 tablet (50 mg total) by mouth every morning.  . zolpidem (AMBIEN) 10 MG tablet Take 10 mg by mouth at bedtime.   . [DISCONTINUED] amLODipine (NORVASC) 5 MG tablet Take 1 tablet (5 mg total) by mouth daily.  . [DISCONTINUED] hydrALAZINE  (APRESOLINE) 50 MG tablet Take 1 tablet (50 mg total) by mouth 3 (three) times daily.  . [DISCONTINUED] isosorbide mononitrate (IMDUR) 60 MG 24 hr tablet Take 60 mg by mouth daily.  . [DISCONTINUED] rosuvastatin (CRESTOR) 10 MG tablet Take 10 mg by mouth at bedtime.     Cardiac Studies:   EKG 11/15/2018: Normal sinus rhythm at 73 bpm, left atrial enlargement, normal axis, PRWP cannot exclude anterior infarct old. Nonspecific T wave abnormality.    Echo: 06/25/2014: LVEF 50-55%, mildly dilated left atrium, mild MR, mild TR.  05/24/2019: LVEF 45-50%, mild global hypokinesis, grade 1 diastolic impairment, normal left atrial pressure, left atrium mildly dilated, mild MR.  Coronary Angiogram 08/07/2010: Normal coronary arteries, moderately depressed ejection fraction with a EF of 30-35% with global hypokinesis. Non isvchemic dilated cardiomyopathy.  Assessment:     ICD-10-CM   1. Chronic combined systolic and diastolic heart failure (HCC)  I50.42 Pro b natriuretic peptide (BNP)[id number])    Basic Metabolic Panel (BMET)    Magnesium    hydrALAZINE (APRESOLINE) 50 MG tablet    amLODipine (NORVASC) 5 MG tablet    furosemide (LASIX) 20 MG tablet  2. Nonischemic dilated cardiomyopathy (HCC)  I42.0   3. Essential hypertension  I10   4. Dyspnea on exertion  R06.00 furosemide (LASIX) 20 MG tablet  5. Tobacco use disorder  F17.200   6. Class 3 severe obesity due to excess calories with serious comorbidity and body mass index (BMI) of 45.0 to 49.9 in adult (HCC)  E66.01    Z68.42   7. Mixed hyperlipidemia  E78.2 rosuvastatin (CRESTOR) 10 MG tablet    Lipid Panel With LDL/HDL Ratio  8. Tobacco abuse  Z72.0   9. Encounter to discuss  test results  Z71.2    Recommendations:   Chronic combined systolic and diastolic heart failure:  Ejection Fraction noted on last 2D Echo  Recommend daily weight check, strict I/O's  Fluid restriction to <2L per day, Na restriction < 2g per  day  Continue guideline directed medical therapy with beta blocker, hydralazine / imdur, aldosterone antagonist, will consider ARNI after knowing the kidney function.   Add lasix 20mg  po qAM.   Nonischemic cardiomyopathy: See above  Dyspnea on exertion, acute on chronic:  We will add hydralazine scratch that we will add Lasix 20 mg p.o. every morning.  Check BMP and magnesium in 1 week.  Check NT proBNP.  Medications reconciled.  Benign essential hypertension:  Continue current medical therapy.  Refill hydralazine.  Refill amlodipine. . Medication reconciled.  . If the blood pressure is consistently greater than 145mmHg patient is asked to call the office to for medication titration sooner than the next office visit.  . Low salt diet recommended. A diet that is rich in fruits, vegetables, legumes, and low-fat dairy products and low in snacks, sweets, and meats (such as the Dietary Approaches to Stop Hypertension [DASH] diet).   Mixed hyperlipidemia: LDL currently not at goal.  Continue statin therapy.   Check fasting lipid profile.  Tobacco use:  At last office visit patient was approximately consuming 6 cigarettes/day and now she is down to 1/day.  Patient also is using anything patches as needed basis.  Education importance of complete smoking cessation.  Rex Kras, DO, [facility]. [place] Office: [phone]

## 2019-06-02 DIAGNOSIS — S299XXA Unspecified injury of thorax, initial encounter: Secondary | ICD-10-CM | POA: Diagnosis not present

## 2019-06-02 DIAGNOSIS — W11XXXA Fall on and from ladder, initial encounter: Secondary | ICD-10-CM | POA: Diagnosis not present

## 2019-06-27 DIAGNOSIS — I10 Essential (primary) hypertension: Secondary | ICD-10-CM | POA: Diagnosis not present

## 2019-08-22 DIAGNOSIS — K0889 Other specified disorders of teeth and supporting structures: Secondary | ICD-10-CM | POA: Diagnosis not present

## 2019-08-23 ENCOUNTER — Telehealth: Payer: Self-pay | Admitting: Oncology

## 2019-08-23 NOTE — Telephone Encounter (Signed)
[facility] at [phone] that no information for requested DOS were found

## 2019-08-27 ENCOUNTER — Other Ambulatory Visit: Payer: Self-pay

## 2019-08-27 DIAGNOSIS — R0609 Other forms of dyspnea: Secondary | ICD-10-CM

## 2019-08-27 DIAGNOSIS — I5042 Chronic combined systolic (congestive) and diastolic (congestive) heart failure: Secondary | ICD-10-CM

## 2019-08-27 DIAGNOSIS — K0889 Other specified disorders of teeth and supporting structures: Secondary | ICD-10-CM | POA: Diagnosis not present

## 2019-08-27 DIAGNOSIS — E782 Mixed hyperlipidemia: Secondary | ICD-10-CM

## 2019-09-02 MED ORDER — HYDRALAZINE HCL 50 MG PO TABS: 50.0000 mg | ORAL_TABLET | Freq: Three times a day (TID) | ORAL | 1 refills | Status: DC

## 2019-09-02 MED ORDER — ROSUVASTATIN CALCIUM 10 MG PO TABS: 10.0000 mg | ORAL_TABLET | Freq: Every day | ORAL | 2 refills | Status: DC

## 2019-09-02 MED ORDER — FUROSEMIDE 20 MG PO TABS: 20.0000 mg | ORAL_TABLET | Freq: Every morning | ORAL | 2 refills | Status: DC

## 2019-10-08 DIAGNOSIS — I10 Essential (primary) hypertension: Secondary | ICD-10-CM | POA: Diagnosis not present

## 2019-12-01 ENCOUNTER — Other Ambulatory Visit: Payer: Self-pay | Admitting: Cardiology

## 2019-12-04 ENCOUNTER — Other Ambulatory Visit: Payer: Self-pay | Admitting: Cardiology

## 2019-12-29 ENCOUNTER — Other Ambulatory Visit: Payer: Self-pay | Admitting: Cardiology

## 2020-01-13 ENCOUNTER — Other Ambulatory Visit: Payer: Self-pay | Admitting: Cardiology

## 2020-01-16 ENCOUNTER — Telehealth: Payer: Self-pay | Admitting: Allergy

## 2020-01-16 NOTE — Telephone Encounter (Signed)
Late entry  Contacted patient's daughter on 1026 around 6 PM.  Pt states she is a pt of Dr. Neldon Mc.  She states her mother was having a flare of her hives.  She put her mother on the phone who states for the past 2 weeks se has been having a flare of her hives.  They are miserable and worsening.  She states she can't do anything do the itch and burning sensation.  She states she didn't take her routine medication for her BP or other health issues in an attempt to cleanse her symptom.  She states today she took 2 10mg  prednisone (had left over) and 2 benadryl as well as has been applying preparation H, taking oatmeal baths, applying witch hazel, mineral oil and HC cream to get some relief.   I recommended since she was quite symptomatic that she do the following: prednisone 20mg  twice a day x 5 days, zyrtec 10mg  bid, pepcid bid.   Advised that she continue her regular health maintenance medications.   Can someone check in on her today?

## 2020-01-17 NOTE — Telephone Encounter (Signed)
[name] states that she is so much better and she said to tell you thank you so much. I did tell her to call us if her progress changes in any way.

## 2020-02-25 DIAGNOSIS — I10 Essential (primary) hypertension: Secondary | ICD-10-CM | POA: Diagnosis not present

## 2020-03-28 ENCOUNTER — Other Ambulatory Visit: Payer: Self-pay | Admitting: Cardiology

## 2020-04-03 DIAGNOSIS — R6889 Other general symptoms and signs: Secondary | ICD-10-CM | POA: Diagnosis not present

## 2020-04-03 DIAGNOSIS — J029 Acute pharyngitis, unspecified: Secondary | ICD-10-CM | POA: Diagnosis not present

## 2020-04-03 DIAGNOSIS — Z20822 Contact with and (suspected) exposure to covid-19: Secondary | ICD-10-CM | POA: Diagnosis not present

## 2020-04-03 DIAGNOSIS — Z6837 Body mass index (BMI) 37.0-37.9, adult: Secondary | ICD-10-CM | POA: Diagnosis not present

## 2020-04-03 DIAGNOSIS — I10 Essential (primary) hypertension: Secondary | ICD-10-CM | POA: Diagnosis not present

## 2020-04-11 ENCOUNTER — Other Ambulatory Visit: Payer: Self-pay | Admitting: Cardiology

## 2020-05-28 ENCOUNTER — Other Ambulatory Visit: Payer: Self-pay | Admitting: Cardiology

## 2020-05-28 DIAGNOSIS — I5042 Chronic combined systolic (congestive) and diastolic (congestive) heart failure: Secondary | ICD-10-CM

## 2020-05-31 ENCOUNTER — Other Ambulatory Visit: Payer: Self-pay | Admitting: Cardiology

## 2020-05-31 DIAGNOSIS — I5042 Chronic combined systolic (congestive) and diastolic (congestive) heart failure: Secondary | ICD-10-CM

## 2020-06-03 ENCOUNTER — Other Ambulatory Visit: Payer: Self-pay | Admitting: Cardiology

## 2020-06-03 DIAGNOSIS — I5042 Chronic combined systolic (congestive) and diastolic (congestive) heart failure: Secondary | ICD-10-CM

## 2020-06-06 ENCOUNTER — Other Ambulatory Visit: Payer: Self-pay

## 2020-06-20 ENCOUNTER — Other Ambulatory Visit: Payer: Self-pay

## 2020-06-20 ENCOUNTER — Other Ambulatory Visit: Payer: Self-pay | Admitting: Cardiology

## 2020-06-20 DIAGNOSIS — E782 Mixed hyperlipidemia: Secondary | ICD-10-CM

## 2020-06-20 MED ORDER — ROSUVASTATIN CALCIUM 10 MG PO TABS: 10.0000 mg | ORAL_TABLET | Freq: Every day | ORAL | 2 refills | Status: DC

## 2020-06-24 ENCOUNTER — Encounter: Payer: Self-pay | Admitting: Cardiology

## 2020-06-24 ENCOUNTER — Other Ambulatory Visit: Payer: Self-pay

## 2020-06-24 ENCOUNTER — Ambulatory Visit: Payer: Medicare Other | Admitting: Cardiology

## 2020-06-24 VITALS — BP 145/83 | HR 70 | Temp 97.7°F | Resp 16 | Ht <= 58 in | Wt 191.0 lb

## 2020-06-24 DIAGNOSIS — I420 Dilated cardiomyopathy: Secondary | ICD-10-CM | POA: Diagnosis not present

## 2020-06-24 DIAGNOSIS — E782 Mixed hyperlipidemia: Secondary | ICD-10-CM | POA: Diagnosis not present

## 2020-06-24 DIAGNOSIS — I10 Essential (primary) hypertension: Secondary | ICD-10-CM | POA: Diagnosis not present

## 2020-06-24 DIAGNOSIS — F17200 Nicotine dependence, unspecified, uncomplicated: Secondary | ICD-10-CM | POA: Diagnosis not present

## 2020-06-24 DIAGNOSIS — Z6841 Body Mass Index (BMI) 40.0 and over, adult: Secondary | ICD-10-CM | POA: Diagnosis not present

## 2020-06-24 DIAGNOSIS — I5042 Chronic combined systolic (congestive) and diastolic (congestive) heart failure: Secondary | ICD-10-CM

## 2020-06-24 DIAGNOSIS — R072 Precordial pain: Secondary | ICD-10-CM

## 2020-06-24 DIAGNOSIS — R0609 Other forms of dyspnea: Secondary | ICD-10-CM | POA: Diagnosis not present

## 2020-06-24 DIAGNOSIS — R0600 Dyspnea, unspecified: Secondary | ICD-10-CM

## 2020-06-24 MED ORDER — BIDIL 20-37.5 MG PO TABS
2.0000 | ORAL_TABLET | Freq: Three times a day (TID) | ORAL | 0 refills | Status: AC
Start: 2020-06-24 — End: 2020-07-24

## 2020-06-24 NOTE — Progress Notes (Signed)
Date:  06/24/2020   ID:  [name], DOB [birth year], MRN [id number]  PCP:  Cher Nakai, MD  Cardiologist:  Rex Kras, DO, [facility] (established care 05/31/2019) Former Cardiology Providers: Dr. Golden Hurter, Jeri Lager, APRN, FNP-C  Date: 06/24/20 Last Office Visit: 05/31/2019  Chief Complaint  Patient presents with  . Hypertension  . Follow-up  . Chronic combined systolic and diastolic heart failure     HPI  [name] is a 60 y.o. 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 She also has hypertension,  hyperlipidemia, stage III chronic kidney disease, hyperlipidemia, morbid obesity, tobacco use disorder.    Patient was last seen in the office in March 2021 as a virtual visit given the COVID-19 pandemic.  She now returns for a 1 year follow-up for congestive heart failure management and medication refills.  Since last office visit patient has not been hospitalized for congestive heart failure or cardiovascular symptoms.  Patient states that she has shortness of breath which is chronic and stable.  However, recently she notes that after gardening at times she has a flareup of her allergies which makes her difficult to breathe and therefore takes a sublingual nitroglycerin tablets and her symptoms resolved thereafter.  Of note, patient is already on isosorbide mononitrate and regular basis.  Precordial pain: Patient states that at least once a month she has chest tightness, intensity 7 out of 10, pressure-like sensation, lasting for 10 minutes, improves with resting or sublingual nitroglycerin tablets.   Patient states that she does not require more than 2 sublingual nitroglycerin tablets.  The symptoms are usually worse after working in her garden and hot summer days or for prolonged period of time.  She also has had chest discomfort going up a flight of stairs.  Patient states that the symptoms have been going on for at least 1 year or more.  The intensity, frequency, and duration have not worsened.  Unfortunately, patient continues to smoke 7 cigarettes/day.  FUNCTIONAL STATUS: No structured exercise program or daily routine.   ALLERGIES: Allergies  Allergen Reactions  . Aspirin Hives  . Docetaxel Other (See Comments)    Chest tightness and pain  . Morphine And Related Hives and Swelling  . Penicillins Hives    Has patient had a PCN reaction causing immediate rash, facial/tongue/throat swelling, SOB or lightheadedness with hypotension: Yes Has patient had a PCN reaction causing severe rash involving mucus membranes or skin necrosis: No Has patient had a PCN reaction that required hospitalization No Has patient had a PCN reaction occurring within the last 10 years: Yes If all of the above answers are "NO", then may proceed with Cephalosporin use.  . Clindamycin/Lincomycin Hives  . [employer] [place]  . Lipitor [Atorvastatin] Hives  . Lisinopril Hives  . Other Hives    Reaction to cats and dogs  . Peanuts [Peanut Oil] Hives    MEDICATION LIST PRIOR TO VISIT: Current Meds  Medication Sig  . amLODipine (NORVASC) 5 MG tablet TAKE ONE TABLET BY MOUTH DAILY  . anastrozole (ARIMIDEX) 1 MG tablet Take 1 tablet (1 mg total) by mouth daily.  . budesonide (RHINOCORT AQUA) 32 MCG/ACT nasal  spray Place 1 spray into both nostrils daily as needed for rhinitis or allergies.   . budesonide-formoterol (SYMBICORT) 160-4.5 MCG/ACT inhaler Inhale two puffs twice daily as directed.  Rinse, gargle, and spit after use.  . diphenhydrAMINE (BENADRYL) 25 MG tablet Take 37.5 mg by mouth daily as  needed for itching.  . diphenhydramine-acetaminophen (TYLENOL PM) 25-500 MG TABS Take 3 tablets by mouth at bedtime.  . fexofenadine (ALLEGRA) 180 MG tablet Take 1 tablet (180 mg total) by mouth daily.  . furosemide (LASIX) 20 MG tablet Take 1 tablet (20 mg total) by mouth in the morning.  . hydrOXYzine (ATARAX/VISTARIL) 25 MG tablet Take 25 mg by mouth 4 (four) times daily.  . isosorbide-hydrALAZINE (BIDIL) 20-37.5 MG tablet Take 2 tablets by mouth 3 (three) times daily.  [name] levothyroxine (SYNTHROID, LEVOTHROID) 88 MCG tablet Take 88 mcg by mouth daily before breakfast.  . metoprolol (TOPROL-XL) 200 MG 24 hr tablet TAKE ONE TABLET BY MOUTH DAILY  . montelukast (SINGULAIR) 10 MG tablet Take 1 tablet (10 mg total) by mouth daily.  . nitroGLYCERIN (NITROSTAT) 0.4 MG SL tablet Place 1 tablet (0.4 mg total) under the tongue every 5 (five) minutes as needed for chest pain.  [name] omeprazole (PRILOSEC) 20 MG capsule Take 20 mg by mouth 2 (two) times daily.  [name] oxyCODONE-acetaminophen (PERCOCET) 7.5-325 MG tablet Take 1 tablet by mouth.  . polyethylene glycol (MIRALAX / GLYCOLAX) packet Take 17 g by mouth daily. Mix in 8 oz liquid and drink  . rosuvastatin (CRESTOR) 10 MG tablet Take 1 tablet (10 mg total) by mouth at bedtime.  [name] spironolactone (ALDACTONE) 50 MG tablet TAKE ONE TABLET BY MOUTH EVERY MORNING  . zolpidem (AMBIEN) 10 MG tablet Take 10 mg by mouth at bedtime.  . [DISCONTINUED] hydrALAZINE (APRESOLINE) 50 MG tablet Take 1 tablet (50 mg total) by mouth 3 (three) times daily.  . [DISCONTINUED] isosorbide mononitrate (IMDUR) 60 MG 24 hr tablet TAKE ONE TABLET BY MOUTH DAILY     PAST MEDICAL HISTORY: Past Medical History:  Diagnosis Date  . Anemia 05/19/2011   "w/cancer tx"  . Animal dander allergy    "cats and dogs"  . Anxiety   . Arthritis    "lower back" (06/28/2016)  . Breast cancer ([place]) 07/16/2011   left breast lumpectomy=invasive ductal ca,2. cm,high grade ductal ca in situ,11 benign  lymph nodes  . Chest pain, unspecified    Sharp midsternal chest pain at night with no radiation. Hard for her to take a deep breath in. NTG does help. Normal Myocardial perfusion study 05/14/11.  [name] Chronic combined systolic and diastolic CHF (congestive heart failure) ([place])   . Chronic lower back pain   . Chronic lower back pain   . Congestive heart failure ([place])   . Coronary artery disease    Negative cath 2001  . Daily headache    "just recently; I'm not used to having headaches" (06/28/2016)  . Depression   . Emphysema of lung ([place])   . GERD (gastroesophageal reflux disease)   . Heart murmur   . History of blood transfusion 2013   "w/cancer tx"  . History of bronchitis   . History of radiation therapy 09/06/11-11/24/11   left breast  60.4gray total dose  . Hyperlipidemia   . Hypertension   . Hypothyroidism   . Myocardial infarction ([place]) 2003  . Neuromuscular disorder (HCC)    tingling left hand  . Nonischemic cardiomyopathy (HCC)    LVEF 38%. MUGA, 2010, 50-55% by  ECHO 04/14/11. Cardiologist Dr. [name]  . Obesity   . Other primary cardiomyopathies   . Shortness of breath 07/16/11   "at all times"  . Urticaria     PAST SURGICAL HISTORY: Past Surgical History:  Procedure Laterality Date  . ABDOMINAL HYSTERECTOMY  ~2006   partial  . BREAST BIOPSY Left   . BREAST LUMPECTOMY Left 07/16/11   w/LND; left.ER/PR=positive  . CARDIAC CATHETERIZATION  2001   negative for CAD  . CATARACT EXTRACTION, BILATERAL Bilateral 10/17/2018   10/28/2018- Right 10/17/2018-left eye  . [place]; 1985; 1991  . PORT-A-CATH REMOVAL N/A 07/31/2012   Procedure: REMOVAL PORT-A-CATH;  Surgeon: Harl Bowie, MD;  Location: [facility];  Service: General;  Laterality: N/A;  . PORTACATH PLACEMENT Right 02/26/2011   Procedure: INSERTION PORT-A-CATH;  Surgeon: Harl Bowie, MD;  Location: [place];  Service: General;  Laterality: Right;  . TUBAL LIGATION  1991    FAMILY  HISTORY: The patient family history includes Allergic rhinitis in her grandchild; Asthma in her grandchild; Eczema in her grandchild; Heart disease in her maternal grandmother; Hyperlipidemia in her brother; Hypertension in her brother and brother; Kidney failure in her father; Leukemia in her mother.  SOCIAL HISTORY:  The patient  reports that she has been smoking cigarettes. She has a 7.50 pack-year smoking history. She has never used smokeless tobacco. She reports previous alcohol use. She reports that she does not use drugs.  REVIEW OF SYSTEMS: Review of Systems  Constitutional: Positive for malaise/fatigue. Negative for chills and fever.  HENT: Negative for hoarse voice and nosebleeds.   Eyes: Negative for discharge, double vision and pain.  Cardiovascular: Positive for chest pain and dyspnea on exertion. Negative for claudication, leg swelling, near-syncope, orthopnea, palpitations, paroxysmal nocturnal dyspnea and syncope.  Respiratory: Negative for hemoptysis and shortness of breath.   Musculoskeletal: Negative for muscle cramps and myalgias.  Gastrointestinal: Negative for abdominal pain, constipation, diarrhea, hematemesis, hematochezia, melena, nausea and vomiting.  Neurological: Negative for dizziness and light-headedness.    PHYSICAL EXAM: Vitals with BMI 06/24/2020 05/31/2019 11/15/2018  Height _0  _1  _2   Weight 191 lbs 184 lbs 190 lbs 13 oz  BMI 41.32 99.37 16.96  Systolic 789 - 381  Diastolic 83 - 81  Pulse 70 - 77    CONSTITUTIONAL: Appears older than stated age, hemodynamically stable, well-developed and well-nourished. No acute distress.  SKIN: Skin is warm and dry. No rash noted. No cyanosis. No pallor. No jaundice HEAD: Normocephalic and atraumatic.  EYES: No scleral icterus MOUTH/THROAT: Moist oral membranes.  NECK: No JVD present. No thyromegaly noted. No carotid bruits  LYMPHATIC: No visible cervical adenopathy.  CHEST Normal respiratory effort. No  intercostal retractions  LUNGS: Clear to auscultation bilaterally.  No stridor. No wheezes. No rales.  CARDIOVASCULAR: Regular, positive S1-S2, no murmurs rubs or gallops appreciated ABDOMINAL: Obese, soft, nontender, nondistended, positive bowel sounds in all 4 quadrants, no apparent ascites.  EXTREMITIES: No peripheral edema.  Left upper extremity is swollen secondary to lymphedema (chronic and stable). HEMATOLOGIC: No significant bruising NEUROLOGIC: Oriented to person, place, and time. Nonfocal. Normal muscle tone.  PSYCHIATRIC: Normal mood and affect. Normal behavior. Cooperative  CARDIAC DATABASE: EKG: 06/24/2020: Normal sinus rhythm, 69 bpm, without underlying ischemia or injury pattern.  Echocardiogram: 06/25/2014: LVEF 50-55%, mildly dilated left atrium, mild MR, mild TR.  05/24/2019: LVEF 45-50%, mild global hypokinesis, grade 1 diastolic impairment, normal left atrial pressure, left atrium mildly dilated, mild MR.  Stress Testing: No results found for this or any previous visit from the past 1095 days.  Heart Catheterization: Coronary Angiogram 08/07/2010: Normal coronary arteries, moderately depressed ejection fraction with a EF of 30-35% with global hypokinesis. Non isvchemic dilated cardiomyopathy.  LABORATORY DATA: CBC Latest Ref Rng & Units 06/30/2016 06/29/2016 06/28/2016  WBC 4.0 - 10.5 K/uL 5.8 9.6 11.6(H)  Hemoglobin 12.0 - 15.0 g/dL 11.9(L) 11.1(L) 12.1  Hematocrit 36.0 - 46.0 % 36.8 34.5(L) 36.7  Platelets 150 - 400 K/uL 145(L) 141(L) 145(L)    CMP Latest Ref Rng & Units 06/30/2016 06/29/2016 06/28/2016  Glucose 65 - 99 mg/dL 105(H) 105(H) 108(H)  BUN 6 - 20 mg/dL _0 Creatinine 0.44 - 1.00 mg/dL 1.02(H) 1.18(H) 1.26(H)  Sodium 135 - 145 mmol/L 137 138 136  Potassium 3.5 - 5.1 mmol/L 4.2 4.1 3.3(L)  Chloride 101 - 111 mmol/L 105 110 103  CO2 22 - 32 mmol/L _1 Calcium 8.9 - 10.3 mg/dL 9.2 8.6(L) 8.7(L)  Total Protein 6.5 - 8.1 g/dL - - 6.1(L)  Total  Bilirubin 0.3 - 1.2 mg/dL - - 0.6  Alkaline Phos 38 - 126 U/L - - 58  AST 15 - 41 U/L - - 14(L)  ALT 14 - 54 U/L - - 18    Lipid Panel  No results found for: CHOL, TRIG, HDL, CHOLHDL, VLDL, LDLCALC, LDLDIRECT, LABVLDL  No components found for: NTPROBNP No results for input(s): PROBNP in the last 8760 hours. No results for input(s): TSH in the last 8760 hours.  BMP No results for input(s): NA, K, CL, CO2, GLUCOSE, BUN, CREATININE, CALCIUM, GFRNONAA, GFRAA in the last 8760 hours.  HEMOGLOBIN A1C No results found for: HGBA1C, MPG   External Labs: Collected: 06/27/2019 Creatinine 1.26 mg/dL. eGFR: 55 mL/min per 1.73 m Potassium 4.1 AST 18, ALT 16, alkaline phosphatase 82 Lipid profile: Total cholesterol 164, triglycerides 86, HDL 57, LDL 91  IMPRESSION:    ICD-10-CM   1. Chronic combined systolic and diastolic heart failure (HCC)  I50.42 EKG 78-GNFA    Basic metabolic panel    Pro b natriuretic peptide (BNP)    Magnesium    Magnesium    Pro b natriuretic peptide (BNP)    Basic metabolic panel  2. Precordial pain  R07.2 PCV MYOCARDIAL PERFUSION WITH LEXISCAN    isosorbide-hydrALAZINE (BIDIL) 20-37.5 MG tablet  3. Dyspnea on exertion  R06.00 PCV MYOCARDIAL PERFUSION WITH LEXISCAN    isosorbide-hydrALAZINE (BIDIL) 20-37.5 MG tablet    Basic metabolic panel    Pro b natriuretic peptide (BNP)    Magnesium    Magnesium    Pro b natriuretic peptide (BNP)    Basic metabolic panel  4. Nonischemic dilated cardiomyopathy (HCC)  I42.0 PCV MYOCARDIAL PERFUSION WITH LEXISCAN    isosorbide-hydrALAZINE (BIDIL) 20-37.5 MG tablet  5. Essential hypertension  I10 isosorbide-hydrALAZINE (BIDIL) 20-37.5 MG tablet  6. Mixed hyperlipidemia  E78.2   7. Tobacco use disorder  F17.200   8. Class 3 severe obesity due to excess calories with serious comorbidity and body mass index (BMI) of 40.0 to 44.9 in adult [facility])  E66.01    Z68.41      RECOMMENDATIONS: [name] is a 60 y.o. female whose  past medical history and cardiac risk factors include: history of left breast cancer status post lumpectomy and received radiation and chemotherapy 2012 and had developed nonischemic dilated cardiomyopathy with severe LV systolic dysfunction and normal coronary arteries which improved in 2015. She also  has hypertension,  hyperlipidemia, stage III chronic kidney disease, hyperlipidemia, morbid obesity, tobacco use disorder.    Chronic combined systolic and diastolic heart failure:  Overall euvolemic.  No recent hospitalizations since last office visit.  Last echocardiogram noted LVEF of 45-50%.  Medications reconciled  Patient is currently not on ACE inhibitors, ARB's, or Arni.  Patient has had allergic reaction to lisinopril which she recalls as having hives.  But other allergies have caused her to have symptoms similar to angioedema.  We discussed that we can initiate a trial of losartan or Entresto given her LVEF; however, patient refuses as she is concerned of symptoms of angioedema.  Continue Toprol-XL 200 mg p.o. daily.  Currently on isosorbide mononitrate and hydralazine.  We will transition her to BiDil.  Continue spironolactone  Continue Lasix.  We will consider the initiation of Farxiga at the next office visit.  Outside labs from 06/27/2019 reviewed.  Recommend repeat blood work prior to the next office visit.  Precordial chest pain:  Patient symptoms concerning for possible cardiac etiology  Despite being on isosorbide mononitrate she has been taking sublingual nitroglycerin tablets on a regular basis and has effort related symptoms.  Her last heart catheterization was in 2012 results noted above.  Shared decision was to proceed with Lexiscan to evaluate for reversible ischemia.  In the interim, if her symptoms get worse in intensity, frequency, and/or duration she is asked to seek medical attention by going to the closest ER via EMS for more urgent evaluation.  Patient  verbalizes understanding.  Dyspnea on exertion: See above  Benign essential hypertension:  Office blood pressures not well controlled but within acceptable range.  Patient is asked to keep a log of her blood pressures and to bring it in at the next office visit when she sees either myself or her PCP for further medication titration if clinically warranted.  Low-salt diet recommended  Would recommend a goal systolic blood pressure between 120-130 mmHg if able to tolerate.  Smoking:  Patient currently smokes 7 cigarettes/day.  Tobacco cessation counseling:  Patient was informed of the dangers of tobacco abuse including stroke, cancer, and MI, as well as benefits of tobacco cessation.  Patient is not willing to quit at this time.  Approximately 8 mins were spent counseling patient cessation techniques. We discussed various methods to help quit smoking, including deciding on a date to quit, joining a support group, pharmacological agents- nicotine gum/patch/lozenges, chantix.   I will reassess her progress at the next follow-up visit  FINAL MEDICATION LIST END OF ENCOUNTER: Meds ordered this encounter  Medications  . isosorbide-hydrALAZINE (BIDIL) 20-37.5 MG tablet    Sig: Take 2 tablets by mouth 3 (three) times daily.    Dispense:  180 tablet    Refill:  0    Medications Discontinued During This Encounter  Medication Reason  . Melatonin 10 MG TABS Error  . isosorbide mononitrate (IMDUR) 60 MG 24 hr tablet Discontinued by provider  . hydrALAZINE (APRESOLINE) 50 MG tablet Change in therapy     Current Outpatient Medications:  .  amLODipine (NORVASC) 5 MG tablet, TAKE ONE TABLET BY MOUTH DAILY, Disp: 90 tablet, Rfl: 0 .  anastrozole (ARIMIDEX) 1 MG tablet, Take 1 tablet (1 mg total) by mouth daily., Disp: 90 tablet, Rfl: 1 .  budesonide (RHINOCORT AQUA) 32 MCG/ACT nasal spray, Place 1 spray into both nostrils daily as needed for rhinitis or allergies. , Disp: , Rfl:  .   budesonide-formoterol (SYMBICORT) 160-4.5 MCG/ACT inhaler,  Inhale two puffs twice daily as directed.  Rinse, gargle, and spit after use., Disp: 1 Inhaler, Rfl: 11 .  diphenhydrAMINE (BENADRYL) 25 MG tablet, Take 37.5 mg by mouth daily as needed for itching., Disp: , Rfl:  .  diphenhydramine-acetaminophen (TYLENOL PM) 25-500 MG TABS, Take 3 tablets by mouth at bedtime., Disp: , Rfl:  .  fexofenadine (ALLEGRA) 180 MG tablet, Take 1 tablet (180 mg total) by mouth daily., Disp: 30 tablet, Rfl: 11 .  furosemide (LASIX) 20 MG tablet, Take 1 tablet (20 mg total) by mouth in the morning., Disp: 90 tablet, Rfl: 2 .  hydrOXYzine (ATARAX/VISTARIL) 25 MG tablet, Take 25 mg by mouth 4 (four) times daily., Disp: , Rfl:  .  isosorbide-hydrALAZINE (BIDIL) 20-37.5 MG tablet, Take 2 tablets by mouth 3 (three) times daily., Disp: 180 tablet, Rfl: 0 .  levothyroxine (SYNTHROID, LEVOTHROID) 88 MCG tablet, Take 88 mcg by mouth daily before breakfast., Disp: , Rfl:  .  metoprolol (TOPROL-XL) 200 MG 24 hr tablet, TAKE ONE TABLET BY MOUTH DAILY, Disp: 90 tablet, Rfl: 0 .  montelukast (SINGULAIR) 10 MG tablet, Take 1 tablet (10 mg total) by mouth daily., Disp: 30 tablet, Rfl: 11 .  nitroGLYCERIN (NITROSTAT) 0.4 MG SL tablet, Place 1 tablet (0.4 mg total) under the tongue every 5 (five) minutes as needed for chest pain., Disp: 25 tablet, Rfl: 4 .  omeprazole (PRILOSEC) 20 MG capsule, Take 20 mg by mouth 2 (two) times daily., Disp: , Rfl:  .  oxyCODONE-acetaminophen (PERCOCET) 7.5-325 MG tablet, Take 1 tablet by mouth., Disp: , Rfl:  .  polyethylene glycol (MIRALAX / GLYCOLAX) packet, Take 17 g by mouth daily. Mix in 8 oz liquid and drink, Disp: , Rfl:  .  rosuvastatin (CRESTOR) 10 MG tablet, Take 1 tablet (10 mg total) by mouth at bedtime., Disp: 90 tablet, Rfl: 2 .  spironolactone (ALDACTONE) 50 MG tablet, TAKE ONE TABLET BY MOUTH EVERY MORNING, Disp: 90 tablet, Rfl: 0 .  zolpidem (AMBIEN) 10 MG tablet, Take 10 mg by mouth at  bedtime., Disp: , Rfl:  No current facility-administered medications for this visit.  Facility-Administered Medications Ordered in Other Visits:  .  sodium chloride 0.9 % injection 10 mL, 10 mL, Intracatheter, PRN, Magrinat, Virgie Dad, MD  Orders Placed This Encounter  Procedures  . Basic metabolic panel  . Pro b natriuretic peptide (BNP)  . Magnesium  . PCV MYOCARDIAL PERFUSION WITH LEXISCAN  . EKG 12-Lead    There are no Patient Instructions on file for this visit.   --Continue cardiac medications as reconciled in final medication list. --Return in about 4 weeks (around 07/22/2020) for Follow up, Chest pain, Dyspnea, Review test results. Or sooner if needed. --Continue follow-up with your primary care physician regarding the management of your other chronic comorbid conditions.  Patient's questions and concerns were addressed to her satisfaction. She voices understanding of the instructions provided during this encounter.   This note was created using a voice recognition software as a result there may be grammatical errors inadvertently enclosed that do not reflect the nature of this encounter. Every attempt is made to correct such errors.  Rex Kras, [place], [facility]  Pager: [phone] Office: [phone]

## 2020-06-26 DIAGNOSIS — I10 Essential (primary) hypertension: Secondary | ICD-10-CM | POA: Diagnosis not present

## 2020-06-28 ENCOUNTER — Other Ambulatory Visit: Payer: Self-pay | Admitting: Cardiology

## 2020-06-30 ENCOUNTER — Other Ambulatory Visit: Payer: Self-pay | Admitting: Cardiology

## 2020-06-30 DIAGNOSIS — I5042 Chronic combined systolic (congestive) and diastolic (congestive) heart failure: Secondary | ICD-10-CM

## 2020-07-02 ENCOUNTER — Other Ambulatory Visit: Payer: Self-pay | Admitting: Cardiology

## 2020-07-08 ENCOUNTER — Other Ambulatory Visit: Payer: Self-pay | Admitting: Cardiology

## 2020-07-23 ENCOUNTER — Other Ambulatory Visit: Payer: Medicare Other

## 2020-07-28 ENCOUNTER — Telehealth: Payer: Self-pay

## 2020-07-28 NOTE — Telephone Encounter (Signed)
done

## 2020-08-05 ENCOUNTER — Other Ambulatory Visit: Payer: Self-pay | Admitting: Cardiology

## 2020-08-12 ENCOUNTER — Telehealth: Payer: Self-pay | Admitting: Allergy & Immunology

## 2020-08-12 NOTE — Telephone Encounter (Signed)
Patient schedule appointment on 08/25/2020 at 1:30 with Dr. Neldon Mc at our [place] location

## 2020-08-12 NOTE — Telephone Encounter (Signed)
I received a call from Jossalyn's daughter.  She reports that she broke out in hives.  She has been treating with Benadryl without much relief.  Evidently, the last time she had this prednisone and famotidine worked well.  Therefore, I sent in prednisone 30 mg twice daily for 5 days and combination with famotidine 40 mg twice daily for 2 weeks.  She did not have any other systemic symptoms including throat swelling, wheezing, coughing, or other concerns.  This is a late entry and I did not realize that she needed an appointment.  Can someone call her and schedule a follow-up and make sure that her hives are getting better?  Salvatore Marvel, MD [facility] [place] [facility] [place]

## 2020-08-25 ENCOUNTER — Ambulatory Visit: Payer: Self-pay | Admitting: Allergy and Immunology

## 2020-08-28 ENCOUNTER — Other Ambulatory Visit: Payer: Self-pay | Admitting: Cardiology

## 2020-09-01 ENCOUNTER — Ambulatory Visit: Payer: Medicare Other | Admitting: Cardiology

## 2020-09-01 ENCOUNTER — Other Ambulatory Visit: Payer: Self-pay

## 2020-09-01 ENCOUNTER — Ambulatory Visit: Payer: Medicare Other

## 2020-09-01 DIAGNOSIS — R072 Precordial pain: Secondary | ICD-10-CM | POA: Diagnosis not present

## 2020-09-01 DIAGNOSIS — I420 Dilated cardiomyopathy: Secondary | ICD-10-CM

## 2020-09-01 DIAGNOSIS — R0609 Other forms of dyspnea: Secondary | ICD-10-CM

## 2020-09-06 ENCOUNTER — Other Ambulatory Visit: Payer: Self-pay | Admitting: Cardiology

## 2020-09-06 DIAGNOSIS — R0609 Other forms of dyspnea: Secondary | ICD-10-CM

## 2020-09-06 DIAGNOSIS — I5042 Chronic combined systolic (congestive) and diastolic (congestive) heart failure: Secondary | ICD-10-CM

## 2020-09-10 ENCOUNTER — Ambulatory Visit: Payer: Medicare Other | Admitting: Cardiology

## 2020-09-19 DIAGNOSIS — Z1331 Encounter for screening for depression: Secondary | ICD-10-CM | POA: Diagnosis not present

## 2020-09-19 DIAGNOSIS — E785 Hyperlipidemia, unspecified: Secondary | ICD-10-CM | POA: Diagnosis not present

## 2020-09-19 DIAGNOSIS — Z9181 History of falling: Secondary | ICD-10-CM | POA: Diagnosis not present

## 2020-09-19 DIAGNOSIS — Z0000 Encounter for general adult medical examination without abnormal findings: Secondary | ICD-10-CM | POA: Diagnosis not present

## 2020-09-20 ENCOUNTER — Other Ambulatory Visit: Payer: Self-pay | Admitting: Cardiology

## 2020-09-20 DIAGNOSIS — I5042 Chronic combined systolic (congestive) and diastolic (congestive) heart failure: Secondary | ICD-10-CM

## 2020-09-23 NOTE — Progress Notes (Signed)
Spoke to patient she is aware

## 2020-09-27 ENCOUNTER — Other Ambulatory Visit: Payer: Self-pay | Admitting: Cardiology

## 2020-09-27 DIAGNOSIS — I5042 Chronic combined systolic (congestive) and diastolic (congestive) heart failure: Secondary | ICD-10-CM

## 2020-09-29 ENCOUNTER — Ambulatory Visit: Payer: Medicare Other | Admitting: Cardiology

## 2020-09-30 ENCOUNTER — Telehealth: Payer: Self-pay

## 2020-10-01 ENCOUNTER — Other Ambulatory Visit: Payer: Self-pay

## 2020-10-01 MED ORDER — ISOSORBIDE MONONITRATE ER 60 MG PO TB24: 60.0000 mg | ORAL_TABLET | Freq: Every day | ORAL | 0 refills | Status: DC

## 2020-10-01 MED ORDER — HYDRALAZINE HCL 50 MG PO TABS: 50.0000 mg | ORAL_TABLET | Freq: Three times a day (TID) | ORAL | 0 refills | Status: DC

## 2020-10-16 DIAGNOSIS — I10 Essential (primary) hypertension: Secondary | ICD-10-CM | POA: Diagnosis not present

## 2020-10-17 NOTE — Progress Notes (Signed)
Spoke to patient she is aware she needs to f/u patient just had woken up and stated she will call back to schedule f/u

## 2020-10-22 ENCOUNTER — Other Ambulatory Visit: Payer: Self-pay

## 2020-10-22 MED ORDER — HYDRALAZINE HCL 50 MG PO TABS: 50.0000 mg | ORAL_TABLET | Freq: Three times a day (TID) | ORAL | 1 refills | Status: DC

## 2020-10-27 ENCOUNTER — Other Ambulatory Visit: Payer: Self-pay | Admitting: Cardiology

## 2020-11-11 ENCOUNTER — Other Ambulatory Visit: Payer: Self-pay | Admitting: Cardiology

## 2020-11-11 DIAGNOSIS — I5042 Chronic combined systolic (congestive) and diastolic (congestive) heart failure: Secondary | ICD-10-CM

## 2020-11-13 DIAGNOSIS — N1831 Chronic kidney disease, stage 3a: Secondary | ICD-10-CM | POA: Diagnosis not present

## 2020-11-28 ENCOUNTER — Other Ambulatory Visit: Payer: Self-pay | Admitting: Cardiology

## 2020-12-01 ENCOUNTER — Other Ambulatory Visit: Payer: Self-pay | Admitting: Cardiology

## 2020-12-02 ENCOUNTER — Telehealth: Payer: Self-pay | Admitting: Cardiology

## 2020-12-02 NOTE — Telephone Encounter (Signed)
Pt is calling about two different medication refills she needs

## 2020-12-03 NOTE — Telephone Encounter (Signed)
Called patient, NA, LMAM

## 2020-12-08 ENCOUNTER — Other Ambulatory Visit: Payer: Self-pay | Admitting: Cardiology

## 2020-12-08 DIAGNOSIS — R0600 Dyspnea, unspecified: Secondary | ICD-10-CM

## 2020-12-08 DIAGNOSIS — I5042 Chronic combined systolic (congestive) and diastolic (congestive) heart failure: Secondary | ICD-10-CM

## 2020-12-08 DIAGNOSIS — R0609 Other forms of dyspnea: Secondary | ICD-10-CM

## 2020-12-12 ENCOUNTER — Other Ambulatory Visit: Payer: Self-pay | Admitting: Cardiology

## 2020-12-12 DIAGNOSIS — I5042 Chronic combined systolic (congestive) and diastolic (congestive) heart failure: Secondary | ICD-10-CM

## 2020-12-25 ENCOUNTER — Other Ambulatory Visit: Payer: Self-pay | Admitting: Cardiology

## 2021-01-02 ENCOUNTER — Other Ambulatory Visit: Payer: Self-pay | Admitting: Cardiology

## 2021-01-16 DIAGNOSIS — K2900 Acute gastritis without bleeding: Secondary | ICD-10-CM | POA: Diagnosis not present

## 2021-01-16 DIAGNOSIS — K047 Periapical abscess without sinus: Secondary | ICD-10-CM | POA: Diagnosis not present

## 2021-01-16 DIAGNOSIS — R110 Nausea: Secondary | ICD-10-CM | POA: Diagnosis not present

## 2021-01-21 DIAGNOSIS — R519 Headache, unspecified: Secondary | ICD-10-CM | POA: Diagnosis not present

## 2021-01-21 DIAGNOSIS — N1831 Chronic kidney disease, stage 3a: Secondary | ICD-10-CM | POA: Diagnosis not present

## 2021-01-21 DIAGNOSIS — I509 Heart failure, unspecified: Secondary | ICD-10-CM | POA: Diagnosis not present

## 2021-01-21 DIAGNOSIS — J309 Allergic rhinitis, unspecified: Secondary | ICD-10-CM | POA: Diagnosis not present

## 2021-01-21 DIAGNOSIS — F329 Major depressive disorder, single episode, unspecified: Secondary | ICD-10-CM | POA: Diagnosis not present

## 2021-01-21 DIAGNOSIS — E039 Hypothyroidism, unspecified: Secondary | ICD-10-CM | POA: Diagnosis not present

## 2021-01-21 DIAGNOSIS — C50919 Malignant neoplasm of unspecified site of unspecified female breast: Secondary | ICD-10-CM | POA: Diagnosis not present

## 2021-01-21 DIAGNOSIS — D464 Refractory anemia, unspecified: Secondary | ICD-10-CM | POA: Diagnosis not present

## 2021-01-21 DIAGNOSIS — Z888 Allergy status to other drugs, medicaments and biological substances status: Secondary | ICD-10-CM | POA: Diagnosis not present

## 2021-01-21 DIAGNOSIS — Z87891 Personal history of nicotine dependence: Secondary | ICD-10-CM | POA: Diagnosis not present

## 2021-01-21 DIAGNOSIS — I890 Lymphedema, not elsewhere classified: Secondary | ICD-10-CM | POA: Diagnosis not present

## 2021-01-21 DIAGNOSIS — I10 Essential (primary) hypertension: Secondary | ICD-10-CM | POA: Diagnosis not present

## 2021-01-21 DIAGNOSIS — K219 Gastro-esophageal reflux disease without esophagitis: Secondary | ICD-10-CM | POA: Diagnosis not present

## 2021-02-04 DIAGNOSIS — I10 Essential (primary) hypertension: Secondary | ICD-10-CM | POA: Diagnosis not present

## 2021-02-07 ENCOUNTER — Other Ambulatory Visit: Payer: Self-pay | Admitting: Cardiology

## 2021-02-24 ENCOUNTER — Other Ambulatory Visit: Payer: Self-pay | Admitting: Cardiology

## 2021-02-26 ENCOUNTER — Other Ambulatory Visit: Payer: Self-pay | Admitting: Cardiology

## 2021-03-25 ENCOUNTER — Other Ambulatory Visit: Payer: Self-pay | Admitting: Cardiology

## 2021-03-27 DIAGNOSIS — Z87891 Personal history of nicotine dependence: Secondary | ICD-10-CM | POA: Diagnosis not present

## 2021-03-27 DIAGNOSIS — I890 Lymphedema, not elsewhere classified: Secondary | ICD-10-CM | POA: Diagnosis not present

## 2021-03-27 DIAGNOSIS — N1831 Chronic kidney disease, stage 3a: Secondary | ICD-10-CM | POA: Diagnosis not present

## 2021-03-27 DIAGNOSIS — J309 Allergic rhinitis, unspecified: Secondary | ICD-10-CM | POA: Diagnosis not present

## 2021-03-27 DIAGNOSIS — D464 Refractory anemia, unspecified: Secondary | ICD-10-CM | POA: Diagnosis not present

## 2021-03-27 DIAGNOSIS — K219 Gastro-esophageal reflux disease without esophagitis: Secondary | ICD-10-CM | POA: Diagnosis not present

## 2021-03-27 DIAGNOSIS — C50919 Malignant neoplasm of unspecified site of unspecified female breast: Secondary | ICD-10-CM | POA: Diagnosis not present

## 2021-03-27 DIAGNOSIS — M170 Bilateral primary osteoarthritis of knee: Secondary | ICD-10-CM | POA: Diagnosis not present

## 2021-03-27 DIAGNOSIS — I509 Heart failure, unspecified: Secondary | ICD-10-CM | POA: Diagnosis not present

## 2021-03-27 DIAGNOSIS — I10 Essential (primary) hypertension: Secondary | ICD-10-CM | POA: Diagnosis not present

## 2021-03-27 DIAGNOSIS — R519 Headache, unspecified: Secondary | ICD-10-CM | POA: Diagnosis not present

## 2021-03-27 DIAGNOSIS — Z888 Allergy status to other drugs, medicaments and biological substances status: Secondary | ICD-10-CM | POA: Diagnosis not present

## 2021-03-28 ENCOUNTER — Other Ambulatory Visit: Payer: Self-pay | Admitting: Cardiology

## 2021-03-28 DIAGNOSIS — E782 Mixed hyperlipidemia: Secondary | ICD-10-CM

## 2021-04-02 DIAGNOSIS — J309 Allergic rhinitis, unspecified: Secondary | ICD-10-CM | POA: Diagnosis not present

## 2021-04-02 DIAGNOSIS — I509 Heart failure, unspecified: Secondary | ICD-10-CM | POA: Diagnosis not present

## 2021-04-02 DIAGNOSIS — Z87891 Personal history of nicotine dependence: Secondary | ICD-10-CM | POA: Diagnosis not present

## 2021-04-02 DIAGNOSIS — C50919 Malignant neoplasm of unspecified site of unspecified female breast: Secondary | ICD-10-CM | POA: Diagnosis not present

## 2021-04-02 DIAGNOSIS — R519 Headache, unspecified: Secondary | ICD-10-CM | POA: Diagnosis not present

## 2021-04-02 DIAGNOSIS — D464 Refractory anemia, unspecified: Secondary | ICD-10-CM | POA: Diagnosis not present

## 2021-04-02 DIAGNOSIS — I10 Essential (primary) hypertension: Secondary | ICD-10-CM | POA: Diagnosis not present

## 2021-04-02 DIAGNOSIS — I890 Lymphedema, not elsewhere classified: Secondary | ICD-10-CM | POA: Diagnosis not present

## 2021-04-02 DIAGNOSIS — Z888 Allergy status to other drugs, medicaments and biological substances status: Secondary | ICD-10-CM | POA: Diagnosis not present

## 2021-04-02 DIAGNOSIS — M170 Bilateral primary osteoarthritis of knee: Secondary | ICD-10-CM | POA: Diagnosis not present

## 2021-04-02 DIAGNOSIS — K219 Gastro-esophageal reflux disease without esophagitis: Secondary | ICD-10-CM | POA: Diagnosis not present

## 2021-04-02 DIAGNOSIS — N1831 Chronic kidney disease, stage 3a: Secondary | ICD-10-CM | POA: Diagnosis not present

## 2021-04-09 DIAGNOSIS — J309 Allergic rhinitis, unspecified: Secondary | ICD-10-CM | POA: Diagnosis not present

## 2021-04-09 DIAGNOSIS — N1831 Chronic kidney disease, stage 3a: Secondary | ICD-10-CM | POA: Diagnosis not present

## 2021-04-09 DIAGNOSIS — Z87891 Personal history of nicotine dependence: Secondary | ICD-10-CM | POA: Diagnosis not present

## 2021-04-09 DIAGNOSIS — C50919 Malignant neoplasm of unspecified site of unspecified female breast: Secondary | ICD-10-CM | POA: Diagnosis not present

## 2021-04-09 DIAGNOSIS — D464 Refractory anemia, unspecified: Secondary | ICD-10-CM | POA: Diagnosis not present

## 2021-04-09 DIAGNOSIS — R519 Headache, unspecified: Secondary | ICD-10-CM | POA: Diagnosis not present

## 2021-04-09 DIAGNOSIS — I890 Lymphedema, not elsewhere classified: Secondary | ICD-10-CM | POA: Diagnosis not present

## 2021-04-09 DIAGNOSIS — K219 Gastro-esophageal reflux disease without esophagitis: Secondary | ICD-10-CM | POA: Diagnosis not present

## 2021-04-09 DIAGNOSIS — M170 Bilateral primary osteoarthritis of knee: Secondary | ICD-10-CM | POA: Diagnosis not present

## 2021-04-09 DIAGNOSIS — I509 Heart failure, unspecified: Secondary | ICD-10-CM | POA: Diagnosis not present

## 2021-04-09 DIAGNOSIS — Z888 Allergy status to other drugs, medicaments and biological substances status: Secondary | ICD-10-CM | POA: Diagnosis not present

## 2021-04-16 DIAGNOSIS — R519 Headache, unspecified: Secondary | ICD-10-CM | POA: Diagnosis not present

## 2021-04-16 DIAGNOSIS — J309 Allergic rhinitis, unspecified: Secondary | ICD-10-CM | POA: Diagnosis not present

## 2021-04-16 DIAGNOSIS — Z888 Allergy status to other drugs, medicaments and biological substances status: Secondary | ICD-10-CM | POA: Diagnosis not present

## 2021-04-16 DIAGNOSIS — C50919 Malignant neoplasm of unspecified site of unspecified female breast: Secondary | ICD-10-CM | POA: Diagnosis not present

## 2021-04-16 DIAGNOSIS — I890 Lymphedema, not elsewhere classified: Secondary | ICD-10-CM | POA: Diagnosis not present

## 2021-04-16 DIAGNOSIS — N1831 Chronic kidney disease, stage 3a: Secondary | ICD-10-CM | POA: Diagnosis not present

## 2021-04-16 DIAGNOSIS — M170 Bilateral primary osteoarthritis of knee: Secondary | ICD-10-CM | POA: Diagnosis not present

## 2021-04-16 DIAGNOSIS — K219 Gastro-esophageal reflux disease without esophagitis: Secondary | ICD-10-CM | POA: Diagnosis not present

## 2021-04-16 DIAGNOSIS — I509 Heart failure, unspecified: Secondary | ICD-10-CM | POA: Diagnosis not present

## 2021-04-16 DIAGNOSIS — D464 Refractory anemia, unspecified: Secondary | ICD-10-CM | POA: Diagnosis not present

## 2021-04-16 DIAGNOSIS — Z87891 Personal history of nicotine dependence: Secondary | ICD-10-CM | POA: Diagnosis not present

## 2021-04-21 ENCOUNTER — Other Ambulatory Visit: Payer: Self-pay | Admitting: Cardiology

## 2021-04-21 DIAGNOSIS — I5042 Chronic combined systolic (congestive) and diastolic (congestive) heart failure: Secondary | ICD-10-CM

## 2021-04-23 ENCOUNTER — Other Ambulatory Visit: Payer: Self-pay | Admitting: Cardiology

## 2021-04-27 DIAGNOSIS — N1831 Chronic kidney disease, stage 3a: Secondary | ICD-10-CM | POA: Diagnosis not present

## 2021-05-14 DIAGNOSIS — D464 Refractory anemia, unspecified: Secondary | ICD-10-CM | POA: Diagnosis not present

## 2021-05-14 DIAGNOSIS — N1831 Chronic kidney disease, stage 3a: Secondary | ICD-10-CM | POA: Diagnosis not present

## 2021-05-14 DIAGNOSIS — R519 Headache, unspecified: Secondary | ICD-10-CM | POA: Diagnosis not present

## 2021-05-14 DIAGNOSIS — J309 Allergic rhinitis, unspecified: Secondary | ICD-10-CM | POA: Diagnosis not present

## 2021-05-14 DIAGNOSIS — K219 Gastro-esophageal reflux disease without esophagitis: Secondary | ICD-10-CM | POA: Diagnosis not present

## 2021-05-14 DIAGNOSIS — I890 Lymphedema, not elsewhere classified: Secondary | ICD-10-CM | POA: Diagnosis not present

## 2021-05-14 DIAGNOSIS — I509 Heart failure, unspecified: Secondary | ICD-10-CM | POA: Diagnosis not present

## 2021-05-14 DIAGNOSIS — Z87891 Personal history of nicotine dependence: Secondary | ICD-10-CM | POA: Diagnosis not present

## 2021-05-14 DIAGNOSIS — C50919 Malignant neoplasm of unspecified site of unspecified female breast: Secondary | ICD-10-CM | POA: Diagnosis not present

## 2021-05-14 DIAGNOSIS — M170 Bilateral primary osteoarthritis of knee: Secondary | ICD-10-CM | POA: Diagnosis not present

## 2021-05-14 DIAGNOSIS — L309 Dermatitis, unspecified: Secondary | ICD-10-CM | POA: Diagnosis not present

## 2021-05-14 DIAGNOSIS — I10 Essential (primary) hypertension: Secondary | ICD-10-CM | POA: Diagnosis not present

## 2021-05-19 DIAGNOSIS — E875 Hyperkalemia: Secondary | ICD-10-CM | POA: Diagnosis not present

## 2021-05-26 DIAGNOSIS — E875 Hyperkalemia: Secondary | ICD-10-CM | POA: Diagnosis not present

## 2021-06-05 DIAGNOSIS — B359 Dermatophytosis, unspecified: Secondary | ICD-10-CM | POA: Diagnosis not present

## 2021-06-05 DIAGNOSIS — I10 Essential (primary) hypertension: Secondary | ICD-10-CM | POA: Diagnosis not present

## 2021-06-11 DIAGNOSIS — J309 Allergic rhinitis, unspecified: Secondary | ICD-10-CM | POA: Diagnosis not present

## 2021-06-11 DIAGNOSIS — M170 Bilateral primary osteoarthritis of knee: Secondary | ICD-10-CM | POA: Diagnosis not present

## 2021-06-11 DIAGNOSIS — I509 Heart failure, unspecified: Secondary | ICD-10-CM | POA: Diagnosis not present

## 2021-06-11 DIAGNOSIS — N1831 Chronic kidney disease, stage 3a: Secondary | ICD-10-CM | POA: Diagnosis not present

## 2021-06-11 DIAGNOSIS — I890 Lymphedema, not elsewhere classified: Secondary | ICD-10-CM | POA: Diagnosis not present

## 2021-06-11 DIAGNOSIS — Z87891 Personal history of nicotine dependence: Secondary | ICD-10-CM | POA: Diagnosis not present

## 2021-06-11 DIAGNOSIS — C50919 Malignant neoplasm of unspecified site of unspecified female breast: Secondary | ICD-10-CM | POA: Diagnosis not present

## 2021-06-11 DIAGNOSIS — R519 Headache, unspecified: Secondary | ICD-10-CM | POA: Diagnosis not present

## 2021-06-11 DIAGNOSIS — K219 Gastro-esophageal reflux disease without esophagitis: Secondary | ICD-10-CM | POA: Diagnosis not present

## 2021-06-11 DIAGNOSIS — D464 Refractory anemia, unspecified: Secondary | ICD-10-CM | POA: Diagnosis not present

## 2021-06-11 DIAGNOSIS — L509 Urticaria, unspecified: Secondary | ICD-10-CM | POA: Diagnosis not present

## 2021-06-19 DIAGNOSIS — I10 Essential (primary) hypertension: Secondary | ICD-10-CM | POA: Diagnosis not present

## 2021-06-19 DIAGNOSIS — R609 Edema, unspecified: Secondary | ICD-10-CM | POA: Diagnosis not present

## 2021-06-19 DIAGNOSIS — I509 Heart failure, unspecified: Secondary | ICD-10-CM | POA: Diagnosis not present

## 2021-06-20 ENCOUNTER — Other Ambulatory Visit: Payer: Self-pay | Admitting: Cardiology

## 2021-06-20 DIAGNOSIS — E782 Mixed hyperlipidemia: Secondary | ICD-10-CM

## 2021-06-24 DIAGNOSIS — K648 Other hemorrhoids: Secondary | ICD-10-CM | POA: Diagnosis not present

## 2021-06-24 DIAGNOSIS — K5900 Constipation, unspecified: Secondary | ICD-10-CM | POA: Diagnosis not present

## 2021-06-24 DIAGNOSIS — F17210 Nicotine dependence, cigarettes, uncomplicated: Secondary | ICD-10-CM | POA: Diagnosis not present

## 2021-06-24 DIAGNOSIS — K5904 Chronic idiopathic constipation: Secondary | ICD-10-CM | POA: Diagnosis not present

## 2021-06-24 DIAGNOSIS — R109 Unspecified abdominal pain: Secondary | ICD-10-CM | POA: Diagnosis not present

## 2021-06-24 DIAGNOSIS — J449 Chronic obstructive pulmonary disease, unspecified: Secondary | ICD-10-CM | POA: Diagnosis not present

## 2021-06-24 DIAGNOSIS — I10 Essential (primary) hypertension: Secondary | ICD-10-CM | POA: Diagnosis not present

## 2021-06-24 DIAGNOSIS — K6289 Other specified diseases of anus and rectum: Secondary | ICD-10-CM | POA: Diagnosis not present

## 2021-06-24 DIAGNOSIS — R58 Hemorrhage, not elsewhere classified: Secondary | ICD-10-CM | POA: Diagnosis not present

## 2021-06-24 DIAGNOSIS — K5641 Fecal impaction: Secondary | ICD-10-CM | POA: Diagnosis not present

## 2021-06-27 ENCOUNTER — Other Ambulatory Visit: Payer: Self-pay | Admitting: Cardiology

## 2021-06-30 DIAGNOSIS — K5903 Drug induced constipation: Secondary | ICD-10-CM | POA: Diagnosis not present

## 2021-06-30 DIAGNOSIS — R1032 Left lower quadrant pain: Secondary | ICD-10-CM | POA: Diagnosis not present

## 2021-06-30 DIAGNOSIS — K5900 Constipation, unspecified: Secondary | ICD-10-CM | POA: Diagnosis not present

## 2021-06-30 DIAGNOSIS — K921 Melena: Secondary | ICD-10-CM | POA: Diagnosis not present

## 2021-06-30 DIAGNOSIS — Z1212 Encounter for screening for malignant neoplasm of rectum: Secondary | ICD-10-CM | POA: Diagnosis not present

## 2021-06-30 DIAGNOSIS — T402X5A Adverse effect of other opioids, initial encounter: Secondary | ICD-10-CM | POA: Diagnosis not present

## 2021-07-09 DIAGNOSIS — N1831 Chronic kidney disease, stage 3a: Secondary | ICD-10-CM | POA: Diagnosis not present

## 2021-07-09 DIAGNOSIS — Z87891 Personal history of nicotine dependence: Secondary | ICD-10-CM | POA: Diagnosis not present

## 2021-07-09 DIAGNOSIS — K219 Gastro-esophageal reflux disease without esophagitis: Secondary | ICD-10-CM | POA: Diagnosis not present

## 2021-07-09 DIAGNOSIS — M170 Bilateral primary osteoarthritis of knee: Secondary | ICD-10-CM | POA: Diagnosis not present

## 2021-07-09 DIAGNOSIS — I890 Lymphedema, not elsewhere classified: Secondary | ICD-10-CM | POA: Diagnosis not present

## 2021-07-09 DIAGNOSIS — Z888 Allergy status to other drugs, medicaments and biological substances status: Secondary | ICD-10-CM | POA: Diagnosis not present

## 2021-07-09 DIAGNOSIS — C50919 Malignant neoplasm of unspecified site of unspecified female breast: Secondary | ICD-10-CM | POA: Diagnosis not present

## 2021-07-09 DIAGNOSIS — R519 Headache, unspecified: Secondary | ICD-10-CM | POA: Diagnosis not present

## 2021-07-09 DIAGNOSIS — D464 Refractory anemia, unspecified: Secondary | ICD-10-CM | POA: Diagnosis not present

## 2021-07-09 DIAGNOSIS — I509 Heart failure, unspecified: Secondary | ICD-10-CM | POA: Diagnosis not present

## 2021-07-09 DIAGNOSIS — J309 Allergic rhinitis, unspecified: Secondary | ICD-10-CM | POA: Diagnosis not present

## 2021-07-16 DIAGNOSIS — I509 Heart failure, unspecified: Secondary | ICD-10-CM | POA: Diagnosis not present

## 2021-07-16 DIAGNOSIS — D464 Refractory anemia, unspecified: Secondary | ICD-10-CM | POA: Diagnosis not present

## 2021-07-16 DIAGNOSIS — Z888 Allergy status to other drugs, medicaments and biological substances status: Secondary | ICD-10-CM | POA: Diagnosis not present

## 2021-07-16 DIAGNOSIS — M170 Bilateral primary osteoarthritis of knee: Secondary | ICD-10-CM | POA: Diagnosis not present

## 2021-07-16 DIAGNOSIS — I890 Lymphedema, not elsewhere classified: Secondary | ICD-10-CM | POA: Diagnosis not present

## 2021-07-16 DIAGNOSIS — C50919 Malignant neoplasm of unspecified site of unspecified female breast: Secondary | ICD-10-CM | POA: Diagnosis not present

## 2021-07-16 DIAGNOSIS — Z87891 Personal history of nicotine dependence: Secondary | ICD-10-CM | POA: Diagnosis not present

## 2021-07-16 DIAGNOSIS — K219 Gastro-esophageal reflux disease without esophagitis: Secondary | ICD-10-CM | POA: Diagnosis not present

## 2021-07-16 DIAGNOSIS — N1831 Chronic kidney disease, stage 3a: Secondary | ICD-10-CM | POA: Diagnosis not present

## 2021-07-16 DIAGNOSIS — J309 Allergic rhinitis, unspecified: Secondary | ICD-10-CM | POA: Diagnosis not present

## 2021-07-16 DIAGNOSIS — E7800 Pure hypercholesterolemia, unspecified: Secondary | ICD-10-CM | POA: Diagnosis not present

## 2021-07-16 DIAGNOSIS — R519 Headache, unspecified: Secondary | ICD-10-CM | POA: Diagnosis not present

## 2021-07-18 ENCOUNTER — Other Ambulatory Visit: Payer: Self-pay | Admitting: Cardiology

## 2021-07-21 ENCOUNTER — Other Ambulatory Visit: Payer: Self-pay

## 2021-07-21 MED ORDER — HYDRALAZINE HCL 50 MG PO TABS: 50.0000 mg | ORAL_TABLET | Freq: Three times a day (TID) | ORAL | 0 refills | Status: DC

## 2021-07-21 MED ORDER — NITROGLYCERIN 0.4 MG SL SUBL: 0.4000 mg | SUBLINGUAL_TABLET | SUBLINGUAL | 4 refills | Status: DC | PRN

## 2021-07-23 ENCOUNTER — Other Ambulatory Visit: Payer: Self-pay | Admitting: Cardiology

## 2021-07-23 DIAGNOSIS — I5042 Chronic combined systolic (congestive) and diastolic (congestive) heart failure: Secondary | ICD-10-CM

## 2021-08-03 ENCOUNTER — Ambulatory Visit: Payer: Medicare Other | Admitting: Cardiology

## 2021-08-04 ENCOUNTER — Encounter: Payer: Self-pay | Admitting: Cardiology

## 2021-08-04 ENCOUNTER — Ambulatory Visit: Payer: Medicare Other | Admitting: Cardiology

## 2021-08-04 VITALS — BP 112/65 | HR 64 | Temp 98.9°F | Resp 16 | Ht <= 58 in | Wt 195.2 lb

## 2021-08-04 DIAGNOSIS — I5042 Chronic combined systolic (congestive) and diastolic (congestive) heart failure: Secondary | ICD-10-CM

## 2021-08-04 DIAGNOSIS — F17200 Nicotine dependence, unspecified, uncomplicated: Secondary | ICD-10-CM | POA: Diagnosis not present

## 2021-08-04 DIAGNOSIS — I428 Other cardiomyopathies: Secondary | ICD-10-CM | POA: Diagnosis not present

## 2021-08-04 DIAGNOSIS — I10 Essential (primary) hypertension: Secondary | ICD-10-CM | POA: Diagnosis not present

## 2021-08-04 NOTE — Progress Notes (Signed)
? ?Primary Physician/Referring:  Cher Nakai, MD ? ?Patient ID: [name], female    DOB: 05/30/1960, 61 y.o.   MRN: [id number] ? ?Chief Complaint  ?Patient presents with  ? Chest Pain  ? Shortness of Breath  ? Results  ? Follow-up  ?  4 weeks  ? ?HPI:   ? ?[name]  is a 61 y.o. African-American female patient was diagnosed with nonischemic cardiomyopathy diagnosed in May 2012, had normal coronary arteries and EF 30 to 35% however her last echocardiogram in 2016 had revealed LVEF of 55%, hypertension, hyperlipidemia, morbid obesity, breast invasive ductal carcinoma SP lumpectomy on 07/16/2011 followed by chemotherapy and radiation therapy to the chest, tobacco use disorder, stage IIIa chronic kidney disease, presents for annual visit, states that she has been having difficulty in losing weight and has continued to smoke as she gained even more weight when she quit smoking. ? ?She continues to have marked dyspnea on exertion, denies PND or orthopnea.  She has not had any chest pain or chest tightness.  She has been compliant with all her medications.  She is frustrated that she is unable to lose weight. ? ?Past Medical History:  ?Diagnosis Date  ? Anemia 05/19/2011  ? "w/cancer tx"  ? Animal dander allergy   ? "cats and dogs"  ? Anxiety   ? Arthritis   ? "lower back" (06/28/2016)  ? Breast cancer ([place]) 07/16/2011  ? left breast lumpectomy=invasive ductal ca,2. cm,high grade ductal ca in situ,11 benign lymph nodes  ? Chest pain, unspecified   ? Sharp midsternal chest pain at night with no radiation. Hard for her to take a deep breath in. NTG does help. Normal Myocardial perfusion study 05/14/11.  ? Chronic combined systolic and diastolic CHF (congestive heart failure) ([place])   ? Chronic lower back pain   ? Chronic lower back pain   ? Congestive heart failure ([place])   ? Coronary artery disease   ? Negative cath 2001  ? Daily headache   ? "just recently; I'm not used to having headaches" (06/28/2016)  ? Depression   ?  Emphysema of lung ([place])   ? GERD (gastroesophageal reflux disease)   ? Heart murmur   ? History of blood transfusion 2013  ? "w/cancer tx"  ? History of bronchitis   ? History of radiation therapy 09/06/11-11/24/11  ? left breast  60.4gray total dose  ? Hyperlipidemia   ? Hypertension   ? Hypothyroidism   ? Myocardial infarction [facility]) 2003  ? Neuromuscular disorder ([place])   ? tingling left hand  ? Nonischemic cardiomyopathy ([place])   ? LVEF 38%. MUGA, 2010, 50-55% by ECHO 04/14/11. Cardiologist Dr. [name]  ? Obesity   ? Other primary cardiomyopathies   ? Shortness of breath 07/16/11  ? "at all times"  ? Urticaria   ? ?Past Surgical History:  ?Procedure Laterality Date  ? ABDOMINAL HYSTERECTOMY  ~2006  ? partial  ? BREAST BIOPSY Left   ? BREAST LUMPECTOMY Left 07/16/11  ? w/LND; left.ER/PR=positive  ? CARDIAC CATHETERIZATION  2001  ? negative for CAD  ? CATARACT EXTRACTION, BILATERAL Bilateral 10/17/2018  ? 10/28/2018- Right 10/17/2018-left eye  ? [place]; 1985; 1991  ? PORT-A-CATH REMOVAL N/A 07/31/2012  ? Procedure: REMOVAL PORT-A-CATH;  Surgeon: Harl Bowie, MD;  Location: [facility];  Service: General;  Laterality: N/A;  ? PORTACATH PLACEMENT Right 02/26/2011  ? Procedure: INSERTION PORT-A-CATH;  Surgeon: Harl Bowie, MD;  Location: [facility];  Service: General;  Laterality: Right;  ? TUBAL LIGATION  1991  ? ?Family History  ?Problem Relation Age of Onset  ? Leukemia Mother   ? Kidney failure Father   ? Heart disease Maternal Grandmother   ? Allergic rhinitis Grandchild   ? Asthma Grandchild   ? Eczema Grandchild   ? Hypertension Brother   ? Hyperlipidemia Brother   ? Hypertension Brother   ?  ?Social History  ? ?Tobacco Use  ? Smoking status: Every Day  ?  Packs/day: 0.25  ?  Years: 30.00  ?  Pack years: 7.50  ?  Types: Cigarettes  ? Smokeless tobacco: Never  ?Substance Use Topics  ? Alcohol use: Not Currently  ?  Comment: 06/28/2016 "don't drink anything anymroe"  ? ?Marital Status: Divorced   ?ROS  ?Review of Systems  ?Cardiovascular:  Positive for dyspnea on exertion and leg swelling. Negative for chest pain.  ?Objective  ?Blood pressure 112/65, pulse 64, temperature 98.9 ?F (37.2 ?C), temperature source Temporal, resp. rate 16, height '4\' 9"'$  (1.448 m), weight 195 lb 3.2 oz (88.5 kg), SpO2 97 %. Body mass index is 42.24 kg/m?.  ? ?  08/04/2021  ?  3:31 PM 06/24/2020  ?  2:46 PM 05/31/2019  ?  2:02 PM  ?Vitals with BMI  ?Height '4\' 9"'$  '4\' 9"'$  '4\' 9"'$   ?Weight 195 lbs 3 oz 191 lbs 184 lbs  ?BMI 42.23 41.32 39.81  ?Systolic 283 662   ?Diastolic 65 83   ?Pulse 64 70   ?  ?Physical Exam ?Constitutional:   ?   Appearance: She is obese.  ?Neck:  ?   Vascular: No JVD.  ?Cardiovascular:  ?   Rate and Rhythm: Normal rate and regular rhythm.  ?   Pulses: Intact distal pulses.  ?   Heart sounds: Normal heart sounds. No murmur heard. ?  No gallop.  ?Pulmonary:  ?   Effort: Pulmonary effort is normal.  ?   Breath sounds: Normal breath sounds.  ?Abdominal:  ?   General: Bowel sounds are normal.  ?   Palpations: Abdomen is soft.  ?Musculoskeletal:  ?   Right lower leg: Edema (2 + below-knee pitting edema) present.  ?   Left lower leg: Edema (2 + below-knee pitting edema) present.  ? ? ?Medications and allergies  ? ?Allergies  ?Allergen Reactions  ? Aspirin Hives  ? Docetaxel Other (See Comments)  ?  Chest tightness and pain  ? Morphine And Related Hives and Swelling  ? Penicillins Hives  ?  Has patient had a PCN reaction causing immediate rash, facial/tongue/throat swelling, SOB or lightheadedness with hypotension: Yes ?Has patient had a PCN reaction causing severe rash involving mucus membranes or skin necrosis: No ?Has patient had a PCN reaction that required hospitalization No ?Has patient had a PCN reaction occurring within the last 10 years: Yes ?If all of the above answers are "NO", then may proceed with Cephalosporin use.  ? Clindamycin/Lincomycin Hives  ? Dairy Aid [Tilactase] Hives  ? Lipitor [Atorvastatin] Hives  ?  Lisinopril Hives  ? Other Hives  ?  Reaction to cats and dogs  ? Peanuts [Peanut Oil] Hives  ?  ? ?Medication list after today's encounter  ? ?Current Outpatient Medications:  ?  amLODipine (NORVASC) 5 MG tablet, TAKE 1 TABLET BY MOUTH DAILY, Disp: 90 tablet, Rfl: 0 ?  anastrozole (ARIMIDEX) 1 MG tablet, Take 1 tablet (1 mg total) by mouth daily., Disp: 90 tablet, Rfl: 1 ?  budesonide (RHINOCORT AQUA) 32 MCG/ACT nasal spray, Place 1 spray into both nostrils daily as needed for rhinitis or allergies. , Disp: , Rfl:  ?  budesonide-formoterol (SYMBICORT) 160-4.5 MCG/ACT inhaler, Inhale two puffs twice daily as directed.  Rinse, gargle, and spit after use., Disp: 1 Inhaler, Rfl: 11 ?  diphenhydrAMINE (BENADRYL) 25 MG tablet, Take 37.5 mg by mouth daily as needed for itching., Disp: , Rfl:  ?  diphenhydramine-acetaminophen (TYLENOL PM) 25-500 MG TABS, Take 3 tablets by mouth at bedtime., Disp: , Rfl:  ?  fexofenadine (ALLEGRA) 180 MG tablet, Take 1 tablet (180 mg total) by mouth daily., Disp: 30 tablet, Rfl: 11 ?  furosemide (LASIX) 20 MG tablet, Take one tablet by mouth in the morning, Disp: 90 tablet, Rfl: 0 ?  hydrALAZINE (APRESOLINE) 50 MG tablet, Take 1 tablet (50 mg total) by mouth 3 (three) times daily., Disp: 270 tablet, Rfl: 0 ?  hydrOXYzine (ATARAX/VISTARIL) 25 MG tablet, Take 25 mg by mouth 4 (four) times daily., Disp: , Rfl:  ?  isosorbide mononitrate (IMDUR) 60 MG 24 hr tablet, TAKE ONE TABLET BY MOUTH DAILY, Disp: 90 tablet, Rfl: 1 ?  levothyroxine (SYNTHROID, LEVOTHROID) 88 MCG tablet, Take 88 mcg by mouth daily before breakfast., Disp: , Rfl:  ?  metoprolol (TOPROL-XL) 200 MG 24 hr tablet, TAke 1 tablet by mouth once daily, Disp: 90 tablet, Rfl: 0 ?  montelukast (SINGULAIR) 10 MG tablet, Take 1 tablet (10 mg total) by mouth daily., Disp: 30 tablet, Rfl: 11 ?  nitroGLYCERIN (NITROSTAT) 0.4 MG SL tablet, Place 1 tablet (0.4 mg total) under the tongue every 5 (five) minutes as needed for chest pain., Disp:  25 tablet, Rfl: 4 ?  omeprazole (PRILOSEC) 20 MG capsule, Take 20 mg by mouth 2 (two) times daily., Disp: , Rfl:  ?  oxyCODONE-acetaminophen (PERCOCET) 7.5-325 MG tablet, Take 1 tablet by mouth., Disp: , Rfl:  ?  p

## 2021-08-05 ENCOUNTER — Other Ambulatory Visit: Payer: Self-pay | Admitting: Cardiology

## 2021-08-06 DIAGNOSIS — Z87891 Personal history of nicotine dependence: Secondary | ICD-10-CM | POA: Diagnosis not present

## 2021-08-06 DIAGNOSIS — R519 Headache, unspecified: Secondary | ICD-10-CM | POA: Diagnosis not present

## 2021-08-06 DIAGNOSIS — I10 Essential (primary) hypertension: Secondary | ICD-10-CM | POA: Diagnosis not present

## 2021-08-06 DIAGNOSIS — C50919 Malignant neoplasm of unspecified site of unspecified female breast: Secondary | ICD-10-CM | POA: Diagnosis not present

## 2021-08-06 DIAGNOSIS — M170 Bilateral primary osteoarthritis of knee: Secondary | ICD-10-CM | POA: Diagnosis not present

## 2021-08-06 DIAGNOSIS — J309 Allergic rhinitis, unspecified: Secondary | ICD-10-CM | POA: Diagnosis not present

## 2021-08-06 DIAGNOSIS — D464 Refractory anemia, unspecified: Secondary | ICD-10-CM | POA: Diagnosis not present

## 2021-08-06 DIAGNOSIS — I890 Lymphedema, not elsewhere classified: Secondary | ICD-10-CM | POA: Diagnosis not present

## 2021-08-06 DIAGNOSIS — K219 Gastro-esophageal reflux disease without esophagitis: Secondary | ICD-10-CM | POA: Diagnosis not present

## 2021-08-06 DIAGNOSIS — N1831 Chronic kidney disease, stage 3a: Secondary | ICD-10-CM | POA: Diagnosis not present

## 2021-08-06 DIAGNOSIS — I509 Heart failure, unspecified: Secondary | ICD-10-CM | POA: Diagnosis not present

## 2021-08-25 ENCOUNTER — Ambulatory Visit: Payer: Medicare Other | Admitting: Allergy

## 2021-09-03 DIAGNOSIS — I10 Essential (primary) hypertension: Secondary | ICD-10-CM | POA: Diagnosis not present

## 2021-09-03 DIAGNOSIS — D464 Refractory anemia, unspecified: Secondary | ICD-10-CM | POA: Diagnosis not present

## 2021-09-03 DIAGNOSIS — M170 Bilateral primary osteoarthritis of knee: Secondary | ICD-10-CM | POA: Diagnosis not present

## 2021-09-03 DIAGNOSIS — I509 Heart failure, unspecified: Secondary | ICD-10-CM | POA: Diagnosis not present

## 2021-09-03 DIAGNOSIS — C50919 Malignant neoplasm of unspecified site of unspecified female breast: Secondary | ICD-10-CM | POA: Diagnosis not present

## 2021-09-03 DIAGNOSIS — Z87891 Personal history of nicotine dependence: Secondary | ICD-10-CM | POA: Diagnosis not present

## 2021-09-03 DIAGNOSIS — J309 Allergic rhinitis, unspecified: Secondary | ICD-10-CM | POA: Diagnosis not present

## 2021-09-03 DIAGNOSIS — K219 Gastro-esophageal reflux disease without esophagitis: Secondary | ICD-10-CM | POA: Diagnosis not present

## 2021-09-03 DIAGNOSIS — R519 Headache, unspecified: Secondary | ICD-10-CM | POA: Diagnosis not present

## 2021-09-03 DIAGNOSIS — I890 Lymphedema, not elsewhere classified: Secondary | ICD-10-CM | POA: Diagnosis not present

## 2021-09-03 DIAGNOSIS — N1831 Chronic kidney disease, stage 3a: Secondary | ICD-10-CM | POA: Diagnosis not present

## 2021-09-07 ENCOUNTER — Ambulatory Visit: Payer: Medicare Other | Admitting: Cardiology

## 2021-09-19 ENCOUNTER — Other Ambulatory Visit: Payer: Self-pay | Admitting: Cardiology

## 2021-09-22 ENCOUNTER — Other Ambulatory Visit: Payer: Self-pay | Admitting: Cardiology

## 2021-10-01 DIAGNOSIS — Z87891 Personal history of nicotine dependence: Secondary | ICD-10-CM | POA: Diagnosis not present

## 2021-10-01 DIAGNOSIS — M170 Bilateral primary osteoarthritis of knee: Secondary | ICD-10-CM | POA: Diagnosis not present

## 2021-10-01 DIAGNOSIS — C50919 Malignant neoplasm of unspecified site of unspecified female breast: Secondary | ICD-10-CM | POA: Diagnosis not present

## 2021-10-01 DIAGNOSIS — K219 Gastro-esophageal reflux disease without esophagitis: Secondary | ICD-10-CM | POA: Diagnosis not present

## 2021-10-01 DIAGNOSIS — J309 Allergic rhinitis, unspecified: Secondary | ICD-10-CM | POA: Diagnosis not present

## 2021-10-01 DIAGNOSIS — I10 Essential (primary) hypertension: Secondary | ICD-10-CM | POA: Diagnosis not present

## 2021-10-01 DIAGNOSIS — I509 Heart failure, unspecified: Secondary | ICD-10-CM | POA: Diagnosis not present

## 2021-10-01 DIAGNOSIS — D464 Refractory anemia, unspecified: Secondary | ICD-10-CM | POA: Diagnosis not present

## 2021-10-01 DIAGNOSIS — J069 Acute upper respiratory infection, unspecified: Secondary | ICD-10-CM | POA: Diagnosis not present

## 2021-10-01 DIAGNOSIS — N1831 Chronic kidney disease, stage 3a: Secondary | ICD-10-CM | POA: Diagnosis not present

## 2021-10-17 ENCOUNTER — Other Ambulatory Visit: Payer: Self-pay | Admitting: Cardiology

## 2021-10-21 ENCOUNTER — Other Ambulatory Visit: Payer: Self-pay | Admitting: Cardiology

## 2021-10-21 DIAGNOSIS — I5042 Chronic combined systolic (congestive) and diastolic (congestive) heart failure: Secondary | ICD-10-CM

## 2021-10-27 ENCOUNTER — Ambulatory Visit: Payer: Medicare Other | Admitting: Allergy

## 2021-10-29 DIAGNOSIS — I10 Essential (primary) hypertension: Secondary | ICD-10-CM | POA: Diagnosis not present

## 2021-10-29 DIAGNOSIS — D464 Refractory anemia, unspecified: Secondary | ICD-10-CM | POA: Diagnosis not present

## 2021-10-29 DIAGNOSIS — K219 Gastro-esophageal reflux disease without esophagitis: Secondary | ICD-10-CM | POA: Diagnosis not present

## 2021-10-29 DIAGNOSIS — Z87891 Personal history of nicotine dependence: Secondary | ICD-10-CM | POA: Diagnosis not present

## 2021-10-29 DIAGNOSIS — C50919 Malignant neoplasm of unspecified site of unspecified female breast: Secondary | ICD-10-CM | POA: Diagnosis not present

## 2021-10-29 DIAGNOSIS — R519 Headache, unspecified: Secondary | ICD-10-CM | POA: Diagnosis not present

## 2021-10-29 DIAGNOSIS — I890 Lymphedema, not elsewhere classified: Secondary | ICD-10-CM | POA: Diagnosis not present

## 2021-10-29 DIAGNOSIS — J309 Allergic rhinitis, unspecified: Secondary | ICD-10-CM | POA: Diagnosis not present

## 2021-10-29 DIAGNOSIS — I509 Heart failure, unspecified: Secondary | ICD-10-CM | POA: Diagnosis not present

## 2021-10-29 DIAGNOSIS — N1831 Chronic kidney disease, stage 3a: Secondary | ICD-10-CM | POA: Diagnosis not present

## 2021-10-29 DIAGNOSIS — M170 Bilateral primary osteoarthritis of knee: Secondary | ICD-10-CM | POA: Diagnosis not present

## 2021-11-26 DIAGNOSIS — J309 Allergic rhinitis, unspecified: Secondary | ICD-10-CM | POA: Diagnosis not present

## 2021-11-26 DIAGNOSIS — K219 Gastro-esophageal reflux disease without esophagitis: Secondary | ICD-10-CM | POA: Diagnosis not present

## 2021-11-26 DIAGNOSIS — G4700 Insomnia, unspecified: Secondary | ICD-10-CM | POA: Diagnosis not present

## 2021-11-26 DIAGNOSIS — M159 Polyosteoarthritis, unspecified: Secondary | ICD-10-CM | POA: Diagnosis not present

## 2021-11-26 DIAGNOSIS — R5382 Chronic fatigue, unspecified: Secondary | ICD-10-CM | POA: Diagnosis not present

## 2021-11-26 DIAGNOSIS — I890 Lymphedema, not elsewhere classified: Secondary | ICD-10-CM | POA: Diagnosis not present

## 2021-11-26 DIAGNOSIS — I10 Essential (primary) hypertension: Secondary | ICD-10-CM | POA: Diagnosis not present

## 2021-11-26 DIAGNOSIS — N1831 Chronic kidney disease, stage 3a: Secondary | ICD-10-CM | POA: Diagnosis not present

## 2021-11-26 DIAGNOSIS — Z853 Personal history of malignant neoplasm of breast: Secondary | ICD-10-CM | POA: Diagnosis not present

## 2021-11-26 DIAGNOSIS — R519 Headache, unspecified: Secondary | ICD-10-CM | POA: Diagnosis not present

## 2021-11-26 DIAGNOSIS — I509 Heart failure, unspecified: Secondary | ICD-10-CM | POA: Diagnosis not present

## 2021-11-26 DIAGNOSIS — D464 Refractory anemia, unspecified: Secondary | ICD-10-CM | POA: Diagnosis not present

## 2021-12-02 DIAGNOSIS — I890 Lymphedema, not elsewhere classified: Secondary | ICD-10-CM | POA: Diagnosis not present

## 2021-12-02 DIAGNOSIS — M159 Polyosteoarthritis, unspecified: Secondary | ICD-10-CM | POA: Diagnosis not present

## 2021-12-02 DIAGNOSIS — R519 Headache, unspecified: Secondary | ICD-10-CM | POA: Diagnosis not present

## 2021-12-02 DIAGNOSIS — R42 Dizziness and giddiness: Secondary | ICD-10-CM | POA: Diagnosis not present

## 2021-12-02 DIAGNOSIS — K219 Gastro-esophageal reflux disease without esophagitis: Secondary | ICD-10-CM | POA: Diagnosis not present

## 2021-12-02 DIAGNOSIS — D464 Refractory anemia, unspecified: Secondary | ICD-10-CM | POA: Diagnosis not present

## 2021-12-02 DIAGNOSIS — N1831 Chronic kidney disease, stage 3a: Secondary | ICD-10-CM | POA: Diagnosis not present

## 2021-12-02 DIAGNOSIS — I10 Essential (primary) hypertension: Secondary | ICD-10-CM | POA: Diagnosis not present

## 2021-12-02 DIAGNOSIS — J309 Allergic rhinitis, unspecified: Secondary | ICD-10-CM | POA: Diagnosis not present

## 2021-12-02 DIAGNOSIS — I509 Heart failure, unspecified: Secondary | ICD-10-CM | POA: Diagnosis not present

## 2021-12-02 DIAGNOSIS — G4700 Insomnia, unspecified: Secondary | ICD-10-CM | POA: Diagnosis not present

## 2021-12-02 DIAGNOSIS — Z853 Personal history of malignant neoplasm of breast: Secondary | ICD-10-CM | POA: Diagnosis not present

## 2021-12-19 ENCOUNTER — Other Ambulatory Visit: Payer: Self-pay | Admitting: Cardiology

## 2021-12-25 ENCOUNTER — Other Ambulatory Visit: Payer: Self-pay | Admitting: Cardiology

## 2022-01-15 ENCOUNTER — Other Ambulatory Visit: Payer: Self-pay | Admitting: Cardiology

## 2022-01-16 ENCOUNTER — Other Ambulatory Visit: Payer: Self-pay | Admitting: Cardiology

## 2022-01-16 DIAGNOSIS — I5042 Chronic combined systolic (congestive) and diastolic (congestive) heart failure: Secondary | ICD-10-CM

## 2022-02-01 ENCOUNTER — Other Ambulatory Visit: Payer: Self-pay | Admitting: Cardiology

## 2022-03-09 NOTE — Progress Notes (Unsigned)
[facility]  PATIENT: [name] DOB: [birth year]  REFERRING CLINICIAN: Cher Nakai, MD HISTORY FROM: self, sister Santiago Glad REASON FOR VISIT: gait imbalance   HISTORICAL  CHIEF COMPLAINT:  Chief Complaint  Patient presents with   Gait Problem    Rm 1 with sister Santiago Glad  Pt is well, has been having imbalance for about 2-3 months. Having some popping in R knee. No onset triggers she is aware of.      HISTORY OF PRESENT ILLNESS:  The patient presents for evaluation of unsteadiness and weakness which has been present for the past 2-3 months. No clear inciting event for onset of her symptoms. Feels her vision is more blurry as well. She does note that she has had cataract surgery recently. She denies lightheadedness or vertigo. She reports significant lower back pain which will radiate down her right leg. Her leg sometimes feels like it will give out from under her, and she has persistent numbness in her second and third toes on the right side. Her right hand also feels numb. States these symptoms have also been going on for the past few months. She has lymphedema in her left hand.  She saw her PCP who noted left-sided weakness on exam and was concerned for possible CVA.  MRI L-spine in 2012 showed degenerative changes which were advanced for her age. She did have a small disc protrusion at left L5-S1 which came in contact with the nerve root.  OTHER MEDICAL CONDITIONS: CHF, MI, HTN, HLD, asthma, breast cancer, osteoarthritis   REVIEW OF SYSTEMS: Full 14 system review of systems performed and negative with exception of: imbalance  ALLERGIES: Allergies  Allergen Reactions   Aspirin Hives   Docetaxel Other (See Comments)    Chest tightness and pain   Morphine And Related Hives and Swelling   Penicillins Hives    Has patient had a PCN reaction causing immediate rash, facial/tongue/throat swelling, SOB or lightheadedness with hypotension: Yes Has patient had a PCN  reaction causing severe rash involving mucus membranes or skin necrosis: No Has patient had a PCN reaction that required hospitalization No Has patient had a PCN reaction occurring within the last 10 years: Yes If all of the above answers are "NO", then may proceed with Cephalosporin use.   Clindamycin/Lincomycin Hives   Dairy Aid [Tilactase] Hives   Lipitor [Atorvastatin] Hives   Lisinopril Hives   Morphine Other (See Comments)   Other Hives    Reaction to cats and dogs   Peanuts [Peanut Oil] Hives    HOME MEDICATIONS: Outpatient Medications Prior to Visit  Medication Sig Dispense Refill   amLODipine (NORVASC) 5 MG tablet TAKE 1 TABLET BY MOUTH EVERY DAY (Patient not taking: Reported on 03/10/2022) 90 tablet 0   anastrozole (ARIMIDEX) 1 MG tablet Take 1 tablet (1 mg total) by mouth daily. (Patient not taking: Reported on 03/10/2022) 90 tablet 1   budesonide (RHINOCORT AQUA) 32 MCG/ACT nasal spray Place 1 spray into both nostrils daily as needed for rhinitis or allergies.  (Patient not taking: Reported on 03/10/2022)     budesonide-formoterol (SYMBICORT) 160-4.5 MCG/ACT inhaler Inhale two puffs twice daily as directed.  Rinse, gargle, and spit after use. (Patient not taking: Reported on 03/10/2022) 1 Inhaler 11   diphenhydrAMINE (BENADRYL) 25 MG tablet Take 37.5 mg by mouth daily as needed for itching. (Patient not taking: Reported on 03/10/2022)     diphenhydramine-acetaminophen (TYLENOL PM) 25-500 MG TABS Take 3 tablets by mouth at bedtime. (Patient not  taking: Reported on 03/10/2022)     fexofenadine (ALLEGRA) 180 MG tablet Take 1 tablet (180 mg total) by mouth daily. (Patient not taking: Reported on 03/10/2022) 30 tablet 11   furosemide (LASIX) 20 MG tablet Take one tablet by mouth in the morning (Patient not taking: Reported on 03/10/2022) 90 tablet 0   hydrALAZINE (APRESOLINE) 50 MG tablet TAKE 1 TABLET BY MOUTH THREE TIMES A DAY (Patient not taking: Reported on 03/10/2022) 270 tablet  1   hydrOXYzine (ATARAX/VISTARIL) 25 MG tablet Take 25 mg by mouth 4 (four) times daily. (Patient not taking: Reported on 03/10/2022)     isosorbide mononitrate (IMDUR) 60 MG 24 hr tablet TAKE ONE TABLET BY MOUTH EVERY DAY (Patient not taking: Reported on 03/10/2022) 90 tablet 1   levothyroxine (SYNTHROID, LEVOTHROID) 88 MCG tablet Take 88 mcg by mouth daily before breakfast. (Patient not taking: Reported on 03/10/2022)     metoprolol (TOPROL-XL) 200 MG 24 hr tablet TAKE ONE TABLET BY MOUTH ONCE DAILY (Patient not taking: Reported on 03/10/2022) 90 tablet 0   montelukast (SINGULAIR) 10 MG tablet Take 1 tablet (10 mg total) by mouth daily. (Patient not taking: Reported on 03/10/2022) 30 tablet 11   nitroGLYCERIN (NITROSTAT) 0.4 MG SL tablet Place 1 tablet (0.4 mg total) under the tongue every 5 (five) minutes as needed for chest pain. (Patient not taking: Reported on 03/10/2022) 25 tablet 4   omeprazole (PRILOSEC) 20 MG capsule Take 20 mg by mouth 2 (two) times daily. (Patient not taking: Reported on 03/10/2022)     oxyCODONE-acetaminophen (PERCOCET) 7.5-325 MG tablet Take 1 tablet by mouth. (Patient not taking: Reported on 03/10/2022)     polyethylene glycol (MIRALAX / GLYCOLAX) packet Take 17 g by mouth daily. Mix in 8 oz liquid and drink (Patient not taking: Reported on 03/10/2022)     rosuvastatin (CRESTOR) 10 MG tablet TAKE ONE TABLET BY MOUTH AT BEDTIME (Patient not taking: Reported on 03/10/2022) 90 tablet 0   spironolactone (ALDACTONE) 50 MG tablet TAKE ONE TABLET BY MOUTH EVERY MORNING (Patient not taking: Reported on 03/10/2022) 90 tablet 0   zolpidem (AMBIEN) 10 MG tablet Take 10 mg by mouth at bedtime. (Patient not taking: Reported on 03/10/2022)     No facility-administered medications prior to visit.    PAST MEDICAL HISTORY: Past Medical History:  Diagnosis Date   Anemia 05/19/2011   "w/cancer tx"   Animal dander allergy    "cats and dogs"   Anxiety    Arthritis    "lower back"  (06/28/2016)   Breast cancer ([place]) 07/16/2011   left breast lumpectomy=invasive ductal ca,2. cm,high grade ductal ca in situ,11 benign lymph nodes   Chest pain, unspecified    Sharp midsternal chest pain at night with no radiation. Hard for her to take a deep breath in. NTG does help. Normal Myocardial perfusion study 05/14/11.   Chronic combined systolic and diastolic CHF (congestive heart failure) ([facility])    Chronic lower back pain    Chronic lower back pain    Congestive heart failure ([place])    Coronary artery disease    Negative cath 2001   Daily headache    "just recently; I'm not used to having headaches" (06/28/2016)   Depression    Dermatitis    Emphysema of lung ([facility])    GERD (gastroesophageal reflux disease)    Heart murmur    History of blood transfusion 2013   "w/cancer tx"   History of bronchitis    History of radiation  therapy 09/06/11-11/24/11   left breast  60.4gray total dose   Hyperlipidemia    Hypertension    Hypothyroidism    Insomnia    Myocardial infarction [facility]) 2003   Neuromuscular disorder ([place])    tingling left hand   Nonischemic cardiomyopathy ([facility])    LVEF 38%. MUGA, 2010, 50-55% by ECHO 04/14/11. Cardiologist Dr. [name]   Obesity    Other primary cardiomyopathies    Shingles    Shortness of breath 07/16/2011   "at all times"   Urticaria    Vertigo     PAST SURGICAL HISTORY: Past Surgical History:  Procedure Laterality Date   ABDOMINAL HYSTERECTOMY  ~2006   partial   BREAST BIOPSY Left    BREAST LUMPECTOMY Left 07/16/11   w/LND; left.ER/PR=positive   CARDIAC CATHETERIZATION  2001   negative for CAD   CATARACT EXTRACTION, BILATERAL Bilateral 10/17/2018   10/28/2018- Right 10/17/2018-left eye   CESAREAN SECTION  1981; 1985; [place] N/A 07/31/2012   Procedure: REMOVAL PORT-A-CATH;  Surgeon: Harl Bowie, MD;  [facility];  Service: General;  Laterality: N/A;   PORTACATH PLACEMENT Right 02/26/2011   Procedure:  INSERTION PORT-A-CATH;  Surgeon: Harl Bowie, MD;  Location: [facility];  Service: General;  Laterality: Right;   TUBAL LIGATION  1991    FAMILY HISTORY: Family History  Problem Relation Age of Onset   Leukemia Mother    Kidney failure Father    Heart disease Maternal Grandmother    Allergic rhinitis Grandchild    Asthma Grandchild    Eczema Grandchild    Hypertension Brother    Hyperlipidemia Brother    Hypertension Brother     SOCIAL HISTORY: Social History   Socioeconomic History   Marital status: Divorced    Spouse name: Not on file   Number of children: 3   Years of education: Not on file   Highest education level: Not on file  Occupational History   Not on file  Tobacco Use   Smoking status: Every Day    Packs/day: 0.25    Years: 30.00    Total pack years: 7.50    Types: Cigarettes   Smokeless tobacco: Never  Vaping Use   Vaping Use: Never used  Substance and Sexual Activity   Alcohol use: Not Currently    Comment: 06/28/2016 "don't drink anything anymroe"   Drug use: No   Sexual activity: Not Currently  Other Topics Concern   Not on file  Social History Narrative   Not on file   Social Determinants of Health   Financial Resource Strain: Not on file  Food Insecurity: Not on file  Transportation Needs: Not on file  Physical Activity: Not on file  Stress: Not on file  Social Connections: Not on file  Intimate Partner Violence: Not on file     PHYSICAL EXAM  GENERAL EXAM/CONSTITUTIONAL: Vitals:  Vitals:   03/10/22 1250  BP: 112/67  Pulse: 67  Weight: 183 lb (83 kg)  Height: '4\' 9"'$  (1.448 m)   Body mass index is 39.6 kg/m. Wt Readings from Last 3 Encounters:  03/10/22 183 lb (83 kg)  08/04/21 195 lb 3.2 oz (88.5 kg)  06/24/20 191 lb (86.6 kg)    NEUROLOGIC: MENTAL STATUS:  awake, alert, oriented to person, place and time recent and remote memory intact normal attention and concentration language fluent, comprehension intact fund of  knowledge appropriate  CRANIAL NERVE:  2nd, 3rd, 4th, 6th - pupils equal and  reactive to light, visual fields full to confrontation, extraocular muscles intact, no nystagmus 5th - facial sensation symmetric 7th - facial strength symmetric 8th - hearing intact 9th - palate elevates symmetrically, uvula midline 11th - shoulder shrug symmetric 12th - tongue protrusion midline  MOTOR:  4/5 left hip flexion, otherwise full strength in the BUE, BLE  SENSORY:  Decreased sensation to light touch and pinprick RLE, vibration intact throughout  COORDINATION:  finger-nose-finger intact bilaterally  REFLEXES:  deep tendon reflexes present and symmetric  GAIT/STATION:  Normal based gait. Feels subjectively unsteady with Romberg, but no sway     DIAGNOSTIC DATA (LABS, IMAGING, TESTING) - I reviewed patient records, labs, notes, testing and imaging myself where available.  Lab Results  Component Value Date   WBC 5.8 06/30/2016   HGB 11.9 (L) 06/30/2016   HCT 36.8 06/30/2016   MCV 87.0 06/30/2016   PLT 145 (L) 06/30/2016      Component Value Date/Time   NA 137 06/30/2016 0648   NA 144 05/04/2016 1420   K 4.2 06/30/2016 0648   K 2.9 (LL) 05/04/2016 1420   CL 105 06/30/2016 0648   CL 104 09/05/2012 1049   CO2 24 06/30/2016 0648   CO2 29 05/04/2016 1420   GLUCOSE 105 (H) 06/30/2016 0648   GLUCOSE 88 05/04/2016 1420   GLUCOSE 88 09/05/2012 1049   BUN 13 06/30/2016 0648   BUN 11.3 05/04/2016 1420   CREATININE 1.02 (H) 06/30/2016 0648   CREATININE 1.3 (H) 05/04/2016 1420   CALCIUM 9.2 06/30/2016 0648   CALCIUM 10.1 05/04/2016 1420   PROT 6.1 (L) 06/28/2016 1350   PROT 8.2 05/04/2016 1420   ALBUMIN 3.3 (L) 06/28/2016 1350   ALBUMIN 4.2 05/04/2016 1420   AST 14 (L) 06/28/2016 1350   AST 20 05/04/2016 1420   ALT 18 06/28/2016 1350   ALT 20 05/04/2016 1420   ALKPHOS 58 06/28/2016 1350   ALKPHOS 79 05/04/2016 1420   BILITOT 0.6 06/28/2016 1350   BILITOT 0.46 05/04/2016 1420    GFRNONAA >60 06/30/2016 0648   GFRAA >60 06/30/2016 0648   No results found for: "CHOL", "HDL", "LDLCALC", "LDLDIRECT", "TRIG", "CHOLHDL" No results found for: "HGBA1C" No results found for: "VITAMINB12" Lab Results  Component Value Date   TSH 0.40 06/12/2013    ASSESSMENT AND PLAN  61 y.o. year old female with a history of  CHF, MI, HTN, HLD, asthma, breast cancer, osteoarthritis who presents for evaluation of imbalance and unsteadiness over the past 2-3 months. Will order MRI L-spine as she reports radicular pain down her RLE and numbness in her right foot. Will also order brain MRI to assess for structural causes of new imbalance including neoplasm, as she does have a history of breast cancer.   1. Imbalance   2. Ataxia   3. Lumbar radiculopathy       PLAN: -MRI brain -MRI L-spine  Orders Placed This Encounter  Procedures   MR BRAIN W WO CONTRAST   MR LUMBAR SPINE WO CONTRAST    Meds ordered this encounter  Medications   LORazepam (ATIVAN) 0.5 MG tablet    Sig: Take 1-2 pills 30 minutes prior to MRIs    Dispense:  4 tablet    Refill:  0    Return for after testing.    Genia Harold, MD 03/10/22 1:22 PM  I spent an average of 26 minutes chart reviewing and counseling the patient, with at least 50% of the time face to face with the  patient.   [facility] [address], [place], [place] [address] [phone]

## 2022-03-10 ENCOUNTER — Ambulatory Visit: Payer: Medicare Other | Admitting: Psychiatry

## 2022-03-10 ENCOUNTER — Telehealth: Payer: Self-pay | Admitting: Psychiatry

## 2022-03-10 ENCOUNTER — Encounter: Payer: Self-pay | Admitting: Psychiatry

## 2022-03-10 VITALS — BP 112/67 | HR 67 | Ht <= 58 in | Wt 183.0 lb

## 2022-03-10 DIAGNOSIS — R2689 Other abnormalities of gait and mobility: Secondary | ICD-10-CM

## 2022-03-10 DIAGNOSIS — M5416 Radiculopathy, lumbar region: Secondary | ICD-10-CM

## 2022-03-10 DIAGNOSIS — R270 Ataxia, unspecified: Secondary | ICD-10-CM | POA: Diagnosis not present

## 2022-03-10 MED ORDER — LORAZEPAM 0.5 MG PO TABS: ORAL_TABLET | ORAL | 0 refills | Status: AC

## 2022-03-10 NOTE — Telephone Encounter (Signed)
[employer] medicare NPR sent to GI [phone] 

## 2022-03-23 ENCOUNTER — Other Ambulatory Visit: Payer: Self-pay

## 2022-03-23 DIAGNOSIS — E782 Mixed hyperlipidemia: Secondary | ICD-10-CM

## 2022-03-23 MED ORDER — ROSUVASTATIN CALCIUM 10 MG PO TABS: 10.0000 mg | ORAL_TABLET | Freq: Every day | ORAL | 0 refills | Status: DC

## 2022-03-25 ENCOUNTER — Other Ambulatory Visit: Payer: Self-pay | Admitting: Cardiology

## 2022-04-03 ENCOUNTER — Ambulatory Visit
Admission: RE | Admit: 2022-04-03 | Discharge: 2022-04-03 | Disposition: A | Discharge status: Home / Self Care | Payer: Medicare Other | Source: Ambulatory Visit | Attending: Psychiatry | Admitting: Psychiatry

## 2022-04-03 DIAGNOSIS — R270 Ataxia, unspecified: Secondary | ICD-10-CM

## 2022-04-03 DIAGNOSIS — M5416 Radiculopathy, lumbar region: Secondary | ICD-10-CM

## 2022-04-03 DIAGNOSIS — M5415 Radiculopathy, thoracolumbar region: Secondary | ICD-10-CM

## 2022-04-03 MED ORDER — GADOPICLENOL 0.5 MMOL/ML IV SOLN
7.5000 mL | Freq: Once | INTRAVENOUS | Status: AC | PRN
  Administered 2022-04-03: 7.5 mL via INTRAVENOUS

## 2022-04-03 MED ORDER — GADOPICLENOL 0.5 MMOL/ML IV SOLN: 7.5000 mL | Freq: Once | INTRAVENOUS | Status: DC | PRN

## 2022-04-05 ENCOUNTER — Telehealth: Payer: Self-pay

## 2022-04-05 ENCOUNTER — Other Ambulatory Visit: Payer: Self-pay | Admitting: Psychiatry

## 2022-04-05 DIAGNOSIS — M48061 Spinal stenosis, lumbar region without neurogenic claudication: Secondary | ICD-10-CM

## 2022-04-05 NOTE — Telephone Encounter (Signed)
Attempted to call patient and unable to leave message

## 2022-04-06 NOTE — Telephone Encounter (Signed)
Called the patient to advise the MRI of the brain and lumbar spine results and there was no answer. LVM advising the patient to call back.  Per Dr Billey Gosling, "Brain MRI is normal and does not show any cause for her symptoms"   MRI Lumbar Spine "MRI of the lumbar spine shows arthritis with possible pinched nerves at L3-4, L4-5, and L5-S1. This is likely contributing to her balance issues. I have placed a referral to orthopedics to discuss possible treatment options. She would also likely benefit from physical therapy for her lower back. If she is interested in this I will place a referral for her."

## 2022-04-07 ENCOUNTER — Telehealth: Payer: Self-pay | Admitting: Psychiatry

## 2022-04-07 NOTE — Telephone Encounter (Signed)
Noted, thank you

## 2022-04-07 NOTE — Telephone Encounter (Signed)
Pt returned call, informed her Brain MRI is normal and does not show any cause for her symptoms . Also MRI Lumbar Spine MRI of the lumbar spine shows arthritis with possible pinched nerves at L3-4, L4-5, and L5-S1. This is likely contributing to her balance issues. I have placed a referral to orthopedics to discuss possible treatment options. She would also likely benefit from physical therapy for her lower back. She declined PT, wishes to see ortho first. Advised to call office back with questions or concerns.  She also req referral to Ortho be sent to somewhere closer to her, in [place], I will discuss with referrals.

## 2022-04-07 NOTE — Telephone Encounter (Addendum)
Pt req referral be sent somewhere closer to home, . Can you please fax it to Ortho closer to her?

## 2022-04-07 NOTE — Telephone Encounter (Signed)
Referral for Orthopedics fax to [facility]. Phone: [phone], Fax: [phone]

## 2022-04-19 ENCOUNTER — Other Ambulatory Visit: Payer: Self-pay | Admitting: Cardiology

## 2022-04-19 DIAGNOSIS — I5042 Chronic combined systolic (congestive) and diastolic (congestive) heart failure: Secondary | ICD-10-CM

## 2022-05-13 ENCOUNTER — Other Ambulatory Visit: Payer: Self-pay | Admitting: Cardiology

## 2022-05-13 ENCOUNTER — Other Ambulatory Visit: Payer: Self-pay

## 2022-05-13 DIAGNOSIS — I5042 Chronic combined systolic (congestive) and diastolic (congestive) heart failure: Secondary | ICD-10-CM

## 2022-05-13 MED ORDER — AMLODIPINE BESYLATE 5 MG PO TABS: 5.0000 mg | ORAL_TABLET | Freq: Every day | ORAL | 0 refills | Status: DC

## 2022-05-18 ENCOUNTER — Ambulatory Visit: Payer: Medicare Other | Admitting: Cardiology

## 2022-05-18 ENCOUNTER — Encounter: Payer: Self-pay | Admitting: Cardiology

## 2022-05-18 VITALS — BP 123/74 | HR 69 | Resp 16 | Ht <= 58 in | Wt 177.0 lb

## 2022-05-18 DIAGNOSIS — F17200 Nicotine dependence, unspecified, uncomplicated: Secondary | ICD-10-CM

## 2022-05-18 DIAGNOSIS — I428 Other cardiomyopathies: Secondary | ICD-10-CM

## 2022-05-18 DIAGNOSIS — I10 Essential (primary) hypertension: Secondary | ICD-10-CM

## 2022-05-18 NOTE — Progress Notes (Signed)
Primary Physician/Referring:  Cher Nakai, MD  Patient ID: [name], female    DOB: Jan 01, 1961, 62 y.o.   MRN: [id number]  Chief Complaint  Patient presents with  . Cardiomyopathy  . Hypertension  . Follow-up   HPI:    [name]  is a 62 y.o. African-American female patient was diagnosed with nonischemic cardiomyopathy diagnosed in May 2012, had normal coronary arteries and EF 30 to 35% however her last echocardiogram in 2016 had revealed LVEF of 55%, hypertension, hyperlipidemia, morbid obesity, breast invasive ductal carcinoma SP lumpectomy on 07/16/2011 followed by chemotherapy and radiation therapy to the chest, tobacco use disorder, stage IIIa chronic kidney disease, presents for annual visit, states that she has been having difficulty in losing weight and has continued to smoke as she gained even more weight when she quit smoking.  She continues to have marked dyspnea on exertion, denies PND or orthopnea.  She has not had any chest pain or chest tightness.  She has been compliant with all her medications.  She is frustrated that she is unable to lose weight.  Past Medical History:  Diagnosis Date  . Anemia 05/19/2011   "w/cancer tx"  . Animal dander allergy    "cats and dogs"  . Anxiety   . Arthritis    "lower back" (06/28/2016)  . Breast cancer ([place]) 07/16/2011   left breast lumpectomy=invasive ductal ca,2. cm,high grade ductal ca in situ,11 benign lymph nodes  . Chest pain, unspecified    Sharp midsternal chest pain at night with no radiation. Hard for her to take a deep breath in. NTG does help. Normal Myocardial perfusion study 05/14/11.  [name] Chronic combined systolic and diastolic CHF (congestive heart failure) ([place])   . Chronic lower back pain   . Chronic lower back pain   . Congestive heart failure ([place])   . Coronary artery disease    Negative cath 2001  . Daily headache    "just recently; I'm not used to having headaches" (06/28/2016)  . Depression   . Dermatitis    . Emphysema of lung ([place])   . GERD (gastroesophageal reflux disease)   . Heart murmur   . History of blood transfusion 2013   "w/cancer tx"  . History of bronchitis   . History of radiation therapy 09/06/11-11/24/11   left breast  60.4gray total dose  . Hyperlipidemia   . Hypertension   . Hypothyroidism   . Insomnia   . Myocardial infarction ([place]) 2003  . Neuromuscular disorder (HCC)    tingling left hand  . Nonischemic cardiomyopathy (HCC)    LVEF 38%. MUGA, 2010, 50-55% by ECHO 04/14/11. Cardiologist Dr. [name]  . Obesity   . Other primary cardiomyopathies   . Shingles   . Shortness of breath 07/16/2011   "at all times"  . Urticaria   . Vertigo    Past Surgical History:  Procedure Laterality Date  . ABDOMINAL HYSTERECTOMY  ~2006   partial  . BREAST BIOPSY Left   . BREAST LUMPECTOMY Left 07/16/11   w/LND; left.ER/PR=positive  . CARDIAC CATHETERIZATION  2001   negative for CAD  . CATARACT EXTRACTION, BILATERAL Bilateral 10/17/2018   10/28/2018- Right 10/17/2018-left eye  . [place]; 1985; 1991  . PORT-A-CATH REMOVAL N/A 07/31/2012   Procedure: REMOVAL PORT-A-CATH;  Surgeon: Harl Bowie, MD;  Location: [facility];  Service: General;  Laterality: N/A;  . PORTACATH PLACEMENT Right 02/26/2011   Procedure: INSERTION PORT-A-CATH;  Surgeon: Harl Bowie,  MD;  Location: [facility];  Service: General;  Laterality: Right;  . TUBAL LIGATION  1991   Family History  Problem Relation Age of Onset  . Leukemia Mother   . Kidney failure Father   . Heart disease Maternal Grandmother   . Allergic rhinitis Grandchild   . Asthma Grandchild   . Eczema Grandchild   . Hypertension Brother   . Hyperlipidemia Brother   . Hypertension Brother     Social History   Tobacco Use  . Smoking status: Every Day    Packs/day: 0.25    Years: 30.00    Total pack years: 7.50    Types: Cigarettes  . Smokeless tobacco: Never  Substance Use Topics  . Alcohol use: Not  Currently    Comment: 06/28/2016 "don't drink anything anymroe"   Marital Status: Divorced  ROS  Review of Systems  Cardiovascular:  Positive for dyspnea on exertion and leg swelling. Negative for chest pain.   Objective  Blood pressure 123/74, pulse 69, resp. rate 16, height '4\' 9"'$  (1.448 m), weight 177 lb (80.3 kg), SpO2 97 %. Body mass index is 38.3 kg/m.     05/18/2022    3:56 PM 03/10/2022   12:50 PM 08/04/2021    3:31 PM  Vitals with BMI  Height '4\' 9"'$  '4\' 9"'$  '4\' 9"'$   Weight 177 lbs 183 lbs 195 lbs 3 oz  BMI 38.29 [id number]  Systolic [id number]  Diastolic 74 67 65  Pulse 69 67 64    Physical Exam Constitutional:      Appearance: She is obese.  Neck:     Vascular: No carotid bruit or JVD.  Cardiovascular:     Rate and Rhythm: Normal rate and regular rhythm.     Pulses: Normal pulses and intact distal pulses.     Heart sounds: Normal heart sounds. No murmur heard.    No gallop.  Pulmonary:     Effort: Pulmonary effort is normal.     Breath sounds: Normal breath sounds.  Abdominal:     General: Bowel sounds are normal.     Palpations: Abdomen is soft.  Musculoskeletal:        General: Swelling (left arm lymphedema) present.     Right lower leg: No edema.     Left lower leg: No edema.    Medications and allergies   Allergies  Allergen Reactions  . Aspirin Hives  . Docetaxel Other (See Comments)    Chest tightness and pain  . Morphine And Related Hives and Swelling  . Penicillins Hives    Has patient had a PCN reaction causing immediate rash, facial/tongue/throat swelling, SOB or lightheadedness with hypotension: Yes Has patient had a PCN reaction causing severe rash involving mucus membranes or skin necrosis: No Has patient had a PCN reaction that required hospitalization No Has patient had a PCN reaction occurring within the last 10 years: Yes If all of the above answers are "NO", then may proceed with Cephalosporin use.  . Clindamycin/Lincomycin Hives  .  Dairy Aid [Tilactase] Hives  . Lipitor [Atorvastatin] Hives  . Lisinopril Hives  . Morphine Other (See Comments)  . Other Hives    Reaction to cats and dogs  . Peanuts [Peanut Oil] Hives     Medication list after today's encounter   Current Outpatient Medications:  .  amLODipine (NORVASC) 5 MG tablet, Take 1 tablet (5 mg total) by mouth daily., Disp: 30 tablet, Rfl: 0 .  furosemide (LASIX) 20 MG tablet,  Take one tablet by mouth in the morning (Patient taking differently: 40 mg.), Disp: 90 tablet, Rfl: 0 .  hydrALAZINE (APRESOLINE) 50 MG tablet, TAKE 1 TABLET BY MOUTH THREE TIMES A DAY, Disp: 270 tablet, Rfl: 1 .  hydrOXYzine (ATARAX/VISTARIL) 25 MG tablet, Take 25 mg by mouth 4 (four) times daily., Disp: , Rfl:  .  isosorbide mononitrate (IMDUR) 60 MG 24 hr tablet, TAKE ONE TABLET BY MOUTH EVERY DAY, Disp: 90 tablet, Rfl: 1 .  levothyroxine (SYNTHROID, LEVOTHROID) 88 MCG tablet, Take 88 mcg by mouth daily before breakfast., Disp: , Rfl:  .  LORazepam (ATIVAN) 0.5 MG tablet, Take 1-2 pills 30 minutes prior to MRIs, Disp: 4 tablet, Rfl: 0 .  metoprolol (TOPROL-XL) 200 MG 24 hr tablet, TAKE 1 TABLET BY MOUTH ONCE DAILY, Disp: 90 tablet, Rfl: 0 .  montelukast (SINGULAIR) 10 MG tablet, Take 1 tablet (10 mg total) by mouth daily., Disp: 30 tablet, Rfl: 11 .  nitroGLYCERIN (NITROSTAT) 0.4 MG SL tablet, Place 1 tablet (0.4 mg total) under the tongue every 5 (five) minutes as needed for chest pain., Disp: 25 tablet, Rfl: 4 .  omeprazole (PRILOSEC) 20 MG capsule, Take 40 mg by mouth 2 (two) times daily., Disp: , Rfl:  .  oxyCODONE-acetaminophen (PERCOCET) 7.5-325 MG tablet, Take 1 tablet by mouth., Disp: , Rfl:  .  rosuvastatin (CRESTOR) 10 MG tablet, Take 1 tablet (10 mg total) by mouth at bedtime., Disp: 90 tablet, Rfl: 0 .  sertraline (ZOLOFT) 100 MG tablet, Take 100 mg by mouth daily., Disp: , Rfl:  .  spironolactone (ALDACTONE) 50 MG tablet, TAKE ONE TABLET BY MOUTH EVERY MORNING, Disp: 90  tablet, Rfl: 0 .  traMADol (ULTRAM) 50 MG tablet, Take 50 mg by mouth every 6 (six) hours as needed., Disp: , Rfl:  .  traZODone (DESYREL) 150 MG tablet, Take 150 mg by mouth at bedtime., Disp: , Rfl:   Laboratory examination:   External labs:   Cholesterol, total 169.000 m 07/16/2021 HDL 67.000 mg 07/16/2021 LDL 91.000 mg 07/16/2021 Triglycerides 52.000 mg 07/16/2021  Hemoglobin 12.800 g/d 06/30/2021 Platelets 232.000 x1 06/30/2021  Creatinine, Serum 1.480 mg/ 05/26/2021 Potassium 4.700 mm 05/26/2021 ALT (SGPT) 15.000 IU/ 05/14/2021  TSH 1.210 06/30/2021  Radiology:    Cardiac Studies:   Coronary Angiogram 08/07/2010:  Normal coronary arteries, moderately depressed ejection fraction with a EF of 30-35% with global hypokinesis. Non isvchemic dilated cardiomyopathy.  PCV ECHOCARDIOGRAM COMPLETE 05/24/2019  Narrative Echocardiogram 05/24/2019: Mildly depressed LV systolic function with visual EF 45-50%. Mild global hypokinesis No obvious regional wall motion abnormalities. Left ventricle cavity is normal in size. Doppler evidence of grade I (impaired) diastolic dysfunction, normal LAP. Calculated EF 43%. Left atrial cavity is mildly dilated. Mild (Grade I) mitral regurgitation. IVC is normal with a respiratory response of <50%. Prior study dated 06/2014 reported: LVEF 50-55%. Mild MR and Mild TR.     PCV MYOCARDIAL PERFUSION WITH LEXISCAN 09/01/2020  Narrative Lexiscan/Modified Bruce Tetrofosmin stress test 09/01/2020: Lexiscan/modified Bruce nuclear stress test performed using 1-day protocol. Patient achoeved 6.1 METS and reached 61% of MPHR. Stress EKG is non-diagnostic, as this is pharmacological stress test. In addition, stress EKG at 61% [facility] showed sinus rhythm, no ST-T changes. SPECT images show decreased tracer uptake in inferior apical myocardium, more pronounced at rest. This is likely due to breast attenuation, with imaging performed in sitting position. Ischemic in this  region cannot be excluded. Mild decreased in global myocardial thickening, Stress LVEF 40%. High risk due  to reduced stress LVEF. Recommend clinical correlation.  EKG:   EKG 06/24/2020: Normal sinus rhythm, 69 bpm, without underlying ischemia or injury pattern.    Assessment     ICD-10-CM   1. Non-ischemic cardiomyopathy (HCC)  I42.8 EKG 12-Lead    2. Primary hypertension  I10     3. Tobacco use disorder  F17.200        Medications Discontinued During This Encounter  Medication Reason  . simvastatin (ZOCOR) 40 MG tablet Patient Preference  . zolpidem (AMBIEN) 10 MG tablet Patient Preference    No orders of the defined types were placed in this encounter.  Orders Placed This Encounter  Procedures  . EKG 12-Lead   Recommendations:   EMELINA BENNICK is a 62 y.o.  African-American female patient was diagnosed with nonischemic cardiomyopathy diagnosed in May 2012, had normal coronary arteries and EF 30 to 35%, hypertension, hyperlipidemia, morbid obesity, breast invasive ductal carcinoma SP lumpectomy on 07/16/2011 followed by chemotherapy and radiation therapy to the chest, tobacco use disorder, stage IIIa chronic kidney disease, presents for annual visit.     She continues to have marked dyspnea on exertion. She is frustrated that she is unable to lose weight.  The results of the recently performed echocardiogram in 2021 which revealed LVEF of 45%, no evidence of ischemia by nuclear stress test in June 2022.  She is on multiple antihypertensive medications, could simplify the regimen.  I do not have her recent labs and we need a repeat echocardiogram as it has been discrepancy between echocardiogram and 2016 and 2021 revealing normal LVEF and mildly reduced LVEF respectively.  I do not think she is in acute decompensated heart failure and her dyspnea on exertion is related to continued tobacco use, asthmatic bronchitis, obesity hypoventilation.  I will obtain her prior labs prior to making  her suggesting any changes to her medications.  I discussed with her regarding smoking cessation.  Obesity also was discussed, calorie reduction discussed, evaluation.  See her back in 1 month to follow-up on echocardiogram and she will also bring me her recently performed labs and EKG from her PCP.    Adrian Prows, MD, [facility] 05/18/2022, 4:35 PM Office: [phone]

## 2022-06-12 ENCOUNTER — Other Ambulatory Visit: Payer: Self-pay | Admitting: Cardiology

## 2022-06-12 DIAGNOSIS — I5042 Chronic combined systolic (congestive) and diastolic (congestive) heart failure: Secondary | ICD-10-CM

## 2022-06-18 ENCOUNTER — Other Ambulatory Visit: Payer: Self-pay | Admitting: Cardiology

## 2022-06-18 DIAGNOSIS — E782 Mixed hyperlipidemia: Secondary | ICD-10-CM

## 2022-06-21 ENCOUNTER — Other Ambulatory Visit: Payer: Self-pay

## 2022-06-21 ENCOUNTER — Emergency Department (HOSPITAL_BASED_OUTPATIENT_CLINIC_OR_DEPARTMENT_OTHER)
Admission: EM | Admit: 2022-06-21 | Discharge: 2022-06-21 | Disposition: A | Discharge status: Home / Self Care | Payer: Medicare Other | Attending: Emergency Medicine | Admitting: Emergency Medicine

## 2022-06-21 ENCOUNTER — Encounter (HOSPITAL_BASED_OUTPATIENT_CLINIC_OR_DEPARTMENT_OTHER): Payer: Self-pay | Admitting: Emergency Medicine

## 2022-06-21 ENCOUNTER — Emergency Department (HOSPITAL_BASED_OUTPATIENT_CLINIC_OR_DEPARTMENT_OTHER): Payer: Medicare Other

## 2022-06-21 DIAGNOSIS — Z853 Personal history of malignant neoplasm of breast: Secondary | ICD-10-CM | POA: Diagnosis not present

## 2022-06-21 DIAGNOSIS — I890 Lymphedema, not elsewhere classified: Secondary | ICD-10-CM | POA: Diagnosis not present

## 2022-06-21 DIAGNOSIS — Z91010 Allergy to peanuts: Secondary | ICD-10-CM | POA: Insufficient documentation

## 2022-06-21 DIAGNOSIS — E039 Hypothyroidism, unspecified: Secondary | ICD-10-CM | POA: Insufficient documentation

## 2022-06-21 DIAGNOSIS — Z79899 Other long term (current) drug therapy: Secondary | ICD-10-CM | POA: Diagnosis not present

## 2022-06-21 DIAGNOSIS — I110 Hypertensive heart disease with heart failure: Secondary | ICD-10-CM | POA: Diagnosis not present

## 2022-06-21 DIAGNOSIS — I2510 Atherosclerotic heart disease of native coronary artery without angina pectoris: Secondary | ICD-10-CM | POA: Diagnosis not present

## 2022-06-21 DIAGNOSIS — I5042 Chronic combined systolic (congestive) and diastolic (congestive) heart failure: Secondary | ICD-10-CM | POA: Diagnosis not present

## 2022-06-21 DIAGNOSIS — Z79890 Hormone replacement therapy (postmenopausal): Secondary | ICD-10-CM | POA: Diagnosis not present

## 2022-06-21 DIAGNOSIS — R109 Unspecified abdominal pain: Secondary | ICD-10-CM | POA: Insufficient documentation

## 2022-06-21 LAB — URINALYSIS, ROUTINE W REFLEX MICROSCOPIC
Bilirubin Urine: NEGATIVE
Glucose, UA: NEGATIVE mg/dL
Hgb urine dipstick: NEGATIVE
Ketones, ur: NEGATIVE mg/dL
Leukocytes,Ua: NEGATIVE
Nitrite: NEGATIVE
Specific Gravity, Urine: 1.014 (ref 1.005–1.030)
pH: 8.5 — ABNORMAL HIGH (ref 5.0–8.0)

## 2022-06-21 LAB — COMPREHENSIVE METABOLIC PANEL
ALT: 63 U/L — ABNORMAL HIGH (ref 0–44)
AST: 51 U/L — ABNORMAL HIGH (ref 15–41)
Albumin: 4.1 g/dL (ref 3.5–5.0)
Alkaline Phosphatase: 65 U/L (ref 38–126)
Anion gap: 7 (ref 5–15)
BUN: 18 mg/dL (ref 6–20)
CO2: 31 mmol/L (ref 22–32)
Calcium: 10.6 mg/dL — ABNORMAL HIGH (ref 8.9–10.3)
Chloride: 102 mmol/L (ref 98–111)
Creatinine, Ser: 1.46 mg/dL — ABNORMAL HIGH (ref 0.44–1.00)
GFR, Estimated: 41 mL/min — ABNORMAL LOW (ref >60)
Glucose, Bld: 83 mg/dL (ref 70–99)
Potassium: 4.8 mmol/L (ref 3.5–5.1)
Sodium: 140 mmol/L (ref 135–145)
Total Bilirubin: 0.4 mg/dL (ref 0.3–1.2)
Total Protein: 7.5 g/dL (ref 6.5–8.1)

## 2022-06-21 LAB — CBC
HCT: 39.4 % (ref 36.0–46.0)
Hemoglobin: 12.6 g/dL (ref 12.0–15.0)
MCH: 28.4 pg (ref 26.0–34.0)
MCHC: 32 g/dL (ref 30.0–36.0)
MCV: 88.7 fL (ref 80.0–100.0)
Platelets: 188 10*3/uL (ref 150–400)
RBC: 4.44 MIL/uL (ref 3.87–5.11)
RDW: 13.7 % (ref 11.5–15.5)
WBC: 5.1 10*3/uL (ref 4.0–10.5)
nRBC: 0 % (ref 0.0–0.2)

## 2022-06-21 LAB — LIPASE, BLOOD: Lipase: 62 U/L — ABNORMAL HIGH (ref 11–51)

## 2022-06-21 MED ORDER — ONDANSETRON 4 MG PO TBDP: 4.0000 mg | ORAL_TABLET | Freq: Three times a day (TID) | ORAL | 0 refills | Status: DC | PRN

## 2022-06-21 MED ORDER — IOHEXOL 350 MG/ML SOLN
100.0000 mL | Freq: Once | INTRAVENOUS | Status: AC | PRN
  Administered 2022-06-21: 85 mL via INTRAVENOUS

## 2022-06-21 MED ORDER — FENTANYL CITRATE PF 50 MCG/ML IJ SOSY
50.0000 ug | PREFILLED_SYRINGE | Freq: Once | INTRAMUSCULAR | Status: AC
  Administered 2022-06-21: 50 ug via INTRAVENOUS
  Filled 2022-06-21: qty 1

## 2022-06-21 MED ORDER — PROCHLORPERAZINE EDISYLATE 10 MG/2ML IJ SOLN
5.0000 mg | Freq: Once | INTRAMUSCULAR | Status: AC
  Administered 2022-06-21: 5 mg via INTRAVENOUS
  Filled 2022-06-21: qty 2

## 2022-06-21 MED ORDER — OMEPRAZOLE 20 MG PO CPDR: 20.0000 mg | DELAYED_RELEASE_CAPSULE | Freq: Every day | ORAL | 0 refills | Status: DC

## 2022-06-21 NOTE — ED Notes (Signed)
Pt family in hall requesting pain medication for patient. Request communicated to MD. See [facility] for med admin.

## 2022-06-21 NOTE — ED Notes (Signed)
MD at bedside. 

## 2022-06-21 NOTE — ED Provider Notes (Signed)
[facility] Provider Note   CSN: [id number] Arrival date & time: 06/21/22  1613     History  Chief Complaint  Patient presents with   Abdominal Pain    [name] is a 62 y.o. female.   Abdominal Pain Patient presents with abdominal pain constipation.  Has had no bowel movement for around 5 days now.  Today is Monday and last bowel movement was on Wednesday.  States abdomen is more swollen diffusely tender.  No dysuria.  Is on chronic pain medicine for chronic low back pain.  No fevers.  No dysuria.  No recent change in her medications.  States she is taking some MiraLAX but has not had much for the last few days.  States she did take some magnesium citrate without relief.    Past Medical History:  Diagnosis Date   Anemia 05/19/2011   "w/cancer tx"   Animal dander allergy    "cats and dogs"   Anxiety    Arthritis    "lower back" (06/28/2016)   Breast cancer 07/16/2011   left breast lumpectomy=invasive ductal ca,2. cm,high grade ductal ca in situ,11 benign lymph nodes   Chest pain, unspecified    Sharp midsternal chest pain at night with no radiation. Hard for her to take a deep breath in. NTG does help. Normal Myocardial perfusion study 05/14/11.   Chronic combined systolic and diastolic CHF (congestive heart failure)    Chronic lower back pain    Chronic lower back pain    Congestive heart failure    Coronary artery disease    Negative cath 2001   Daily headache    "just recently; I'm not used to having headaches" (06/28/2016)   Depression    Dermatitis    Emphysema of lung    GERD (gastroesophageal reflux disease)    Heart murmur    History of blood transfusion 2013   "w/cancer tx"   History of bronchitis    History of radiation therapy 09/06/11-11/24/11   left breast  60.4gray total dose   Hyperlipidemia    Hypertension    Hypothyroidism    Insomnia    Myocardial infarction 2003   Neuromuscular disorder    tingling left  hand   Nonischemic cardiomyopathy    LVEF 38%. MUGA, 2010, 50-55% by ECHO 04/14/11. Cardiologist Dr. [name]   Obesity    Other primary cardiomyopathies    Shingles    Shortness of breath 07/16/2011   "at all times"   Urticaria    Vertigo     Home Medications Prior to Admission medications   Medication Sig Start Date End Date Taking? Authorizing Provider  omeprazole (PRILOSEC) 20 MG capsule Take 1 capsule (20 mg total) by mouth daily. 06/21/22  Yes Davonna Belling, MD  ondansetron (ZOFRAN-ODT) 4 MG disintegrating tablet Take 1 tablet (4 mg total) by mouth every 8 (eight) hours as needed for nausea or vomiting. 06/21/22  Yes Davonna Belling, MD  amLODipine (NORVASC) 5 MG tablet 1 tablet by mouth daily 06/14/22   Adrian Prows, MD  furosemide (LASIX) 20 MG tablet Take one tablet by mouth in the morning Patient taking differently: 40 mg. 12/08/20   Tolia, Sunit, DO  hydrALAZINE (APRESOLINE) 50 MG tablet TAKE 1 TABLET BY MOUTH THREE TIMES A DAY 01/15/22   Adrian Prows, MD  hydrOXYzine (ATARAX/VISTARIL) 25 MG tablet Take 25 mg by mouth 4 (four) times daily. 12/31/19   [provider]  isosorbide mononitrate (IMDUR) 60 MG  24 hr tablet TAKE ONE TABLET BY MOUTH EVERY DAY 02/01/22   Adrian Prows, MD  levothyroxine (SYNTHROID, LEVOTHROID) 88 MCG tablet Take 88 mcg by mouth daily before breakfast.    [provider]  LORazepam (ATIVAN) 0.5 MG tablet Take 1-2 pills 30 minutes prior to MRIs 03/10/22   Genia Harold, MD  metoprolol (TOPROL-XL) 200 MG 24 hr tablet TAKE 1 TABLET BY MOUTH ONCE DAILY 03/26/22   Tolia, Sunit, DO  montelukast (SINGULAIR) 10 MG tablet Take 1 tablet (10 mg total) by mouth daily. 03/28/17   Kennith Gain, MD  nitroGLYCERIN (NITROSTAT) 0.4 MG SL tablet Place 1 tablet (0.4 mg total) under the tongue every 5 (five) minutes as needed for chest pain. 07/21/21   Tolia, Sunit, DO  oxyCODONE-acetaminophen (PERCOCET) 7.5-325 MG tablet Take 1 tablet by mouth. 06/23/16    [provider]  rosuvastatin (CRESTOR) 10 MG tablet TAKE ONE TABLET BY MOUTH AT BEDTIME 06/21/22   Tolia, Sunit, DO  sertraline (ZOLOFT) 100 MG tablet Take 100 mg by mouth daily.    [provider]  spironolactone (ALDACTONE) 50 MG tablet TAKE ONE TABLET BY MOUTH EVERY MORNING 06/21/22   Adrian Prows, MD  traMADol (ULTRAM) 50 MG tablet Take 50 mg by mouth every 6 (six) hours as needed. 05/17/22   [provider]  traZODone (DESYREL) 150 MG tablet Take 150 mg by mouth at bedtime. 02/20/22   [provider]      Allergies    Aspirin, Docetaxel, Morphine and related, Penicillins, Clindamycin/lincomycin, Dairy aid [tilactase], Lipitor [atorvastatin], Lisinopril, Morphine, Other, and Peanuts [peanut oil]    Review of Systems   Review of Systems  Gastrointestinal:  Positive for abdominal pain.    Physical Exam Updated Vital Signs BP 129/79   Pulse (!) 59   Temp 98 F (36.7 C)   Resp 20   Wt 77.6 kg   SpO2 96%   BMI 37.00 kg/m  Physical Exam Vitals and nursing note reviewed.  Cardiovascular:     Rate and Rhythm: Normal rate and regular rhythm.  Pulmonary:     Breath sounds: Normal breath sounds.  Abdominal:     Tenderness: There is abdominal tenderness.     Hernia: No hernia is present.     Comments: Diffuse swelling and some tenderness.  No hernia palpated.  Skin:    Comments: Lymphedema left upper extremity.  Trace edema bilateral lower extremities.  Neurological:     Mental Status: She is alert.     ED Results / Procedures / Treatments   Labs (all labs ordered are listed, but only abnormal results are displayed) Labs Reviewed  LIPASE, BLOOD - Abnormal; Notable for the following components:      Result Value   Lipase 62 (*)    All other components within normal limits  COMPREHENSIVE METABOLIC PANEL - Abnormal; Notable for the following components:   Creatinine, Ser 1.46 (*)    Calcium 10.6 (*)    AST 51 (*)    ALT 63 (*)    GFR,  Estimated 41 (*)    All other components within normal limits  URINALYSIS, ROUTINE W REFLEX MICROSCOPIC - Abnormal; Notable for the following components:   pH 8.5 (*)    Protein, ur TRACE (*)    All other components within normal limits  CBC    EKG None  Radiology CT ABDOMEN PELVIS W CONTRAST  Result Date: 06/21/2022 CLINICAL DATA:  Abdomen pain with swelling EXAM: CT ABDOMEN AND PELVIS  WITH CONTRAST TECHNIQUE: Multidetector CT imaging of the abdomen and pelvis was performed using the standard protocol following bolus administration of intravenous contrast. RADIATION DOSE REDUCTION: This exam was performed according to the departmental dose-optimization program which includes automated exposure control, adjustment of the mA and/or kV according to patient size and/or use of iterative reconstruction technique. CONTRAST:  41mL OMNIPAQUE IOHEXOL 350 MG/ML SOLN COMPARISON:  MRI 11/20/2005, PET CT 02/24/2011, MRI lumbar spine 04/03/2022 FINDINGS: Lower chest: Lung bases demonstrate no acute airspace disease. Hepatobiliary: Punctate calcification at the gallbladder fundus. No biliary dilatation Pancreas: Unremarkable. No pancreatic ductal dilatation or surrounding inflammatory changes. Spleen: Normal in size without focal abnormality. Adrenals/Urinary Tract: Left adrenal gland is normal. 15 mm right adrenal gland nodule, stable as compared with prior PET CT from 2012 and felt consistent with benign finding such as adenoma. Kidneys show no hydronephrosis. The bladder is unremarkable Stomach/Bowel: The stomach is nonenlarged. No dilated small bowel. Negative appendix. No acute bowel wall thickening. Vascular/Lymphatic: Moderate aortic atherosclerosis. No aneurysm. Retroaortic left renal vein. No suspicious lymph nodes Reproductive: Status post hysterectomy. No adnexal masses. Other: Negative for pelvic effusion or free air Musculoskeletal: No acute or significant osseous findings. Chronic superior endplate  deformity at L3 IMPRESSION: 1. No CT evidence for acute intra-abdominal or pelvic abnormality. 2. Punctate calcification at the gallbladder fundus, possible small stone. 3. Aortic atherosclerosis. Aortic Atherosclerosis (ICD10-I70.0). Electronically Signed   By: Donavan Foil M.D.   On: 06/21/2022 19:36    Procedures Procedures    Medications Ordered in ED Medications  iohexol (OMNIPAQUE) 350 MG/ML injection 100 mL (85 mLs Intravenous Contrast Given 06/21/22 1903)  prochlorperazine (COMPAZINE) injection 5 mg (5 mg Intravenous Given 06/21/22 2018)  fentaNYL (SUBLIMAZE) injection 50 mcg (50 mcg Intravenous Given 06/21/22 2046)    ED Course/ Medical Decision Making/ A&P                             Medical Decision Making Amount and/or Complexity of Data Reviewed Labs: ordered. Radiology: ordered.  Risk Prescription drug management.   Patient abdominal pain.  Constipation.  Diffuse swelling.  Will get basic blood work.  Will do rectal exam to evaluate for impaction.  Does feel as if she needs to have a bowel movement.  He is on chronic pain meds.  I think likely will require CT scan.  Blood work overall reassuring.  CT scan done and independently interpreted and reviewed with the patient.  No clear cause of the pain found.  Not particularly constipated either on the scan.  Patient somewhat unhappy with the results that we did not find anything but appears stable for discharge.  Will treat symptomatically and follow-up as an outpatient.  Return for worsening symptoms.        Final Clinical Impression(s) / ED Diagnoses Final diagnoses:  Abdominal pain, unspecified abdominal location    Rx / DC Orders ED Discharge Orders          Ordered    omeprazole (PRILOSEC) 20 MG capsule  Daily        06/21/22 2135    ondansetron (ZOFRAN-ODT) 4 MG disintegrating tablet  Every 8 hours PRN        06/21/22 2135              Davonna Belling, MD 06/21/22 2341

## 2022-06-21 NOTE — ED Notes (Signed)
MD back to speak to patient/family

## 2022-06-21 NOTE — ED Triage Notes (Signed)
Pt arrives pov, to triage in wheelchair, with c/o ABD pain and swelling starting yesterday. Tenderness noted Pt endorses decreased appetite. Hx of CHF. Denies otc pain meds today

## 2022-06-25 ENCOUNTER — Other Ambulatory Visit: Payer: Self-pay | Admitting: Cardiology

## 2022-07-27 ENCOUNTER — Other Ambulatory Visit: Payer: Medicare Other

## 2022-08-03 ENCOUNTER — Ambulatory Visit: Payer: Medicare Other | Admitting: Cardiology

## 2022-08-03 ENCOUNTER — Other Ambulatory Visit: Payer: Medicare Other

## 2022-08-12 ENCOUNTER — Ambulatory Visit: Payer: Medicare Other | Admitting: Cardiology

## 2022-08-14 ENCOUNTER — Other Ambulatory Visit: Payer: Self-pay | Admitting: Cardiology

## 2022-08-31 ENCOUNTER — Ambulatory Visit: Payer: Medicare Other

## 2022-08-31 DIAGNOSIS — I5042 Chronic combined systolic (congestive) and diastolic (congestive) heart failure: Secondary | ICD-10-CM

## 2022-08-31 DIAGNOSIS — I428 Other cardiomyopathies: Secondary | ICD-10-CM | POA: Diagnosis not present

## 2022-09-02 NOTE — Progress Notes (Signed)
Echocardiogram 08/31/2022: Mildly depressed LV systolic function with visual EF 45-50%. Left ventricle cavity is mildly dilated. Normal left ventricular wall thickness. Hypokinetic global wall motion. Doppler evidence of grade I (impaired) diastolic dysfunction, normal LAP. Mild (Grade I) mitral regurgitation. Mild tricuspid regurgitation. No evidence of pulmonary hypertension. Compared to 05/23/2019 Mild LAE is now normal, mild TR is new, otherwise no significant change.   Discuss on OV soon

## 2022-09-03 DIAGNOSIS — J309 Allergic rhinitis, unspecified: Secondary | ICD-10-CM | POA: Diagnosis not present

## 2022-09-03 DIAGNOSIS — G4700 Insomnia, unspecified: Secondary | ICD-10-CM | POA: Diagnosis not present

## 2022-09-03 DIAGNOSIS — R519 Headache, unspecified: Secondary | ICD-10-CM | POA: Diagnosis not present

## 2022-09-03 DIAGNOSIS — Z853 Personal history of malignant neoplasm of breast: Secondary | ICD-10-CM | POA: Diagnosis not present

## 2022-09-03 DIAGNOSIS — I509 Heart failure, unspecified: Secondary | ICD-10-CM | POA: Diagnosis not present

## 2022-09-03 DIAGNOSIS — D464 Refractory anemia, unspecified: Secondary | ICD-10-CM | POA: Diagnosis not present

## 2022-09-03 DIAGNOSIS — R7989 Other specified abnormal findings of blood chemistry: Secondary | ICD-10-CM | POA: Diagnosis not present

## 2022-09-03 DIAGNOSIS — I10 Essential (primary) hypertension: Secondary | ICD-10-CM | POA: Diagnosis not present

## 2022-09-03 DIAGNOSIS — N1832 Chronic kidney disease, stage 3b: Secondary | ICD-10-CM | POA: Diagnosis not present

## 2022-09-03 DIAGNOSIS — J45909 Unspecified asthma, uncomplicated: Secondary | ICD-10-CM | POA: Diagnosis not present

## 2022-09-03 DIAGNOSIS — K219 Gastro-esophageal reflux disease without esophagitis: Secondary | ICD-10-CM | POA: Diagnosis not present

## 2022-09-03 DIAGNOSIS — M159 Polyosteoarthritis, unspecified: Secondary | ICD-10-CM | POA: Diagnosis not present

## 2022-09-07 ENCOUNTER — Encounter: Payer: Self-pay | Admitting: Cardiology

## 2022-09-07 ENCOUNTER — Ambulatory Visit: Payer: Medicare Other | Admitting: Cardiology

## 2022-09-07 VITALS — BP 119/80 | HR 62 | Ht <= 58 in | Wt 161.0 lb

## 2022-09-07 DIAGNOSIS — R0609 Other forms of dyspnea: Secondary | ICD-10-CM

## 2022-09-07 DIAGNOSIS — I10 Essential (primary) hypertension: Secondary | ICD-10-CM

## 2022-09-07 DIAGNOSIS — I428 Other cardiomyopathies: Secondary | ICD-10-CM | POA: Diagnosis not present

## 2022-09-07 MED ORDER — METOPROLOL SUCCINATE ER 100 MG PO TB24: 100.0000 mg | ORAL_TABLET | Freq: Every day | ORAL | 1 refills | Status: AC

## 2022-09-07 NOTE — Progress Notes (Signed)
Primary Physician/Referring:  Simone Curia, MD  Patient ID: [name], female    DOB: 08/31/1960, 62 y.o.   MRN: [id number]  Chief Complaint  Patient presents with   Non-ischemic cardiomyopathy   Follow-up   HPI:    [name]  is a 62 y.o. African-American female patient was diagnosed with nonischemic cardiomyopathy diagnosed in May 2012, had normal coronary arteries and EF 30 to 35% improved to 45-50% with medical management of cardiomyopathy, hypertension, hyperlipidemia, morbid obesity, breast invasive ductal carcinoma SP lumpectomy on 07/16/2011 followed by chemotherapy and radiation therapy to the chest, tobacco use disorder, stage IIIa chronic kidney disease, presents for annual visit,  Except for chronic mild dyspnea on exertion and occasional episodes of wheezing, denies PND or orthopnea.  She has not had any chest pain or chest tightness.  She has been compliant with all her medications.    Past Medical History:  Diagnosis Date   Anemia 05/19/2011   "w/cancer tx"   Animal dander allergy    "cats and dogs"   Anxiety    Arthritis    "lower back" (06/28/2016)   Breast cancer (HCC) 07/16/2011   left breast lumpectomy=invasive ductal ca,2. cm,high grade ductal ca in situ,11 benign lymph nodes   Chest pain, unspecified    Sharp midsternal chest pain at night with no radiation. Hard for her to take a deep breath in. NTG does help. Normal Myocardial perfusion study 05/14/11.   Chronic combined systolic and diastolic CHF (congestive heart failure) (HCC)    Chronic lower back pain    Chronic lower back pain    Congestive heart failure (HCC)    Coronary artery disease    Negative cath 2001   Daily headache    "just recently; I'm not used to having headaches" (06/28/2016)   Depression    Dermatitis    Emphysema of lung (HCC)    GERD (gastroesophageal reflux disease)    Heart murmur    History of blood transfusion 2013   "w/cancer tx"   History of bronchitis    History of  radiation therapy 09/06/11-11/24/11   left breast  60.4gray total dose   Hyperlipidemia    Hypertension    Hypothyroidism    Insomnia    Myocardial infarction [facility]) 2003   Neuromuscular disorder (HCC)    tingling left hand   Nonischemic cardiomyopathy (HCC)    LVEF 38%. MUGA, 2010, 50-55% by ECHO 04/14/11. Cardiologist Dr. Armanda Magic   Obesity    Other primary cardiomyopathies    Shingles    Shortness of breath 07/16/2011   "at all times"   Urticaria    Vertigo    Past Surgical History:  Procedure Laterality Date   ABDOMINAL HYSTERECTOMY  ~2006   partial   BREAST BIOPSY Left    BREAST LUMPECTOMY Left 07/16/11   w/LND; left.ER/PR=positive   CARDIAC CATHETERIZATION  2001   negative for CAD   CATARACT EXTRACTION, BILATERAL Bilateral 10/17/2018   10/28/2018- Right 10/17/2018-left eye   CESAREAN SECTION  1981; 1985; 1991   PORT-A-CATH REMOVAL N/A 07/31/2012   Procedure: REMOVAL PORT-A-CATH;  Surgeon: Shelly Rubenstein, MD;  Location: [facility] OR[facility];  Service: General;  Laterality: N/A;   PORTACATH PLACEMENT Right 02/26/2011   Procedure: INSERTION PORT-A-CATH;  Surgeon: Shelly Rubenstein, MD;  Location: [facility] OR;  Service: General;  Laterality: Right;   TUBAL LIGATION  1991   Family History  Problem Relation Age of Onset   Leukemia Mother    Kidney  failure Father    Heart disease Maternal Grandmother    Allergic rhinitis Grandchild    Asthma Grandchild    Eczema Grandchild    Hypertension Brother    Hyperlipidemia Brother    Hypertension Brother     Social History   Tobacco Use   Smoking status: Every Day    Packs/day: 0.25    Years: 30.00    Additional pack years: 0.00    Total pack years: 7.50    Types: Cigarettes   Smokeless tobacco: Never  Substance Use Topics   Alcohol use: Not Currently    Comment: 06/28/2016 "don't drink anything anymroe"   Marital Status: Divorced  ROS  Review of Systems  Cardiovascular:  Positive for dyspnea on exertion and leg swelling. Negative  for chest pain.   Objective  Blood pressure 119/80, pulse 62, height 4\' 9"  (1.448 m), weight 161 lb (73 kg), SpO2 95 %. Body mass index is 34.84 kg/m.     09/07/2022    2:42 PM 06/21/2022    9:00 PM 06/21/2022    8:30 PM  Vitals with BMI  Height 4\' 9"     Weight 161 lbs    BMI 34.83    Systolic 119 129 161  Diastolic 80 79 71  Pulse 62 59 59    Physical Exam Constitutional:      Appearance: She is obese.  Neck:     Vascular: No carotid bruit or JVD.  Cardiovascular:     Rate and Rhythm: Normal rate and regular rhythm.     Pulses: Normal pulses and intact distal pulses.     Heart sounds: Normal heart sounds. No murmur heard.    No gallop.  Pulmonary:     Effort: Pulmonary effort is normal.     Breath sounds: Normal breath sounds.  Abdominal:     General: Bowel sounds are normal.     Palpations: Abdomen is soft.  Musculoskeletal:        General: Swelling (left arm lymphedema) present.     Right lower leg: No edema.     Left lower leg: No edema.     Medications and allergies   Allergies  Allergen Reactions   Aspirin Hives   Docetaxel Other (See Comments)    Chest tightness and pain   Morphine And Codeine Hives and Swelling   Penicillins Hives    Has patient had a PCN reaction causing immediate rash, facial/tongue/throat swelling, SOB or lightheadedness with hypotension: Yes Has patient had a PCN reaction causing severe rash involving mucus membranes or skin necrosis: No Has patient had a PCN reaction that required hospitalization No Has patient had a PCN reaction occurring within the last 10 years: Yes If all of the above answers are "NO", then may proceed with Cephalosporin use.   Clindamycin/Lincomycin Hives   Dairy Aid [Tilactase] Hives   Lipitor [Atorvastatin] Hives   Lisinopril Hives   Morphine Other (See Comments)   Other Hives    Reaction to cats and dogs   Peanuts [Peanut Oil] Hives     Medication list after today's encounter   Current Outpatient  Medications:    albuterol (VENTOLIN HFA) 108 (90 Base) MCG/ACT inhaler, Inhale 1 puff into the lungs every 6 (six) hours as needed., Disp: , Rfl:    amLODipine (NORVASC) 5 MG tablet, 1 tablet by mouth daily, Disp: 90 tablet, Rfl: 3   hydrALAZINE (APRESOLINE) 50 MG tablet, TAKE 1 TABLET BY MOUTH THREE TIMES A DAY, Disp: 270 tablet, Rfl:  1   hydrOXYzine (ATARAX/VISTARIL) 25 MG tablet, Take 25 mg by mouth as needed for itching., Disp: , Rfl:    isosorbide mononitrate (IMDUR) 60 MG 24 hr tablet, TAKE ONE TABLET BY MOUTH EVERY DAY, Disp: 90 tablet, Rfl: 1   levothyroxine (SYNTHROID, LEVOTHROID) 88 MCG tablet, Take 88 mcg by mouth daily before breakfast., Disp: , Rfl:    LORazepam (ATIVAN) 0.5 MG tablet, Take 1-2 pills 30 minutes prior to MRIs, Disp: 4 tablet, Rfl: 0   montelukast (SINGULAIR) 10 MG tablet, Take 1 tablet (10 mg total) by mouth daily., Disp: 30 tablet, Rfl: 11   nitroGLYCERIN (NITROSTAT) 0.4 MG SL tablet, Place 1 tablet (0.4 mg total) under the tongue every 5 (five) minutes as needed for chest pain., Disp: 25 tablet, Rfl: 4   omeprazole (PRILOSEC) 20 MG capsule, Take 1 capsule (20 mg total) by mouth daily., Disp: 14 capsule, Rfl: 0   oxyCODONE-acetaminophen (PERCOCET) 7.5-325 MG tablet, Take 1 tablet by mouth., Disp: , Rfl:    rosuvastatin (CRESTOR) 10 MG tablet, TAKE ONE TABLET BY MOUTH AT BEDTIME, Disp: 90 tablet, Rfl: 0   sertraline (ZOLOFT) 100 MG tablet, Take 100 mg by mouth daily., Disp: , Rfl:    spironolactone (ALDACTONE) 50 MG tablet, TAKE ONE TABLET BY MOUTH EVERY MORNING, Disp: 90 tablet, Rfl: 0   traMADol (ULTRAM) 50 MG tablet, Take 50 mg by mouth every 6 (six) hours as needed., Disp: , Rfl:    traZODone (DESYREL) 150 MG tablet, Take 150 mg by mouth at bedtime., Disp: , Rfl:    furosemide (LASIX) 20 MG tablet, Take 1 tablet (20 mg total) by mouth every morning., Disp: 90 tablet, Rfl: 0   metoprolol (TOPROL-XL) 100 MG 24 hr tablet, Take 1 tablet (100 mg total) by mouth daily.,  Disp: 90 tablet, Rfl: 1  Laboratory examination:   Lab Results  Component Value Date   NA 140 06/21/2022   K 4.8 06/21/2022   CO2 31 06/21/2022   GLUCOSE 83 06/21/2022   BUN 18 06/21/2022   CREATININE 1.46 (H) 06/21/2022   CALCIUM 10.6 (H) 06/21/2022   GFR 52.58 (L) 06/12/2013   EGFR 55 (L) 05/04/2016   GFRNONAA 41 (L) 06/21/2022       Latest Ref Rng & Units 06/21/2022    4:58 PM 06/30/2016    6:48 AM 06/29/2016    4:15 AM  CBC  WBC 4.0 - 10.5 K/uL 5.1  5.8  9.6   Hemoglobin 12.0 - 15.0 g/dL 16.1  09.6  04.5   Hematocrit 36.0 - 46.0 % 39.4  36.8  34.5   Platelets 150 - 400 K/uL 188  145  141     External labs:   Cholesterol, total 169.000 m 07/16/2021 HDL 67.000 mg 07/16/2021 LDL 91.000 mg 07/16/2021 Triglycerides 52.000 mg 07/16/2021  Hemoglobin 12.800 g/d 06/30/2021 Platelets 232.000 x1 06/30/2021  Creatinine, Serum 1.480 mg/ 05/26/2021 Potassium 4.700 mm 05/26/2021 ALT (SGPT) 15.000 IU/ 05/14/2021  TSH 1.210 06/30/2021  Radiology:    Cardiac Studies:   Coronary Angiogram 08/07/2010:  Normal coronary arteries, moderately depressed ejection fraction with a EF of 30-35% with global hypokinesis. Non isvchemic dilated cardiomyopathy.  PCV MYOCARDIAL PERFUSION WITH LEXISCAN 09/01/2020  Narrative Lexiscan/Modified Bruce Tetrofosmin stress test 09/01/2020: Lexiscan/modified Bruce nuclear stress test performed using 1-day protocol. Patient achoeved 6.1 METS and reached 61% of MPHR. Stress EKG is non-diagnostic, as this is pharmacological stress test. In addition, stress EKG at 61% [facility] showed sinus rhythm, no ST-T changes. SPECT images  show decreased tracer uptake in inferior apical myocardium, more pronounced at rest. This is likely due to breast attenuation, with imaging performed in sitting position. Ischemic in this region cannot be excluded. Mild decreased in global myocardial thickening, Stress LVEF 40%. High risk due to reduced stress LVEF. Recommend clinical  correlation.  Echocardiogram 08/31/2022: Mildly depressed LV systolic function with visual EF 45-50%. Left ventricle cavity is mildly dilated. Normal left ventricular wall thickness. Hypokinetic global wall motion. Doppler evidence of grade I (impaired) diastolic dysfunction, normal LAP. Mild (Grade I) mitral regurgitation. Mild tricuspid regurgitation. No evidence of pulmonary hypertension. Compared to 05/23/2019 Mild LAE is now normal, mild TR is new, otherwise no significant change.  EKG:   EKG 09/07/2022: Normal sinus rhythm at the rate of 58 bpm, normal axis, IVCD, LVH.  No evidence of ischemia, normal QT interval.  Compared to 05/18/2022, no significant change.  Assessment     ICD-10-CM   1. Non-ischemic cardiomyopathy (HCC)  I42.8 EKG 12-Lead    metoprolol (TOPROL-XL) 100 MG 24 hr tablet    furosemide (LASIX) 20 MG tablet    2. Dyspnea on exertion  R06.09     3. Primary hypertension  I10 metoprolol (TOPROL-XL) 100 MG 24 hr tablet       Medications Discontinued During This Encounter  Medication Reason   ondansetron (ZOFRAN-ODT) 4 MG disintegrating tablet Completed Course   furosemide (LASIX) 20 MG tablet Reorder   metoprolol (TOPROL-XL) 200 MG 24 hr tablet Reorder    Meds ordered this encounter  Medications   metoprolol (TOPROL-XL) 100 MG 24 hr tablet    Sig: Take 1 tablet (100 mg total) by mouth daily.    Dispense:  90 tablet    Refill:  1   Orders Placed This Encounter  Procedures   EKG 12-Lead   Recommendations:   MICHALAH ABADILLA is a 62 y.o.  African-American female patient was diagnosed with nonischemic cardiomyopathy diagnosed in May 2012, had normal coronary arteries and EF 30 to 35%, hypertension, hyperlipidemia, morbid obesity, breast invasive ductal carcinoma SP lumpectomy on 07/16/2011 followed by chemotherapy and radiation therapy to the chest, tobacco use disorder, stage IIIa chronic kidney disease, presents for annual visit.  She completely quit smoking on  08/04/2022.  She has also been losing weight on a continuous basis.  She now presents for a 20-month office visit.  1. Non-ischemic cardiomyopathy (HCC) Patient has been losing weight, overall remains asymptomatic except for mild chronic dyspnea and no clinical evidence of heart failure.  She is presently on metoprolol succinate 200 mg daily, as she is continuously losing weight and the blood pressure is soft hopefully to improve her energy levels and also weight loss, will decrease metoprolol succinate to 100 mg daily.  LVEF has remained stable.  - EKG 12-Lead - metoprolol (TOPROL-XL) 100 MG 24 hr tablet; Take 1 tablet (100 mg total) by mouth daily.  Dispense: 90 tablet; Refill: 1 - furosemide (LASIX) 20 MG tablet; Take 1 tablet (20 mg total) by mouth every morning.  Dispense: 90 tablet; Refill: 0  2. Dyspnea on exertion Very mild, she has started to exercise on a regular basis without much difficulty.  No orthopnea, no PND, no clinical evidence of heart failure, no leg edema.  3. Primary hypertension Blood pressure in excellent control.  I have reduced the dose of metoprolol succinate to 100 mg daily from 200 mg daily as her blood pressure is soft and she has lost significant amount of weight with exercise and  diet.  My goal is to see whether we can get her off of amlodipine as well and then continue hydralazine and isosorbide only along with metoprolol succinate for nonischemic cardiomyopathy with mildly reduced LVEF.  We could also consider initiation of ACE inhibitors or ARB.  It was a pleasure to see her today.  - metoprolol (TOPROL-XL) 100 MG 24 hr tablet; Take 1 tablet (100 mg total) by mouth daily.  Dispense: 90 tablet; Refill: 1  Other orders - albuterol (VENTOLIN HFA) 108 (90 Base) MCG/ACT inhaler; Inhale 1 puff into the lungs every 6 (six) hours as needed.     Yates Decamp, MD, [facility] 09/08/2022, 8:56 PM Office: [phone]

## 2022-09-16 ENCOUNTER — Other Ambulatory Visit: Payer: Self-pay | Admitting: Cardiology

## 2022-09-16 DIAGNOSIS — E782 Mixed hyperlipidemia: Secondary | ICD-10-CM

## 2022-10-01 DIAGNOSIS — R7989 Other specified abnormal findings of blood chemistry: Secondary | ICD-10-CM | POA: Diagnosis not present

## 2022-10-01 DIAGNOSIS — R519 Headache, unspecified: Secondary | ICD-10-CM | POA: Diagnosis not present

## 2022-10-01 DIAGNOSIS — K219 Gastro-esophageal reflux disease without esophagitis: Secondary | ICD-10-CM | POA: Diagnosis not present

## 2022-10-01 DIAGNOSIS — J309 Allergic rhinitis, unspecified: Secondary | ICD-10-CM | POA: Diagnosis not present

## 2022-10-01 DIAGNOSIS — Z853 Personal history of malignant neoplasm of breast: Secondary | ICD-10-CM | POA: Diagnosis not present

## 2022-10-01 DIAGNOSIS — M159 Polyosteoarthritis, unspecified: Secondary | ICD-10-CM | POA: Diagnosis not present

## 2022-10-01 DIAGNOSIS — G4700 Insomnia, unspecified: Secondary | ICD-10-CM | POA: Diagnosis not present

## 2022-10-01 DIAGNOSIS — J45909 Unspecified asthma, uncomplicated: Secondary | ICD-10-CM | POA: Diagnosis not present

## 2022-10-01 DIAGNOSIS — N1832 Chronic kidney disease, stage 3b: Secondary | ICD-10-CM | POA: Diagnosis not present

## 2022-10-01 DIAGNOSIS — I10 Essential (primary) hypertension: Secondary | ICD-10-CM | POA: Diagnosis not present

## 2022-10-01 DIAGNOSIS — I509 Heart failure, unspecified: Secondary | ICD-10-CM | POA: Diagnosis not present

## 2022-10-01 DIAGNOSIS — D464 Refractory anemia, unspecified: Secondary | ICD-10-CM | POA: Diagnosis not present

## 2022-10-29 DIAGNOSIS — K219 Gastro-esophageal reflux disease without esophagitis: Secondary | ICD-10-CM | POA: Diagnosis not present

## 2022-10-29 DIAGNOSIS — Z853 Personal history of malignant neoplasm of breast: Secondary | ICD-10-CM | POA: Diagnosis not present

## 2022-10-29 DIAGNOSIS — G4700 Insomnia, unspecified: Secondary | ICD-10-CM | POA: Diagnosis not present

## 2022-10-29 DIAGNOSIS — M159 Polyosteoarthritis, unspecified: Secondary | ICD-10-CM | POA: Diagnosis not present

## 2022-10-29 DIAGNOSIS — J309 Allergic rhinitis, unspecified: Secondary | ICD-10-CM | POA: Diagnosis not present

## 2022-10-29 DIAGNOSIS — N1832 Chronic kidney disease, stage 3b: Secondary | ICD-10-CM | POA: Diagnosis not present

## 2022-10-29 DIAGNOSIS — J45909 Unspecified asthma, uncomplicated: Secondary | ICD-10-CM | POA: Diagnosis not present

## 2022-10-29 DIAGNOSIS — I509 Heart failure, unspecified: Secondary | ICD-10-CM | POA: Diagnosis not present

## 2022-10-29 DIAGNOSIS — R7989 Other specified abnormal findings of blood chemistry: Secondary | ICD-10-CM | POA: Diagnosis not present

## 2022-10-29 DIAGNOSIS — I10 Essential (primary) hypertension: Secondary | ICD-10-CM | POA: Diagnosis not present

## 2022-10-29 DIAGNOSIS — D464 Refractory anemia, unspecified: Secondary | ICD-10-CM | POA: Diagnosis not present

## 2022-11-11 DIAGNOSIS — J309 Allergic rhinitis, unspecified: Secondary | ICD-10-CM | POA: Diagnosis not present

## 2022-11-11 DIAGNOSIS — G4700 Insomnia, unspecified: Secondary | ICD-10-CM | POA: Diagnosis not present

## 2022-11-11 DIAGNOSIS — N1832 Chronic kidney disease, stage 3b: Secondary | ICD-10-CM | POA: Diagnosis not present

## 2022-11-11 DIAGNOSIS — J45909 Unspecified asthma, uncomplicated: Secondary | ICD-10-CM | POA: Diagnosis not present

## 2022-11-11 DIAGNOSIS — D464 Refractory anemia, unspecified: Secondary | ICD-10-CM | POA: Diagnosis not present

## 2022-11-11 DIAGNOSIS — Z79899 Other long term (current) drug therapy: Secondary | ICD-10-CM | POA: Diagnosis not present

## 2022-11-11 DIAGNOSIS — M159 Polyosteoarthritis, unspecified: Secondary | ICD-10-CM | POA: Diagnosis not present

## 2022-11-11 DIAGNOSIS — I10 Essential (primary) hypertension: Secondary | ICD-10-CM | POA: Diagnosis not present

## 2022-11-11 DIAGNOSIS — I509 Heart failure, unspecified: Secondary | ICD-10-CM | POA: Diagnosis not present

## 2022-11-11 DIAGNOSIS — K219 Gastro-esophageal reflux disease without esophagitis: Secondary | ICD-10-CM | POA: Diagnosis not present

## 2022-11-11 DIAGNOSIS — R7989 Other specified abnormal findings of blood chemistry: Secondary | ICD-10-CM | POA: Diagnosis not present

## 2022-11-24 NOTE — Telephone Encounter (Signed)
error 

## 2022-11-26 DIAGNOSIS — D464 Refractory anemia, unspecified: Secondary | ICD-10-CM | POA: Diagnosis not present

## 2022-11-26 DIAGNOSIS — Z853 Personal history of malignant neoplasm of breast: Secondary | ICD-10-CM | POA: Diagnosis not present

## 2022-11-26 DIAGNOSIS — J45909 Unspecified asthma, uncomplicated: Secondary | ICD-10-CM | POA: Diagnosis not present

## 2022-11-26 DIAGNOSIS — M159 Polyosteoarthritis, unspecified: Secondary | ICD-10-CM | POA: Diagnosis not present

## 2022-11-26 DIAGNOSIS — N1832 Chronic kidney disease, stage 3b: Secondary | ICD-10-CM | POA: Diagnosis not present

## 2022-11-26 DIAGNOSIS — R7989 Other specified abnormal findings of blood chemistry: Secondary | ICD-10-CM | POA: Diagnosis not present

## 2022-11-26 DIAGNOSIS — G4700 Insomnia, unspecified: Secondary | ICD-10-CM | POA: Diagnosis not present

## 2022-11-26 DIAGNOSIS — I10 Essential (primary) hypertension: Secondary | ICD-10-CM | POA: Diagnosis not present

## 2022-11-26 DIAGNOSIS — I509 Heart failure, unspecified: Secondary | ICD-10-CM | POA: Diagnosis not present

## 2022-11-26 DIAGNOSIS — K219 Gastro-esophageal reflux disease without esophagitis: Secondary | ICD-10-CM | POA: Diagnosis not present

## 2022-11-26 DIAGNOSIS — J309 Allergic rhinitis, unspecified: Secondary | ICD-10-CM | POA: Diagnosis not present

## 2022-12-07 ENCOUNTER — Ambulatory Visit: Payer: Medicare Other | Admitting: Cardiology

## 2022-12-15 ENCOUNTER — Other Ambulatory Visit: Payer: Self-pay | Admitting: Cardiology

## 2022-12-15 DIAGNOSIS — E782 Mixed hyperlipidemia: Secondary | ICD-10-CM

## 2022-12-24 DIAGNOSIS — G4700 Insomnia, unspecified: Secondary | ICD-10-CM | POA: Diagnosis not present

## 2022-12-24 DIAGNOSIS — M159 Polyosteoarthritis, unspecified: Secondary | ICD-10-CM | POA: Diagnosis not present

## 2022-12-24 DIAGNOSIS — I509 Heart failure, unspecified: Secondary | ICD-10-CM | POA: Diagnosis not present

## 2022-12-24 DIAGNOSIS — J45909 Unspecified asthma, uncomplicated: Secondary | ICD-10-CM | POA: Diagnosis not present

## 2022-12-24 DIAGNOSIS — Z853 Personal history of malignant neoplasm of breast: Secondary | ICD-10-CM | POA: Diagnosis not present

## 2022-12-24 DIAGNOSIS — N1832 Chronic kidney disease, stage 3b: Secondary | ICD-10-CM | POA: Diagnosis not present

## 2022-12-24 DIAGNOSIS — R7989 Other specified abnormal findings of blood chemistry: Secondary | ICD-10-CM | POA: Diagnosis not present

## 2022-12-24 DIAGNOSIS — D464 Refractory anemia, unspecified: Secondary | ICD-10-CM | POA: Diagnosis not present

## 2022-12-24 DIAGNOSIS — I10 Essential (primary) hypertension: Secondary | ICD-10-CM | POA: Diagnosis not present

## 2022-12-24 DIAGNOSIS — E7800 Pure hypercholesterolemia, unspecified: Secondary | ICD-10-CM | POA: Diagnosis not present

## 2022-12-24 DIAGNOSIS — E039 Hypothyroidism, unspecified: Secondary | ICD-10-CM | POA: Diagnosis not present

## 2022-12-24 DIAGNOSIS — K219 Gastro-esophageal reflux disease without esophagitis: Secondary | ICD-10-CM | POA: Diagnosis not present

## 2022-12-24 DIAGNOSIS — J309 Allergic rhinitis, unspecified: Secondary | ICD-10-CM | POA: Diagnosis not present

## 2023-01-21 DIAGNOSIS — K219 Gastro-esophageal reflux disease without esophagitis: Secondary | ICD-10-CM | POA: Diagnosis not present

## 2023-01-21 DIAGNOSIS — R7989 Other specified abnormal findings of blood chemistry: Secondary | ICD-10-CM | POA: Diagnosis not present

## 2023-01-21 DIAGNOSIS — Z853 Personal history of malignant neoplasm of breast: Secondary | ICD-10-CM | POA: Diagnosis not present

## 2023-01-21 DIAGNOSIS — I509 Heart failure, unspecified: Secondary | ICD-10-CM | POA: Diagnosis not present

## 2023-01-21 DIAGNOSIS — G4700 Insomnia, unspecified: Secondary | ICD-10-CM | POA: Diagnosis not present

## 2023-01-21 DIAGNOSIS — I10 Essential (primary) hypertension: Secondary | ICD-10-CM | POA: Diagnosis not present

## 2023-01-21 DIAGNOSIS — N1832 Chronic kidney disease, stage 3b: Secondary | ICD-10-CM | POA: Diagnosis not present

## 2023-01-21 DIAGNOSIS — J45909 Unspecified asthma, uncomplicated: Secondary | ICD-10-CM | POA: Diagnosis not present

## 2023-01-21 DIAGNOSIS — M159 Polyosteoarthritis, unspecified: Secondary | ICD-10-CM | POA: Diagnosis not present

## 2023-01-21 DIAGNOSIS — D464 Refractory anemia, unspecified: Secondary | ICD-10-CM | POA: Diagnosis not present

## 2023-01-21 DIAGNOSIS — J309 Allergic rhinitis, unspecified: Secondary | ICD-10-CM | POA: Diagnosis not present

## 2023-02-09 ENCOUNTER — Other Ambulatory Visit: Payer: Self-pay | Admitting: Cardiology

## 2023-02-21 DIAGNOSIS — R7989 Other specified abnormal findings of blood chemistry: Secondary | ICD-10-CM | POA: Diagnosis not present

## 2023-02-21 DIAGNOSIS — J309 Allergic rhinitis, unspecified: Secondary | ICD-10-CM | POA: Diagnosis not present

## 2023-02-21 DIAGNOSIS — G4700 Insomnia, unspecified: Secondary | ICD-10-CM | POA: Diagnosis not present

## 2023-02-21 DIAGNOSIS — I10 Essential (primary) hypertension: Secondary | ICD-10-CM | POA: Diagnosis not present

## 2023-02-21 DIAGNOSIS — J45909 Unspecified asthma, uncomplicated: Secondary | ICD-10-CM | POA: Diagnosis not present

## 2023-02-21 DIAGNOSIS — M159 Polyosteoarthritis, unspecified: Secondary | ICD-10-CM | POA: Diagnosis not present

## 2023-02-21 DIAGNOSIS — K219 Gastro-esophageal reflux disease without esophagitis: Secondary | ICD-10-CM | POA: Diagnosis not present

## 2023-02-21 DIAGNOSIS — D464 Refractory anemia, unspecified: Secondary | ICD-10-CM | POA: Diagnosis not present

## 2023-02-21 DIAGNOSIS — N1832 Chronic kidney disease, stage 3b: Secondary | ICD-10-CM | POA: Diagnosis not present

## 2023-02-21 DIAGNOSIS — Z853 Personal history of malignant neoplasm of breast: Secondary | ICD-10-CM | POA: Diagnosis not present

## 2023-02-21 DIAGNOSIS — I509 Heart failure, unspecified: Secondary | ICD-10-CM | POA: Diagnosis not present

## 2023-03-21 DIAGNOSIS — J309 Allergic rhinitis, unspecified: Secondary | ICD-10-CM | POA: Diagnosis not present

## 2023-03-21 DIAGNOSIS — K219 Gastro-esophageal reflux disease without esophagitis: Secondary | ICD-10-CM | POA: Diagnosis not present

## 2023-03-21 DIAGNOSIS — Z853 Personal history of malignant neoplasm of breast: Secondary | ICD-10-CM | POA: Diagnosis not present

## 2023-03-21 DIAGNOSIS — G4700 Insomnia, unspecified: Secondary | ICD-10-CM | POA: Diagnosis not present

## 2023-03-21 DIAGNOSIS — I509 Heart failure, unspecified: Secondary | ICD-10-CM | POA: Diagnosis not present

## 2023-03-21 DIAGNOSIS — I10 Essential (primary) hypertension: Secondary | ICD-10-CM | POA: Diagnosis not present

## 2023-03-21 DIAGNOSIS — D464 Refractory anemia, unspecified: Secondary | ICD-10-CM | POA: Diagnosis not present

## 2023-03-21 DIAGNOSIS — M159 Polyosteoarthritis, unspecified: Secondary | ICD-10-CM | POA: Diagnosis not present

## 2023-03-21 DIAGNOSIS — R7989 Other specified abnormal findings of blood chemistry: Secondary | ICD-10-CM | POA: Diagnosis not present

## 2023-03-21 DIAGNOSIS — J45909 Unspecified asthma, uncomplicated: Secondary | ICD-10-CM | POA: Diagnosis not present

## 2023-03-21 DIAGNOSIS — R519 Headache, unspecified: Secondary | ICD-10-CM | POA: Diagnosis not present

## 2023-03-31 DIAGNOSIS — M70862 Other soft tissue disorders related to use, overuse and pressure, left lower leg: Secondary | ICD-10-CM | POA: Diagnosis not present

## 2023-04-05 DIAGNOSIS — E039 Hypothyroidism, unspecified: Secondary | ICD-10-CM | POA: Diagnosis not present

## 2023-04-05 DIAGNOSIS — G4700 Insomnia, unspecified: Secondary | ICD-10-CM | POA: Diagnosis not present

## 2023-04-05 DIAGNOSIS — N1832 Chronic kidney disease, stage 3b: Secondary | ICD-10-CM | POA: Diagnosis not present

## 2023-04-05 DIAGNOSIS — I10 Essential (primary) hypertension: Secondary | ICD-10-CM | POA: Diagnosis not present

## 2023-04-05 DIAGNOSIS — M159 Polyosteoarthritis, unspecified: Secondary | ICD-10-CM | POA: Diagnosis not present

## 2023-04-05 DIAGNOSIS — E7800 Pure hypercholesterolemia, unspecified: Secondary | ICD-10-CM | POA: Diagnosis not present

## 2023-04-05 DIAGNOSIS — Z853 Personal history of malignant neoplasm of breast: Secondary | ICD-10-CM | POA: Diagnosis not present

## 2023-04-05 DIAGNOSIS — I509 Heart failure, unspecified: Secondary | ICD-10-CM | POA: Diagnosis not present

## 2023-04-05 DIAGNOSIS — J309 Allergic rhinitis, unspecified: Secondary | ICD-10-CM | POA: Diagnosis not present

## 2023-04-05 DIAGNOSIS — J45909 Unspecified asthma, uncomplicated: Secondary | ICD-10-CM | POA: Diagnosis not present

## 2023-04-05 DIAGNOSIS — K219 Gastro-esophageal reflux disease without esophagitis: Secondary | ICD-10-CM | POA: Diagnosis not present

## 2023-04-05 DIAGNOSIS — R7989 Other specified abnormal findings of blood chemistry: Secondary | ICD-10-CM | POA: Diagnosis not present

## 2023-04-05 DIAGNOSIS — D464 Refractory anemia, unspecified: Secondary | ICD-10-CM | POA: Diagnosis not present

## 2023-04-15 DIAGNOSIS — I509 Heart failure, unspecified: Secondary | ICD-10-CM | POA: Diagnosis not present

## 2023-04-15 DIAGNOSIS — N1832 Chronic kidney disease, stage 3b: Secondary | ICD-10-CM | POA: Diagnosis not present

## 2023-04-15 DIAGNOSIS — M159 Polyosteoarthritis, unspecified: Secondary | ICD-10-CM | POA: Diagnosis not present

## 2023-04-15 DIAGNOSIS — J309 Allergic rhinitis, unspecified: Secondary | ICD-10-CM | POA: Diagnosis not present

## 2023-04-15 DIAGNOSIS — J45909 Unspecified asthma, uncomplicated: Secondary | ICD-10-CM | POA: Diagnosis not present

## 2023-04-15 DIAGNOSIS — K219 Gastro-esophageal reflux disease without esophagitis: Secondary | ICD-10-CM | POA: Diagnosis not present

## 2023-04-15 DIAGNOSIS — D464 Refractory anemia, unspecified: Secondary | ICD-10-CM | POA: Diagnosis not present

## 2023-04-15 DIAGNOSIS — I10 Essential (primary) hypertension: Secondary | ICD-10-CM | POA: Diagnosis not present

## 2023-04-15 DIAGNOSIS — G4700 Insomnia, unspecified: Secondary | ICD-10-CM | POA: Diagnosis not present

## 2023-04-15 DIAGNOSIS — Z853 Personal history of malignant neoplasm of breast: Secondary | ICD-10-CM | POA: Diagnosis not present

## 2023-04-15 DIAGNOSIS — R7989 Other specified abnormal findings of blood chemistry: Secondary | ICD-10-CM | POA: Diagnosis not present

## 2023-04-18 DIAGNOSIS — I509 Heart failure, unspecified: Secondary | ICD-10-CM | POA: Diagnosis not present

## 2023-04-18 DIAGNOSIS — N1832 Chronic kidney disease, stage 3b: Secondary | ICD-10-CM | POA: Diagnosis not present

## 2023-04-18 DIAGNOSIS — J45909 Unspecified asthma, uncomplicated: Secondary | ICD-10-CM | POA: Diagnosis not present

## 2023-04-18 DIAGNOSIS — I10 Essential (primary) hypertension: Secondary | ICD-10-CM | POA: Diagnosis not present

## 2023-04-18 DIAGNOSIS — M159 Polyosteoarthritis, unspecified: Secondary | ICD-10-CM | POA: Diagnosis not present

## 2023-04-18 DIAGNOSIS — J309 Allergic rhinitis, unspecified: Secondary | ICD-10-CM | POA: Diagnosis not present

## 2023-04-18 DIAGNOSIS — R7989 Other specified abnormal findings of blood chemistry: Secondary | ICD-10-CM | POA: Diagnosis not present

## 2023-04-18 DIAGNOSIS — K219 Gastro-esophageal reflux disease without esophagitis: Secondary | ICD-10-CM | POA: Diagnosis not present

## 2023-04-18 DIAGNOSIS — D464 Refractory anemia, unspecified: Secondary | ICD-10-CM | POA: Diagnosis not present

## 2023-04-18 DIAGNOSIS — G4700 Insomnia, unspecified: Secondary | ICD-10-CM | POA: Diagnosis not present

## 2023-04-18 DIAGNOSIS — Z853 Personal history of malignant neoplasm of breast: Secondary | ICD-10-CM | POA: Diagnosis not present

## 2023-05-02 DIAGNOSIS — Z853 Personal history of malignant neoplasm of breast: Secondary | ICD-10-CM | POA: Diagnosis not present

## 2023-05-02 DIAGNOSIS — G4700 Insomnia, unspecified: Secondary | ICD-10-CM | POA: Diagnosis not present

## 2023-05-02 DIAGNOSIS — K219 Gastro-esophageal reflux disease without esophagitis: Secondary | ICD-10-CM | POA: Diagnosis not present

## 2023-05-02 DIAGNOSIS — I509 Heart failure, unspecified: Secondary | ICD-10-CM | POA: Diagnosis not present

## 2023-05-02 DIAGNOSIS — I10 Essential (primary) hypertension: Secondary | ICD-10-CM | POA: Diagnosis not present

## 2023-05-02 DIAGNOSIS — J309 Allergic rhinitis, unspecified: Secondary | ICD-10-CM | POA: Diagnosis not present

## 2023-05-02 DIAGNOSIS — D464 Refractory anemia, unspecified: Secondary | ICD-10-CM | POA: Diagnosis not present

## 2023-05-02 DIAGNOSIS — M159 Polyosteoarthritis, unspecified: Secondary | ICD-10-CM | POA: Diagnosis not present

## 2023-05-02 DIAGNOSIS — R7989 Other specified abnormal findings of blood chemistry: Secondary | ICD-10-CM | POA: Diagnosis not present

## 2023-05-02 DIAGNOSIS — N1832 Chronic kidney disease, stage 3b: Secondary | ICD-10-CM | POA: Diagnosis not present

## 2023-05-02 DIAGNOSIS — J45909 Unspecified asthma, uncomplicated: Secondary | ICD-10-CM | POA: Diagnosis not present

## 2023-05-07 DIAGNOSIS — R7989 Other specified abnormal findings of blood chemistry: Secondary | ICD-10-CM | POA: Diagnosis not present

## 2023-05-07 DIAGNOSIS — K8020 Calculus of gallbladder without cholecystitis without obstruction: Secondary | ICD-10-CM | POA: Diagnosis not present

## 2023-05-09 DIAGNOSIS — K8020 Calculus of gallbladder without cholecystitis without obstruction: Secondary | ICD-10-CM | POA: Diagnosis not present

## 2023-05-09 DIAGNOSIS — R7989 Other specified abnormal findings of blood chemistry: Secondary | ICD-10-CM | POA: Diagnosis not present

## 2023-05-16 DIAGNOSIS — G4700 Insomnia, unspecified: Secondary | ICD-10-CM | POA: Diagnosis not present

## 2023-05-16 DIAGNOSIS — I10 Essential (primary) hypertension: Secondary | ICD-10-CM | POA: Diagnosis not present

## 2023-05-16 DIAGNOSIS — D464 Refractory anemia, unspecified: Secondary | ICD-10-CM | POA: Diagnosis not present

## 2023-05-16 DIAGNOSIS — I509 Heart failure, unspecified: Secondary | ICD-10-CM | POA: Diagnosis not present

## 2023-05-16 DIAGNOSIS — J45909 Unspecified asthma, uncomplicated: Secondary | ICD-10-CM | POA: Diagnosis not present

## 2023-05-16 DIAGNOSIS — J309 Allergic rhinitis, unspecified: Secondary | ICD-10-CM | POA: Diagnosis not present

## 2023-05-16 DIAGNOSIS — K219 Gastro-esophageal reflux disease without esophagitis: Secondary | ICD-10-CM | POA: Diagnosis not present

## 2023-05-16 DIAGNOSIS — M159 Polyosteoarthritis, unspecified: Secondary | ICD-10-CM | POA: Diagnosis not present

## 2023-05-16 DIAGNOSIS — Z853 Personal history of malignant neoplasm of breast: Secondary | ICD-10-CM | POA: Diagnosis not present

## 2023-05-16 DIAGNOSIS — N1832 Chronic kidney disease, stage 3b: Secondary | ICD-10-CM | POA: Diagnosis not present

## 2023-05-16 DIAGNOSIS — R7989 Other specified abnormal findings of blood chemistry: Secondary | ICD-10-CM | POA: Diagnosis not present

## 2023-06-13 DIAGNOSIS — K219 Gastro-esophageal reflux disease without esophagitis: Secondary | ICD-10-CM | POA: Diagnosis not present

## 2023-06-13 DIAGNOSIS — Z853 Personal history of malignant neoplasm of breast: Secondary | ICD-10-CM | POA: Diagnosis not present

## 2023-06-13 DIAGNOSIS — R7989 Other specified abnormal findings of blood chemistry: Secondary | ICD-10-CM | POA: Diagnosis not present

## 2023-06-13 DIAGNOSIS — I10 Essential (primary) hypertension: Secondary | ICD-10-CM | POA: Diagnosis not present

## 2023-06-13 DIAGNOSIS — M159 Polyosteoarthritis, unspecified: Secondary | ICD-10-CM | POA: Diagnosis not present

## 2023-06-13 DIAGNOSIS — G4700 Insomnia, unspecified: Secondary | ICD-10-CM | POA: Diagnosis not present

## 2023-06-13 DIAGNOSIS — N1832 Chronic kidney disease, stage 3b: Secondary | ICD-10-CM | POA: Diagnosis not present

## 2023-06-13 DIAGNOSIS — J309 Allergic rhinitis, unspecified: Secondary | ICD-10-CM | POA: Diagnosis not present

## 2023-06-13 DIAGNOSIS — J45909 Unspecified asthma, uncomplicated: Secondary | ICD-10-CM | POA: Diagnosis not present

## 2023-06-13 DIAGNOSIS — D464 Refractory anemia, unspecified: Secondary | ICD-10-CM | POA: Diagnosis not present

## 2023-06-13 DIAGNOSIS — I509 Heart failure, unspecified: Secondary | ICD-10-CM | POA: Diagnosis not present

## 2023-07-11 DIAGNOSIS — M159 Polyosteoarthritis, unspecified: Secondary | ICD-10-CM | POA: Diagnosis not present

## 2023-07-11 DIAGNOSIS — I10 Essential (primary) hypertension: Secondary | ICD-10-CM | POA: Diagnosis not present

## 2023-07-11 DIAGNOSIS — Z853 Personal history of malignant neoplasm of breast: Secondary | ICD-10-CM | POA: Diagnosis not present

## 2023-07-11 DIAGNOSIS — G4700 Insomnia, unspecified: Secondary | ICD-10-CM | POA: Diagnosis not present

## 2023-07-11 DIAGNOSIS — J45909 Unspecified asthma, uncomplicated: Secondary | ICD-10-CM | POA: Diagnosis not present

## 2023-07-11 DIAGNOSIS — K219 Gastro-esophageal reflux disease without esophagitis: Secondary | ICD-10-CM | POA: Diagnosis not present

## 2023-07-11 DIAGNOSIS — J309 Allergic rhinitis, unspecified: Secondary | ICD-10-CM | POA: Diagnosis not present

## 2023-07-11 DIAGNOSIS — I509 Heart failure, unspecified: Secondary | ICD-10-CM | POA: Diagnosis not present

## 2023-07-11 DIAGNOSIS — D464 Refractory anemia, unspecified: Secondary | ICD-10-CM | POA: Diagnosis not present

## 2023-07-11 DIAGNOSIS — R7989 Other specified abnormal findings of blood chemistry: Secondary | ICD-10-CM | POA: Diagnosis not present

## 2023-07-11 DIAGNOSIS — N1832 Chronic kidney disease, stage 3b: Secondary | ICD-10-CM | POA: Diagnosis not present

## 2023-08-08 DIAGNOSIS — M159 Polyosteoarthritis, unspecified: Secondary | ICD-10-CM | POA: Diagnosis not present

## 2023-08-08 DIAGNOSIS — E039 Hypothyroidism, unspecified: Secondary | ICD-10-CM | POA: Diagnosis not present

## 2023-08-08 DIAGNOSIS — R7989 Other specified abnormal findings of blood chemistry: Secondary | ICD-10-CM | POA: Diagnosis not present

## 2023-08-08 DIAGNOSIS — E7800 Pure hypercholesterolemia, unspecified: Secondary | ICD-10-CM | POA: Diagnosis not present

## 2023-08-08 DIAGNOSIS — I10 Essential (primary) hypertension: Secondary | ICD-10-CM | POA: Diagnosis not present

## 2023-08-08 DIAGNOSIS — G4700 Insomnia, unspecified: Secondary | ICD-10-CM | POA: Diagnosis not present

## 2023-08-25 ENCOUNTER — Other Ambulatory Visit: Payer: Self-pay | Admitting: Cardiology

## 2023-09-02 ENCOUNTER — Other Ambulatory Visit: Payer: Self-pay | Admitting: Cardiology

## 2023-09-02 DIAGNOSIS — E782 Mixed hyperlipidemia: Secondary | ICD-10-CM

## 2023-09-05 DIAGNOSIS — J309 Allergic rhinitis, unspecified: Secondary | ICD-10-CM | POA: Diagnosis not present

## 2023-09-05 DIAGNOSIS — K219 Gastro-esophageal reflux disease without esophagitis: Secondary | ICD-10-CM | POA: Diagnosis not present

## 2023-09-05 DIAGNOSIS — M159 Polyosteoarthritis, unspecified: Secondary | ICD-10-CM | POA: Diagnosis not present

## 2023-09-05 DIAGNOSIS — G4700 Insomnia, unspecified: Secondary | ICD-10-CM | POA: Diagnosis not present

## 2023-09-05 DIAGNOSIS — J45909 Unspecified asthma, uncomplicated: Secondary | ICD-10-CM | POA: Diagnosis not present

## 2023-09-05 DIAGNOSIS — I10 Essential (primary) hypertension: Secondary | ICD-10-CM | POA: Diagnosis not present

## 2023-09-05 DIAGNOSIS — R7989 Other specified abnormal findings of blood chemistry: Secondary | ICD-10-CM | POA: Diagnosis not present

## 2023-09-05 DIAGNOSIS — D464 Refractory anemia, unspecified: Secondary | ICD-10-CM | POA: Diagnosis not present

## 2023-09-05 DIAGNOSIS — L508 Other urticaria: Secondary | ICD-10-CM | POA: Diagnosis not present

## 2023-09-05 DIAGNOSIS — N1832 Chronic kidney disease, stage 3b: Secondary | ICD-10-CM | POA: Diagnosis not present

## 2023-10-03 DIAGNOSIS — N1832 Chronic kidney disease, stage 3b: Secondary | ICD-10-CM | POA: Diagnosis not present

## 2023-10-03 DIAGNOSIS — R7989 Other specified abnormal findings of blood chemistry: Secondary | ICD-10-CM | POA: Diagnosis not present

## 2023-10-03 DIAGNOSIS — D464 Refractory anemia, unspecified: Secondary | ICD-10-CM | POA: Diagnosis not present

## 2023-10-03 DIAGNOSIS — M159 Polyosteoarthritis, unspecified: Secondary | ICD-10-CM | POA: Diagnosis not present

## 2023-10-03 DIAGNOSIS — I10 Essential (primary) hypertension: Secondary | ICD-10-CM | POA: Diagnosis not present

## 2023-10-03 DIAGNOSIS — K219 Gastro-esophageal reflux disease without esophagitis: Secondary | ICD-10-CM | POA: Diagnosis not present

## 2023-10-03 DIAGNOSIS — G4700 Insomnia, unspecified: Secondary | ICD-10-CM | POA: Diagnosis not present

## 2023-10-03 DIAGNOSIS — J45909 Unspecified asthma, uncomplicated: Secondary | ICD-10-CM | POA: Diagnosis not present

## 2023-10-03 DIAGNOSIS — Z79899 Other long term (current) drug therapy: Secondary | ICD-10-CM | POA: Diagnosis not present

## 2023-10-03 DIAGNOSIS — J309 Allergic rhinitis, unspecified: Secondary | ICD-10-CM | POA: Diagnosis not present

## 2023-10-18 ENCOUNTER — Ambulatory Visit: Attending: Internal Medicine | Admitting: Cardiology

## 2023-10-18 ENCOUNTER — Telehealth: Payer: Self-pay | Admitting: *Deleted

## 2023-10-18 ENCOUNTER — Encounter: Payer: Self-pay | Admitting: Cardiology

## 2023-10-18 VITALS — BP 131/79 | HR 76 | Resp 16 | Ht <= 58 in | Wt 158.0 lb

## 2023-10-18 DIAGNOSIS — E782 Mixed hyperlipidemia: Secondary | ICD-10-CM | POA: Diagnosis not present

## 2023-10-18 DIAGNOSIS — I428 Other cardiomyopathies: Secondary | ICD-10-CM

## 2023-10-18 DIAGNOSIS — I129 Hypertensive chronic kidney disease with stage 1 through stage 4 chronic kidney disease, or unspecified chronic kidney disease: Secondary | ICD-10-CM | POA: Diagnosis not present

## 2023-10-18 DIAGNOSIS — N183 Chronic kidney disease, stage 3 unspecified: Secondary | ICD-10-CM

## 2023-10-18 DIAGNOSIS — I10 Essential (primary) hypertension: Secondary | ICD-10-CM

## 2023-10-18 MED ORDER — EMPAGLIFLOZIN 10 MG PO TABS: 10.0000 mg | ORAL_TABLET | Freq: Every day | ORAL | 2 refills | Status: DC

## 2023-10-18 MED ORDER — ROSUVASTATIN CALCIUM 20 MG PO TABS
20.0000 mg | ORAL_TABLET | Freq: Every day | ORAL | 3 refills | Status: AC
Start: 2023-10-18 — End: ?

## 2023-10-18 MED ORDER — EMPAGLIFLOZIN 10 MG PO TABS: 10.0000 mg | ORAL_TABLET | Freq: Every day | ORAL | 0 refills | Status: DC

## 2023-10-18 MED ORDER — HYDRALAZINE HCL 50 MG PO TABS: 50.0000 mg | ORAL_TABLET | Freq: Three times a day (TID) | ORAL | 3 refills | Status: AC

## 2023-10-18 MED ORDER — NITROGLYCERIN 0.4 MG SL SUBL: 0.4000 mg | SUBLINGUAL_TABLET | SUBLINGUAL | 2 refills | Status: AC | PRN

## 2023-10-18 MED ORDER — SPIRONOLACTONE 25 MG PO TABS: 25.0000 mg | ORAL_TABLET | Freq: Every morning | ORAL | 1 refills | Status: DC

## 2023-10-18 MED ORDER — FUROSEMIDE 40 MG PO TABS: 40.0000 mg | ORAL_TABLET | Freq: Every day | ORAL | 1 refills | Status: AC | PRN

## 2023-10-18 NOTE — Telephone Encounter (Signed)
 Jardiance  10 mg by mouth daily samples for 28 days is given.

## 2023-10-18 NOTE — Progress Notes (Signed)
 Cardiology Office Note:  .   Date:  10/18/2023  ID:  [name], DOB April 15, 1960, MRN [id number] PCP: [name] Chow, MD  [facility] Providers Cardiologist:  None   History of Present Illness: .   [name] is a 64 y.o.  African-American female patient was diagnosed with nonischemic cardiomyopathy diagnosed in May 2012 at which time she had normal coronary arteries and EF 30 to 35% improved to 45-50% by echocardiogram on 08/31/2022 with medical management, hypertension, hyperlipidemia, morbid obesity, breast invasive ductal carcinoma SP lumpectomy on 07/16/2011 followed by chemotherapy and radiation therapy to the chest, tobacco use disorder, stage IIIa chronic kidney disease, presents for annual visit.  She is doing well and has lost significant amount of weight and now her BMI is 32 and she continues to exercise regularly without symptoms of dyspnea or chest pain.  She feels safe having nitroglycerin  with her and she takes it when she gets anxious or she feels heaviness in the chest.  Discussed the use of AI scribe software for clinical note transcription with the patient, who gave verbal consent to proceed.  History of Present Illness [name] is a 63 year old female with mild kidney disease and hypertension who presents for a cardiovascular follow-up.  She experiences feeling hot and mood changes, particularly at the gym, and frequently requests a fan to cool down. She uses nitroglycerin  when feeling overwhelmed or seeing spots, especially when overheated. There is no shortness of breath, leg swelling, or nocturnal dyspnea.  Her serum creatinine is 1.54, slightly increased from 1.46 last year. BUN is 32, and potassium is 5.2. She takes spironolactone  50 mg and furosemide  twice daily.  She has lost 35 to 40 pounds, reducing her weight from nearly 200 pounds to 158 pounds, with a significant decrease in BMI.  Her EKG remains unchanged from last year, and heart function is between 45  to 50 percent. LDL cholesterol is slightly elevated at 104-105, and she takes rosuvastatin . Thyroid  function is normal despite a low TSH.  She has quit smoking, motivated by her best friend's mother's death from leukemia related to secondhand smoke.  Labs  External Labs:  Care everywhere labs 08/08/2023:  Serum glucose 91 mg, BUN 32, creatinine 1.54, EGFR 38 mL, potassium 5.2, LFTs normal.  Total cholesterol 195, triglycerides 54, HDL 80, LDL 105.  TSH reduced at 0.104, T4 and T3 uptake and free thyroxine index normal.  Hb 12.8/HCT 41.2, platelets 193.  Labs 04/15/2023:  Serum glucose 89 mg, BUN 21, creatinine 1.45, EGFR 41 mL.  ROS  Review of Systems  Cardiovascular:  Negative for chest pain, dyspnea on exertion and leg swelling.   Physical Exam:   VS:  BP 131/79 (BP Location: Right Arm, Patient Position: Sitting, Cuff Size: Normal)   Pulse 76   Resp 16   Ht 4' 9 (1.448 m)   Wt 158 lb (71.7 kg)   SpO2 98%   BMI 34.19 kg/m    Wt Readings from Last 3 Encounters:  10/18/23 158 lb (71.7 kg)  09/07/22 161 lb (73 kg)  06/21/22 171 lb (77.6 kg)    Physical Exam Neck:     Vascular: No carotid bruit or JVD.  Cardiovascular:     Rate and Rhythm: Normal rate and regular rhythm.     Pulses: Intact distal pulses.     Heart sounds: Normal heart sounds. No murmur heard.    No gallop.  Pulmonary:     Effort: Pulmonary effort  is normal.     Breath sounds: Normal breath sounds.  Abdominal:     General: Bowel sounds are normal.     Palpations: Abdomen is soft.  Musculoskeletal:     Right lower leg: No edema.     Left lower leg: No edema.    Studies Reviewed: SABRA     EKG:    EKG Interpretation Date/Time:  Tuesday October 18 2023 16:58:28 EDT Ventricular Rate:  58 PR Interval:  176 QRS Duration:  118 QT Interval:  454 QTC Calculation: 445 R Axis:   -18  Text Interpretation: EKG 10/18/2023: Normal sinus rhythm at the rate of 58 bpm, IVCD, LVH.  No evidence of ischemia.   Compared to 06/21/2022, no significant change. Confirmed by Erandi Lemma, Jagadeesh ([id number]) on 10/18/2023 5:09:39 PM    Medications ordered    Meds ordered this encounter  Medications   spironolactone  (ALDACTONE ) 25 MG tablet    Sig: Take 1 tablet (25 mg total) by mouth every morning.    Dispense:  90 tablet    Refill:  1   furosemide  (LASIX ) 40 MG tablet    Sig: Take 1 tablet (40 mg total) by mouth daily as needed for edema or fluid.    Dispense:  90 tablet    Refill:  1   empagliflozin  (JARDIANCE ) 10 MG TABS tablet    Sig: Take 1 tablet (10 mg total) by mouth daily.    Dispense:  30 tablet    Refill:  2   hydrALAZINE  (APRESOLINE ) 50 MG tablet    Sig: Take 1 tablet (50 mg total) by mouth 3 (three) times daily.    Dispense:  270 tablet    Refill:  3   nitroGLYCERIN  (NITROSTAT ) 0.4 MG SL tablet    Sig: Place 1 tablet (0.4 mg total) under the tongue every 5 (five) minutes as needed for chest pain.    Dispense:  25 tablet    Refill:  2    Pt must schedule an overdue followup appt with Cardiology for any more refills. [phone] 1st attempt Thank You   rosuvastatin  (CRESTOR ) 20 MG tablet    Sig: Take 1 tablet (20 mg total) by mouth daily.    Dispense:  30 tablet    Refill:  3    Dose increase     ASSESSMENT AND PLAN: .      ICD-10-CM   1. Non-ischemic cardiomyopathy (HCC)  I42.8 EKG 12-Lead    furosemide  (LASIX ) 40 MG tablet    Basic Metabolic Panel (BMET)    ECHOCARDIOGRAM COMPLETE    2. Primary hypertension  I10 spironolactone  (ALDACTONE ) 25 MG tablet    hydrALAZINE  (APRESOLINE ) 50 MG tablet    Basic Metabolic Panel (BMET)    3. Mixed hyperlipidemia  E78.2 Lipid Profile    4. 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 Elevated potassium levels (5.2) require dietary modifications. Jardiance  is introduced to aid in weight loss and protect kidney function, with a potential risk of urinary tract infections due to increased sugar excretion in urine. - Change spironolactone  dose from 50 mg to 25 mg to reassess kidney function. - Reduce furosemide  to once a day or as needed based on symptoms such as  weight gain or leg swelling from 40 mg twice daily. - Start Jardiance  to aid in weight loss and protect kidney function. - Avoid high potassium foods and drinks, including certain juices. - Educate on signs of urinary tract infection due to Jardiance  use and advise on proper hygiene practices.  Heart failure with mildly reduced ejection fraction Ejection fraction is between 45-50%, below the normal range of 55%. S No clinical evidence of heart failure.  She is presently doing well and has lost significant amount of weight from morbid obesity to the present BMI of 32.  Suspect EF is probably much improved.  Will repeat echocardiogram.  The EKG shows no changes from the previous year. - Refill hydralazine  50 mg 3 times daily - Reduce spironolactone  from 50 to 25 mg daily in view of renal failure - Continue Imdur  60 mg daily - Metoprolol  succinate 100 mg daily.  Not on ACE inhibitors or ARB due to renal insufficiency.  Constipation Chronic constipation possibly exacerbated by amlodipine  use. Reports difficulty with bowel movements unless using laxatives. Currently using Miralax  daily. - Discontinue amlodipine  to assess impact on constipation. - Continue Miralax  daily for bowel regulation. - Follow-up on blood pressure since discontinuing amlodipine .  Weight loss will certainly help.  Hopefully Jardiance  will also help with blood pressure control.  Hyperlipidemia LDL cholesterol is slightly elevated at 104-105 mg/dL. The goal is to reduce LDL to below 100 mg/dL. Currently on rosuvastatin  10  mg daily. - Continue rosuvastatin  and increase to 20 mg for cholesterol management. - Encourage dietary modifications to further reduce LDL levels.  Signed,  Gordy Bergamo, MD, [facility] 10/18/2023, 7:00 PM [facility] [address] [place], [place] [address] Phone: [phone]. Fax:  [phone]

## 2023-10-18 NOTE — Telephone Encounter (Signed)
 Medication name/dosage: Samples List: Jardiance  10 mg  Administration instructions: Take one tablet by mouth daily   Reason for samples: Reason for samples: new start  Ordering provider: Dr Ladona  *Once above information entered, route the phone encounter to CV DIV MAG ST SAMPLES and send Teams message to team member assigned to Samples for the day.

## 2023-10-18 NOTE — Patient Instructions (Addendum)
 Medication Instructions:  Your physician has recommended you make the following change in your medication:  Start Jardiance  10 mg by mouth daily  Decrease spironolactone  to 25 mg by mouth daily  Decrease furosemide  to 40 mg by mouth daily as needed Increase Rosuvastatin  to 20 mg by mouth daily   *If you need a refill on your cardiac medications before your next appointment, please call your pharmacy*  Lab Work: Have lab work done in 1 month.  --BMP and Lipids.  Can be done at any [facility] location.  This will be fasting If you have labs (blood work) drawn today and your tests are completely normal, you will receive your results only by: MyChart Message (if you have MyChart) OR A paper copy in the mail If you have any lab test that is abnormal or we need to change your treatment, we will call you to review the results.  Testing/Procedures: Your physician has requested that you have an echocardiogram. Echocardiography is a painless test that uses sound waves to create images of your heart. It provides your doctor with information about the size and shape of your heart and how well your heart's chambers and valves are working. This procedure takes approximately one hour. There are no restrictions for this procedure. Please do NOT wear cologne, perfume, aftershave, or lotions (deodorant is allowed). Please arrive 15 minutes prior to your appointment time.  Please note: We ask at that you not bring children with you during ultrasound (echo/ vascular) testing. Due to room size and safety concerns, children are not allowed in the ultrasound rooms during exams. Our front office staff cannot provide observation of children in our lobby area while testing is being conducted. An adult accompanying a patient to their appointment will only be allowed in the ultrasound room at the discretion of the ultrasound technician under special circumstances. We apologize for any inconvenience.   Follow-Up: At [facility], you and your health needs are our priority.  As part of our continuing mission to provide you with exceptional heart care, our providers are all part of one team.  This team includes your primary Cardiologist (physician) and Advanced Practice Providers or APPs (Physician Assistants and Nurse Practitioners) who all work together to provide you with the care you need, when you need it.  Your next appointment:   9/26 at 11:20  Provider:   Dr Ladona    We recommend signing up for the patient portal called MyChart.  Sign up information is provided on this After Visit Summary.  MyChart is used to connect with patients for Virtual Visits (Telemedicine).  Patients are able to view lab/test results, encounter notes, upcoming appointments, etc.  Non-urgent messages can be sent to your provider as well.   To learn more about what you can do with MyChart, go to [email or web address].   Other Instructions

## 2023-10-31 DIAGNOSIS — R7989 Other specified abnormal findings of blood chemistry: Secondary | ICD-10-CM | POA: Diagnosis not present

## 2023-10-31 DIAGNOSIS — J45909 Unspecified asthma, uncomplicated: Secondary | ICD-10-CM | POA: Diagnosis not present

## 2023-10-31 DIAGNOSIS — Z853 Personal history of malignant neoplasm of breast: Secondary | ICD-10-CM | POA: Diagnosis not present

## 2023-10-31 DIAGNOSIS — I10 Essential (primary) hypertension: Secondary | ICD-10-CM | POA: Diagnosis not present

## 2023-10-31 DIAGNOSIS — D464 Refractory anemia, unspecified: Secondary | ICD-10-CM | POA: Diagnosis not present

## 2023-10-31 DIAGNOSIS — K219 Gastro-esophageal reflux disease without esophagitis: Secondary | ICD-10-CM | POA: Diagnosis not present

## 2023-10-31 DIAGNOSIS — M159 Polyosteoarthritis, unspecified: Secondary | ICD-10-CM | POA: Diagnosis not present

## 2023-10-31 DIAGNOSIS — J309 Allergic rhinitis, unspecified: Secondary | ICD-10-CM | POA: Diagnosis not present

## 2023-10-31 DIAGNOSIS — N1832 Chronic kidney disease, stage 3b: Secondary | ICD-10-CM | POA: Diagnosis not present

## 2023-10-31 DIAGNOSIS — G4700 Insomnia, unspecified: Secondary | ICD-10-CM | POA: Diagnosis not present

## 2023-10-31 DIAGNOSIS — E7800 Pure hypercholesterolemia, unspecified: Secondary | ICD-10-CM | POA: Diagnosis not present

## 2023-10-31 DIAGNOSIS — I509 Heart failure, unspecified: Secondary | ICD-10-CM | POA: Diagnosis not present

## 2023-11-06 ENCOUNTER — Other Ambulatory Visit: Payer: Self-pay | Admitting: Cardiology

## 2023-11-25 ENCOUNTER — Ambulatory Visit: Payer: Self-pay | Admitting: Cardiology

## 2023-11-25 ENCOUNTER — Ambulatory Visit (HOSPITAL_COMMUNITY)
Admission: RE | Admit: 2023-11-25 | Discharge: 2023-11-25 | Disposition: A | Discharge status: Home / Self Care | Source: Ambulatory Visit | Attending: Cardiology | Admitting: Cardiology

## 2023-11-25 DIAGNOSIS — I428 Other cardiomyopathies: Secondary | ICD-10-CM | POA: Diagnosis not present

## 2023-11-25 LAB — ECHOCARDIOGRAM COMPLETE
Area-P 1/2: 3.17 cm2
S' Lateral: 3.6 cm

## 2023-11-25 NOTE — Progress Notes (Signed)
 Echocardiogram is stable from 09/13/2022 with mildly depressed LV systolic function.  We will discuss on office visit soon.

## 2023-11-28 DIAGNOSIS — I509 Heart failure, unspecified: Secondary | ICD-10-CM | POA: Diagnosis not present

## 2023-11-28 DIAGNOSIS — K219 Gastro-esophageal reflux disease without esophagitis: Secondary | ICD-10-CM | POA: Diagnosis not present

## 2023-11-28 DIAGNOSIS — I10 Essential (primary) hypertension: Secondary | ICD-10-CM | POA: Diagnosis not present

## 2023-11-28 DIAGNOSIS — M159 Polyosteoarthritis, unspecified: Secondary | ICD-10-CM | POA: Diagnosis not present

## 2023-11-28 DIAGNOSIS — D464 Refractory anemia, unspecified: Secondary | ICD-10-CM | POA: Diagnosis not present

## 2023-11-28 DIAGNOSIS — J309 Allergic rhinitis, unspecified: Secondary | ICD-10-CM | POA: Diagnosis not present

## 2023-11-28 DIAGNOSIS — L237 Allergic contact dermatitis due to plants, except food: Secondary | ICD-10-CM | POA: Diagnosis not present

## 2023-11-28 DIAGNOSIS — R7989 Other specified abnormal findings of blood chemistry: Secondary | ICD-10-CM | POA: Diagnosis not present

## 2023-11-28 DIAGNOSIS — G4700 Insomnia, unspecified: Secondary | ICD-10-CM | POA: Diagnosis not present

## 2023-11-28 DIAGNOSIS — Z853 Personal history of malignant neoplasm of breast: Secondary | ICD-10-CM | POA: Diagnosis not present

## 2023-11-28 DIAGNOSIS — N1832 Chronic kidney disease, stage 3b: Secondary | ICD-10-CM | POA: Diagnosis not present

## 2023-12-16 ENCOUNTER — Other Ambulatory Visit (HOSPITAL_COMMUNITY): Payer: Self-pay

## 2023-12-16 ENCOUNTER — Encounter: Payer: Self-pay | Admitting: Cardiology

## 2023-12-16 ENCOUNTER — Ambulatory Visit: Attending: Cardiology | Admitting: Cardiology

## 2023-12-16 ENCOUNTER — Telehealth: Payer: Self-pay | Admitting: *Deleted

## 2023-12-16 ENCOUNTER — Telehealth: Payer: Self-pay | Admitting: Pharmacy Technician

## 2023-12-16 VITALS — BP 131/72 | HR 64 | Resp 16 | Ht <= 58 in | Wt 155.4 lb

## 2023-12-16 DIAGNOSIS — I129 Hypertensive chronic kidney disease with stage 1 through stage 4 chronic kidney disease, or unspecified chronic kidney disease: Secondary | ICD-10-CM

## 2023-12-16 DIAGNOSIS — I5022 Chronic systolic (congestive) heart failure: Secondary | ICD-10-CM

## 2023-12-16 DIAGNOSIS — N183 Chronic kidney disease, stage 3 unspecified: Secondary | ICD-10-CM | POA: Diagnosis not present

## 2023-12-16 DIAGNOSIS — I10 Essential (primary) hypertension: Secondary | ICD-10-CM

## 2023-12-16 DIAGNOSIS — I428 Other cardiomyopathies: Secondary | ICD-10-CM

## 2023-12-16 NOTE — Telephone Encounter (Signed)
 Patient saw Dr Ladona today and is requesting assistance with cost of Jardiance 

## 2023-12-16 NOTE — Patient Instructions (Signed)
 Medication Instructions:  Your physician recommends that you continue on your current medications as directed. Please refer to the Current Medication list given to you today.  *If you need a refill on your cardiac medications before your next appointment, please call your pharmacy*  Lab Work: Have lab work --BMP drawn today in the lab on the first floor If you have labs (blood work) drawn today and your tests are completely normal, you will receive your results only by: MyChart Message (if you have MyChart) OR A paper copy in the mail If you have any lab test that is abnormal or we need to change your treatment, we will call you to review the results.  Testing/Procedures: none  Follow-Up: At [facility], you and your health needs are our priority.  As part of our continuing mission to provide you with exceptional heart care, our providers are all part of one team.  This team includes your primary Cardiologist (physician) and Advanced Practice Providers or APPs (Physician Assistants and Nurse Practitioners) who all work together to provide you with the care you need, when you need it.  Your next appointment:   12 month(s)  Provider:   Gordy Bergamo, MD    We recommend signing up for the patient portal called MyChart.  Sign up information is provided on this After Visit Summary.  MyChart is used to connect with patients for Virtual Visits (Telemedicine).  Patients are able to view lab/test results, encounter notes, upcoming appointments, etc.  Non-urgent messages can be sent to your provider as well.   To learn more about what you can do with MyChart, go to [email or web address].   Other Instructions    Our office will contact you regarding possible Jardiance  assistance

## 2023-12-16 NOTE — Telephone Encounter (Signed)
   Patient Advocate Encounter   The patient was approved for a [employer] grant that will help cover the cost of jardiance  Total amount awarded, 7500.  Effective: 11/16/23 - 11/14/24   APW:[id number] ERW:[id number] Group:[id number] PI:[id number] [employer] ID: [id number]   Pharmacy provided with approval and processing information. Patient informed via telephone

## 2023-12-16 NOTE — Progress Notes (Signed)
 Cardiology Office Note:  .   Date:  12/18/2023  ID:  [name], DOB 03-01-1961, MRN [id number] PCP: [name] Chow, MD  [facility] Providers Cardiologist:  Gordy Bergamo, MD   History of Present Illness: .   [name] is a 63 y.o.  s: .   [name] is a 63 y.o.  African-American female patient was diagnosed with nonischemic cardiomyopathy diagnosed in May 2012 at which time she had normal coronary arteries and EF 30 to 35% improved to 45-50% by echocardiogram on 08/31/2022 with medical management, hypertension, hyperlipidemia, breast invasive ductal carcinoma SP lumpectomy on 07/16/2011 followed by chemotherapy and radiation therapy to the chest, tobacco use disorder, stage IIIa chronic kidney disease, quit smoking  07/2022 ago and has remained abstinent, has also been losing weight with diet and exercise, presents here for 35-month office visit, underwent echocardiogram.    I also started her on Jardiance  for renal protection and chronic heart failure with mildly reduced EF.  I reduced the dose of spironolactone  from 50 to 25 mg on her last office visit in July 25.  Cardiac Studies relevent.    ECHOCARDIOGRAM COMPLETE 11/25/2023  1. Prominent apical trabeculations. Left ventricular ejection fraction, by estimation, is 45 to 50%. The left ventricle has mildly decreased function. The left ventricle demonstrates global hypokinesis. The left ventricular internal cavity size was mildly dilated. Left ventricular diastolic parameters are consistent with Grade I diastolic dysfunction (impaired relaxation). The average left ventricular global longitudinal strain is -21.4 %. The global longitudinal strain is normal. 2. Right ventricular systolic function is normal. The right ventricular size is normal.    Discussed the use of AI scribe software for clinical note transcription with the patient, who gave verbal consent to proceed.  History of Present Illness [name] is a 63 year old female with  non-ischemic cardiomyopathy who presents for a cardiovascular follow-up.  Her medication regimen includes metoprolol , hydralazine , spironolactone , amlodipine , and furosemide , which she takes daily despite it being prescribed as needed. She is concerned about affording Jardiance  due to increased rent and is interested in a free assistance program.  She has lost 48 pounds over the past year, maintains a diet including V8 juice and eggs, and exercises at the gym five days a week. She quit smoking over a year ago.  She plans to have fasting blood work, including a kidney function test, done soon.  Labs   Lab Results  Component Value Date   NA 141 12/16/2023   K 5.1 12/16/2023   CO2 19 (L) 12/16/2023   GLUCOSE 81 12/16/2023   BUN 31 (H) 12/16/2023   CREATININE 1.62 (H) 12/16/2023   CALCIUM  11.5 (H) 12/16/2023   GFR 52.58 (L) 06/12/2013   EGFR 36 (L) 12/16/2023   GFRNONAA 41 (L) 06/21/2022      Latest Ref Rng & Units 12/16/2023   12:40 PM 06/21/2022    4:58 PM 06/30/2016    6:48 AM  BMP  Glucose 70 - 99 mg/dL 81  83  894   BUN 8 - 27 mg/dL 31  18  13    Creatinine 0.57 - 1.00 mg/dL 8.37  8.53  8.97   BUN/Creat Ratio 12 - 28 19     Sodium 134 - 144 mmol/L 141  140  137   Potassium 3.5 - 5.2 mmol/L 5.1  4.8  4.2   Chloride 96 - 106 mmol/L 101  102  105   CO2 20 - 29 mmol/L 19  31  24   Calcium  8.7 - 10.3 mg/dL 88.4  89.3  9.2     Labs 10/31/2023:  Total cholesterol 173, triglycerides 42, HDL 91, LDL 73.  ROS  Review of Systems  Cardiovascular:  Negative for chest pain, dyspnea on exertion and leg swelling.   Physical Exam:   VS:  BP 131/72 (BP Location: Left Arm, Patient Position: Sitting, Cuff Size: Large)   Pulse 64   Resp 16   Ht 4' 9 (1.448 m)   Wt 155 lb 6.4 oz (70.5 kg)   SpO2 98%   BMI 33.63 kg/m    Wt Readings from Last 3 Encounters:  12/16/23 155 lb 6.4 oz (70.5 kg)  10/18/23 158 lb (71.7 kg)  09/07/22 161 lb (73 kg)    BP Readings from Last 3 Encounters:   12/16/23 131/72  10/18/23 131/79  09/07/22 119/80   Physical Exam Neck:     Vascular: No carotid bruit or JVD.  Cardiovascular:     Rate and Rhythm: Normal rate and regular rhythm.     Pulses: Intact distal pulses.     Heart sounds: Normal heart sounds. No murmur heard.    No gallop.  Pulmonary:     Effort: Pulmonary effort is normal.     Breath sounds: Normal breath sounds.  Abdominal:     General: Bowel sounds are normal.     Palpations: Abdomen is soft.  Musculoskeletal:     Right lower leg: No edema.     Left lower leg: No edema.    EKG:         ASSESSMENT AND PLAN: .      ICD-10-CM   1. Non-ischemic cardiomyopathy (HCC)  I42.8     2. Chronic heart failure with mildly reduced ejection fraction (HFmrEF, 41-49%) (HCC)  I50.22     3. 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 Primary hypertension  I10      Assessment & Plan Nonischemic cardiomyopathy with HFmREF Heart function is slightly reduced but has remained stable over the last few years. Currently not in heart failure. On effective medications including Jardiance , metoprolol , hydralazine , and spironolactone . - Continue Jardiance , metoprolol  100 mg once daily, hydralazine  50 mg three times daily, and spironolactone  25 mg once daily. - Assist with Jardiance  assistance program to ensure continued access to medication (grant approved today). - Instruct to take furosemide  only if weight increases by 3 pounds, shortness of breath occurs, or legs swell.  Essential hypertension Managed with current medication regimen. Potential to reduce medication as weight loss continues. - Continue amlodipine  5 mg daily. - Monitor blood pressure and consider reducing medication if significant weight loss is achieved. - As I had made changes with the addition of Jardiance  and also reducing spironolactone  from 50 mg to 25 mg daily, we will obtain a baseline  BMP today.  Hyperlipidemia Well-controlled with current treatment. Recent labs show cholesterol levels are optimal after increasing Crestor  to 20 mg. - Continue current dose of Crestor . - Lipid profile from PCP reviewed, LDL is now at goal <100.  Obesity Recent weight loss of 48 pounds. Engaged in regular exercise and dietary changes. - Encourage continued weight loss through diet and exercise. - Monitor weight and adjust treatment plan as needed.  Tobacco use Currently not smoking for over a year. Quit smoking in May of the previous year. - Continue to abstain from smoking.  Follow up: 1 year and PRN if stable  Signed,  Gordy Bergamo, MD, [facility] 12/18/2023, 7:56 AM [facility] [address] [place], [place] [address] Phone: [phone]. Fax:  [phone]

## 2023-12-17 LAB — BASIC METABOLIC PANEL WITH GFR
BUN/Creatinine Ratio: 19 (ref 12–28)
BUN: 31 mg/dL — ABNORMAL HIGH (ref 8–27)
CO2: 19 mmol/L — ABNORMAL LOW (ref 20–29)
Calcium: 11.5 mg/dL — ABNORMAL HIGH (ref 8.7–10.3)
Chloride: 101 mmol/L (ref 96–106)
Creatinine, Ser: 1.62 mg/dL — ABNORMAL HIGH (ref 0.57–1.00)
Glucose: 81 mg/dL (ref 70–99)
Potassium: 5.1 mmol/L (ref 3.5–5.2)
Sodium: 141 mmol/L (ref 134–144)
eGFR: 36 mL/min/1.73 — ABNORMAL LOW (ref >59)

## 2023-12-18 ENCOUNTER — Ambulatory Visit: Payer: Self-pay | Admitting: Cardiology

## 2023-12-18 NOTE — Progress Notes (Signed)
 Stable renal function and potassium levels are normal. No need for any potassium supplement.

## 2023-12-19 NOTE — Telephone Encounter (Signed)
 Patient is returning call.

## 2023-12-26 DIAGNOSIS — N1832 Chronic kidney disease, stage 3b: Secondary | ICD-10-CM | POA: Diagnosis not present

## 2023-12-26 DIAGNOSIS — R7989 Other specified abnormal findings of blood chemistry: Secondary | ICD-10-CM | POA: Diagnosis not present

## 2023-12-26 DIAGNOSIS — K219 Gastro-esophageal reflux disease without esophagitis: Secondary | ICD-10-CM | POA: Diagnosis not present

## 2023-12-26 DIAGNOSIS — J45909 Unspecified asthma, uncomplicated: Secondary | ICD-10-CM | POA: Diagnosis not present

## 2023-12-26 DIAGNOSIS — G4700 Insomnia, unspecified: Secondary | ICD-10-CM | POA: Diagnosis not present

## 2023-12-26 DIAGNOSIS — I10 Essential (primary) hypertension: Secondary | ICD-10-CM | POA: Diagnosis not present

## 2023-12-26 DIAGNOSIS — Z0000 Encounter for general adult medical examination without abnormal findings: Secondary | ICD-10-CM | POA: Diagnosis not present

## 2023-12-26 DIAGNOSIS — M159 Polyosteoarthritis, unspecified: Secondary | ICD-10-CM | POA: Diagnosis not present

## 2024-01-03 ENCOUNTER — Telehealth: Payer: Self-pay | Admitting: Cardiology

## 2024-01-03 NOTE — Telephone Encounter (Signed)
 Patient has been approved for [employer]. I spoke with [employer] and gave them grant information.  Pharmacy ran grant information and confirmed cost would be $0 for patient. Patient notified

## 2024-01-03 NOTE — Telephone Encounter (Signed)
 Pt c/o medication issue:  1. Name of Medication:   empagliflozin  (JARDIANCE ) 10 MG TABS tablet   2. How are you currently taking this medication (dosage and times per day)?   As prescribed  3. Are you having a reaction (difficulty breathing--STAT)?   4. What is your medication issue?   Patient stated she went to pick up her refill and her pharmacy ([facility] [phone] - [place], [place] - [address] ) told her she would have to pay $47.00.  Patient stated she understood she would not have to pay for this medication and wants advice on next steps.

## 2024-01-30 ENCOUNTER — Other Ambulatory Visit: Payer: Self-pay | Admitting: Cardiology

## 2024-02-01 MED ORDER — EMPAGLIFLOZIN 10 MG PO TABS
10.0000 mg | ORAL_TABLET | Freq: Every day | ORAL | 3 refills | Status: AC
Start: 2024-02-01 — End: ?

## 2024-03-03 ENCOUNTER — Other Ambulatory Visit: Payer: Self-pay | Admitting: Cardiology

## 2024-03-03 DIAGNOSIS — E782 Mixed hyperlipidemia: Secondary | ICD-10-CM

## 2024-04-18 ENCOUNTER — Other Ambulatory Visit: Payer: Self-pay

## 2024-04-18 DIAGNOSIS — I10 Essential (primary) hypertension: Secondary | ICD-10-CM

## 2024-04-24 NOTE — Telephone Encounter (Signed)
 Creatine Outside Normal Range on 12/16/23  In accordance with refill protocols, please review and address the following requirements before this medication refill can be authorized:  Labs

## 2024-04-25 MED ORDER — SPIRONOLACTONE 25 MG PO TABS: 25.0000 mg | ORAL_TABLET | Freq: Every morning | ORAL | 1 refills | Status: AC
